# Patient Record
Sex: Male | Born: 1940 | Race: White | Hispanic: No | Smoking: Former smoker
Health system: Southern US, Community
[De-identification: ages and names within clinical notes are randomized; demographics above are authoritative.]

## PROBLEM LIST (undated history)

## (undated) DIAGNOSIS — I2510 Atherosclerotic heart disease of native coronary artery without angina pectoris: Secondary | ICD-10-CM

## (undated) DIAGNOSIS — E119 Type 2 diabetes mellitus without complications: Secondary | ICD-10-CM

## (undated) DIAGNOSIS — E785 Hyperlipidemia, unspecified: Secondary | ICD-10-CM

## (undated) DIAGNOSIS — I10 Essential (primary) hypertension: Secondary | ICD-10-CM

## (undated) DIAGNOSIS — I5030 Unspecified diastolic (congestive) heart failure: Secondary | ICD-10-CM

## (undated) HISTORY — PX: CORONARY ARTERY BYPASS GRAFT: SHX141

## (undated) HISTORY — PX: CATARACT EXTRACTION: SUR2

## (undated) HISTORY — PX: CHOLECYSTECTOMY: SHX55

## (undated) SURGERY — Surgical Case
Anesthesia: *Unknown

---

## 2006-06-08 ENCOUNTER — Emergency Department (HOSPITAL_COMMUNITY): Admission: EM | Admit: 2006-06-08 | Discharge: 2006-06-08 | Payer: Self-pay | Admitting: Emergency Medicine

## 2014-11-29 ENCOUNTER — Encounter: Admit: 2014-11-29 | Disposition: A | Discharge status: Home / Self Care | Payer: Self-pay

## 2014-12-26 ENCOUNTER — Encounter: Payer: Non-veteran care | Attending: General Practice | Admitting: *Deleted

## 2014-12-26 DIAGNOSIS — Z951 Presence of aortocoronary bypass graft: Secondary | ICD-10-CM | POA: Insufficient documentation

## 2014-12-26 NOTE — Progress Notes (Signed)
Daily Session Note  Patient Details  Name: Bradley [name] MRN: [id number] Date of Birth: 1940/12/20 [facility] Provider[facility]  Encounter Date: 12/26/2014  Check In:     Session Check In - 12/26/14 1126    Check-In   Staff Present Heath Lark RN, BSN, CCRP; Earlean Shawl BS, ACSM CEP, Exercise Physiologist; Candiss Norse MS, ACSM CEP, Exercise Physiologist   ER physicians immediately available to respond to emergencies See telemetry face sheet for immediately available ER MD   Warm-up and Cool-down Performed on first and last piece of equipment   VAD Patient? No   Pain Assessment   Currently in Pain? No/denies         Goals Met:  Independence with exercise equipment Exercise tolerated well  No cardiac symptoms reported with exercise.   Goals Unmet:  Not Applicable  Goals Comments:    Dr. Emily Filbert is Medical Director for [place] and [facility].

## 2014-12-28 ENCOUNTER — Encounter: Payer: Non-veteran care | Admitting: *Deleted

## 2014-12-28 DIAGNOSIS — Z951 Presence of aortocoronary bypass graft: Secondary | ICD-10-CM | POA: Diagnosis not present

## 2014-12-28 DIAGNOSIS — Z8249 Family history of ischemic heart disease and other diseases of the circulatory system: Secondary | ICD-10-CM

## 2014-12-28 NOTE — Progress Notes (Signed)
Daily Session Note  Patient Details  Name: Bradley [name] MRN: [id number] Date of Birth: 06-14-1941 Referring Provider:  [facility]  Encounter Date: 12/28/2014  Check In:     Session Check In - 12/28/14 1002    Check-In   Staff Present Heath Lark RN, BSN, CCRP; Remo Lipps Way BS, ACSM EP-C, Exercise Physiologist; Candiss Norse MS, ACSM CEP, Exercise Physiologist   ER physicians immediately available to respond to emergencies See telemetry face sheet for immediately available ER MD   Warm-up and Cool-down Performed on first and last piece of equipment   VAD Patient? No   Pain Assessment   Currently in Pain? No/denies   Multiple Pain Sites No         Goals Met:  Proper associated with RPD/PD & O2 Sat Independence with exercise equipment Exercise tolerated well  Goals Unmet:  Not Applicable  Goals Comments: No cardiac symptoms reported; daily exercise goals completed    Dr. Emily Filbert is Medical Director for [place] and [facility].

## 2015-01-15 ENCOUNTER — Encounter: Payer: Self-pay | Admitting: *Deleted

## 2015-01-15 NOTE — Progress Notes (Signed)
Documentation from previous EMR to update the Individualized Treatment Plan in [facility]. 

## 2015-01-17 ENCOUNTER — Encounter: Payer: Self-pay | Admitting: *Deleted

## 2015-01-17 NOTE — Progress Notes (Signed)
Cardiac Individual Treatment Plan  Patient Details  Name: Bradley [name] MRN: [id number] Date of Birth: [birth year] Referring Provider:  [facility] Initial Encounter Date:  11/29/2014 Diagnosis CABG  Patient's Home Medications on Admission: No current outpatient prescriptions on file.  Past Medical History: No past medical history on file.  Tobacco Use: History  Smoking status  . Not on file  Smokeless tobacco  . Not on file    Labs: Recent Review Flowsheet Data    There is no flowsheet data to display.       Exercise Target Goals:    Exercise Program Goal: Individual exercise prescription set with THRR, safety & activity barriers. Participant demonstrates ability to understand and report RPE using BORG scale, to self-measure pulse accurately, and to acknowledge the importance of the exercise prescription.  Exercise Prescription Goal: Starting with aerobic activity 30 plus minutes a day, 3 days per week for initial exercise prescription. Provide home exercise prescription and guidelines that participant acknowledges understanding prior to discharge.  Activity Barriers & Risk Stratification:   6 Minute Walk:   Initial Exercise Prescription:   Exercise Prescription Changes:     Exercise Prescription Changes      01/17/15 0700           Exercise Review   Progression No  Absent since last review          Discharge Exercise Prescription:   Nutrition:  Target Goals: Understanding of nutrition guidelines, daily intake of sodium 1500mg , cholesterol 200mg , calories 30% from fat and 7% or less from saturated fats, daily to have 5 or more servings of fruits and vegetables.  Biometrics:    Nutrition Therapy Plan and Nutrition Goals:   Nutrition Discharge: Rate Your Plate Scores:   Nutrition Goals Re-Evaluation:   Psychosocial: Target Goals: Acknowledge presence or absence of depression, maximize coping skills, provide positive support system. Participant  is able to verbalize types and ability to use techniques and skills needed for reducing stress and depression.  Initial Review & Psychosocial Screening:   Quality of Life Scores:   PHQ-9:     Recent Review Flowsheet Data    There is no flowsheet data to display.      Psychosocial Evaluation and Intervention:   Psychosocial Re-Evaluation:   Vocational Rehabilitation: Provide vocational rehab assistance to qualifying candidates.   Vocational Rehab Evaluation & Intervention:   Education: Education Goals: Education classes will be provided on a weekly basis, covering required topics. Participant will state understanding/return demonstration of topics presented.  Learning Barriers/Preferences:   Education Topics: General Nutrition Guidelines/Fats and Fiber: -Group instruction provided by verbal, written material, models and posters to present the general guidelines for heart healthy nutrition. Gives an explanation and review of dietary fats and fiber.   Controlling Sodium/Reading Food Labels: -Group verbal and written material supporting the discussion of sodium use in heart healthy nutrition. Review and explanation with models, verbal and written materials for utilization of the food label.   Exercise Physiology & Risk Factors: - Group verbal and written instruction with models to review the exercise physiology of the cardiovascular system and associated critical values. Details cardiovascular disease risk factors and the goals associated with each risk factor.   Aerobic Exercise & Resistance Training: - Gives group verbal and written discussion on the health impact of inactivity. On the components of aerobic and resistive training programs and the benefits of this training and how to safely progress through these programs.   Flexibility, Balance, General Exercise Guidelines: - Provides group  verbal and written instruction on the benefits of flexibility and balance  training programs. Provides general exercise guidelines with specific guidelines to those with heart or lung disease. Demonstration and skill practice provided.   Stress Management: - Provides group verbal and written instruction about the health risks of elevated stress, cause of high stress, and healthy ways to reduce stress.   Depression: - Provides group verbal and written instruction on the correlation between heart/lung disease and depressed mood, treatment options, and the stigmas associated with seeking treatment.   Anatomy & Physiology of the Heart: - Group verbal and written instruction and models provide basic cardiac anatomy and physiology, with the coronary electrical and arterial systems. Review of: AMI, Angina, Valve disease, Heart Failure, Cardiac Arrhythmia, Pacemakers, and the ICD.   Cardiac Procedures: - Group verbal and written instruction and models to describe the testing methods done to diagnose heart disease. Reviews the outcomes of the test results. Describes the treatment choices: Medical Management, Angioplasty, or Coronary Bypass Surgery.   Cardiac Medications: - Group verbal and written instruction to review commonly prescribed medications for heart disease. Reviews the medication, class of the drug, and side effects. Includes the steps to properly store meds and maintain the prescription regimen.   Go Sex-Intimacy & Heart Disease, Get SMART - Goal Setting: - Group verbal and written instruction through game format to discuss heart disease and the return to sexual intimacy. Provides group verbal and written material to discuss and apply goal setting through the application of the S.M.A.R.T. Method.   Other Matters of the Heart: - Provides group verbal, written materials and models to describe Heart Failure, Angina, Valve Disease, and Diabetes in the realm of heart disease. Includes description of the disease process and treatment options available to the  cardiac patient.   Exercise & Equipment Safety: - Individual verbal instruction and demonstration of equipment use and safety with use of the equipment.   Infection Prevention: - Provides verbal and written material to individual with discussion of infection control including proper hand washing and proper equipment cleaning during exercise session.   Falls Prevention: - Provides verbal and written material to individual with discussion of falls prevention and safety.   Diabetes: - Individual verbal and written instruction to review signs/symptoms of diabetes, desired ranges of glucose level fasting, after meals and with exercise. Advice that pre and post exercise glucose checks will be done for 3 sessions at entry of program.    Knowledge Questionnaire Score:   Personal Goals and Risk Factors at Admission:   Personal Goals and Risk Factors Review:    Personal Goals Discharge:     Comments: 30 day review  [facility]

## 2015-01-19 ENCOUNTER — Other Ambulatory Visit: Payer: Self-pay | Admitting: *Deleted

## 2015-01-19 DIAGNOSIS — Z951 Presence of aortocoronary bypass graft: Secondary | ICD-10-CM

## 2015-01-25 ENCOUNTER — Encounter: Payer: Non-veteran care | Attending: General Practice

## 2015-01-25 DIAGNOSIS — Z951 Presence of aortocoronary bypass graft: Secondary | ICD-10-CM | POA: Insufficient documentation

## 2015-02-13 ENCOUNTER — Encounter: Payer: Self-pay | Admitting: *Deleted

## 2015-02-14 ENCOUNTER — Encounter: Payer: Self-pay | Admitting: *Deleted

## 2015-02-14 DIAGNOSIS — Z951 Presence of aortocoronary bypass graft: Secondary | ICD-10-CM

## 2015-02-14 NOTE — Progress Notes (Signed)
Cardiac Individual Treatment Plan  Patient Details  Name: Bradley [name] MRN: [id number] Date of Birth: 1941/02/19 Referring Provider:  [facility] physician   Initial Encounter Date:   12/09/2014 Visit Diagnosis: S/P CABG (coronary artery bypass graft)  Patient's Home Medications on Admission: No current outpatient prescriptions on file.  Past Medical History: No past medical history on file.  Tobacco Use: History  Smoking status  . Not on file  Smokeless tobacco  . Not on file    Labs: Recent Review Flowsheet Data    There is no flowsheet data to display.       Exercise Target Goals:    Exercise Program Goal: Individual exercise prescription set with THRR, safety & activity barriers. Participant demonstrates ability to understand and report RPE using BORG scale, to self-measure pulse accurately, and to acknowledge the importance of the exercise prescription.  Exercise Prescription Goal: Starting with aerobic activity 30 plus minutes a day, 3 days per week for initial exercise prescription. Provide home exercise prescription and guidelines that participant acknowledges understanding prior to discharge.  Activity Barriers & Risk Stratification:     Activity Barriers & Risk Stratification - 01/15/15 1316    Activity Barriers & Risk Stratification   Activity Barriers Back Problems;Other (comment)   Comments Disc that cause numbness at times      6 Minute Walk:     6 Minute Walk      11/29/14 1317       6 Minute Walk   Phase Initial     Distance 1100 feet     Walk Time 5 minutes     Resting HR 59 bpm     Resting BP 140/68 mmHg     Max Ex. HR 90 bpm     Max Ex. BP 162/78 mmHg     RPE 11     Symptoms Yes (comment)     Comments No cardiac symptoms, had to stop and rest due to back pain  walk 5 min rested        Initial Exercise Prescription:   Exercise Prescription Changes:     Exercise Prescription Changes      02/13/15 1600           Exercise  Review   Progression No  Absent since last review          Discharge Exercise Prescription (Final Exercise Prescription Changes):     Exercise Prescription Changes - 02/13/15 1600    Exercise Review   Progression No  Absent since last review      Nutrition:  Target Goals: Understanding of nutrition guidelines, daily intake of sodium 1500mg , cholesterol 200mg , calories 30% from fat and 7% or less from saturated fats, daily to have 5 or more servings of fruits and vegetables.  Biometrics:     Pre Biometrics - 11/29/14 1320    Pre Biometrics   Height 5' 10.5" (1.791 m)   Weight 214 lb 12.8 oz (97.433 kg)   Waist Circumference 43 inches   Hip Circumference 40 inches   Waist to Hip Ratio 1.08 %   BMI (Calculated) 30.4       Nutrition Therapy Plan and Nutrition Goals:     Nutrition Therapy & Goals - 12/09/14 1318    Nutrition Therapy   Diet 1700 calories ADA   Drug/Food Interactions Statins/Certain Fruits   Fiber 30 grams   Whole Grain Foods 3 servings   Protein 8 ounces/day   Saturated Fats 11 max. grams   Fruits  and Vegetables 5 servings/day   Personal Nutrition Goals   Personal Goal #1 Increase intake of whole grains with oatmeal and choosing brown rice, corn, whole wheat bread when available "out"   Personal Goal #2 Read labels for saturated and trans fats      Nutrition Discharge: Rate Your Plate Scores:   Nutrition Goals Re-Evaluation:   Psychosocial: Target Goals: Acknowledge presence or absence of depression, maximize coping skills, provide positive support system. Participant is able to verbalize types and ability to use techniques and skills needed for reducing stress and depression.  Initial Review & Psychosocial Screening:   Quality of Life Scores:   PHQ-9:     Recent Review Flowsheet Data    There is no flowsheet data to display.      Psychosocial Evaluation and Intervention:   Psychosocial Re-Evaluation:   Vocational  Rehabilitation: Provide vocational rehab assistance to qualifying candidates.   Vocational Rehab Evaluation & Intervention:     Vocational Rehab - 01/15/15 1317    Initial Vocational Rehab Evaluation & Intervention   Assessment shows need for Vocational Rehabilitation No      Education: Education Goals: Education classes will be provided on a weekly basis, covering required topics. Participant will state understanding/return demonstration of topics presented.  Learning Barriers/Preferences:     Learning Barriers/Preferences - 01/15/15 1316    Learning Barriers/Preferences   Learning Barriers None   Learning Preferences None      Education Topics: General Nutrition Guidelines/Fats and Fiber: -Group instruction provided by verbal, written material, models and posters to present the general guidelines for heart healthy nutrition. Gives an explanation and review of dietary fats and fiber.   Controlling Sodium/Reading Food Labels: -Group verbal and written material supporting the discussion of sodium use in heart healthy nutrition. Review and explanation with models, verbal and written materials for utilization of the food label.   Exercise Physiology & Risk Factors: - Group verbal and written instruction with models to review the exercise physiology of the cardiovascular system and associated critical values. Details cardiovascular disease risk factors and the goals associated with each risk factor.   Aerobic Exercise & Resistance Training: - Gives group verbal and written discussion on the health impact of inactivity. On the components of aerobic and resistive training programs and the benefits of this training and how to safely progress through these programs.   Flexibility, Balance, General Exercise Guidelines: - Provides group verbal and written instruction on the benefits of flexibility and balance training programs. Provides general exercise guidelines with specific  guidelines to those with heart or lung disease. Demonstration and skill practice provided.   Stress Management: - Provides group verbal and written instruction about the health risks of elevated stress, cause of high stress, and healthy ways to reduce stress.   Depression: - Provides group verbal and written instruction on the correlation between heart/lung disease and depressed mood, treatment options, and the stigmas associated with seeking treatment.   Anatomy & Physiology of the Heart: - Group verbal and written instruction and models provide basic cardiac anatomy and physiology, with the coronary electrical and arterial systems. Review of: AMI, Angina, Valve disease, Heart Failure, Cardiac Arrhythmia, Pacemakers, and the ICD.   Cardiac Procedures: - Group verbal and written instruction and models to describe the testing methods done to diagnose heart disease. Reviews the outcomes of the test results. Describes the treatment choices: Medical Management, Angioplasty, or Coronary Bypass Surgery.   Cardiac Medications: - Group verbal and written instruction to review commonly  prescribed medications for heart disease. Reviews the medication, class of the drug, and side effects. Includes the steps to properly store meds and maintain the prescription regimen.   Go Sex-Intimacy & Heart Disease, Get SMART - Goal Setting: - Group verbal and written instruction through game format to discuss heart disease and the return to sexual intimacy. Provides group verbal and written material to discuss and apply goal setting through the application of the S.M.A.R.T. Method.   Other Matters of the Heart: - Provides group verbal, written materials and models to describe Heart Failure, Angina, Valve Disease, and Diabetes in the realm of heart disease. Includes description of the disease process and treatment options available to the cardiac patient.   Exercise & Equipment Safety: - Individual verbal  instruction and demonstration of equipment use and safety with use of the equipment.   Infection Prevention: - Provides verbal and written material to individual with discussion of infection control including proper hand washing and proper equipment cleaning during exercise session.   Falls Prevention: - Provides verbal and written material to individual with discussion of falls prevention and safety.   Diabetes: - Individual verbal and written instruction to review signs/symptoms of diabetes, desired ranges of glucose level fasting, after meals and with exercise. Advice that pre and post exercise glucose checks will be done for 3 sessions at entry of program.    Knowledge Questionnaire Score:   Personal Goals and Risk Factors at Admission:     Personal Goals and Risk Factors at Admission - 01/15/15 1321    Personal Goals and Risk Factors on Admission   Take Less Medication Yes   Intervention Learn your risk factors and begin the lifestyle modifications for risk factor control during your time in the program.   Diabetes Yes   Goal Blood glucose control identified by blood glucose values, HgbA1C. Participant verbalizes understanding of the signs/symptoms of hyper/hypo glycemia, proper foot care and importance of medication and nutrition plan for blood glucose control.   Intervention Provide nutrition & aerobic exercise along with prescribed medications to achieve blood glucose in normal ranges: Fasting 65-99 mg/dL   Hypertension Yes   Goal Participant will see blood pressure controlled within the values of 140/21mm/Hg or within value directed by their physician.   Intervention Provide nutrition & aerobic exercise along with prescribed medications to achieve BP 140/90 or less.   Lipids Yes   Goal Cholesterol controlled with medications as prescribed, with individualized exercise RX and with personalized nutrition plan. Value goals: LDL < 70mg , HDL > 40mg . Participant states  understanding of desired cholesterol values and following prescriptions.   Intervention Provide nutrition & aerobic exercise along with prescribed medications to achieve LDL 70mg , HDL >40mg .   Stress No      Personal Goals and Risk Factors Review:    Personal Goals Discharge:     Comments: 30 day review. Continue with ITP.   Bradley [name] is out waiting for authorization from [facility] for more visits.

## 2015-02-15 ENCOUNTER — Other Ambulatory Visit: Payer: Self-pay | Admitting: *Deleted

## 2015-02-15 DIAGNOSIS — Z951 Presence of aortocoronary bypass graft: Secondary | ICD-10-CM

## 2015-02-22 DIAGNOSIS — Z951 Presence of aortocoronary bypass graft: Secondary | ICD-10-CM

## 2015-02-22 NOTE — Progress Notes (Signed)
Daily Session Note  Patient Details  Name: Markail Diekman MRN: [id number] Date of Birth: Nov 10, 1940 [facility] Provider[facility]  [facility]  Encounter Date: 02/22/2015  Check In:     Session Check In - 02/22/15 0843    Check-In   Staff Present Heath Lark RN, BSN, CCRP;Izael Bessinger BS, ACSM EP-C, Exercise Physiologist;Renee Dillard Essex MS, ACSM CEP Exercise Physiologist   ER physicians immediately available to respond to emergencies See telemetry face sheet for immediately available ER MD   Medication changes reported     No   Fall or balance concerns reported    No   Warm-up and Cool-down Performed on first and last piece of equipment   VAD Patient? No   Pain Assessment   Currently in Pain? No/denies         Goals Met:  Proper associated with RPD/PD & O2 Sat Exercise tolerated well Personal goals reviewed Strength training completed today  Goals Unmet:  Not Applicable  Goals Comments:   Dr. Emily Filbert is Medical Director for [place] and [facility].

## 2015-02-24 ENCOUNTER — Encounter: Payer: Non-veteran care | Attending: General Practice | Admitting: *Deleted

## 2015-02-24 DIAGNOSIS — Z951 Presence of aortocoronary bypass graft: Secondary | ICD-10-CM

## 2015-02-24 NOTE — Progress Notes (Signed)
Daily Session Note  Patient Details  Name: Bradley [name] MRN: [id number] Date of Birth: 04/23/1941 Referring Provider:  [facility]  Encounter Date: 02/24/2015  Check In:     Session Check In - 02/24/15 0911    Check-In   Staff Present Heath Lark RN, BSN, CCRP;Renee Dillard Essex MS, ACSM CEP Exercise Physiologist;Carroll Enterkin RN, BSN   ER physicians immediately available to respond to emergencies See telemetry face sheet for immediately available ER MD   Medication changes reported     No   Fall or balance concerns reported    No   Warm-up and Cool-down Performed on first and last piece of equipment   VAD Patient? No   Pain Assessment   Currently in Pain? No/denies         Goals Met:  Independence with exercise equipment Exercise tolerated well No report of cardiac concerns or symptoms Strength training completed today  Goals Unmet:  Not Applicable  Goals Comments: Working well with exercise progression.   Dr. Emily Filbert is Medical Director for [place] and [facility].

## 2015-02-28 ENCOUNTER — Encounter: Payer: Self-pay | Admitting: *Deleted

## 2015-03-01 DIAGNOSIS — Z951 Presence of aortocoronary bypass graft: Secondary | ICD-10-CM | POA: Diagnosis not present

## 2015-03-01 NOTE — Progress Notes (Signed)
Daily Session Note  Patient Details  Name: Bradley [name] MRN: [id number] Date of Birth: 1941-01-05 Ref[facility] Provider:  [facility]  Encounter Date: 03/01/2015  Check In:     Session Check In - 03/01/15 2761    Check-In   Staff Present Heath Lark RN, BSN, CCRP;Kyndra Condron BS, ACSM EP-C, Exercise Physiologist;Renee Dillard Essex MS, ACSM CEP Exercise Physiologist   ER physicians immediately available to respond to emergencies See telemetry face sheet for immediately available ER MD   Medication changes reported     No   Fall or balance concerns reported    No   Warm-up and Cool-down Performed on first and last piece of equipment   VAD Patient? No   Pain Assessment   Currently in Pain? No/denies         Goals Met:  Proper associated with RPD/PD & O2 Sat Exercise tolerated well No report of cardiac concerns or symptoms Strength training completed today  Goals Unmet:  Not Applicable  Goals Comments:    Dr. Emily Filbert is Medical Director for [place] and [facility].

## 2015-03-03 ENCOUNTER — Encounter: Payer: Non-veteran care | Admitting: *Deleted

## 2015-03-03 DIAGNOSIS — Z951 Presence of aortocoronary bypass graft: Secondary | ICD-10-CM | POA: Diagnosis not present

## 2015-03-03 NOTE — Progress Notes (Signed)
Daily Session Note  Patient Details  Name: Bradley [name] MRN: [id number] Date of Birth: 1941-07-24 Referring Provider:  [facility]  Encounter Date: 03/03/2015  Check In:     Session Check In - 03/03/15 0900    Check-In   Staff Present Diane Joya Gaskins RN, BSN;Stacey Blanch Media RRT, RCP Respiratory Therapist;Celia Gibbons Dillard Essex MS, ACSM CEP Exercise Physiologist   ER physicians immediately available to respond to emergencies See telemetry face sheet for immediately available ER MD   Medication changes reported     No   Fall or balance concerns reported    No   Warm-up and Cool-down Performed on first and last piece of equipment   VAD Patient? No   Pain Assessment   Currently in Pain? No/denies   Multiple Pain Sites No         Goals Met:  Independence with exercise equipment Exercise tolerated well Personal goals reviewed No report of cardiac concerns or symptoms Strength training completed today  Goals Unmet:  Not Applicable  Goals Comments:   Dr. Emily Filbert is Medical Director for [place] and [facility].

## 2015-03-06 ENCOUNTER — Encounter: Payer: Non-veteran care | Admitting: *Deleted

## 2015-03-06 DIAGNOSIS — Z951 Presence of aortocoronary bypass graft: Secondary | ICD-10-CM | POA: Diagnosis not present

## 2015-03-06 NOTE — Progress Notes (Signed)
Daily Session Note  Patient Details  Name: Bradley [name] MRN: [id number] Date of Birth: [birth year] Referring Provider:  [facility] [place] [facility]  Encounter Date: 03/06/2015  Check In:     Session Check In - 03/06/15 0827    Check-In   Staff Present Candiss Norse MS, ACSM CEP Exercise Physiologist;Eliezer Khawaja Alfonso Patten, ACSM CEP Exercise Physiologist;Susanne Bice RN, BSN, [place]   ER physicians immediately available to respond to emergencies See telemetry face sheet for immediately available ER MD   Medication changes reported     No   Fall or balance concerns reported    No   Warm-up and Cool-down Performed on first and last piece of equipment   VAD Patient? No   Pain Assessment   Currently in Pain? No/denies   Multiple Pain Sites No         Goals Met:  Independence with exercise equipment Exercise tolerated well No report of cardiac concerns or symptoms  Goals Unmet:  Not Applicable  Goals Comments:    Dr. Emily Filbert is Medical Director for [place] and [facility].

## 2015-03-08 DIAGNOSIS — Z951 Presence of aortocoronary bypass graft: Secondary | ICD-10-CM

## 2015-03-08 NOTE — Progress Notes (Signed)
Daily Session Note  Patient Details  Name: Nayef College MRN: [id number] Date of Birth: 10/25/1940 Referring Provider:  [facility] [place] [facility]  Encounter Date: 03/08/2015  Check In:     Session Check In - 03/08/15 0827    Check-In   Staff Present Heath Lark RN, BSN, CCRP;Davionne Dowty BS, ACSM EP-C, Exercise Physiologist;Renee Dillard [place] MS, ACSM CEP Exercise Physiologist   ER physicians immediately available to respond to emergencies See telemetry face sheet for immediately available ER MD   Medication changes reported     No   Fall or balance concerns reported    No   Warm-up and Cool-down Performed on first and last piece of equipment   VAD Patient? No   Pain Assessment   Currently in Pain? No/denies         Goals Met:  Proper associated with RPD/PD & O2 Sat Exercise tolerated well No report of cardiac concerns or symptoms Strength training completed today  Goals Unmet:  Not Applicable  Goals Comments:    Dr. Emily Filbert is Medical Director for [place] and [facility].

## 2015-03-10 ENCOUNTER — Encounter: Payer: Non-veteran care | Admitting: *Deleted

## 2015-03-10 DIAGNOSIS — Z951 Presence of aortocoronary bypass graft: Secondary | ICD-10-CM | POA: Diagnosis not present

## 2015-03-10 NOTE — Progress Notes (Signed)
Daily Session Note  Patient Details  Name: Bradley [name] MRN: [id number] Date of Birth: 1941-04-08 Referring Provider:  [facility] [place] [facility]  Encounter Date: 03/10/2015  Check In:     Session Check In - 03/10/15 7215    Check-In   Staff Present Heath Lark RN, BSN, CCRP;Carroll Enterkin RN, Drusilla Kanner MS, ACSM CEP Exercise Physiologist   ER physicians immediately available to respond to emergencies See telemetry face sheet for immediately available ER MD   Medication changes reported     No   Fall or balance concerns reported    No   Warm-up and Cool-down Performed on first and last piece of equipment   VAD Patient? No   Pain Assessment   Currently in Pain? No/denies         Goals Met:  Independence with exercise equipment Exercise tolerated well No report of cardiac concerns or symptoms Strength training completed today  Goals Unmet:  Not Applicable  Goals Comments: JW continues to do well with the exercise. He is limited because of of a "bad back".   Dr. Emily Filbert is Medical Director for [place] and [facility].

## 2015-03-13 DIAGNOSIS — Z951 Presence of aortocoronary bypass graft: Secondary | ICD-10-CM | POA: Diagnosis not present

## 2015-03-13 NOTE — Progress Notes (Signed)
Daily Session Note  Patient Details  Name: Bradley [name] MRN: [id number] Date of Birth: [birth year] Referring Provider:  [facility] [facility]  Encounter Date: 03/13/2015  Check In:     Session Check In - 03/13/15 1153    Check-In   Staff Present Heath Lark RN, BSN, CCRP;Kelly Hayes BS, ACSM CEP Exercise Physiologist;Renee Covington MS, ACSM CEP Exercise Physiologist   ER physicians immediately available to respond to emergencies See telemetry face sheet for immediately available ER MD   Medication changes reported     No   Fall or balance concerns reported    No   Warm-up and Cool-down Performed on first and last piece of equipment   VAD Patient? No   Pain Assessment   Currently in Pain? No/denies           Exercise Prescription Changes - 03/13/15 1100    Interval Training   Interval Training Yes   Equipment REL-XR   Comments L6-7 80-110w      Goals Met:  Independence with exercise equipment Exercise tolerated well No report of cardiac concerns or symptoms Strength training completed today  Goals Unmet:  Not Applicable  Goals Comments: Progressing well with his exercise prescription.    Dr. Emily Filbert is Medical Director for [place] and [facility].

## 2015-03-14 ENCOUNTER — Encounter: Payer: Self-pay | Admitting: *Deleted

## 2015-03-14 DIAGNOSIS — Z951 Presence of aortocoronary bypass graft: Secondary | ICD-10-CM

## 2015-03-14 NOTE — Progress Notes (Signed)
Cardiac Individual Treatment Plan  Patient Details  Name: Bradley [name] MRN: [id number] Date of Birth: Jan 02, 1941 Referring Provider:  No ref. provider found  Initial Encounter Date:    Visit Diagnosis: S/P CABG x 3  Patient's Home Medications on Admission: No current outpatient prescriptions on file.  Past Medical History: No past medical history on file.  Tobacco Use: History  Smoking status  . Not on file  Smokeless tobacco  . Not on file    Labs: Recent Review Flowsheet Data    There is no flowsheet data to display.       Exercise Target Goals:    Exercise Program Goal: Individual exercise prescription set with THRR, safety & activity barriers. Participant demonstrates ability to understand and report RPE using BORG scale, to self-measure pulse accurately, and to acknowledge the importance of the exercise prescription.  Exercise Prescription Goal: Starting with aerobic activity 30 plus minutes a day, 3 days per week for initial exercise prescription. Provide home exercise prescription and guidelines that participant acknowledges understanding prior to discharge.  Activity Barriers & Risk Stratification:   6 Minute Walk:   Initial Exercise Prescription:   Exercise Prescription Changes:     Exercise Prescription Changes      01/17/15 0700 03/13/15 1100 03/14/15 1300       Exercise Review   Progression No  Absent since last review  Yes     Response to Exercise   Blood Pressure (Admit)   150/80 mmHg     Blood Pressure (Exercise)   172/80 mmHg     Blood Pressure (Exit)   140/84 mmHg     Heart Rate (Admit)   81 bpm     Heart Rate (Exercise)   117 bpm     Heart Rate (Exit)   65 bpm     Rating of Perceived Exertion (Exercise)   13     Duration   Progress to 30 minutes of continuous aerobic without signs/symptoms of physical distress     Intensity   Rest + 30     Progression   Continue progressive overload as per policy without signs/symptoms or physical  distress.     Interval Training   Interval Training  Yes Yes     Equipment  REL-XR REL-XR     Comments  L6-7 80-110w L6-7 80-110w     REL-XR   Level   7     Watts   110     Minutes   30        Discharge Exercise Prescription (Final Exercise Prescription Changes):     Exercise Prescription Changes - 03/14/15 1300    Exercise Review   Progression Yes   Response to Exercise   Blood Pressure (Admit) 150/80 mmHg   Blood Pressure (Exercise) 172/80 mmHg   Blood Pressure (Exit) 140/84 mmHg   Heart Rate (Admit) 81 bpm   Heart Rate (Exercise) 117 bpm   Heart Rate (Exit) 65 bpm   Rating of Perceived Exertion (Exercise) 13   Duration Progress to 30 minutes of continuous aerobic without signs/symptoms of physical distress   Intensity Rest + 30   Progression Continue progressive overload as per policy without signs/symptoms or physical distress.   Interval Training   Interval Training Yes   Equipment REL-XR   Comments L6-7 80-110w   REL-XR   Level 7   Watts 110   Minutes 30      Nutrition:  Target Goals: Understanding of nutrition guidelines, daily intake of sodium <  1500mg , cholesterol 200mg , calories 30% from fat and 7% or less from saturated fats, daily to have 5 or more servings of fruits and vegetables.  Biometrics:    Nutrition Therapy Plan and Nutrition Goals:     Nutrition Therapy & Goals - 03/03/15 0943    Personal Nutrition Goals   Personal Goal #1 Has started eating more salads and is more conscious of fat and sodium in the diet      Nutrition Discharge: Rate Your Plate Scores:   Nutrition Goals Re-Evaluation:   Psychosocial: Target Goals: Acknowledge presence or absence of depression, maximize coping skills, provide positive support system. Participant is able to verbalize types and ability to use techniques and skills needed for reducing stress and depression.  Initial Review & Psychosocial Screening:   Quality of Life Scores:   PHQ-9:      Recent Review Flowsheet Data    There is no flowsheet data to display.      Psychosocial Evaluation and Intervention:   Psychosocial Re-Evaluation:   Vocational Rehabilitation: Provide vocational rehab assistance to qualifying candidates.   Vocational Rehab Evaluation & Intervention:   Education: Education Goals: Education classes will be provided on a weekly basis, covering required topics. Participant will state understanding/return demonstration of topics presented.  Learning Barriers/Preferences:   Education Topics: General Nutrition Guidelines/Fats and Fiber: -Group instruction provided by verbal, written material, models and posters to present the general guidelines for heart healthy nutrition. Gives an explanation and review of dietary fats and fiber.   Controlling Sodium/Reading Food Labels: -Group verbal and written material supporting the discussion of sodium use in heart healthy nutrition. Review and explanation with models, verbal and written materials for utilization of the food label.   Exercise Physiology & Risk Factors: - Group verbal and written instruction with models to review the exercise physiology of the cardiovascular system and associated critical values. Details cardiovascular disease risk factors and the goals associated with each risk factor.      Cardiac Rehab from 03/13/2015 in [facility] Cardiac Rehab   Date  03/08/15   Educator  RM   Instruction Review Code  2- meets goals/outcomes      Aerobic Exercise & Resistance Training: - Gives group verbal and written discussion on the health impact of inactivity. On the components of aerobic and resistive training programs and the benefits of this training and how to safely progress through these programs.      Cardiac Rehab from 03/13/2015 in [facility] Cardiac Rehab   Date  03/13/15   Educator  rm   Instruction Review Code  2- meets goals/outcomes      Flexibility, Balance, General Exercise Guidelines: -  Provides group verbal and written instruction on the benefits of flexibility and balance training programs. Provides general exercise guidelines with specific guidelines to those with heart or lung disease. Demonstration and skill practice provided.   Stress Management: - Provides group verbal and written instruction about the health risks of elevated stress, cause of high stress, and healthy ways to reduce stress.   Depression: - Provides group verbal and written instruction on the correlation between heart/lung disease and depressed mood, treatment options, and the stigmas associated with seeking treatment.      Cardiac Rehab from 03/13/2015 in [facility] Cardiac Rehab   Date  02/22/15   Educator  [facility]   Instruction Review Code  2- meets goals/outcomes      Anatomy & Physiology of the Heart: - Group verbal and written instruction and models provide basic  cardiac anatomy and physiology, with the coronary electrical and arterial systems. Review of: AMI, Angina, Valve disease, Heart Failure, Cardiac Arrhythmia, Pacemakers, and the ICD.   Cardiac Procedures: - Group verbal and written instruction and models to describe the testing methods done to diagnose heart disease. Reviews the outcomes of the test results. Describes the treatment choices: Medical Management, Angioplasty, or Coronary Bypass Surgery.      Cardiac Rehab from 03/13/2015 in [facility] Cardiac Rehab   Date  03/01/15   Educator  SB   Instruction Review Code  2- meets goals/outcomes      Cardiac Medications: - Group verbal and written instruction to review commonly prescribed medications for heart disease. Reviews the medication, class of the drug, and side effects. Includes the steps to properly store meds and maintain the prescription regimen.      Cardiac Rehab from 03/13/2015 in [facility] Cardiac Rehab   Date  03/06/15   Educator  SB   Instruction Review Code  2- meets goals/outcomes      Go Sex-Intimacy & Heart Disease, Get SMART -  Goal Setting: - Group verbal and written instruction through game format to discuss heart disease and the return to sexual intimacy. Provides group verbal and written material to discuss and apply goal setting through the application of the S.M.A.R.T. Method.      Cardiac Rehab from 03/13/2015 in [facility] Cardiac Rehab   Date  03/01/15   Educator  SB   Instruction Review Code  2- meets goals/outcomes      Other Matters of the Heart: - Provides group verbal, written materials and models to describe Heart Failure, Angina, Valve Disease, and Diabetes in the realm of heart disease. Includes description of the disease process and treatment options available to the cardiac patient.   Exercise & Equipment Safety: - Individual verbal instruction and demonstration of equipment use and safety with use of the equipment.   Infection Prevention: - Provides verbal and written material to individual with discussion of infection control including proper hand washing and proper equipment cleaning during exercise session.   Falls Prevention: - Provides verbal and written material to individual with discussion of falls prevention and safety.   Diabetes: - Individual verbal and written instruction to review signs/symptoms of diabetes, desired ranges of glucose level fasting, after meals and with exercise. Advice that pre and post exercise glucose checks will be done for 3 sessions at entry of program.    Knowledge Questionnaire Score:   Personal Goals and Risk Factors at Admission:   Personal Goals and Risk Factors Review:      Goals and Risk Factor Review      03/03/15 0943           Increase Aerobic Exercise and Physical Activity   Goals Progress/Improvement seen  Yes       Comments Has increased levels on the reucmbent elliptical, cannot do certain machines because of his back. Feels stronger.        Understand more about Heart/Pulmonary Disease   Goals Progress/Improvement seen  Yes        Comments Education has been very helpful, is really enjoying the program          Personal Goals Discharge:     Comments: 30 day review. Continue with ITP.

## 2015-03-15 ENCOUNTER — Other Ambulatory Visit: Payer: Self-pay | Admitting: *Deleted

## 2015-03-15 DIAGNOSIS — Z951 Presence of aortocoronary bypass graft: Secondary | ICD-10-CM

## 2015-03-15 NOTE — Progress Notes (Signed)
Daily Session Note  Patient Details  Name: Bradley [name] MRN: [id number] Date of Birth: 1941-07-02 Referring Provider:  [facility] [place] [facility]  Encounter Date: 03/15/2015  Check In:     Session Check In - 03/15/15 0900    Check-In   Staff Present Candiss Norse MS, ACSM CEP Exercise Physiologist;Steven Way BS, ACSM EP-C, Exercise Physiologist;Carroll Enterkin RN, BSN   ER physicians immediately available to respond to emergencies See telemetry face sheet for immediately available ER MD   Medication changes reported     No   Fall or balance concerns reported    No   Warm-up and Cool-down Performed on first and last piece of equipment   VAD Patient? No   Pain Assessment   Currently in Pain? No/denies   Multiple Pain Sites No           Exercise Prescription Changes - 03/14/15 1300    Exercise Review   Progression Yes   Response to Exercise   Blood Pressure (Admit) 150/80 mmHg   Blood Pressure (Exercise) 172/80 mmHg   Blood Pressure (Exit) 140/84 mmHg   Heart Rate (Admit) 81 bpm   Heart Rate (Exercise) 117 bpm   Heart Rate (Exit) 65 bpm   Rating of Perceived Exertion (Exercise) 13   Duration Progress to 30 minutes of continuous aerobic without signs/symptoms of physical distress   Intensity Rest + 30   Progression Continue progressive overload as per policy without signs/symptoms or physical distress.   Interval Training   Interval Training Yes   Equipment REL-XR   Comments L6-7 80-110w   REL-XR   Level 7   Watts 110   Minutes 30      Goals Met:  Independence with exercise equipment Exercise tolerated well Personal goals reviewed No report of cardiac concerns or symptoms Strength training completed today  Goals Unmet:  Not Applicable  Goals Comments: [name] Was feeling more energetic today and went up on his XR intervals to Level 5-6. He said he could tell a difference in the difficulty but it was manageable and it felt good to work hard.    Dr. Emily Filbert is Medical Director for [place] and [facility].

## 2015-03-17 ENCOUNTER — Encounter: Payer: Non-veteran care | Admitting: *Deleted

## 2015-03-17 DIAGNOSIS — Z951 Presence of aortocoronary bypass graft: Secondary | ICD-10-CM

## 2015-03-17 NOTE — Progress Notes (Unsigned)
Daily Session Note  Patient Details  Name: Bradley [name] MRN: [id number] Date of Birth: [birth year] Referring Provider:  [facility] [place] [facility]  Encounter Date: 03/17/2015  Check In:     Session Check In - 03/17/15 1028    Check-In   Staff Present Heath Lark RN, BSN, CCRP;Duante Arocho RN, BSN  Hessie Knows   ER physicians immediately available to respond to emergencies See telemetry face sheet for immediately available ER MD   Medication changes reported     No   Fall or balance concerns reported    No   Warm-up and Cool-down Performed on first and last piece of equipment   VAD Patient? No   Pain Assessment   Currently in Pain? No/denies         Goals Met:  Proper associated with RPD/PD & O2 Sat Exercise tolerated well  Goals Unmet:  Not Applicable  Goals Comments: Doing well in Cardiac Rehab. Almost done with Cardiac Rehab.    Dr. Emily Filbert is Medical Director for [place] and [facility].

## 2015-03-20 ENCOUNTER — Encounter: Payer: Non-veteran care | Admitting: *Deleted

## 2015-03-20 VITALS — Ht 70.5 in | Wt 214.8 lb

## 2015-03-20 DIAGNOSIS — Z951 Presence of aortocoronary bypass graft: Secondary | ICD-10-CM | POA: Diagnosis not present

## 2015-03-20 NOTE — Progress Notes (Signed)
Daily Session Note  Patient Details  Name: Bradley [name] MRN: [id number] Date of Birth: [birth year] Referring Provider:  [facility] [place] [facility]  Encounter Date: 03/20/2015  Check In:     Session Check In - 03/20/15 0824    Check-In   Staff Present Candiss Norse MS, ACSM CEP Exercise Physiologist;Kelly Alfonso Patten, ACSM CEP Exercise Physiologist;Susanne Bice RN, BSN, [place]   ER physicians immediately available to respond to emergencies See telemetry face sheet for immediately available ER MD   Medication changes reported     No   Fall or balance concerns reported    No   Warm-up and Cool-down Performed on first and last piece of equipment   VAD Patient? No   Pain Assessment   Currently in Pain? No/denies   Multiple Pain Sites No         Goals Met:  Independence with exercise equipment Exercise tolerated well No report of cardiac concerns or symptoms Strength training completed today  Goals Unmet:  Not Applicable  Goals Comments:   Dr. Emily Filbert is Medical Director for [place] and [facility].

## 2015-03-22 ENCOUNTER — Encounter: Payer: Self-pay | Admitting: *Deleted

## 2015-03-22 DIAGNOSIS — Z951 Presence of aortocoronary bypass graft: Secondary | ICD-10-CM

## 2015-03-22 NOTE — Progress Notes (Signed)
Daily Session Note  Patient Details  Name: Bradley [name] MRN: [id number] Date of Birth: Nov 15, 1940 Referring Provider:  [facility] [place] [facility]  Encounter Date: 03/22/2015  Check In:     Session Check In - 03/22/15 0826    Check-In   Staff Present Heath Lark RN, BSN, CCRP;Betina Puckett BS, ACSM EP-C, Exercise Physiologist;Renee Dillard Essex MS, ACSM CEP Exercise Physiologist   ER physicians immediately available to respond to emergencies See telemetry face sheet for immediately available ER MD   Medication changes reported     No   Fall or balance concerns reported    No   Warm-up and Cool-down Performed on first and last piece of equipment   VAD Patient? No   Pain Assessment   Currently in Pain? No/denies         Goals Met:  Proper associated with RPD/PD & O2 Sat Exercise tolerated well No report of cardiac concerns or symptoms Strength training completed today  Goals Unmet:  Not Applicable  Goals Comments:    Dr. Emily Filbert is Medical Director for [place] and [facility].

## 2015-03-22 NOTE — Progress Notes (Signed)
Discharge Summary  Patient Details  Name: Bradley [name] MRN: [id number] Date of Birth: 1941/04/27 Referring Provider:  No ref. provider found   Number of Visits: 36   Reason for Discharge:  Patient reached a stable level of exercise. Patient independent in their exercise.  Smoking History:  History  Smoking status  . Not on file  Smokeless tobacco  . Not on file    Diagnosis:  S/P CABG (coronary artery bypass graft)  ADL UCSD:   Initial Exercise Prescription:   Discharge Exercise Prescription (Final Exercise Prescription Changes):     Exercise Prescription Changes - 03/20/15 0900    Exercise Review   Progression Yes   Response to Exercise   Blood Pressure (Admit) 150/80 mmHg   Blood Pressure (Exercise) 172/80 mmHg   Blood Pressure (Exit) 140/84 mmHg   Heart Rate (Admit) 81 bpm   Heart Rate (Exercise) 117 bpm   Heart Rate (Exit) 65 bpm   Rating of Perceived Exertion (Exercise) 13   Duration Progress to 30 minutes of continuous aerobic without signs/symptoms of physical distress   Intensity Rest + 30   Progression Continue progressive overload as per policy without signs/symptoms or physical distress.   Resistance Training   Training Prescription Yes   Weight 4   Reps 10-15   Interval Training   Interval Training Yes   Equipment REL-XR   Comments L6-7 80-110w   REL-XR   Level 7   Watts 110   Minutes 30   Home Exercise Plan   Plans to continue exercise at [facility] (comment)  [name]      Functional Capacity:     6 Minute Walk      03/20/15 [phone] 03/20/15 0934     6 Minute Walk   Phase Initial Discharge    Distance 1100 feet 820 feet    Distance % Change  -34 %    Walk Time 5 minutes 3.5 minutes    Resting HR 59 bpm 66 bpm    Resting BP 140/68 mmHg 120/64 mmHg    Max Ex. HR 90 bpm 79 bpm    Max Ex. BP 162/78 mmHg 140/58 mmHg    RPE 11 11    Symptoms Yes (comment) Yes (comment)    Comments Back pain. no cardiac  symptoms  back pain       Psychological, QOL, Others - Outcomes: PHQ 2/9: Depression screen PHQ 2/9 03/17/2015  Decreased Interest 0  Down, Depressed, Hopeless 0  PHQ - 2 Score 0  Altered sleeping 0  Tired, decreased energy 0  Change in appetite 0  Feeling bad or failure about yourself  0  Trouble concentrating 0  Moving slowly or fidgety/restless 1  Suicidal thoughts 0  PHQ-9 Score 1  Difficult doing work/chores Not difficult at all    Quality of Life:     Quality of Life - 03/17/15 1033    Quality of Life Scores   Health/Function Post 28.93 %   Socioeconomic Post 27.83 %   Psych/Spiritual Post 30 %   Family Post 28.75 %   GLOBAL Post 28.95 %      Personal Goals: Goals established at orientation with interventions provided to work toward goal.    Personal Goals Discharge:   Nutrition & Weight - Outcomes:     Pre Biometrics - 03/20/15 0835    Pre Biometrics   Height 5' 10.5" (1.791 m)   Weight 214 lb 12.8 oz (97.433 kg)   Waist Circumference  43 inches   Hip Circumference 40 inches   Waist to Hip Ratio 1.08 %   BMI (Calculated) 30.4       Nutrition:     Nutrition Therapy & Goals - 03/03/15 0943    Personal Nutrition Goals   Personal Goal #1 Has started eating more salads and is more conscious of fat and sodium in the diet      Nutrition Discharge:     Rate Your Plate - 16/10/96 1028    Rate Your Plate Scores   Post Score 75   Post Score % 83 %      Education Questionnaire Score:     Knowledge Questionnaire Score - 03/17/15 1026    Knowledge Questionnaire Score   Post Score 22      Goals reviewed with patient; copy given to patient.

## 2015-03-22 NOTE — Patient Instructions (Signed)
Discharge Instructions  Patient Details  Name: Bradley [name] MRN: [id number] Date of Birth: 1940-12-07 Referring Provider:  No ref. provider found   Number of Visits: 36  Reason for Discharge:  Patient reached a stable level of exercise. Patient independent in their exercise.  Smoking History:  History  Smoking status  . Not on file  Smokeless tobacco  . Not on file    Diagnosis:  S/P CABG (coronary artery bypass graft)  Initial Exercise Prescription:   Discharge Exercise Prescription (Final Exercise Prescription Changes):     Exercise Prescription Changes - 03/20/15 0900    Exercise Review   Progression Yes   Response to Exercise   Blood Pressure (Admit) 150/80 mmHg   Blood Pressure (Exercise) 172/80 mmHg   Blood Pressure (Exit) 140/84 mmHg   Heart Rate (Admit) 81 bpm   Heart Rate (Exercise) 117 bpm   Heart Rate (Exit) 65 bpm   Rating of Perceived Exertion (Exercise) 13   Duration Progress to 30 minutes of continuous aerobic without signs/symptoms of physical distress   Intensity Rest + 30   Progression Continue progressive overload as per policy without signs/symptoms or physical distress.   Resistance Training   Training Prescription Yes   Weight 4   Reps 10-15   Interval Training   Interval Training Yes   Equipment REL-XR   Comments L6-7 80-110w   REL-XR   Level 7   Watts 110   Minutes 30   Home Exercise Plan   Plans to continue exercise at [facility] (comment)  [name]      Functional Capacity:     6 Minute Walk      03/20/15 [phone] 03/20/15 0934     6 Minute Walk   Phase Initial Discharge    Distance 1100 feet 820 feet    Distance % Change  -34 %    Walk Time 5 minutes 3.5 minutes    Resting HR 59 bpm 66 bpm    Resting BP 140/68 mmHg 120/64 mmHg    Max Ex. HR 90 bpm 79 bpm    Max Ex. BP 162/78 mmHg 140/58 mmHg    RPE 11 11    Symptoms Yes (comment) Yes (comment)    Comments Back pain. no cardiac symptoms  back  pain       Quality of Life:     Quality of Life - 03/17/15 1033    Quality of Life Scores   Health/Function Post 28.93 %   Socioeconomic Post 27.83 %   Psych/Spiritual Post 30 %   Family Post 28.75 %   GLOBAL Post 28.95 %      Personal Goals: Goals established at orientation with interventions provided to work toward goal.    Personal Goals Discharge:   Nutrition & Weight - Outcomes:     Pre Biometrics - 03/20/15 0835    Pre Biometrics   Height 5' 10.5" (1.791 m)   Weight 214 lb 12.8 oz (97.433 kg)   Waist Circumference 43 inches   Hip Circumference 40 inches   Waist to Hip Ratio 1.08 %   BMI (Calculated) 30.4       Nutrition:     Nutrition Therapy & Goals - 03/03/15 0943    Personal Nutrition Goals   Personal Goal #1 Has started eating more salads and is more conscious of fat and sodium in the diet      Nutrition Discharge:     Rate Your Plate - 11/91/47 1028  Rate Your Plate Scores   Post Score 75   Post Score % 83 %      Education Questionnaire Score:     Knowledge Questionnaire Score - 03/17/15 1026    Knowledge Questionnaire Score   Post Score 22      JW, Thank you for taking time to attend Cardiac Rehab. Please continue to use the exercise prescription daily. You have done a great job.

## 2015-03-22 NOTE — Progress Notes (Signed)
Cardiac Individual Treatment Plan  Patient Details  Name: Bradley [name] MRN: [id number] Date of Birth: October 14, 1940 Referring Provider:  No ref. provider found  Initial Encounter Date:    Visit Diagnosis: S/P CABG (coronary artery bypass graft)  Patient's Home Medications on Admission:  Current outpatient prescriptions:  .  amLODipine (NORVASC) 10 MG tablet, Take by mouth., Disp: , Rfl:  .  aspirin 81 MG chewable tablet, Chew by mouth., Disp: , Rfl:  .  clopidogrel (PLAVIX) 75 MG tablet, Take by mouth., Disp: , Rfl:  .  furosemide (LASIX) 20 MG tablet, Take by mouth., Disp: , Rfl:  .  glipiZIDE (GLUCOTROL) 5 MG tablet, Take by mouth., Disp: , Rfl:  .  hydrochlorothiazide (HYDRODIURIL) 25 MG tablet, Take by mouth., Disp: , Rfl:  .  indomethacin (INDOCIN) 50 MG capsule, Take by mouth., Disp: , Rfl:  .  isosorbide mononitrate (IMDUR) 60 MG 24 hr tablet, Take by mouth., Disp: , Rfl:  .  lisinopril (PRINIVIL,ZESTRIL) 40 MG tablet, Take by mouth., Disp: , Rfl:  .  metFORMIN (GLUCOPHAGE) 1000 MG tablet, Take by mouth., Disp: , Rfl:  .  metoprolol tartrate (LOPRESSOR) 25 MG tablet, Take by mouth., Disp: , Rfl:  .  omeprazole (PRILOSEC) 20 MG capsule, Take by mouth., Disp: , Rfl:  .  senna-docusate (SENOKOT-S) 8.6-50 MG per tablet, Take by mouth., Disp: , Rfl:   Past Medical History: No past medical history on file.  Tobacco Use: History  Smoking status  . Not on file  Smokeless tobacco  . Not on file    Labs: Recent Review Flowsheet Data    There is no flowsheet data to display.       Exercise Target Goals:    Exercise Program Goal: Individual exercise prescription set with THRR, safety & activity barriers. Participant demonstrates ability to understand and report RPE using BORG scale, to self-measure pulse accurately, and to acknowledge the importance of the exercise prescription.  Exercise Prescription Goal: Starting with aerobic activity 30 plus minutes a day, 3 days per  week for initial exercise prescription. Provide home exercise prescription and guidelines that participant acknowledges understanding prior to discharge.  Activity Barriers & Risk Stratification:   6 Minute Walk:     6 Minute Walk      03/20/15 0833 03/20/15 0934     6 Minute Walk   Phase Initial Discharge    Distance 1100 feet 820 feet    Distance % Change  -34 %    Walk Time 5 minutes 3.5 minutes    Resting HR 59 bpm 66 bpm    Resting BP 140/68 mmHg 120/64 mmHg    Max Ex. HR 90 bpm 79 bpm    Max Ex. BP 162/78 mmHg 140/58 mmHg    RPE 11 11    Symptoms Yes (comment) Yes (comment)    Comments Back pain. no cardiac symptoms  back pain       Initial Exercise Prescription:   Exercise Prescription Changes:     Exercise Prescription Changes      01/17/15 0700 03/13/15 1100 03/14/15 1300 03/20/15 0900     Exercise Review   Progression No  Absent since last review  Yes Yes    Response to Exercise   Blood Pressure (Admit)   150/80 mmHg 150/80 mmHg    Blood Pressure (Exercise)   172/80 mmHg 172/80 mmHg    Blood Pressure (Exit)   140/84 mmHg 140/84 mmHg    Heart Rate (Admit)   81  bpm 81 bpm    Heart Rate (Exercise)   117 bpm 117 bpm    Heart Rate (Exit)   65 bpm 65 bpm    Rating of Perceived Exertion (Exercise)   13 13    Duration   Progress to 30 minutes of continuous aerobic without signs/symptoms of physical distress Progress to 30 minutes of continuous aerobic without signs/symptoms of physical distress    Intensity   Rest + 30 Rest + 30    Progression   Continue progressive overload as per policy without signs/symptoms or physical distress. Continue progressive overload as per policy without signs/symptoms or physical distress.    Resistance Training   Training Prescription    Yes    Weight    4    Reps    10-15    Interval Training   Interval Training  Yes Yes Yes    Equipment  REL-XR REL-XR REL-XR    Comments  L6-7 80-110w L6-7 80-110w L6-7 80-110w    REL-XR    Level   7 7    Watts   110 110    Minutes   30 30    Home Exercise Plan   Plans to continue exercise at    [employer] (comment)  [name]       Discharge Exercise Prescription (Final Exercise Prescription Changes):     Exercise Prescription Changes - 03/20/15 0900    Exercise Review   Progression Yes   Response to Exercise   Blood Pressure (Admit) 150/80 mmHg   Blood Pressure (Exercise) 172/80 mmHg   Blood Pressure (Exit) 140/84 mmHg   Heart Rate (Admit) 81 bpm   Heart Rate (Exercise) 117 bpm   Heart Rate (Exit) 65 bpm   Rating of Perceived Exertion (Exercise) 13   Duration Progress to 30 minutes of continuous aerobic without signs/symptoms of physical distress   Intensity Rest + 30   Progression Continue progressive overload as per policy without signs/symptoms or physical distress.   Resistance Training   Training Prescription Yes   Weight 4   Reps 10-15   Interval Training   Interval Training Yes   Equipment REL-XR   Comments L6-7 80-110w   REL-XR   Level 7   Watts 110   Minutes 30   Home Exercise Plan   Plans to continue exercise at [employer] (comment)  [employer]      Nutrition:  Target Goals: Understanding of nutrition guidelines, daily intake of sodium 1500mg , cholesterol 200mg , calories 30% from fat and 7% or less from saturated fats, daily to have 5 or more servings of fruits and vegetables.  Biometrics:     Pre Biometrics - 03/20/15 0835    Pre Biometrics   Height 5' 10.5" (1.791 m)   Weight 214 lb 12.8 oz (97.433 kg)   Waist Circumference 43 inches   Hip Circumference 40 inches   Waist to Hip Ratio 1.08 %   BMI (Calculated) 30.4       Nutrition Therapy Plan and Nutrition Goals:     Nutrition Therapy & Goals - 03/03/15 0943    Personal Nutrition Goals   Personal Goal #1 Has started eating more salads and is more conscious of fat and sodium in the diet      Nutrition Discharge: Rate Your  Plate Scores:     Rate Your Plate - 16/10/96 1028    Rate Your Plate Scores   Post Score 75   Post  Score % 83 %      Nutrition Goals Re-Evaluation:   Psychosocial: Target Goals: Acknowledge presence or absence of depression, maximize coping skills, provide positive support system. Participant is able to verbalize types and ability to use techniques and skills needed for reducing stress and depression.  Initial Review & Psychosocial Screening:   Quality of Life Scores:     Quality of Life - 03/17/15 1033    Quality of Life Scores   Health/Function Post 28.93 %   Socioeconomic Post 27.83 %   Psych/Spiritual Post 30 %   Family Post 28.75 %   GLOBAL Post 28.95 %      PHQ-9:     Recent Review Flowsheet Data    Depression screen [facility] 2/9 03/17/2015   Decreased Interest 0   Down, Depressed, Hopeless 0   PHQ - 2 Score 0   Altered sleeping 0   Tired, decreased energy 0   Change in appetite 0   Feeling bad or failure about yourself  0   Trouble concentrating 0   Moving slowly or fidgety/restless 1   Suicidal thoughts 0   PHQ-9 Score 1   Difficult doing work/chores Not difficult at all      Psychosocial Evaluation and Intervention:   Psychosocial Re-Evaluation:   Vocational Rehabilitation: Provide vocational rehab assistance to qualifying candidates.   Vocational Rehab Evaluation & Intervention:   Education: Education Goals: Education classes will be provided on a weekly basis, covering required topics. Participant will state understanding/return demonstration of topics presented.  Learning Barriers/Preferences:   Education Topics: General Nutrition Guidelines/Fats and Fiber: -Group instruction provided by verbal, written material, models and posters to present the general guidelines for heart healthy nutrition. Gives an explanation and review of dietary fats and fiber.   Controlling Sodium/Reading Food Labels: -Group verbal and written material  supporting the discussion of sodium use in heart healthy nutrition. Review and explanation with models, verbal and written materials for utilization of the food label.   Exercise Physiology & Risk Factors: - Group verbal and written instruction with models to review the exercise physiology of the cardiovascular system and associated critical values. Details cardiovascular disease risk factors and the goals associated with each risk factor.      Cardiac Rehab from 03/20/2015 in [facility] Cardiac Rehab   Date  03/08/15   Educator  RM   Instruction Review Code  2- meets goals/outcomes      Aerobic Exercise & Resistance Training: - Gives group verbal and written discussion on the health impact of inactivity. On the components of aerobic and resistive training programs and the benefits of this training and how to safely progress through these programs.      Cardiac Rehab from 03/20/2015 in [facility] Cardiac Rehab   Date  03/13/15   Educator  rm   Instruction Review Code  2- meets goals/outcomes      Flexibility, Balance, General Exercise Guidelines: - Provides group verbal and written instruction on the benefits of flexibility and balance training programs. Provides general exercise guidelines with specific guidelines to those with heart or lung disease. Demonstration and skill practice provided.      Cardiac Rehab from 03/20/2015 in [facility] Cardiac Rehab   Date  03/20/15   Educator  [facility]   Instruction Review Code  2- meets goals/outcomes      Stress Management: - Provides group verbal and written instruction about the health risks of elevated stress, cause of high stress, and healthy ways to reduce stress.  Depression: - Provides group verbal and written instruction on the correlation between heart/lung disease and depressed mood, treatment options, and the stigmas associated with seeking treatment.      Cardiac Rehab from 03/20/2015 in [facility] Cardiac Rehab   Date  02/22/15   Educator  [facility]    Instruction Review Code  2- meets goals/outcomes      Anatomy & Physiology of the Heart: - Group verbal and written instruction and models provide basic cardiac anatomy and physiology, with the coronary electrical and arterial systems. Review of: AMI, Angina, Valve disease, Heart Failure, Cardiac Arrhythmia, Pacemakers, and the ICD.   Cardiac Procedures: - Group verbal and written instruction and models to describe the testing methods done to diagnose heart disease. Reviews the outcomes of the test results. Describes the treatment choices: Medical Management, Angioplasty, or Coronary Bypass Surgery.      Cardiac Rehab from 03/20/2015 in [facility] Cardiac Rehab   Date  03/01/15   Educator  SB   Instruction Review Code  2- meets goals/outcomes      Cardiac Medications: - Group verbal and written instruction to review commonly prescribed medications for heart disease. Reviews the medication, class of the drug, and side effects. Includes the steps to properly store meds and maintain the prescription regimen.      Cardiac Rehab from 03/20/2015 in [facility] Cardiac Rehab   Date  03/06/15   Educator  SB   Instruction Review Code  2- meets goals/outcomes      Go Sex-Intimacy & Heart Disease, Get SMART - Goal Setting: - Group verbal and written instruction through game format to discuss heart disease and the return to sexual intimacy. Provides group verbal and written material to discuss and apply goal setting through the application of the S.M.A.R.T. Method.      Cardiac Rehab from 03/20/2015 in [facility] Cardiac Rehab   Date  03/01/15   Educator  SB   Instruction Review Code  2- meets goals/outcomes      Other Matters of the Heart: - Provides group verbal, written materials and models to describe Heart Failure, Angina, Valve Disease, and Diabetes in the realm of heart disease. Includes description of the disease process and treatment options available to the cardiac patient.   Exercise & Equipment  Safety: - Individual verbal instruction and demonstration of equipment use and safety with use of the equipment.   Infection Prevention: - Provides verbal and written material to individual with discussion of infection control including proper hand washing and proper equipment cleaning during exercise session.   Falls Prevention: - Provides verbal and written material to individual with discussion of falls prevention and safety.   Diabetes: - Individual verbal and written instruction to review signs/symptoms of diabetes, desired ranges of glucose level fasting, after meals and with exercise. Advice that pre and post exercise glucose checks will be done for 3 sessions at entry of program.    Knowledge Questionnaire Score:     Knowledge Questionnaire Score - 03/17/15 1026    Knowledge Questionnaire Score   Post Score 22      Personal Goals and Risk Factors at Admission:   Personal Goals and Risk Factors Review:      Goals and Risk Factor Review      03/03/15 0943           Increase Aerobic Exercise and Physical Activity   Goals Progress/Improvement seen  Yes       Comments Has increased levels on the reucmbent elliptical, cannot do  certain machines because of his back. Feels stronger.        Understand more about Heart/Pulmonary Disease   Goals Progress/Improvement seen  Yes       Comments Education has been very helpful, is really enjoying the program          Personal Goals Discharge:     Comments: ready for discharge

## 2015-03-24 DIAGNOSIS — Z951 Presence of aortocoronary bypass graft: Secondary | ICD-10-CM

## 2015-03-24 NOTE — Progress Notes (Signed)
Daily Session Note  Patient Details  Name: Bradley [name] MRN: [id number] Date of Birth: 10/10/1940 Referring Provider:  [facility]  Encounter Date: 03/24/2015  Check In:     Session Check In - 03/24/15 0854    Check-In   Staff Present Heath Lark RN, BSN, CCRP;Carroll Enterkin RN, BSN;Other   ER physicians immediately available to respond to emergencies See telemetry face sheet for immediately available ER MD   Medication changes reported     No   Fall or balance concerns reported    No   Warm-up and Cool-down Performed on first and last piece of equipment   VAD Patient? No   Pain Assessment   Currently in Pain? No/denies         Goals Met:  Independence with exercise equipment Exercise tolerated well Personal goals reviewed No report of cardiac concerns or symptoms Strength training completed today  Goals Unmet:  Not Applicable  Goals Comments: Doing well with exercise prescription progression.    Dr. Emily Filbert is Medical Director for [place] and [facility].

## 2015-03-27 ENCOUNTER — Encounter: Payer: Self-pay | Admitting: *Deleted

## 2015-03-27 ENCOUNTER — Encounter: Payer: Non-veteran care | Attending: General Practice

## 2015-03-27 VITALS — Ht 70.5 in | Wt 214.8 lb

## 2015-03-27 DIAGNOSIS — Z951 Presence of aortocoronary bypass graft: Secondary | ICD-10-CM | POA: Insufficient documentation

## 2015-03-27 NOTE — Progress Notes (Signed)
Discharge Summary  Patient Details  Name: Bradley [name] MRN: [id number] Date of Birth: April 27, 1941 Referring Provider:  [facility]   Number of Visits: 36  Reason for Discharge:  Patient reached a stable level of exercise. Patient independent in their exercise.  Smoking History:  History  Smoking status  . Not on file  Smokeless tobacco  . Not on file    Diagnosis:  S/P CABG x 3  ADL UCSD:   Initial Exercise Prescription:   Discharge Exercise Prescription (Final Exercise Prescription Changes):     Exercise Prescription Changes - 03/20/15 0900    Exercise Review   Progression Yes   Response to Exercise   Blood Pressure (Admit) 150/80 mmHg   Blood Pressure (Exercise) 172/80 mmHg   Blood Pressure (Exit) 140/84 mmHg   Heart Rate (Admit) 81 bpm   Heart Rate (Exercise) 117 bpm   Heart Rate (Exit) 65 bpm   Rating of Perceived Exertion (Exercise) 13   Duration Progress to 30 minutes of continuous aerobic without signs/symptoms of physical distress   Intensity Rest + 30   Progression Continue progressive overload as per policy without signs/symptoms or physical distress.   Resistance Training   Training Prescription Yes   Weight 4   Reps 10-15   Interval Training   Interval Training Yes   Equipment REL-XR   Comments L6-7 80-110w   REL-XR   Level 7   Watts 110   Minutes 30   Home Exercise Plan   Plans to continue exercise at [facility] (comment)  [name]      Functional Capacity:     6 Minute Walk      03/20/15 [phone] 03/20/15 0934     6 Minute Walk   Phase Initial Discharge    Distance 1100 feet 820 feet    Distance % Change  -34 %    Walk Time 5 minutes 3.5 minutes    Resting HR 59 bpm 66 bpm    Resting BP 140/68 mmHg 120/64 mmHg    Max Ex. HR 90 bpm 79 bpm    Max Ex. BP 162/78 mmHg 140/58 mmHg    RPE 11 11    Symptoms Yes (comment) Yes (comment)    Comments Back pain. no cardiac symptoms  back pain        Psychological, QOL, Others - Outcomes: PHQ 2/9: Depression screen PHQ 2/9 03/17/2015  Decreased Interest 0  Down, Depressed, Hopeless 0  PHQ - 2 Score 0  Altered sleeping 0  Tired, decreased energy 0  Change in appetite 0  Feeling bad or failure about yourself  0  Trouble concentrating 0  Moving slowly or fidgety/restless 1  Suicidal thoughts 0  PHQ-9 Score 1  Difficult doing work/chores Not difficult at all    Quality of Life:     Quality of Life - 03/17/15 1033    Quality of Life Scores   Health/Function Post 28.93 %   Socioeconomic Post 27.83 %   Psych/Spiritual Post 30 %   Family Post 28.75 %   GLOBAL Post 28.95 %      Personal Goals: Goals established at orientation with interventions provided to work toward goal.    Personal Goals Discharge:   Nutrition & Weight - Outcomes:     Pre Biometrics - 03/20/15 0835    Pre Biometrics   Height 5' 10.5" (1.791 m)   Weight 214 lb 12.8 oz (97.433 kg)   Waist Circumference 43 inches  Hip Circumference 40 inches   Waist to Hip Ratio 1.08 %   BMI (Calculated) 30.4         Post Biometrics - 03/27/15 1233     Post  Biometrics   Height 5' 10.5" (1.791 m)   Weight 214 lb 12.8 oz (97.433 kg)   Waist Circumference 43 inches   Hip Circumference 40 inches   Waist to Hip Ratio 1.08 %   BMI (Calculated) 30.4      Nutrition:     Nutrition Therapy & Goals - 03/03/15 0943    Personal Nutrition Goals   Personal Goal #1 Has started eating more salads and is more conscious of fat and sodium in the diet      Nutrition Discharge:     Rate Your Plate - 16/10/96 1028    Rate Your Plate Scores   Post Score 75   Post Score % 83 %      Education Questionnaire Score:     Knowledge Questionnaire Score - 03/17/15 1026    Knowledge Questionnaire Score   Post Score 22      Goals reviewed with patient; copy given to patient.  Discharged 03/27/2015

## 2015-03-27 NOTE — Progress Notes (Signed)
Daily Session Note  Patient Details  Name: Bradley [name] MRN: [id number] Date of Birth: 1941-01-15 Referring Provider:  [facility] [place] [facility]  Encounter Date: 03/27/2015  Check In:     Session Check In - 03/27/15 0829    Check-In   Staff Present Heath Lark RN, BSN, CCRP;Renee Dillard [place] MS, ACSM CEP Exercise Physiologist;Other   ER physicians immediately available to respond to emergencies See telemetry face sheet for immediately available ER MD   Medication changes reported     No   Fall or balance concerns reported    No   Warm-up and Cool-down Performed on first and last piece of equipment   VAD Patient? No   Pain Assessment   Currently in Pain? No/denies         Goals Met:  Independence with exercise equipment Exercise tolerated well Personal goals reviewed No report of cardiac concerns or symptoms  Goals Unmet:  Not Applicable  Goals Comments: Discharged today to home exercise,   Dr. Emily Filbert is Medical Director for [place] and [facility].

## 2015-03-27 NOTE — Progress Notes (Signed)
Daily Session Note  Patient Details  Name: Bradley [name] MRN: [id number] Date of Birth: 06-Nov-1940 Referring Provider:  [facility] [place] [facility]  Encounter Date: 03/27/2015  Check In:     Session Check In - 03/27/15 0829    Check-In   Staff Present Heath Lark RN, BSN, CCRP;Renee Dillard Essex MS, ACSM CEP Exercise Physiologist;Other   ER physicians immediately available to respond to emergencies See telemetry face sheet for immediately available ER MD   Medication changes reported     No   Fall or balance concerns reported    No   Warm-up and Cool-down Performed on first and last piece of equipment   VAD Patient? No   Pain Assessment   Currently in Pain? No/denies         Goals Met:  Independence with exercise equipment Exercise tolerated well No report of cardiac concerns or symptoms Strength training completed today  Goals Unmet:  Not Applicable  Goals Comments:    Dr. Emily Filbert is Medical Director for [place] and [facility].

## 2015-03-27 NOTE — Patient Instructions (Signed)
Discharge Instructions  Patient Details  Name: Bradley [name] MRN: [id number] Date of Birth: 04-16-1941 Referring Provider:  No ref. provider found   Number of Visits: 36  Reason for Discharge:  Patient reached a stable level of exercise. Patient independent in their exercise.  Smoking History:  History  Smoking status  . Not on file  Smokeless tobacco  . Not on file    Diagnosis:  No diagnosis found.  Initial Exercise Prescription:   Discharge Exercise Prescription (Final Exercise Prescription Changes):     Exercise Prescription Changes - 03/20/15 0900    Exercise Review   Progression Yes   Response to Exercise   Blood Pressure (Admit) 150/80 mmHg   Blood Pressure (Exercise) 172/80 mmHg   Blood Pressure (Exit) 140/84 mmHg   Heart Rate (Admit) 81 bpm   Heart Rate (Exercise) 117 bpm   Heart Rate (Exit) 65 bpm   Rating of Perceived Exertion (Exercise) 13   Duration Progress to 30 minutes of continuous aerobic without signs/symptoms of physical distress   Intensity Rest + 30   Progression Continue progressive overload as per policy without signs/symptoms or physical distress.   Resistance Training   Training Prescription Yes   Weight 4   Reps 10-15   Interval Training   Interval Training Yes   Equipment REL-XR   Comments L6-7 80-110w   REL-XR   Level 7   Watts 110   Minutes 30   Home Exercise Plan   Plans to continue exercise at [facility] (comment)  [name]      Functional Capacity:     6 Minute Walk      03/20/15 [phone] 03/20/15 0934     6 Minute Walk   Phase Initial Discharge    Distance 1100 feet 820 feet    Distance % Change  -34 %    Walk Time 5 minutes 3.5 minutes    Resting HR 59 bpm 66 bpm    Resting BP 140/68 mmHg 120/64 mmHg    Max Ex. HR 90 bpm 79 bpm    Max Ex. BP 162/78 mmHg 140/58 mmHg    RPE 11 11    Symptoms Yes (comment) Yes (comment)    Comments Back pain. no cardiac symptoms  back pain        Quality of Life:     Quality of Life - 03/17/15 1033    Quality of Life Scores   Health/Function Post 28.93 %   Socioeconomic Post 27.83 %   Psych/Spiritual Post 30 %   Family Post 28.75 %   GLOBAL Post 28.95 %      Personal Goals: Goals established at orientation with interventions provided to work toward goal.    Personal Goals Discharge:   Nutrition & Weight - Outcomes:     Pre Biometrics - 03/20/15 0835    Pre Biometrics   Height 5' 10.5" (1.791 m)   Weight 214 lb 12.8 oz (97.433 kg)   Waist Circumference 43 inches   Hip Circumference 40 inches   Waist to Hip Ratio 1.08 %   BMI (Calculated) 30.4       Nutrition:     Nutrition Therapy & Goals - 03/03/15 0943    Personal Nutrition Goals   Personal Goal #1 Has started eating more salads and is more conscious of fat and sodium in the diet      Nutrition Discharge:     Rate Your Plate - 29/56/21 1028    Rate  Your Plate Scores   Post Score 75   Post Score % 83 %      Education Questionnaire Score:     Knowledge Questionnaire Score - 03/17/15 1026    Knowledge Questionnaire Score   Post Score 22      Goals reviewed with patient; copy given to patient.  [name]  Keep up the great exercise routine.

## 2015-03-27 NOTE — Progress Notes (Signed)
Cardiac Individual Treatment Plan  Patient Details  Name: Bradley [name] MRN: [id number] Date of Birth: [birth year] Referring Provider:  [facility]  Initial Encounter Date:    Visit Diagnosis: S/P CABG x 3  Patient's Home Medications on Admission:  Current outpatient prescriptions:  .  amLODipine (NORVASC) 10 MG tablet, Take by mouth., Disp: , Rfl:  .  aspirin 81 MG chewable tablet, Chew by mouth., Disp: , Rfl:  .  clopidogrel (PLAVIX) 75 MG tablet, Take by mouth., Disp: , Rfl:  .  furosemide (LASIX) 20 MG tablet, Take by mouth., Disp: , Rfl:  .  glipiZIDE (GLUCOTROL) 5 MG tablet, Take by mouth., Disp: , Rfl:  .  hydrochlorothiazide (HYDRODIURIL) 25 MG tablet, Take by mouth., Disp: , Rfl:  .  indomethacin (INDOCIN) 50 MG capsule, Take by mouth., Disp: , Rfl:  .  isosorbide mononitrate (IMDUR) 60 MG 24 hr tablet, Take by mouth., Disp: , Rfl:  .  lisinopril (PRINIVIL,ZESTRIL) 40 MG tablet, Take by mouth., Disp: , Rfl:  .  metFORMIN (GLUCOPHAGE) 1000 MG tablet, Take by mouth., Disp: , Rfl:  .  metoprolol tartrate (LOPRESSOR) 25 MG tablet, Take by mouth., Disp: , Rfl:  .  omeprazole (PRILOSEC) 20 MG capsule, Take by mouth., Disp: , Rfl:  .  senna-docusate (SENOKOT-S) 8.6-50 MG per tablet, Take by mouth., Disp: , Rfl:   Past Medical History: No past medical history on file.  Tobacco Use: History  Smoking status  . Not on file  Smokeless tobacco  . Not on file    Labs: Recent Review Flowsheet Data    There is no flowsheet data to display.       Exercise Target Goals:    Exercise Program Goal: Individual exercise prescription set with THRR, safety & activity barriers. Participant demonstrates ability to understand and report RPE using BORG scale, to self-measure pulse accurately, and to acknowledge the importance of the exercise prescription.  Exercise Prescription Goal: Starting with aerobic activity 30 plus minutes a day, 3 days per week for initial exercise  prescription. Provide home exercise prescription and guidelines that participant acknowledges understanding prior to discharge.  Activity Barriers & Risk Stratification:   6 Minute Walk:     6 Minute Walk      03/20/15 0833 03/20/15 0934     6 Minute Walk   Phase Initial Discharge    Distance 1100 feet 820 feet    Distance % Change  -34 %    Walk Time 5 minutes 3.5 minutes    Resting HR 59 bpm 66 bpm    Resting BP 140/68 mmHg 120/64 mmHg    Max Ex. HR 90 bpm 79 bpm    Max Ex. BP 162/78 mmHg 140/58 mmHg    RPE 11 11    Symptoms Yes (comment) Yes (comment)    Comments Back pain. no cardiac symptoms  back pain       Initial Exercise Prescription:   Exercise Prescription Changes:     Exercise Prescription Changes      01/17/15 0700 03/13/15 1100 03/14/15 1300 03/20/15 0900     Exercise Review   Progression No  Absent since last review  Yes Yes    Response to Exercise   Blood Pressure (Admit)   150/80 mmHg 150/80 mmHg    Blood Pressure (Exercise)   172/80 mmHg 172/80 mmHg    Blood Pressure (Exit)   140/84 mmHg 140/84 mmHg    Heart Rate (Admit)   81 bpm 81  bpm    Heart Rate (Exercise)   117 bpm 117 bpm    Heart Rate (Exit)   65 bpm 65 bpm    Rating of Perceived Exertion (Exercise)   13 13    Duration   Progress to 30 minutes of continuous aerobic without signs/symptoms of physical distress Progress to 30 minutes of continuous aerobic without signs/symptoms of physical distress    Intensity   Rest + 30 Rest + 30    Progression   Continue progressive overload as per policy without signs/symptoms or physical distress. Continue progressive overload as per policy without signs/symptoms or physical distress.    Resistance Training   Training Prescription    Yes    Weight    4    Reps    10-15    Interval Training   Interval Training  Yes Yes Yes    Equipment  REL-XR REL-XR REL-XR    Comments  L6-7 80-110w L6-7 80-110w L6-7 80-110w    REL-XR   Level   7 7    Watts   110  110    Minutes   30 30    Home Exercise Plan   Plans to continue exercise at    [employer] (comment)  [name]       Discharge Exercise Prescription (Final Exercise Prescription Changes):     Exercise Prescription Changes - 03/20/15 0900    Exercise Review   Progression Yes   Response to Exercise   Blood Pressure (Admit) 150/80 mmHg   Blood Pressure (Exercise) 172/80 mmHg   Blood Pressure (Exit) 140/84 mmHg   Heart Rate (Admit) 81 bpm   Heart Rate (Exercise) 117 bpm   Heart Rate (Exit) 65 bpm   Rating of Perceived Exertion (Exercise) 13   Duration Progress to 30 minutes of continuous aerobic without signs/symptoms of physical distress   Intensity Rest + 30   Progression Continue progressive overload as per policy without signs/symptoms or physical distress.   Resistance Training   Training Prescription Yes   Weight 4   Reps 10-15   Interval Training   Interval Training Yes   Equipment REL-XR   Comments L6-7 80-110w   REL-XR   Level 7   Watts 110   Minutes 30   Home Exercise Plan   Plans to continue exercise at [employer] (comment)  [employer]      Nutrition:  Target Goals: Understanding of nutrition guidelines, daily intake of sodium 1500mg , cholesterol 200mg , calories 30% from fat and 7% or less from saturated fats, daily to have 5 or more servings of fruits and vegetables.  Biometrics:     Pre Biometrics - 03/20/15 0835    Pre Biometrics   Height 5' 10.5" (1.791 m)   Weight 214 lb 12.8 oz (97.433 kg)   Waist Circumference 43 inches   Hip Circumference 40 inches   Waist to Hip Ratio 1.08 %   BMI (Calculated) 30.4         Post Biometrics - 03/27/15 1233     Post  Biometrics   Height 5' 10.5" (1.791 m)   Weight 214 lb 12.8 oz (97.433 kg)   Waist Circumference 43 inches   Hip Circumference 40 inches   Waist to Hip Ratio 1.08 %   BMI (Calculated) 30.4      Nutrition Therapy Plan and Nutrition  Goals:     Nutrition Therapy & Goals - 03/03/15 0943    Personal Nutrition  Goals   Personal Goal #1 Has started eating more salads and is more conscious of fat and sodium in the diet      Nutrition Discharge: Rate Your Plate Scores:     Rate Your Plate - 96/04/54 1028    Rate Your Plate Scores   Post Score 75   Post Score % 83 %      Nutrition Goals Re-Evaluation:   Psychosocial: Target Goals: Acknowledge presence or absence of depression, maximize coping skills, provide positive support system. Participant is able to verbalize types and ability to use techniques and skills needed for reducing stress and depression.  Initial Review & Psychosocial Screening:   Quality of Life Scores:     Quality of Life - 03/17/15 1033    Quality of Life Scores   Health/Function Post 28.93 %   Socioeconomic Post 27.83 %   Psych/Spiritual Post 30 %   Family Post 28.75 %   GLOBAL Post 28.95 %      PHQ-9:     Recent Review Flowsheet Data    Depression screen [facility] 2/9 03/17/2015   Decreased Interest 0   Down, Depressed, Hopeless 0   PHQ - 2 Score 0   Altered sleeping 0   Tired, decreased energy 0   Change in appetite 0   Feeling bad or failure about yourself  0   Trouble concentrating 0   Moving slowly or fidgety/restless 1   Suicidal thoughts 0   PHQ-9 Score 1   Difficult doing work/chores Not difficult at all      Psychosocial Evaluation and Intervention:   Psychosocial Re-Evaluation:   Vocational Rehabilitation: Provide vocational rehab assistance to qualifying candidates.   Vocational Rehab Evaluation & Intervention:   Education: Education Goals: Education classes will be provided on a weekly basis, covering required topics. Participant will state understanding/return demonstration of topics presented.  Learning Barriers/Preferences:   Education Topics: General Nutrition Guidelines/Fats and Fiber: -Group instruction provided by verbal, written material,  models and posters to present the general guidelines for heart healthy nutrition. Gives an explanation and review of dietary fats and fiber.   Controlling Sodium/Reading Food Labels: -Group verbal and written material supporting the discussion of sodium use in heart healthy nutrition. Review and explanation with models, verbal and written materials for utilization of the food label.   Exercise Physiology & Risk Factors: - Group verbal and written instruction with models to review the exercise physiology of the cardiovascular system and associated critical values. Details cardiovascular disease risk factors and the goals associated with each risk factor.      Cardiac Rehab from 03/27/2015 in [facility] Cardiac Rehab   Date  03/08/15   Educator  RM   Instruction Review Code  2- meets goals/outcomes      Aerobic Exercise & Resistance Training: - Gives group verbal and written discussion on the health impact of inactivity. On the components of aerobic and resistive training programs and the benefits of this training and how to safely progress through these programs.      Cardiac Rehab from 03/27/2015 in [facility] Cardiac Rehab   Date  03/13/15   Educator  rm   Instruction Review Code  2- meets goals/outcomes      Flexibility, Balance, General Exercise Guidelines: - Provides group verbal and written instruction on the benefits of flexibility and balance training programs. Provides general exercise guidelines with specific guidelines to those with heart or lung disease. Demonstration and skill practice provided.      Cardiac  Rehab from 03/27/2015 in [facility] Cardiac Rehab   Date  03/20/15   Educator  [facility]   Instruction Review Code  2- meets goals/outcomes      Stress Management: - Provides group verbal and written instruction about the health risks of elevated stress, cause of high stress, and healthy ways to reduce stress.      Cardiac Rehab from 03/27/2015 in [facility] Cardiac Rehab   Date  03/22/15   Educator   [facility]   Instruction Review Code  2- meets goals/outcomes      Depression: - Provides group verbal and written instruction on the correlation between heart/lung disease and depressed mood, treatment options, and the stigmas associated with seeking treatment.      Cardiac Rehab from 03/27/2015 in [facility] Cardiac Rehab   Date  02/22/15   Educator  [facility]   Instruction Review Code  2- meets goals/outcomes      Anatomy & Physiology of the Heart: - Group verbal and written instruction and models provide basic cardiac anatomy and physiology, with the coronary electrical and arterial systems. Review of: AMI, Angina, Valve disease, Heart Failure, Cardiac Arrhythmia, Pacemakers, and the ICD.      Cardiac Rehab from 03/27/2015 in [facility] Cardiac Rehab   Date  03/27/15   Educator  SB   Instruction Review Code  2- meets goals/outcomes      Cardiac Procedures: - Group verbal and written instruction and models to describe the testing methods done to diagnose heart disease. Reviews the outcomes of the test results. Describes the treatment choices: Medical Management, Angioplasty, or Coronary Bypass Surgery.      Cardiac Rehab from 03/27/2015 in [facility] Cardiac Rehab   Date  03/01/15   Educator  SB   Instruction Review Code  2- meets goals/outcomes      Cardiac Medications: - Group verbal and written instruction to review commonly prescribed medications for heart disease. Reviews the medication, class of the drug, and side effects. Includes the steps to properly store meds and maintain the prescription regimen.      Cardiac Rehab from 03/27/2015 in [facility] Cardiac Rehab   Date  03/06/15   Educator  SB   Instruction Review Code  2- meets goals/outcomes      Go Sex-Intimacy & Heart Disease, Get SMART - Goal Setting: - Group verbal and written instruction through game format to discuss heart disease and the return to sexual intimacy. Provides group verbal and written material to discuss and apply goal setting through the  application of the S.M.A.R.T. Method.      Cardiac Rehab from 03/27/2015 in [facility] Cardiac Rehab   Date  03/01/15   Educator  SB   Instruction Review Code  2- meets goals/outcomes      Other Matters of the Heart: - Provides group verbal, written materials and models to describe Heart Failure, Angina, Valve Disease, and Diabetes in the realm of heart disease. Includes description of the disease process and treatment options available to the cardiac patient.   Exercise & Equipment Safety: - Individual verbal instruction and demonstration of equipment use and safety with use of the equipment.   Infection Prevention: - Provides verbal and written material to individual with discussion of infection control including proper hand washing and proper equipment cleaning during exercise session.   Falls Prevention: - Provides verbal and written material to individual with discussion of falls prevention and safety.   Diabetes: - Individual verbal and written instruction to review signs/symptoms of diabetes, desired ranges of  glucose level fasting, after meals and with exercise. Advice that pre and post exercise glucose checks will be done for 3 sessions at entry of program.    Knowledge Questionnaire Score:     Knowledge Questionnaire Score - 03/17/15 1026    Knowledge Questionnaire Score   Post Score 22      Personal Goals and Risk Factors at Admission:   Personal Goals and Risk Factors Review:      Goals and Risk Factor Review      03/03/15 0943 03/24/15 1317         Increase Aerobic Exercise and Physical Activity   Goals Progress/Improvement seen  Yes Yes      Comments Has increased levels on the reucmbent elliptical, cannot do certain machines because of his back. Feels stronger.  Has increased levels on the reucmbent elliptical, cannot do certain machines because of his back. Feels stronger.       Understand more about Heart/Pulmonary Disease   Goals Progress/Improvement  seen  Yes Yes      Comments Education has been very helpful, is really enjoying the program Education has been very helpful, is really enjoying the program      Hypertension   Progress seen toward goals  Yes      Comments  BP diong well      Abnormal Lipids   Progress seen towards goals  Unknown      Comments  No labs to review         Personal Goals Discharge:     Comments: 30 day review  Discharged today to home exercise.

## 2015-03-27 NOTE — Progress Notes (Signed)
Discharge Summary  Patient Details  Name: Bradley [name] MRN: [id number] Date of Birth: [birth year] Referring Provider:  No ref. provider found   Number of Visits: 36  Reason for Discharge:  Patient reached a stable level of exercise. Patient independent in their exercise.  Smoking History:  History  Smoking status  . Not on file  Smokeless tobacco  . Not on file    Diagnosis:  No diagnosis found.  ADL UCSD:   Initial Exercise Prescription:   Discharge Exercise Prescription (Final Exercise Prescription Changes):     Exercise Prescription Changes - 03/20/15 0900    Exercise Review   Progression Yes   Response to Exercise   Blood Pressure (Admit) 150/80 mmHg   Blood Pressure (Exercise) 172/80 mmHg   Blood Pressure (Exit) 140/84 mmHg   Heart Rate (Admit) 81 bpm   Heart Rate (Exercise) 117 bpm   Heart Rate (Exit) 65 bpm   Rating of Perceived Exertion (Exercise) 13   Duration Progress to 30 minutes of continuous aerobic without signs/symptoms of physical distress   Intensity Rest + 30   Progression Continue progressive overload as per policy without signs/symptoms or physical distress.   Resistance Training   Training Prescription Yes   Weight 4   Reps 10-15   Interval Training   Interval Training Yes   Equipment REL-XR   Comments L6-7 80-110w   REL-XR   Level 7   Watts 110   Minutes 30   Home Exercise Plan   Plans to continue exercise at [facility] (comment)  [name]      Functional Capacity:     6 Minute Walk      03/20/15 [phone] 03/20/15 0934     6 Minute Walk   Phase Initial Discharge    Distance 1100 feet 820 feet    Distance % Change  -34 %    Walk Time 5 minutes 3.5 minutes    Resting HR 59 bpm 66 bpm    Resting BP 140/68 mmHg 120/64 mmHg    Max Ex. HR 90 bpm 79 bpm    Max Ex. BP 162/78 mmHg 140/58 mmHg    RPE 11 11    Symptoms Yes (comment) Yes (comment)    Comments Back pain. no cardiac symptoms  back pain        Psychological, QOL, Others - Outcomes: PHQ 2/9: Depression screen PHQ 2/9 03/17/2015  Decreased Interest 0  Down, Depressed, Hopeless 0  PHQ - 2 Score 0  Altered sleeping 0  Tired, decreased energy 0  Change in appetite 0  Feeling bad or failure about yourself  0  Trouble concentrating 0  Moving slowly or fidgety/restless 1  Suicidal thoughts 0  PHQ-9 Score 1  Difficult doing work/chores Not difficult at all    Quality of Life:     Quality of Life - 03/17/15 1033    Quality of Life Scores   Health/Function Post 28.93 %   Socioeconomic Post 27.83 %   Psych/Spiritual Post 30 %   Family Post 28.75 %   GLOBAL Post 28.95 %      Personal Goals: Goals established at orientation with interventions provided to work toward goal.    Personal Goals Discharge:   Nutrition & Weight - Outcomes:     Pre Biometrics - 03/20/15 0835    Pre Biometrics   Height 5' 10.5" (1.791 m)   Weight 214 lb 12.8 oz (97.433 kg)   Waist Circumference 43 inches  Hip Circumference 40 inches   Waist to Hip Ratio 1.08 %   BMI (Calculated) 30.4       Nutrition:     Nutrition Therapy & Goals - 03/03/15 0943    Personal Nutrition Goals   Personal Goal #1 Has started eating more salads and is more conscious of fat and sodium in the diet      Nutrition Discharge:     Rate Your Plate - 28/41/32 1028    Rate Your Plate Scores   Post Score 75   Post Score % 83 %      Education Questionnaire Score:     Knowledge Questionnaire Score - 03/17/15 1026    Knowledge Questionnaire Score   Post Score 22      Discharged 03/27/2015

## 2015-04-12 ENCOUNTER — Other Ambulatory Visit: Payer: Self-pay | Admitting: *Deleted

## 2015-04-12 DIAGNOSIS — Z951 Presence of aortocoronary bypass graft: Secondary | ICD-10-CM

## 2015-04-12 NOTE — Progress Notes (Signed)
Cardiac Individual Treatment Plan  Patient Details  Name: Bradley [name] MRN: [id number] Date of Birth: 28-Dec-1940 Referring Provider:  No ref. provider found  Initial Encounter Date:    Visit Diagnosis: No diagnosis found.  Patient's Home Medications on Admission:  Current outpatient prescriptions:  .  amLODipine (NORVASC) 10 MG tablet, Take by mouth., Disp: , Rfl:  .  aspirin 81 MG chewable tablet, Chew by mouth., Disp: , Rfl:  .  clopidogrel (PLAVIX) 75 MG tablet, Take by mouth., Disp: , Rfl:  .  furosemide (LASIX) 20 MG tablet, Take by mouth., Disp: , Rfl:  .  glipiZIDE (GLUCOTROL) 5 MG tablet, Take by mouth., Disp: , Rfl:  .  hydrochlorothiazide (HYDRODIURIL) 25 MG tablet, Take by mouth., Disp: , Rfl:  .  indomethacin (INDOCIN) 50 MG capsule, Take by mouth., Disp: , Rfl:  .  isosorbide mononitrate (IMDUR) 60 MG 24 hr tablet, Take by mouth., Disp: , Rfl:  .  lisinopril (PRINIVIL,ZESTRIL) 40 MG tablet, Take by mouth., Disp: , Rfl:  .  metFORMIN (GLUCOPHAGE) 1000 MG tablet, Take by mouth., Disp: , Rfl:  .  metoprolol tartrate (LOPRESSOR) 25 MG tablet, Take by mouth., Disp: , Rfl:  .  omeprazole (PRILOSEC) 20 MG capsule, Take by mouth., Disp: , Rfl:  .  senna-docusate (SENOKOT-S) 8.6-50 MG per tablet, Take by mouth., Disp: , Rfl:   Past Medical History: No past medical history on file.  Tobacco Use: History  Smoking status  . Not on file  Smokeless tobacco  . Not on file    Labs: Recent Review Flowsheet Data    There is no flowsheet data to display.       Exercise Target Goals:    Exercise Program Goal: Individual exercise prescription set with THRR, safety & activity barriers. Participant demonstrates ability to understand and report RPE using BORG scale, to self-measure pulse accurately, and to acknowledge the importance of the exercise prescription.  Exercise Prescription Goal: Starting with aerobic activity 30 plus minutes a day, 3 days per week for initial  exercise prescription. Provide home exercise prescription and guidelines that participant acknowledges understanding prior to discharge.  Activity Barriers & Risk Stratification:   6 Minute Walk:     6 Minute Walk      03/20/15 0833 03/20/15 0934     6 Minute Walk   Phase Initial Discharge    Distance 1100 feet 820 feet    Distance % Change  -34 %    Walk Time 5 minutes 3.5 minutes    Resting HR 59 bpm 66 bpm    Resting BP 140/68 mmHg 120/64 mmHg    Max Ex. HR 90 bpm 79 bpm    Max Ex. BP 162/78 mmHg 140/58 mmHg    RPE 11 11    Symptoms Yes (comment) Yes (comment)    Comments Back pain. no cardiac symptoms  back pain       Initial Exercise Prescription:   Exercise Prescription Changes:     Exercise Prescription Changes      01/17/15 0700 03/13/15 1100 03/14/15 1300 03/20/15 0900     Exercise Review   Progression No  Absent since last review  Yes Yes    Response to Exercise   Blood Pressure (Admit)   150/80 mmHg 150/80 mmHg    Blood Pressure (Exercise)   172/80 mmHg 172/80 mmHg    Blood Pressure (Exit)   140/84 mmHg 140/84 mmHg    Heart Rate (Admit)   81 bpm 81 bpm  Heart Rate (Exercise)   117 bpm 117 bpm    Heart Rate (Exit)   65 bpm 65 bpm    Rating of Perceived Exertion (Exercise)   13 13    Duration   Progress to 30 minutes of continuous aerobic without signs/symptoms of physical distress Progress to 30 minutes of continuous aerobic without signs/symptoms of physical distress    Intensity   Rest + 30 Rest + 30    Progression   Continue progressive overload as per policy without signs/symptoms or physical distress. Continue progressive overload as per policy without signs/symptoms or physical distress.    Resistance Training   Training Prescription    Yes    Weight    4    Reps    10-15    Interval Training   Interval Training  Yes Yes Yes    Equipment  REL-XR REL-XR REL-XR    Comments  L6-7 80-110w L6-7 80-110w L6-7 80-110w    REL-XR   Level   7 7     Watts   110 110    Minutes   30 30    Home Exercise Plan   Plans to continue exercise at    [employer] (comment)  [name]       Discharge Exercise Prescription (Final Exercise Prescription Changes):     Exercise Prescription Changes - 03/20/15 0900    Exercise Review   Progression Yes   Response to Exercise   Blood Pressure (Admit) 150/80 mmHg   Blood Pressure (Exercise) 172/80 mmHg   Blood Pressure (Exit) 140/84 mmHg   Heart Rate (Admit) 81 bpm   Heart Rate (Exercise) 117 bpm   Heart Rate (Exit) 65 bpm   Rating of Perceived Exertion (Exercise) 13   Duration Progress to 30 minutes of continuous aerobic without signs/symptoms of physical distress   Intensity Rest + 30   Progression Continue progressive overload as per policy without signs/symptoms or physical distress.   Resistance Training   Training Prescription Yes   Weight 4   Reps 10-15   Interval Training   Interval Training Yes   Equipment REL-XR   Comments L6-7 80-110w   REL-XR   Level 7   Watts 110   Minutes 30   Home Exercise Plan   Plans to continue exercise at [employer] (comment)  [employer]      Nutrition:  Target Goals: Understanding of nutrition guidelines, daily intake of sodium 1500mg , cholesterol 200mg , calories 30% from fat and 7% or less from saturated fats, daily to have 5 or more servings of fruits and vegetables.  Biometrics:     Pre Biometrics - 03/20/15 0835    Pre Biometrics   Height 5' 10.5" (1.791 m)   Weight 214 lb 12.8 oz (97.433 kg)   Waist Circumference 43 inches   Hip Circumference 40 inches   Waist to Hip Ratio 1.08 %   BMI (Calculated) 30.4         Post Biometrics - 03/27/15 1233     Post  Biometrics   Height 5' 10.5" (1.791 m)   Weight 214 lb 12.8 oz (97.433 kg)   Waist Circumference 43 inches   Hip Circumference 40 inches   Waist to Hip Ratio 1.08 %   BMI (Calculated) 30.4      Nutrition Therapy Plan and  Nutrition Goals:     Nutrition Therapy & Goals - 03/03/15 0943    Personal Nutrition Goals   Personal  Goal #1 Has started eating more salads and is more conscious of fat and sodium in the diet      Nutrition Discharge: Rate Your Plate Scores:     Rate Your Plate - 09/81/19 1028    Rate Your Plate Scores   Post Score 75   Post Score % 83 %      Nutrition Goals Re-Evaluation:   Psychosocial: Target Goals: Acknowledge presence or absence of depression, maximize coping skills, provide positive support system. Participant is able to verbalize types and ability to use techniques and skills needed for reducing stress and depression.  Initial Review & Psychosocial Screening:   Quality of Life Scores:     Quality of Life - 03/17/15 1033    Quality of Life Scores   Health/Function Post 28.93 %   Socioeconomic Post 27.83 %   Psych/Spiritual Post 30 %   Family Post 28.75 %   GLOBAL Post 28.95 %      PHQ-9:     Recent Review Flowsheet Data    Depression screen [facility] 2/9 03/17/2015   Decreased Interest 0   Down, Depressed, Hopeless 0   PHQ - 2 Score 0   Altered sleeping 0   Tired, decreased energy 0   Change in appetite 0   Feeling bad or failure about yourself  0   Trouble concentrating 0   Moving slowly or fidgety/restless 1   Suicidal thoughts 0   PHQ-9 Score 1   Difficult doing work/chores Not difficult at all      Psychosocial Evaluation and Intervention:   Psychosocial Re-Evaluation:   Vocational Rehabilitation: Provide vocational rehab assistance to qualifying candidates.   Vocational Rehab Evaluation & Intervention:   Education: Education Goals: Education classes will be provided on a weekly basis, covering required topics. Participant will state understanding/return demonstration of topics presented.  Learning Barriers/Preferences:   Education Topics: General Nutrition Guidelines/Fats and Fiber: -Group instruction provided by verbal, written  material, models and posters to present the general guidelines for heart healthy nutrition. Gives an explanation and review of dietary fats and fiber.   Controlling Sodium/Reading Food Labels: -Group verbal and written material supporting the discussion of sodium use in heart healthy nutrition. Review and explanation with models, verbal and written materials for utilization of the food label.   Exercise Physiology & Risk Factors: - Group verbal and written instruction with models to review the exercise physiology of the cardiovascular system and associated critical values. Details cardiovascular disease risk factors and the goals associated with each risk factor.      Cardiac Rehab from 03/27/2015 in [facility] Cardiac Rehab   Date  03/08/15   Educator  RM   Instruction Review Code  2- meets goals/outcomes      Aerobic Exercise & Resistance Training: - Gives group verbal and written discussion on the health impact of inactivity. On the components of aerobic and resistive training programs and the benefits of this training and how to safely progress through these programs.      Cardiac Rehab from 03/27/2015 in [facility] Cardiac Rehab   Date  03/13/15   Educator  rm   Instruction Review Code  2- meets goals/outcomes      Flexibility, Balance, General Exercise Guidelines: - Provides group verbal and written instruction on the benefits of flexibility and balance training programs. Provides general exercise guidelines with specific guidelines to those with heart or lung disease. Demonstration and skill practice provided.      Cardiac Rehab from 03/27/2015 in  [facility] Cardiac Rehab   Date  03/20/15   Educator  [facility]   Instruction Review Code  2- meets goals/outcomes      Stress Management: - Provides group verbal and written instruction about the health risks of elevated stress, cause of high stress, and healthy ways to reduce stress.      Cardiac Rehab from 03/27/2015 in [facility] Cardiac Rehab   Date  03/22/15    Educator  [facility]   Instruction Review Code  2- meets goals/outcomes      Depression: - Provides group verbal and written instruction on the correlation between heart/lung disease and depressed mood, treatment options, and the stigmas associated with seeking treatment.      Cardiac Rehab from 03/27/2015 in [facility] Cardiac Rehab   Date  02/22/15   Educator  [facility]   Instruction Review Code  2- meets goals/outcomes      Anatomy & Physiology of the Heart: - Group verbal and written instruction and models provide basic cardiac anatomy and physiology, with the coronary electrical and arterial systems. Review of: AMI, Angina, Valve disease, Heart Failure, Cardiac Arrhythmia, Pacemakers, and the ICD.      Cardiac Rehab from 03/27/2015 in [facility] Cardiac Rehab   Date  03/27/15   Educator  SB   Instruction Review Code  2- meets goals/outcomes      Cardiac Procedures: - Group verbal and written instruction and models to describe the testing methods done to diagnose heart disease. Reviews the outcomes of the test results. Describes the treatment choices: Medical Management, Angioplasty, or Coronary Bypass Surgery.      Cardiac Rehab from 03/27/2015 in [facility] Cardiac Rehab   Date  03/01/15   Educator  SB   Instruction Review Code  2- meets goals/outcomes      Cardiac Medications: - Group verbal and written instruction to review commonly prescribed medications for heart disease. Reviews the medication, class of the drug, and side effects. Includes the steps to properly store meds and maintain the prescription regimen.      Cardiac Rehab from 03/27/2015 in [facility] Cardiac Rehab   Date  03/06/15   Educator  SB   Instruction Review Code  2- meets goals/outcomes      Go Sex-Intimacy & Heart Disease, Get SMART - Goal Setting: - Group verbal and written instruction through game format to discuss heart disease and the return to sexual intimacy. Provides group verbal and written material to discuss and apply goal setting  through the application of the S.M.A.R.T. Method.      Cardiac Rehab from 03/27/2015 in [facility] Cardiac Rehab   Date  03/01/15   Educator  SB   Instruction Review Code  2- meets goals/outcomes      Other Matters of the Heart: - Provides group verbal, written materials and models to describe Heart Failure, Angina, Valve Disease, and Diabetes in the realm of heart disease. Includes description of the disease process and treatment options available to the cardiac patient.   Exercise & Equipment Safety: - Individual verbal instruction and demonstration of equipment use and safety with use of the equipment.   Infection Prevention: - Provides verbal and written material to individual with discussion of infection control including proper hand washing and proper equipment cleaning during exercise session.   Falls Prevention: - Provides verbal and written material to individual with discussion of falls prevention and safety.   Diabetes: - Individual verbal and written instruction to review signs/symptoms of diabetes, desired ranges of glucose level fasting, after  meals and with exercise. Advice that pre and post exercise glucose checks will be done for 3 sessions at entry of program.    Knowledge Questionnaire Score:     Knowledge Questionnaire Score - 03/17/15 1026    Knowledge Questionnaire Score   Post Score 22      Personal Goals and Risk Factors at Admission:   Personal Goals and Risk Factors Review:      Goals and Risk Factor Review      03/03/15 0943 03/24/15 1317         Increase Aerobic Exercise and Physical Activity   Goals Progress/Improvement seen  Yes Yes      Comments Has increased levels on the reucmbent elliptical, cannot do certain machines because of his back. Feels stronger.  Has increased levels on the reucmbent elliptical, cannot do certain machines because of his back. Feels stronger.       Understand more about Heart/Pulmonary Disease   Goals  Progress/Improvement seen  Yes Yes      Comments Education has been very helpful, is really enjoying the program Education has been very helpful, is really enjoying the program      Hypertension   Progress seen toward goals  Yes      Comments  BP diong well      Abnormal Lipids   Progress seen towards goals  Unknown      Comments  No labs to review         Personal Goals Discharge:     Comments: Was recently discharged. See discharge summary.

## 2018-03-13 ENCOUNTER — Encounter: Payer: Self-pay | Admitting: Emergency Medicine

## 2018-03-13 ENCOUNTER — Emergency Department: Payer: Non-veteran care

## 2018-03-13 ENCOUNTER — Other Ambulatory Visit: Payer: Self-pay

## 2018-03-13 ENCOUNTER — Observation Stay
Admission: EM | Admit: 2018-03-13 | Discharge: 2018-03-14 | Disposition: A | Discharge status: Home / Self Care | Payer: Non-veteran care | Attending: Internal Medicine | Admitting: Internal Medicine

## 2018-03-13 DIAGNOSIS — I252 Old myocardial infarction: Secondary | ICD-10-CM | POA: Insufficient documentation

## 2018-03-13 DIAGNOSIS — Z888 Allergy status to other drugs, medicaments and biological substances status: Secondary | ICD-10-CM | POA: Insufficient documentation

## 2018-03-13 DIAGNOSIS — E785 Hyperlipidemia, unspecified: Secondary | ICD-10-CM | POA: Diagnosis not present

## 2018-03-13 DIAGNOSIS — N183 Chronic kidney disease, stage 3 (moderate): Secondary | ICD-10-CM | POA: Diagnosis not present

## 2018-03-13 DIAGNOSIS — E860 Dehydration: Principal | ICD-10-CM | POA: Insufficient documentation

## 2018-03-13 DIAGNOSIS — I6523 Occlusion and stenosis of bilateral carotid arteries: Secondary | ICD-10-CM | POA: Insufficient documentation

## 2018-03-13 DIAGNOSIS — Z66 Do not resuscitate: Secondary | ICD-10-CM | POA: Insufficient documentation

## 2018-03-13 DIAGNOSIS — I700 Atherosclerosis of aorta: Secondary | ICD-10-CM | POA: Diagnosis not present

## 2018-03-13 DIAGNOSIS — I080 Rheumatic disorders of both mitral and aortic valves: Secondary | ICD-10-CM | POA: Insufficient documentation

## 2018-03-13 DIAGNOSIS — N179 Acute kidney failure, unspecified: Secondary | ICD-10-CM | POA: Insufficient documentation

## 2018-03-13 DIAGNOSIS — Z951 Presence of aortocoronary bypass graft: Secondary | ICD-10-CM | POA: Insufficient documentation

## 2018-03-13 DIAGNOSIS — Z87891 Personal history of nicotine dependence: Secondary | ICD-10-CM | POA: Insufficient documentation

## 2018-03-13 DIAGNOSIS — R55 Syncope and collapse: Secondary | ICD-10-CM | POA: Diagnosis present

## 2018-03-13 DIAGNOSIS — I2510 Atherosclerotic heart disease of native coronary artery without angina pectoris: Secondary | ICD-10-CM | POA: Insufficient documentation

## 2018-03-13 DIAGNOSIS — R9431 Abnormal electrocardiogram [ECG] [EKG]: Secondary | ICD-10-CM | POA: Diagnosis present

## 2018-03-13 DIAGNOSIS — Z7982 Long term (current) use of aspirin: Secondary | ICD-10-CM | POA: Diagnosis not present

## 2018-03-13 DIAGNOSIS — Z7984 Long term (current) use of oral hypoglycemic drugs: Secondary | ICD-10-CM | POA: Insufficient documentation

## 2018-03-13 DIAGNOSIS — Z79899 Other long term (current) drug therapy: Secondary | ICD-10-CM | POA: Insufficient documentation

## 2018-03-13 DIAGNOSIS — Z9049 Acquired absence of other specified parts of digestive tract: Secondary | ICD-10-CM | POA: Diagnosis not present

## 2018-03-13 DIAGNOSIS — E1122 Type 2 diabetes mellitus with diabetic chronic kidney disease: Secondary | ICD-10-CM | POA: Diagnosis not present

## 2018-03-13 DIAGNOSIS — I959 Hypotension, unspecified: Secondary | ICD-10-CM | POA: Diagnosis not present

## 2018-03-13 DIAGNOSIS — N189 Chronic kidney disease, unspecified: Secondary | ICD-10-CM

## 2018-03-13 DIAGNOSIS — I129 Hypertensive chronic kidney disease with stage 1 through stage 4 chronic kidney disease, or unspecified chronic kidney disease: Secondary | ICD-10-CM | POA: Diagnosis not present

## 2018-03-13 HISTORY — DX: Essential (primary) hypertension: I10

## 2018-03-13 HISTORY — DX: Type 2 diabetes mellitus without complications: E11.9

## 2018-03-13 HISTORY — DX: Hyperlipidemia, unspecified: E78.5

## 2018-03-13 LAB — BASIC METABOLIC PANEL
ANION GAP: 7 (ref 5–15)
BUN: 30 mg/dL — ABNORMAL HIGH (ref 8–23)
CALCIUM: 8.5 mg/dL — AB (ref 8.9–10.3)
CO2: 25 mmol/L (ref 22–32)
CREATININE: 1.77 mg/dL — AB (ref 0.61–1.24)
Chloride: 106 mmol/L (ref 98–111)
GFR, EST AFRICAN AMERICAN: 41 mL/min — AB (ref >60)
GFR, EST NON AFRICAN AMERICAN: 36 mL/min — AB (ref >60)
Glucose, Bld: 261 mg/dL — ABNORMAL HIGH (ref 70–99)
Potassium: 4.3 mmol/L (ref 3.5–5.1)
SODIUM: 138 mmol/L (ref 135–145)

## 2018-03-13 LAB — CBC
HCT: 34.5 % — ABNORMAL LOW (ref 40.0–52.0)
Hemoglobin: 11.8 g/dL — ABNORMAL LOW (ref 13.0–18.0)
MCH: 33.1 pg (ref 26.0–34.0)
MCHC: 34.2 g/dL (ref 32.0–36.0)
MCV: 96.8 fL (ref 80.0–100.0)
PLATELETS: 197 10*3/uL (ref 150–440)
RBC: 3.57 MIL/uL — ABNORMAL LOW (ref 4.40–5.90)
RDW: 13.4 % (ref 11.5–14.5)
WBC: 9.9 10*3/uL (ref 3.8–10.6)

## 2018-03-13 LAB — MAGNESIUM: MAGNESIUM: 2 mg/dL (ref 1.7–2.4)

## 2018-03-13 LAB — TROPONIN I

## 2018-03-13 NOTE — ED Triage Notes (Signed)
[place] [facility] [place] [facility] [place] and states started feeling dizzy and weak and sat down, no LOC.  EMS states pale skin, pt presents minor pale color and clammy skin.  EMS initial BP 66/33 and HR of 40, sinus brady on their monitor.  Patient given 625 normal saline and 0.5mg  atropine en route, new vitals 132/62 and 76 HR, CBG 133.

## 2018-03-13 NOTE — ED Provider Notes (Signed)
[facility] Emergency Department Provider Note  ____________________________________________  Time seen: Approximately 9:00 PM  I have reviewed the triage vital signs and the nursing notes.   HISTORY  Chief Complaint Near Syncope   HPI Bradley [name] is a 77 y.o. male with a history of CAD status post CABG, chronic kidney disease, diabetes who presents for evaluation of near syncopal episode.  Patient reports that he spent several hours outside in very hot and humid weather heat cooking over 100 hotdogs for a church event this evening.  He grabbed a hot dog and went inside.  He sat down the couch and started feeling dizzy, he reports that he could hear people talking to him but he was unable to respond.  He never fully lost consciousness.  When EMS arrived patient was bradycardic with EKG showing sinus bradycardia, and hypotensive.  Sugar was normal.  They gave him a milligram of IV atropine and 500 cc bolus.  Patient denies chest pain, palpitations, headache, back pain, shortness of breath, abdominal pain both preceding or after this event.  He reports that he feels back to normal.  He thinks that he might be a little dehydrated.  He denies alcohol use.  He reports that he drank some ginger ale this evening.  Patient's only complaint is pain on his right wrist which she has had since earlier today, sharp, constant and worse with movement.  He denies falling or hitting his wrist.  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 Authorizing Provider  amLODipine (NORVASC) 10 MG tablet Take  by mouth.    [provider]  furosemide (LASIX) 20 MG tablet Take by mouth. 10/09/14 10/09/15  [provider]  glipiZIDE (GLUCOTROL) 5 MG tablet Take by mouth. 10/09/14 10/09/15  [provider]  hydrochlorothiazide (HYDRODIURIL) 25 MG tablet Take by mouth.    [provider]  indomethacin (INDOCIN) 50 MG capsule Take by mouth.    [provider]  isosorbide mononitrate (IMDUR) 60 MG 24 hr tablet Take by mouth.    [provider]  lisinopril (PRINIVIL,ZESTRIL) 40 MG tablet Take by mouth.    [provider]  metFORMIN (GLUCOPHAGE) 1000 MG tablet Take by mouth. 10/09/14 10/09/15  [provider]  metoprolol tartrate (LOPRESSOR) 25 MG tablet Take by mouth. 10/09/14 10/09/15  [provider]  omeprazole (PRILOSEC) 20 MG capsule Take by mouth.    [provider]    Allergies Simvastatin  History reviewed. No pertinent family history.  Social History Social History   Tobacco Use  . Smoking status: Former Games developermoker  . Smokeless tobacco: Never Used  Substance Use Topics  . Alcohol use: Never    Frequency: Never  . Drug use: Never    Review of Systems  Constitutional: Negative for fever. + near syncope Eyes: Negative for visual changes. ENT: Negative for sore throat. Neck: No neck pain  Cardiovascular: Negative for chest pain. Respiratory: Negative for shortness of breath. Gastrointestinal: Negative for abdominal pain, vomiting or diarrhea. Genitourinary: Negative for dysuria. Musculoskeletal: Negative for back pain. + R wrist pain Skin: Negative for rash. Neurological: Negative for  headaches, weakness or numbness. Psych: No SI or HI  ____________________________________________   PHYSICAL EXAM:  VITAL SIGNS: ED Triage Vitals  Enc Vitals Group     BP 03/13/18 2052 125/62     Pulse Rate 03/13/18 2052 71     Resp 03/13/18 2052 15     Temp 03/13/18 2052 97.7 F (36.5 C)     Temp Source  03/13/18 2052 Oral     SpO2 03/13/18 2052 99 %     Weight 03/13/18 2055 215 lb (97.5 kg)     Height 03/13/18 2055 6' (1.829 m)     Head Circumference --      Peak Flow --      Pain Score 03/13/18 2054 0     Pain Loc --      Pain Edu? --      Excl. in GC? --     Constitutional: Alert and oriented. Well appearing and in no apparent distress. HEENT:      Head: Normocephalic and atraumatic.         Eyes: Conjunctivae are normal. Sclera is non-icteric.       Mouth/Throat: Mucous membranes are moist.       Neck: Supple with no signs of meningismus. Cardiovascular: Regular rate and rhythm. No murmurs, gallops, or rubs. 2+ symmetrical distal pulses are present in all extremities. No JVD. Respiratory: Normal respiratory effort. Lungs are clear to auscultation bilaterally. No wheezes, crackles, or rhonchi.  Gastrointestinal: Soft, non tender, and non distended with positive bowel sounds. No rebound or guarding. Musculoskeletal: Nontender with normal range of motion in all extremities. No edema, cyanosis, or erythema of extremities.  Tender to palpation on the dorsal aspect of the right wrist with no obvious deformities, erythema, swelling Neurologic: Normal speech and language. A & O x3, PERRL, EOMI, no nystagmus, CN II-XII intact, motor testing reveals good tone and bulk throughout. There is no evidence of pronator drift or dysmetria. Muscle strength is 5/5 throughout.  Sensory examination is intact. Gait deferred. Skin: Skin is warm, dry and intact. No rash noted. Psychiatric: Mood and affect are normal. Speech and behavior are normal.  ____________________________________________   LABS (all labs ordered are listed, but only abnormal results are displayed)  Labs Reviewed  CBC - Abnormal; Notable for the following components:      Result Value   RBC 3.57 (*)    Hemoglobin 11.8 (*)    HCT 34.5 (*)    All other components within normal limits  BASIC METABOLIC PANEL - Abnormal; Notable for  the following components:   Glucose, Bld 261 (*)    BUN 30 (*)    Creatinine, Ser 1.77 (*)    Calcium 8.5 (*)    GFR calc non Af Amer 36 (*)    GFR calc Af Amer 41 (*)    All other components within normal limits  TROPONIN I  MAGNESIUM   ____________________________________________  EKG  ED ECG REPORT I, Nita Sickle, the attending physician, personally viewed and interpreted this ECG.  Normal sinus rhythm, normal intervals, normal axis, diffuse TWI in inferior and lateral leads with no STE, no evidence of HOCM, AV block, delta wave, ARVD, prolonged QTc, WPW, or Brugada.   ____________________________________________  RADIOLOGY  I have personally reviewed the images performed during this visit and I agree with the Radiologist's read.   Interpretation by Radiologist:  Dg Chest 2 View  Result Date: 03/13/2018 CLINICAL DATA:  Dizziness, hypotension with bradycardia-possible vagal reaction EXAM: CHEST -  2 VIEW COMPARISON:  06/08/2016 FINDINGS: Atherosclerotic calcification of the aortic arch. Prior CABG noted. Mild enlargement of the cardiopericardial silhouette, without edema. Thoracic spondylosis. IMPRESSION: 1. Mild stable enlargement of the cardiopericardial silhouette, without edema. 2. Aortic Atherosclerosis (ICD10-I70.0). Electronically Signed   By: Gaylyn Rong M.D.   On: 03/13/2018 21:48   Dg Wrist Complete Right  Result Date: 03/13/2018 CLINICAL DATA:  Started feeling dizzy and weak and sat down, no LOC. EMS states pale skin, pt presents minor pale color and clammy skin. Acute wrist pain. Hypotensive and bradycardic, EMS administered 0.5 mg atropine. EXAM: RIGHT WRIST - COMPLETE 3+ VIEW COMPARISON:  None. FINDINGS: No significant bony abnormality observed. Minimal spurring at the first carpometacarpal articulation. Vascular calcifications are present. IMPRESSION: 1. No acute bony findings. 2. Atherosclerosis. Electronically Signed   By: Gaylyn Rong M.D.   On:  03/13/2018 21:46     ____________________________________________   PROCEDURES  Procedure(s) performed: None Procedures Critical Care performed:  None ____________________________________________   INITIAL IMPRESSION / ASSESSMENT AND PLAN / ED COURSE   77 y.o. male with a history of CAD status post CABG, chronic kidney disease, diabetes who presents for evaluation of near syncopal episode preceded by dizziness that happened this evening after patient was several hours outside in the heat cooking more than 100 hotdogs for a church event.  Patient feels back to normal at this time.  He was bradycardic and hypotensive when EMS arrived concerning for possible vasovagal vs dehydration versus dysrhythmia.  EKG showing diffuse T wave inversions in inferior lateral leads with no prior for comparison.  Will check labs to rule out acute kidney injury, electrolyte abnormalities, cardiac ischemia.  Will monitor on telemetry for dysrhythmias.  Will give IV fluids for possible dehydration.    _________________________ 10:01 PM on 03/13/2018 -----------------------------------------  Patient meets criteria for admission for high risk syncope in the setting of PMH, labs consistent with mild dehydration with acute on chronic kidney injury, an abnormal EKG. Will discuss with the Hospitalist   As part of my medical decision making, I reviewed the following data within the electronic MEDICAL RECORD NUMBER Nursing notes reviewed and incorporated, Labs reviewed , EKG interpreted , Old chart reviewed, Radiograph reviewed , Discussed with admitting physician , Notes from prior ED visits and  Controlled Substance Database    Pertinent labs & imaging results that were available during my care of the patient were reviewed by me and considered in my medical decision making (see chart for details).    ____________________________________________   FINAL CLINICAL IMPRESSION(S) / ED DIAGNOSES  Final diagnoses:    Near syncope  Abnormal EKG  Acute kidney injury superimposed on chronic kidney disease (HCC)  Dehydration      NEW MEDICATIONS STARTED DURING THIS VISIT:  ED Discharge Orders    None       Note:  This document was prepared using Dragon voice recognition software and may include unintentional dictation errors.    Nita Sickle, MD 03/13/18 2202

## 2018-03-13 NOTE — H&P (Signed)
[facility] [place] [facility]   PATIENT NAME: Bradley [name]    MR#:  [id number]  DATE OF BIRTH:  Jun 06, 1941  DATE OF ADMISSION:  03/13/2018  PRIMARY CARE PHYSICIAN: Floyde Parkinsyer, Christopher, MD   REQUESTING/REFERRING PHYSICIAN:   CHIEF COMPLAINT:   Chief Complaint  Patient presents with  . Near Syncope    HISTORY OF PRESENT ILLNESS: Bradley [name]  is a 77 y.o. male with a known history of CAD, status post CABG in the past, diabetes type 2 and chronic kidney disease. Patient was brought to emergency room for near syncopal episode.  He reports that he spent several hours outside in very hot and humid weather cooking hotdogs for a church event this evening.  After he finished cooking his sat down on a couch and at that point he felt lightheaded.  He did not fully lost consciousness.  When paramedics arrived, patient was noted with bradycardia, and 40s and low blood pressure, 66/33.  BP improved to 132/62 and heart rate to 76 after normal saline bolus and atropine. Patient denies any chest pain, shortness of breath, palpitation, associated with this event.  No fever/chills, nausea, vomiting, diarrhea, no bleeding. Blood test done emergency room are notable for creatinine level of 1.77.  Glucose level is 261.  First troponin level is less than 0.03. EKG shows normal sinus rhythm with a heart rate of 72, normal intervals, normal axis and diffuse T wave inversion in inferior and lateral leads; no ST elevation. Patient is admitted for further evaluation and treatment.  PAST MEDICAL HISTORY:   Past Medical History:  Diagnosis Date  . Diabetes mellitus without complication (HCC)   . Hyperlipidemia   . Hypertension     PAST SURGICAL HISTORY:  Past Surgical History:  Procedure Laterality Date  . CHOLECYSTECTOMY    . CORONARY ARTERY BYPASS GRAFT      SOCIAL HISTORY:  Social History   Tobacco Use  . Smoking status: Former Games developermoker  . Smokeless tobacco: Never Used   Substance Use Topics  . Alcohol use: Never    Frequency: Never    FAMILY HISTORY: History reviewed. No pertinent family history.  DRUG ALLERGIES:  Allergies  Allergen Reactions  . Simvastatin     REVIEW OF SYSTEMS:   CONSTITUTIONAL: No fever, but patient complains of fatigue and generalized weakness.  EYES: No blurred or double vision.  EARS, NOSE, AND THROAT: No tinnitus or ear pain.  RESPIRATORY: No cough, shortness of breath, wheezing or hemoptysis.  CARDIOVASCULAR: Positive for lightheadedness.  No chest pain, orthopnea, edema.  GASTROINTESTINAL: No nausea, vomiting, diarrhea or abdominal pain.  GENITOURINARY: No dysuria, hematuria.  ENDOCRINE: No polyuria, nocturia,  HEMATOLOGY: No anemia, easy bruising or bleeding SKIN: No rash or lesion. MUSCULOSKELETAL: No joint pain.   NEUROLOGIC: No tingling, numbness, weakness.  PSYCHIATRY: No anxiety or depression.   MEDICATIONS AT HOME:  Prior to Admission medications   Medication Sig Start Date End Date Taking? Authorizing Provider  amLODipine (NORVASC) 10 MG tablet Take 10 mg by mouth daily.    Yes [provider]  aspirin EC 81 MG tablet Take 1 tablet by mouth daily.   Yes [provider]  Carboxymethylcellulose Sodium (THERATEARS) 0.25 % SOLN Apply 1 drop to eye 4 (four) times daily.   Yes [provider]  furosemide (LASIX) 20 MG tablet Take 20 mg by mouth daily.  10/09/14 03/13/18 Yes [provider]  glipiZIDE (GLUCOTROL) 5 MG tablet Take 5 mg by mouth 2 (  two) times daily before a meal.  10/09/14 03/13/18 Yes [provider]  hydrochlorothiazide (HYDRODIURIL) 25 MG tablet Take 25 mg by mouth at bedtime.    Yes [provider]  indomethacin (INDOCIN) 50 MG capsule Take 50 mg by mouth daily.    Yes [provider]  isosorbide mononitrate (IMDUR) 60 MG 24 hr tablet Take 60 mg by mouth daily.    Yes [provider]  lisinopril (PRINIVIL,ZESTRIL) 40 MG tablet  Take 40 mg by mouth daily.    Yes [provider]  omeprazole (PRILOSEC) 20 MG capsule Take 40 mg by mouth daily.    Yes [provider]  travoprost, benzalkonium, (TRAVATAN) 0.004 % ophthalmic solution Place 1 drop into both eyes nightly.   Yes [provider]  metFORMIN (GLUCOPHAGE) 1000 MG tablet Take by mouth. 10/09/14 10/09/15  [provider]  metoprolol tartrate (LOPRESSOR) 25 MG tablet Take by mouth. 10/09/14 10/09/15  [provider]      PHYSICAL EXAMINATION:   VITAL SIGNS: Blood pressure (!) 129/54, pulse 78, temperature 97.7 F (36.5 C), temperature source Oral, resp. rate 17, height 6' (1.829 m), weight 97.5 kg (215 lb), SpO2 99 %.  GENERAL:  77 y.o.-year-old patient lying in the bed with no acute distress.  EYES: Pupils equal, round, reactive to light and accommodation. No scleral icterus. Extraocular muscles intact.  HEENT: Head atraumatic, normocephalic. Oropharynx and nasopharynx clear.  NECK:  Supple, no jugular venous distention. No thyroid enlargement, no tenderness.  LUNGS: Normal breath sounds bilaterally, no wheezing, rales,rhonchi or crepitation. No use of accessory muscles of respiration.  CARDIOVASCULAR: S1, S2 normal. No S3/S4.  ABDOMEN: Soft, nontender, nondistended. Bowel sounds present. No organomegaly or mass.  EXTREMITIES: No pedal edema, cyanosis, or clubbing.  NEUROLOGIC: Cranial nerves II through XII are intact. Muscle strength 5/5 in all extremities. Sensation intact. Gait is stable.  PSYCHIATRIC: The patient is alert and oriented x 3.  SKIN: No obvious rash, lesion, or ulcer.   LABORATORY PANEL:   CBC Recent Labs  Lab 03/13/18 2053  WBC 9.9  HGB 11.8*  HCT 34.5*  PLT 197  MCV 96.8  MCH 33.1  MCHC 34.2  RDW 13.4   ------------------------------------------------------------------------------------------------------------------  Chemistries  Recent Labs  Lab 03/13/18 2053  NA 138  K 4.3  CL 106   CO2 25  GLUCOSE 261*  BUN 30*  CREATININE 1.77*  CALCIUM 8.5*  MG 2.0   ------------------------------------------------------------------------------------------------------------------ estimated creatinine clearance is 43 mL/min (A) (by C-G formula based on SCr of 1.77 mg/dL (H)). ------------------------------------------------------------------------------------------------------------------ No results for input(s): TSH, T4TOTAL, T3FREE, THYROIDAB in the last 72 hours.  Invalid input(s): FREET3   Coagulation profile No results for input(s): INR, PROTIME in the last 168 hours. ------------------------------------------------------------------------------------------------------------------- No results for input(s): DDIMER in the last 72 hours. -------------------------------------------------------------------------------------------------------------------  Cardiac Enzymes Recent Labs  Lab 03/13/18 2053  TROPONINI <0.03   ------------------------------------------------------------------------------------------------------------------ Invalid input(s): POCBNP  ---------------------------------------------------------------------------------------------------------------  Urinalysis No results found for: COLORURINE, APPEARANCEUR, LABSPEC, PHURINE, GLUCOSEU, HGBUR, BILIRUBINUR, KETONESUR, PROTEINUR, UROBILINOGEN, NITRITE, LEUKOCYTESUR   RADIOLOGY: Dg Chest 2 View  Result Date: 03/13/2018 CLINICAL DATA:  Dizziness, hypotension with bradycardia-possible vagal reaction EXAM: CHEST - 2 VIEW COMPARISON:  06/08/2016 FINDINGS: Atherosclerotic calcification of the aortic arch. Prior CABG noted. Mild enlargement of the cardiopericardial silhouette, without edema. Thoracic spondylosis. IMPRESSION: 1. Mild stable enlargement of the cardiopericardial silhouette, without edema. 2. Aortic Atherosclerosis (ICD10-I70.0). Electronically Signed   By: Gaylyn Rong M.D.   On: 03/13/2018  21:48  Dg Wrist Complete Right  Result Date: 03/13/2018 CLINICAL DATA:  Started feeling dizzy and weak and sat down, no LOC. EMS states pale skin, pt presents minor pale color and clammy skin. Acute wrist pain. Hypotensive and bradycardic, EMS administered 0.5 mg atropine. EXAM: RIGHT WRIST - COMPLETE 3+ VIEW COMPARISON:  None. FINDINGS: No significant bony abnormality observed. Minimal spurring at the first carpometacarpal articulation. Vascular calcifications are present. IMPRESSION: 1. No acute bony findings. 2. Atherosclerosis. Electronically Signed   By: Gaylyn Rong M.D.   On: 03/13/2018 21:46    EKG: Orders placed or performed during the hospital encounter of 03/13/18  . ED EKG  . ED EKG  . EKG 12-Lead  . EKG 12-Lead    IMPRESSION AND PLAN:  1.  Near syncope, likely related to hypotension and dehydration, resolved.  Will rule out ACS.  Continue to monitor patient on telemetry and follow troponin levels.  Will check 2D echo and carotid ultrasound.  Will have cardiology further evaluate and treat the patient. 2. ARF/CKD3, likely prerenal secondary to dehydration from working in the sun.  We will start gentle IV hydration and monitor kidney function closely.  Avoid nephrotoxic medications. 3.  Diabetes type 2.  Will monitor blood sugars before meals and at bedtime and start low-carb diet.  Will use insulin treatment during the hospital stay. 4.  Hypotension, likely related to dehydration, resolved status post IV fluids.  We will continue to monitor BP closely and adjust medications as needed.  All the records are reviewed and case discussed with ED provider. Management plans discussed with the patient, family and they are in agreement.  CODE STATUS: Full Advance Directive Documentation     Most Recent Value  Type of Advance Directive  Healthcare Power of Attorney  Pre-existing out of facility DNR order (yellow form or pink MOST form)  -  "MOST" Form in Place?  -        TOTAL TIME TAKING CARE OF THIS PATIENT: 45 minutes.    Cammy Copa M.D on 03/13/2018 at 11:35 PM  Between 7am to 6pm - Pager - [phone]  After 6pm go to [email or web address] - password [email or web address]  [facility]  Office  [phone]  CC: Primary care physician; Floyde Parkins, MD

## 2018-03-14 ENCOUNTER — Observation Stay: Payer: Non-veteran care

## 2018-03-14 ENCOUNTER — Observation Stay (HOSPITAL_BASED_OUTPATIENT_CLINIC_OR_DEPARTMENT_OTHER)
Admit: 2018-03-14 | Discharge: 2018-03-14 | Disposition: A | Discharge status: Home / Self Care | Payer: Non-veteran care | Attending: Internal Medicine | Admitting: Internal Medicine

## 2018-03-14 DIAGNOSIS — I340 Nonrheumatic mitral (valve) insufficiency: Secondary | ICD-10-CM | POA: Diagnosis not present

## 2018-03-14 DIAGNOSIS — I351 Nonrheumatic aortic (valve) insufficiency: Secondary | ICD-10-CM | POA: Diagnosis not present

## 2018-03-14 LAB — BASIC METABOLIC PANEL
Anion gap: 8 (ref 5–15)
BUN: 23 mg/dL (ref 8–23)
CO2: 24 mmol/L (ref 22–32)
CREATININE: 1.01 mg/dL (ref 0.61–1.24)
Calcium: 8.7 mg/dL — ABNORMAL LOW (ref 8.9–10.3)
Chloride: 109 mmol/L (ref 98–111)
GFR calc Af Amer: >60 mL/min (ref >60)
Glucose, Bld: 122 mg/dL — ABNORMAL HIGH (ref 70–99)
POTASSIUM: 3.5 mmol/L (ref 3.5–5.1)
SODIUM: 141 mmol/L (ref 135–145)

## 2018-03-14 LAB — CBC
HEMATOCRIT: 35 % — AB (ref 40.0–52.0)
Hemoglobin: 12.2 g/dL — ABNORMAL LOW (ref 13.0–18.0)
MCH: 33.5 pg (ref 26.0–34.0)
MCHC: 34.7 g/dL (ref 32.0–36.0)
MCV: 96.3 fL (ref 80.0–100.0)
Platelets: 193 10*3/uL (ref 150–440)
RBC: 3.63 MIL/uL — ABNORMAL LOW (ref 4.40–5.90)
RDW: 13.3 % (ref 11.5–14.5)
WBC: 9.2 10*3/uL (ref 3.8–10.6)

## 2018-03-14 LAB — GLUCOSE, CAPILLARY
GLUCOSE-CAPILLARY: 121 mg/dL — AB (ref 70–99)
Glucose-Capillary: 178 mg/dL — ABNORMAL HIGH (ref 70–99)

## 2018-03-14 LAB — TROPONIN I: Troponin I: <0.03 ng/mL (ref <0.03)

## 2018-03-14 MED ORDER — INSULIN ASPART 100 UNIT/ML ~~LOC~~ SOLN: 0.0000 [IU] | Freq: Three times a day (TID) | SUBCUTANEOUS | Status: DC

## 2018-03-14 MED ORDER — AMLODIPINE BESYLATE 10 MG PO TABS
10.0000 mg | ORAL_TABLET | Freq: Every day | ORAL | Status: DC
  Administered 2018-03-14: 10 mg via ORAL
  Filled 2018-03-14: qty 1

## 2018-03-14 MED ORDER — ONDANSETRON HCL 4 MG/2ML IJ SOLN: 4.0000 mg | Freq: Four times a day (QID) | INTRAMUSCULAR | Status: DC | PRN

## 2018-03-14 MED ORDER — SODIUM CHLORIDE 0.9 % IV SOLN
Freq: Once | INTRAVENOUS | Status: AC
  Administered 2018-03-14: 01:00:00 via INTRAVENOUS

## 2018-03-14 MED ORDER — ACETAMINOPHEN 650 MG RE SUPP: 650.0000 mg | Freq: Four times a day (QID) | RECTAL | Status: DC | PRN

## 2018-03-14 MED ORDER — POLYVINYL ALCOHOL 1.4 % OP SOLN
1.0000 [drp] | Freq: Four times a day (QID) | OPHTHALMIC | Status: DC
Start: 2018-03-14 — End: 2018-03-14
  Administered 2018-03-14: 1 [drp] via OPHTHALMIC
  Filled 2018-03-14 (×2): qty 15

## 2018-03-14 MED ORDER — POLYVINYL ALCOHOL 1.4 % OP SOLN
1.0000 [drp] | Freq: Four times a day (QID) | OPHTHALMIC | Status: DC | PRN
  Administered 2018-03-14: 1 [drp] via OPHTHALMIC
  Filled 2018-03-14: qty 15

## 2018-03-14 MED ORDER — HYDROCODONE-ACETAMINOPHEN 5-325 MG PO TABS
1.0000 | ORAL_TABLET | ORAL | Status: DC | PRN
Start: 2018-03-14 — End: 2018-03-14

## 2018-03-14 MED ORDER — TRAVOPROST (BAK FREE) 0.004 % OP SOLN
1.0000 [drp] | Freq: Every day | OPHTHALMIC | Status: DC
Start: 2018-03-14 — End: 2018-03-14
  Administered 2018-03-14: 1 [drp] via OPHTHALMIC
  Filled 2018-03-14: qty 2.5

## 2018-03-14 MED ORDER — DOCUSATE SODIUM 100 MG PO CAPS
100.0000 mg | ORAL_CAPSULE | Freq: Two times a day (BID) | ORAL | Status: DC
  Administered 2018-03-14: 100 mg via ORAL
  Filled 2018-03-14 (×2): qty 1

## 2018-03-14 MED ORDER — ONDANSETRON HCL 4 MG PO TABS
4.0000 mg | ORAL_TABLET | Freq: Four times a day (QID) | ORAL | Status: DC | PRN
Start: 2018-03-14 — End: 2018-03-14

## 2018-03-14 MED ORDER — ACETAMINOPHEN 325 MG PO TABS: 650.0000 mg | ORAL_TABLET | Freq: Four times a day (QID) | ORAL | Status: DC | PRN

## 2018-03-14 MED ORDER — PANTOPRAZOLE SODIUM 40 MG PO TBEC
40.0000 mg | DELAYED_RELEASE_TABLET | Freq: Every day | ORAL | Status: DC
  Administered 2018-03-14: 40 mg via ORAL
  Filled 2018-03-14: qty 1

## 2018-03-14 MED ORDER — TRAZODONE HCL 50 MG PO TABS: 25.0000 mg | ORAL_TABLET | Freq: Every evening | ORAL | Status: DC | PRN

## 2018-03-14 MED ORDER — HEPARIN SODIUM (PORCINE) 5000 UNIT/ML IJ SOLN
5000.0000 [IU] | Freq: Three times a day (TID) | INTRAMUSCULAR | Status: DC
  Administered 2018-03-14: 5000 [IU] via SUBCUTANEOUS
  Filled 2018-03-14: qty 1

## 2018-03-14 MED ORDER — FUROSEMIDE 20 MG PO TABS
20.0000 mg | ORAL_TABLET | Freq: Every day | ORAL | Status: DC
  Administered 2018-03-14: 20 mg via ORAL
  Filled 2018-03-14: qty 1

## 2018-03-14 MED ORDER — ISOSORBIDE MONONITRATE ER 60 MG PO TB24
60.0000 mg | ORAL_TABLET | Freq: Every day | ORAL | Status: DC
  Administered 2018-03-14: 60 mg via ORAL
  Filled 2018-03-14: qty 1

## 2018-03-14 MED ORDER — BISACODYL 5 MG PO TBEC: 5.0000 mg | DELAYED_RELEASE_TABLET | Freq: Every day | ORAL | Status: DC | PRN

## 2018-03-14 MED ORDER — HYPROMELLOSE (GONIOSCOPIC) 2.5 % OP SOLN
1.0000 [drp] | Freq: Four times a day (QID) | OPHTHALMIC | Status: DC | PRN
  Filled 2018-03-14 (×2): qty 15

## 2018-03-14 MED ORDER — LISINOPRIL 20 MG PO TABS
40.0000 mg | ORAL_TABLET | Freq: Every day | ORAL | Status: DC
  Administered 2018-03-14: 40 mg via ORAL
  Filled 2018-03-14: qty 2

## 2018-03-14 MED ORDER — INSULIN ASPART 100 UNIT/ML ~~LOC~~ SOLN: 0.0000 [IU] | Freq: Every day | SUBCUTANEOUS | Status: DC

## 2018-03-14 MED ORDER — ASPIRIN EC 81 MG PO TBEC
81.0000 mg | DELAYED_RELEASE_TABLET | Freq: Every day | ORAL | Status: DC
  Administered 2018-03-14: 81 mg via ORAL
  Filled 2018-03-14: qty 1

## 2018-03-14 MED ORDER — HYDROCHLOROTHIAZIDE 25 MG PO TABS
25.0000 mg | ORAL_TABLET | Freq: Every day | ORAL | Status: DC
  Administered 2018-03-14: 25 mg via ORAL
  Filled 2018-03-14: qty 1

## 2018-03-14 NOTE — Progress Notes (Signed)
Cardiology consult cancelled by Dr. Juliene PinaMody

## 2018-03-14 NOTE — Discharge Summary (Signed)
[employer] [facility] - [place] at [facility]   PATIENT NAME: Bradley [name]    MR#:  [id number]  DATE OF BIRTH:  15-Oct-1940  DATE OF ADMISSION:  03/13/2018 ADMITTING PHYSICIAN: Cammy Copa, MD  DATE OF DISCHARGE: March 14, 2018  PRIMARY CARE PHYSICIAN: Floyde Parkins, MD    ADMISSION DIAGNOSIS:  Dehydration [E86.0] Abnormal EKG [R94.31] Near syncope [R55] Acute kidney injury superimposed on chronic kidney disease (HCC) [N17.9, N18.9]  DISCHARGE DIAGNOSIS:  Active Problems:   Syncope   SECONDARY DIAGNOSIS:   Past Medical History:  Diagnosis Date  . Diabetes mellitus without complication (HCC)   . Hyperlipidemia   . Hypertension     HOSPITAL COURSE:  77 year old male with CAD and diabetes who presented to the emergency room due to syncope.  1.  Syncope: This was due to dehydration and hypotension.  This is not cardiac syncope.  Echocardiogram for valvular abnormalities.  Telemetry showed no abnormalities.  Troponins were negative. Carotid ultrasound showed moderate stenosis bilaterally.  He will need outpatient vascular surgery follow-up. 2.  Acute on chronic kidney disease stage III: This is from dehydration and has improved with IV fluids.  3.  Diabetes: Patient will resume outpatient medications with ADA diet.  4.  Hypotension: This is resolved with IV fluids.  This is due to dehydration.  DISCHARGE CONDITIONS AND DIET:   Stable for discharge on heart healthy cardiac diabetic diet  CONSULTS OBTAINED:  Treatment Team:  Antonieta Iba, MD Adrian Saran, MD  DRUG ALLERGIES:   Allergies  Allergen Reactions  . Simvastatin     DISCHARGE MEDICATIONS:   Allergies as of 03/14/2018      Reactions   Simvastatin       Medication List    TAKE these medications   amLODipine 10 MG tablet Commonly known as:  NORVASC Take 10 mg by mouth daily.   aspirin EC 81 MG tablet Take 1 tablet by mouth daily.   furosemide 20 MG tablet Commonly known as:   LASIX Take 20 mg by mouth daily.   glipiZIDE 5 MG tablet Commonly known as:  GLUCOTROL Take 5 mg by mouth 2 (two) times daily before a meal.   hydrochlorothiazide 25 MG tablet Commonly known as:  HYDRODIURIL Take 25 mg by mouth at bedtime.   indomethacin 50 MG capsule Commonly known as:  INDOCIN Take 50 mg by mouth daily.   isosorbide mononitrate 60 MG 24 hr tablet Commonly known as:  IMDUR Take 60 mg by mouth daily.   lisinopril 40 MG tablet Commonly known as:  PRINIVIL,ZESTRIL Take 40 mg by mouth daily.   metFORMIN 1000 MG tablet Commonly known as:  GLUCOPHAGE Take by mouth.   metoprolol tartrate 25 MG tablet Commonly known as:  LOPRESSOR Take by mouth.   omeprazole 20 MG capsule Commonly known as:  PRILOSEC Take 40 mg by mouth daily.   THERATEARS 0.25 % Soln Generic drug:  Carboxymethylcellulose Sodium Apply 1 drop to eye 4 (four) times daily.   travoprost (benzalkonium) 0.004 % ophthalmic solution Commonly known as:  TRAVATAN Place 1 drop into both eyes nightly.         Today   CHIEF COMPLAINT:   Patient doing well this morning.   VITAL SIGNS:  Blood pressure (!) 142/61, pulse 75, temperature 98.7 F (37.1 C), temperature source Oral, resp. rate 18, height 6' (1.829 m), weight 94.7 kg (208 lb 12.8 oz), SpO2 98 %.   REVIEW OF SYSTEMS:  Review of Systems  Constitutional: Negative.  Negative for chills, fever and malaise/fatigue.  HENT: Negative.  Negative for ear discharge, ear pain, hearing loss, nosebleeds and sore throat.   Eyes: Negative.  Negative for blurred vision and pain.  Respiratory: Negative.  Negative for cough, hemoptysis, shortness of breath and wheezing.   Cardiovascular: Negative.  Negative for chest pain, palpitations and leg swelling.  Gastrointestinal: Negative.  Negative for abdominal pain, blood in stool, diarrhea, nausea and vomiting.  Genitourinary: Negative.  Negative for dysuria.  Musculoskeletal: Negative.  Negative  for back pain.  Skin: Negative.   Neurological: Negative for dizziness, tremors, speech change, focal weakness, seizures and headaches.  Endo/Heme/Allergies: Negative.  Does not bruise/bleed easily.  Psychiatric/Behavioral: Negative.  Negative for depression, hallucinations and suicidal ideas.     PHYSICAL EXAMINATION:  GENERAL:  77 y.o.-year-old patient lying in the bed with no acute distress.  NECK:  Supple, no jugular venous distention. No thyroid enlargement, no tenderness.  LUNGS: Normal breath sounds bilaterally, no wheezing, rales,rhonchi  No use of accessory muscles of respiration.  CARDIOVASCULAR: S1, S2 normal. No murmurs, rubs, or gallops.  ABDOMEN: Soft, non-tender, non-distended. Bowel sounds present. No organomegaly or mass.  EXTREMITIES: No pedal edema, cyanosis, or clubbing.  PSYCHIATRIC: The patient is alert and oriented x 3.  SKIN: No obvious rash, lesion, or ulcer.   DATA REVIEW:   CBC Recent Labs  Lab 03/14/18 0551  WBC 9.2  HGB 12.2*  HCT 35.0*  PLT 193    Chemistries  Recent Labs  Lab 03/13/18 2053 03/14/18 0551  NA 138 141  K 4.3 3.5  CL 106 109  CO2 25 24  GLUCOSE 261* 122*  BUN 30* 23  CREATININE 1.77* 1.01  CALCIUM 8.5* 8.7*  MG 2.0  --     Cardiac Enzymes Recent Labs  Lab 03/13/18 2053 03/14/18 0551  TROPONINI <0.03 <0.03    Microbiology Results  @[email or web address] @  RADIOLOGY:  Dg Chest 2 View  Result Date: 03/13/2018 CLINICAL DATA:  Dizziness, hypotension with bradycardia-possible vagal reaction EXAM: CHEST - 2 VIEW COMPARISON:  06/08/2016 FINDINGS: Atherosclerotic calcification of the aortic arch. Prior CABG noted. Mild enlargement of the cardiopericardial silhouette, without edema. Thoracic spondylosis. IMPRESSION: 1. Mild stable enlargement of the cardiopericardial silhouette, without edema. 2. Aortic Atherosclerosis (ICD10-I70.0). Electronically Signed   By: Gaylyn Rong M.D.   On: 03/13/2018 21:48   Dg Wrist Complete  Right  Result Date: 03/13/2018 CLINICAL DATA:  Started feeling dizzy and weak and sat down, no LOC. EMS states pale skin, pt presents minor pale color and clammy skin. Acute wrist pain. Hypotensive and bradycardic, EMS administered 0.5 mg atropine. EXAM: RIGHT WRIST - COMPLETE 3+ VIEW COMPARISON:  None. FINDINGS: No significant bony abnormality observed. Minimal spurring at the first carpometacarpal articulation. Vascular calcifications are present. IMPRESSION: 1. No acute bony findings. 2. Atherosclerosis. Electronically Signed   By: Gaylyn Rong M.D.   On: 03/13/2018 21:46   US Carotid Bilateral  Result Date: 03/14/2018 CLINICAL DATA:  Syncope, diabetes EXAM: BILATERAL CAROTID DUPLEX ULTRASOUND TECHNIQUE: Wallace Cullens scale imaging, color Doppler and duplex ultrasound were performed of bilateral carotid and vertebral arteries in the neck. COMPARISON:  None. FINDINGS: Criteria: Quantification of carotid stenosis is based on velocity parameters that correlate the residual internal carotid diameter with NASCET-based stenosis levels, using the diameter of the distal internal carotid lumen as the denominator for stenosis measurement. The following velocity measurements were obtained: RIGHT ICA: 220/42 cm/sec CCA: 188/23 cm/sec SYSTOLIC ICA/CCA RATIO:  1.2 ECA:  231 cm/sec LEFT ICA:  162/41 cm/sec CCA: 177/21 cm/sec SYSTOLIC ICA/CCA RATIO:  0.9 ECA:  278 cm/sec RIGHT CAROTID ARTERY: Moderate scattered heterogeneous partially calcified carotid atherosclerosis. Distal right ICA demonstrates velocity elevation measuring 220/42 cm per second. Despite this, no significant turbulent flow. By ultrasound criteria, right ICA stenosis estimated at 50-69%. RIGHT VERTEBRAL ARTERY:  Antegrade LEFT CAROTID ARTERY: Heterogeneous partially calcified left carotid bifurcation atherosclerosis. Mild velocity elevation measuring 162/41 centimeters/second. This also meets criteria for a left ICA stenosis at 50-69% by ultrasound. LEFT  VERTEBRAL ARTERY:  Antegrade IMPRESSION: Moderate bilateral ICA stenoses estimated at 50-69% bilaterally by ultrasound criteria. Patent antegrade vertebral flow bilaterally Electronically Signed   By: Judie Petit.  Shick M.D.   On: 03/14/2018 09:16      Allergies as of 03/14/2018      Reactions   Simvastatin       Medication List    TAKE these medications   amLODipine 10 MG tablet Commonly known as:  NORVASC Take 10 mg by mouth daily.   aspirin EC 81 MG tablet Take 1 tablet by mouth daily.   furosemide 20 MG tablet Commonly known as:  LASIX Take 20 mg by mouth daily.   glipiZIDE 5 MG tablet Commonly known as:  GLUCOTROL Take 5 mg by mouth 2 (two) times daily before a meal.   hydrochlorothiazide 25 MG tablet Commonly known as:  HYDRODIURIL Take 25 mg by mouth at bedtime.   indomethacin 50 MG capsule Commonly known as:  INDOCIN Take 50 mg by mouth daily.   isosorbide mononitrate 60 MG 24 hr tablet Commonly known as:  IMDUR Take 60 mg by mouth daily.   lisinopril 40 MG tablet Commonly known as:  PRINIVIL,ZESTRIL Take 40 mg by mouth daily.   metFORMIN 1000 MG tablet Commonly known as:  GLUCOPHAGE Take by mouth.   metoprolol tartrate 25 MG tablet Commonly known as:  LOPRESSOR Take by mouth.   omeprazole 20 MG capsule Commonly known as:  PRILOSEC Take 40 mg by mouth daily.   THERATEARS 0.25 % Soln Generic drug:  Carboxymethylcellulose Sodium Apply 1 drop to eye 4 (four) times daily.   travoprost (benzalkonium) 0.004 % ophthalmic solution Commonly known as:  TRAVATAN Place 1 drop into both eyes nightly.          Management plans discussed with the patient and he is in agreement. Stable for discharge home  Patient should follow up with pcp  CODE STATUS:     Code Status Orders  (From admission, onward)        Start     Ordered   03/14/18 0038  Full code  Continuous     03/14/18 0038    Code Status History    This patient has a current code status but  no historical code status.    Advance Directive Documentation     Most Recent Value  Type of Advance Directive  Healthcare Power of Attorney  Pre-existing out of facility DNR order (yellow form or pink MOST form)  -  "MOST" Form in Place?  -      TOTAL TIME TAKING CARE OF THIS PATIENT: 37 minutes.    Note: This dictation was prepared with Dragon dictation along with smaller phrase technology. Any transcriptional errors that result from this process are unintentional.  Hanifah Royse M.D on 03/14/2018 at 10:17 AM  Between 7am to 6pm - Pager - [phone] After 6pm go to [email or web address] - Social research officer, government  [facility]  Office  [phone]  CC: Primary care  physician; Floyde Parkinsyer, Christopher, MD

## 2018-03-14 NOTE — Plan of Care (Signed)
  Problem: Clinical Measurements: Goal: Ability to maintain clinical measurements within normal limits will improve Outcome: Progressing   Problem: Safety: Goal: Ability to remain free from injury will improve Outcome: Progressing   

## 2018-03-14 NOTE — Progress Notes (Signed)
IV and tele removed from patient. Discharge instructions given to patient. Verbalized understanding. No distress at this time. Friend at bedside to transport patient home.

## 2018-03-15 LAB — ECHOCARDIOGRAM COMPLETE
HEIGHTINCHES: 72 in
Weight: 3340.8 oz

## 2020-10-04 HISTORY — PX: CORONARY ARTERY BYPASS GRAFT: SHX141

## 2021-07-09 ENCOUNTER — Inpatient Hospital Stay
Admission: EM | Admit: 2021-07-09 | Discharge: 2021-07-11 | DRG: 280 | Disposition: A | Discharge status: Home / Self Care | Payer: No Typology Code available for payment source | Attending: Internal Medicine | Admitting: Internal Medicine

## 2021-07-09 ENCOUNTER — Emergency Department: Payer: No Typology Code available for payment source

## 2021-07-09 DIAGNOSIS — I130 Hypertensive heart and chronic kidney disease with heart failure and stage 1 through stage 4 chronic kidney disease, or unspecified chronic kidney disease: Secondary | ICD-10-CM | POA: Diagnosis present

## 2021-07-09 DIAGNOSIS — Z888 Allergy status to other drugs, medicaments and biological substances status: Secondary | ICD-10-CM

## 2021-07-09 DIAGNOSIS — I5043 Acute on chronic combined systolic (congestive) and diastolic (congestive) heart failure: Secondary | ICD-10-CM | POA: Diagnosis present

## 2021-07-09 DIAGNOSIS — I2582 Chronic total occlusion of coronary artery: Secondary | ICD-10-CM | POA: Diagnosis present

## 2021-07-09 DIAGNOSIS — Z951 Presence of aortocoronary bypass graft: Secondary | ICD-10-CM

## 2021-07-09 DIAGNOSIS — I5033 Acute on chronic diastolic (congestive) heart failure: Secondary | ICD-10-CM

## 2021-07-09 DIAGNOSIS — I25810 Atherosclerosis of coronary artery bypass graft(s) without angina pectoris: Secondary | ICD-10-CM | POA: Diagnosis present

## 2021-07-09 DIAGNOSIS — Z87891 Personal history of nicotine dependence: Secondary | ICD-10-CM

## 2021-07-09 DIAGNOSIS — Z7984 Long term (current) use of oral hypoglycemic drugs: Secondary | ICD-10-CM

## 2021-07-09 DIAGNOSIS — Z7982 Long term (current) use of aspirin: Secondary | ICD-10-CM

## 2021-07-09 DIAGNOSIS — Z20822 Contact with and (suspected) exposure to covid-19: Secondary | ICD-10-CM | POA: Diagnosis present

## 2021-07-09 DIAGNOSIS — I2510 Atherosclerotic heart disease of native coronary artery without angina pectoris: Secondary | ICD-10-CM | POA: Diagnosis present

## 2021-07-09 DIAGNOSIS — J810 Acute pulmonary edema: Secondary | ICD-10-CM | POA: Diagnosis not present

## 2021-07-09 DIAGNOSIS — N179 Acute kidney failure, unspecified: Secondary | ICD-10-CM | POA: Diagnosis present

## 2021-07-09 DIAGNOSIS — N1831 Chronic kidney disease, stage 3a: Secondary | ICD-10-CM | POA: Diagnosis present

## 2021-07-09 DIAGNOSIS — I214 Non-ST elevation (NSTEMI) myocardial infarction: Principal | ICD-10-CM | POA: Diagnosis present

## 2021-07-09 DIAGNOSIS — I255 Ischemic cardiomyopathy: Secondary | ICD-10-CM | POA: Diagnosis present

## 2021-07-09 DIAGNOSIS — I77810 Thoracic aortic ectasia: Secondary | ICD-10-CM | POA: Diagnosis present

## 2021-07-09 DIAGNOSIS — I252 Old myocardial infarction: Secondary | ICD-10-CM

## 2021-07-09 DIAGNOSIS — Z79899 Other long term (current) drug therapy: Secondary | ICD-10-CM

## 2021-07-09 DIAGNOSIS — M545 Low back pain, unspecified: Secondary | ICD-10-CM | POA: Diagnosis present

## 2021-07-09 DIAGNOSIS — E785 Hyperlipidemia, unspecified: Secondary | ICD-10-CM | POA: Diagnosis present

## 2021-07-09 DIAGNOSIS — E1122 Type 2 diabetes mellitus with diabetic chronic kidney disease: Secondary | ICD-10-CM | POA: Diagnosis present

## 2021-07-09 DIAGNOSIS — I10 Essential (primary) hypertension: Secondary | ICD-10-CM

## 2021-07-09 DIAGNOSIS — E1165 Type 2 diabetes mellitus with hyperglycemia: Secondary | ICD-10-CM | POA: Diagnosis present

## 2021-07-09 DIAGNOSIS — G8929 Other chronic pain: Secondary | ICD-10-CM | POA: Insufficient documentation

## 2021-07-09 HISTORY — DX: Atherosclerotic heart disease of native coronary artery without angina pectoris: I25.10

## 2021-07-09 HISTORY — DX: Unspecified diastolic (congestive) heart failure: I50.30

## 2021-07-09 LAB — COMPREHENSIVE METABOLIC PANEL WITH GFR
ALT: 17 U/L (ref 0–44)
AST: 37 U/L (ref 15–41)
Albumin: 4.2 g/dL (ref 3.5–5.0)
Alkaline Phosphatase: 114 U/L (ref 38–126)
Anion gap: 9 (ref 5–15)
BUN: 31 mg/dL — ABNORMAL HIGH (ref 8–23)
CO2: 21 mmol/L — ABNORMAL LOW (ref 22–32)
Calcium: 9.1 mg/dL (ref 8.9–10.3)
Chloride: 103 mmol/L (ref 98–111)
Creatinine, Ser: 1.93 mg/dL — ABNORMAL HIGH (ref 0.61–1.24)
GFR, Estimated: 35 mL/min — ABNORMAL LOW
Glucose, Bld: 370 mg/dL — ABNORMAL HIGH (ref 70–99)
Potassium: 5.3 mmol/L — ABNORMAL HIGH (ref 3.5–5.1)
Sodium: 133 mmol/L — ABNORMAL LOW (ref 135–145)
Total Bilirubin: 1.2 mg/dL (ref 0.3–1.2)
Total Protein: 8 g/dL (ref 6.5–8.1)

## 2021-07-09 LAB — CBC WITH DIFFERENTIAL/PLATELET
Abs Immature Granulocytes: 0.15 K/uL — ABNORMAL HIGH (ref 0.00–0.07)
Basophils Absolute: 0.1 K/uL (ref 0.0–0.1)
Basophils Relative: 1 %
Eosinophils Absolute: 0.2 K/uL (ref 0.0–0.5)
Eosinophils Relative: 1 %
HCT: 45.8 % (ref 39.0–52.0)
Hemoglobin: 15 g/dL (ref 13.0–17.0)
Immature Granulocytes: 1 %
Lymphocytes Relative: 8 %
Lymphs Abs: 1.6 K/uL (ref 0.7–4.0)
MCH: 31.8 pg (ref 26.0–34.0)
MCHC: 32.8 g/dL (ref 30.0–36.0)
MCV: 97.2 fL (ref 80.0–100.0)
Monocytes Absolute: 1.7 K/uL — ABNORMAL HIGH (ref 0.1–1.0)
Monocytes Relative: 8 %
Neutro Abs: 16.7 K/uL — ABNORMAL HIGH (ref 1.7–7.7)
Neutrophils Relative %: 81 %
Platelets: 325 K/uL (ref 150–400)
RBC: 4.71 MIL/uL (ref 4.22–5.81)
RDW: 13.9 % (ref 11.5–15.5)
WBC: 20.5 K/uL — ABNORMAL HIGH (ref 4.0–10.5)
nRBC: 0 % (ref 0.0–0.2)

## 2021-07-09 LAB — BRAIN NATRIURETIC PEPTIDE: B Natriuretic Peptide: 231.9 pg/mL — ABNORMAL HIGH (ref 0.0–100.0)

## 2021-07-09 LAB — PROTIME-INR
INR: 1 (ref 0.8–1.2)
Prothrombin Time: 12.7 seconds (ref 11.4–15.2)

## 2021-07-09 LAB — RESP PANEL BY RT-PCR (FLU A&B, COVID) ARPGX2
Influenza A by PCR: NEGATIVE
Influenza B by PCR: NEGATIVE
SARS Coronavirus 2 by RT PCR: NEGATIVE

## 2021-07-09 LAB — MAGNESIUM: Magnesium: 2.4 mg/dL (ref 1.7–2.4)

## 2021-07-09 LAB — APTT: aPTT: 27 seconds (ref 24–36)

## 2021-07-09 LAB — TROPONIN I (HIGH SENSITIVITY): Troponin I (High Sensitivity): 1355 ng/L

## 2021-07-09 MED ORDER — NITROGLYCERIN 2 % TD OINT
0.5000 [in_us] | TOPICAL_OINTMENT | Freq: Once | TRANSDERMAL | Status: AC
  Administered 2021-07-09: 0.5 [in_us] via TOPICAL
  Filled 2021-07-09: qty 1

## 2021-07-09 MED ORDER — FUROSEMIDE 10 MG/ML IJ SOLN
40.0000 mg | Freq: Once | INTRAMUSCULAR | Status: AC
  Administered 2021-07-09: 40 mg via INTRAVENOUS
  Filled 2021-07-09: qty 4

## 2021-07-09 MED ORDER — HEPARIN BOLUS VIA INFUSION
4000.0000 [IU] | Freq: Once | INTRAVENOUS | Status: AC
  Administered 2021-07-09: 4000 [IU] via INTRAVENOUS
  Filled 2021-07-09: qty 4000

## 2021-07-09 MED ORDER — NITROGLYCERIN 2 % TD OINT: 1.0000 [in_us] | TOPICAL_OINTMENT | Freq: Once | TRANSDERMAL | Status: DC

## 2021-07-09 MED ORDER — HEPARIN (PORCINE) 25000 UT/250ML-% IV SOLN
1200.0000 [IU]/h | INTRAVENOUS | Status: DC
  Administered 2021-07-09: 1200 [IU]/h via INTRAVENOUS
  Filled 2021-07-09: qty 250

## 2021-07-09 NOTE — ED Triage Notes (Signed)
Pt prsesnts via EMS with complaints of CP & SOB following dinner at [facility]. He states he has a significant cardiac hx with stent placement. Prior to arrival the patient had received 324mg  ASA from EMS and he had taken 3 nitro tablets at home without improvement to his CP. Pt was originally 90% on RA with EMS and he was placed on 4L [place] which improved his O2 sat to 98%.

## 2021-07-09 NOTE — ED Provider Notes (Signed)
[facility] Emergency Department Provider Note  ____________________________________________   Event Date/Time   First MD Initiated Contact with Patient 07/09/21 2121     (approximate)  I have reviewed the triage vital signs and the nursing notes.   HISTORY  Chief Complaint Chest Pain and Shortness of Breath    HPI Bradley [name] is a 80 y.o. male  with h/o HTN, HLD, DM here with chest pain, SOB. Pt reports he was in his usual state of health this evening,went to eat dinner and was feeling fine. On the way home, he experienced acute onset of severe aching, pressure like chest pain along with SOB. Felt like he was winded just sitting in his car. He was able to drive home, but had to take almost an hour getting inside the house 2/2 his SOB. Pt took 3 nitro at home with minimal relief. EMS called, pt given ASA 325 as well. Pt states his pain is gradually improving, but persistent - 5/10 now. He has had similar sx with an NSTEMI in the past. Denis any pain prior to this or DOE, orthopnea over past few days. No fevers or chills.       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 Authorizing Provider  amLODipine (NORVASC) 10 MG tablet Take 10 mg by mouth daily.     [provider]  aspirin  EC 81 MG tablet Take 1 tablet by mouth daily.    [provider]  Carboxymethylcellulose Sodium (THERATEARS) 0.25 % SOLN Apply 1 drop to eye 4 (four) times daily.    [provider]  furosemide (LASIX) 20 MG tablet Take 20 mg by mouth daily.  10/09/14 03/13/18  [provider]  glipiZIDE (GLUCOTROL) 5 MG tablet Take 5 mg by mouth 2 (two) times daily before a meal.  10/09/14 03/13/18  [provider]  hydrochlorothiazide (HYDRODIURIL) 25 MG tablet Take 25 mg by mouth at bedtime.     [provider]  indomethacin (INDOCIN) 50 MG capsule Take 50 mg by mouth daily.     [provider]  isosorbide mononitrate (IMDUR) 60 MG 24 hr tablet Take 60 mg by mouth daily.     [provider]  lisinopril (PRINIVIL,ZESTRIL) 40 MG tablet Take 40 mg by mouth daily.     [provider]  metFORMIN (GLUCOPHAGE) 1000 MG tablet Take by mouth. 10/09/14 10/09/15  [provider]  metoprolol tartrate (LOPRESSOR) 25 MG tablet Take by mouth. 10/09/14 10/09/15  [provider]  omeprazole (PRILOSEC) 20 MG capsule Take 40 mg by mouth daily.     [provider]  travoprost, benzalkonium, (TRAVATAN) 0.004 % ophthalmic solution Place 1 drop into both eyes nightly.    [provider]    Allergies Simvastatin  No  family history on file.  Social History Social History   Tobacco Use   Smoking status: Former   Smokeless tobacco: Never  Substance Use Topics   Alcohol use: Never   Drug use: Never    Review of Systems  Review of Systems  Constitutional:  Positive for fatigue. Negative for chills and fever.  HENT:  Negative for sore throat.   Respiratory:  Positive for cough and shortness of breath.   Cardiovascular:  Negative for chest pain.  Gastrointestinal:  Negative for abdominal pain.  Genitourinary:  Negative for flank pain.  Musculoskeletal:  Negative for neck pain.  Skin:  Negative for rash and wound.   Allergic/Immunologic: Negative for immunocompromised state.  Neurological:  Negative for numbness.  Hematological:  Does not bruise/bleed easily.    ____________________________________________  PHYSICAL EXAM:      VITAL SIGNS: ED Triage Vitals  Enc Vitals Group     BP 07/09/21 2123 (!) 167/94     Pulse Rate 07/09/21 2123 (!) 110     Resp 07/09/21 2123 20     Temp 07/09/21 2123 97.9 F (36.6 C)     Temp Source 07/09/21 2123 Oral     SpO2 07/09/21 2118 95 %     Weight 07/09/21 2124 201 lb 15.1 oz (91.6 kg)     Height 07/09/21 2124 6' (1.829 m)     Head Circumference --      Peak Flow --      Pain Score 07/09/21 2124 7     Pain Loc --      Pain Edu? --      Excl. in GC? --      Physical Exam Vitals and nursing note reviewed.  Constitutional:      General: He is not in acute distress.    Appearance: He is well-developed.  HENT:     Head: Normocephalic and atraumatic.  Eyes:     Conjunctiva/sclera: Conjunctivae normal.  Cardiovascular:     Rate and Rhythm: Regular rhythm. Tachycardia present.     Heart sounds: Normal heart sounds. No murmur heard.   No friction rub.  Pulmonary:     Effort: Pulmonary effort is normal. Tachypnea present. No respiratory distress.     Breath sounds: Examination of the right-middle field reveals rales. Examination of the left-middle field reveals rales. Examination of the right-lower field reveals rales. Examination of the left-lower field reveals rales. Rales present. No wheezing.  Abdominal:     General: There is no distension.     Palpations: Abdomen is soft.     Tenderness: There is no abdominal tenderness.  Musculoskeletal:     Cervical back: Neck supple.     Right lower leg: No edema.     Left lower leg: No edema.  Skin:    General: Skin is warm.     Capillary Refill: Capillary refill takes less than 2 seconds.  Neurological:     Mental Status: He is alert and oriented to person, place, and time.     Motor: No abnormal muscle  tone.      ____________________________________________   LABS (all labs ordered are listed, but only abnormal results are displayed)  Labs Reviewed  CBC WITH DIFFERENTIAL/PLATELET - Abnormal; Notable for the following components:      Result Value   WBC 20.5 (*)    Neutro Abs 16.7 (*)    Monocytes Absolute 1.7 (*)    Abs Immature Granulocytes 0.15 (*)    All other components within  normal limits  COMPREHENSIVE METABOLIC PANEL - Abnormal; Notable for the following components:   Sodium 133 (*)    Potassium 5.3 (*)    CO2 21 (*)    Glucose, Bld 370 (*)    BUN 31 (*)    Creatinine, Ser 1.93 (*)    GFR, Estimated 35 (*)    All other components within normal limits  BRAIN NATRIURETIC PEPTIDE - Abnormal; Notable for the following components:   B Natriuretic Peptide 231.9 (*)    All other components within normal limits  TROPONIN I (HIGH SENSITIVITY) - Abnormal; Notable for the following components:   Troponin I (High Sensitivity) 1,355 (*)    All other components within normal limits  RESP PANEL BY RT-PCR (FLU A&B, COVID) ARPGX2  MAGNESIUM  APTT  PROTIME-INR  TROPONIN I (HIGH SENSITIVITY)    ____________________________________________  EKG: Sinus tachycardia, VR 109. PR 178, QRS 106, QTc 456. ST elevation aVR, diffuse ST depressions, concerning for ischemia/global ischemia. No concordant ST elevations. ________________________________________  RADIOLOGY All imaging, including plain films, CT scans, and ultrasounds, independently reviewed by me, and interpretations confirmed via formal radiology reads.  ED MD interpretation:   CXR: Bilateral pulmonary edema, CHF  Official radiology report(s): DG Chest Portable 1 View  Result Date: 07/09/2021 CLINICAL DATA:  Shortness of breath and chest pain. EXAM: PORTABLE CHEST 1 VIEW COMPARISON:  03/13/2018 FINDINGS: Post median sternotomy. Mild cardiomegaly. Mild interstitial and septal thickening typical of pulmonary edema. There  may be trace pleural effusions. No pneumothorax. Patchy bibasilar opacities favor atelectasis. IMPRESSION: Cardiomegaly with mild pulmonary edema. Possible trace pleural effusions. Findings most consistent with CHF. Electronically Signed   By: Narda Rutherford M.D.   On: 07/09/2021 21:50    ____________________________________________  PROCEDURES   Procedure(s) performed (including Critical Care):  Procedures  ____________________________________________  INITIAL IMPRESSION / MDM / ASSESSMENT AND PLAN / ED COURSE  As part of my medical decision making, I reviewed the following data within the electronic MEDICAL RECORD NUMBER Nursing notes reviewed and incorporated, Old chart reviewed, Notes from prior ED visits, and [place] Controlled Substance Database       *Reyhan Moronta was evaluated in Emergency Department on 07/10/2021 for the symptoms described in the history of present illness. He was evaluated in the context of the global COVID-19 pandemic, which necessitated consideration that the patient might be at risk for infection with the SARS-CoV-2 virus that causes COVID-19. Institutional protocols and algorithms that pertain to the evaluation of patients at risk for COVID-19 are in a state of rapid change based on information released by regulatory bodies including the CDC and federal and state organizations. These policies and algorithms were followed during the patient's care in the ED.  Some ED evaluations and interventions may be delayed as a result of limited staffing during the pandemic.*     Medical Decision Making:  80 yo M here with acute onset CP, SOB. Pt in moderate resp distress on arrival, hypoxic, with ongoing chest pain. S/p ASA 325, nitro x 3. Suspect acute flash edema, possible NSTEMI. EKG shows aVR elevation, diffuse ST depressions c/f global ischemia. Does not meet STEMI criteria. Stat CXR shows pulmonary edema. Labs obtained, reviewed as above. Leukocytosis of 20k likely reactive -  no signs of infection, was feeling well prior to the CP today. No sick contacts. Trop 1355 and BNP 232.   CP improving with nitro, heparin, aspirin. Will admit to medicine.   10:20 PM: Dr. Lalla Brothers with Cardiology paged, unable to reach.  10:45 PM: Dr. Lalla Brothers page x 2, secure message sent 11:00 PM: Pain improving, hospitalist paged 11:30 PM: Dr. Lalla Brothers page x 3, secure message, Dr. Kirke Corin paged. 11:50 PM: Dr. Kirke Corin paged x 2, secure message sent.  11:59 PM: Dr. Para March called back, will admit.   ____________________________________________  FINAL CLINICAL IMPRESSION(S) / ED DIAGNOSES  Final diagnoses:  NSTEMI (non-ST elevated myocardial infarction) (HCC)  Flash pulmonary edema (HCC)     MEDICATIONS GIVEN DURING THIS VISIT:  Medications  heparin ADULT infusion 100 units/mL (25000 units/219mL) (1,200 Units/hr Intravenous New Bag/Given 07/09/21 2332)  nitroGLYCERIN (NITROGLYN) 2 % ointment 0.5 inch (0.5 inches Topical Given 07/09/21 2145)  furosemide (LASIX) injection 40 mg (40 mg Intravenous Given 07/09/21 2221)  heparin bolus via infusion 4,000 Units (4,000 Units Intravenous Bolus from Bag 07/09/21 2333)     ED Discharge Orders     None        Note:  This document was prepared using Dragon voice recognition software and may include unintentional dictation errors.   Shaune Pollack, MD 07/10/21 0000

## 2021-07-09 NOTE — Progress Notes (Signed)
ANTICOAGULATION CONSULT NOTE  Pharmacy Consult for heparin infusion Indication: ACS/STEMI  Allergies  Allergen Reactions   Simvastatin     Patient Measurements: Height: 6' (182.9 cm) Weight: 91.6 kg (201 lb 15.1 oz) IBW/kg (Calculated) : 77.6 Heparin Dosing Weight: 91.6 kg  Vital Signs: Temp: 97.9 F (36.6 C) (11/14 2123) Temp Source: Oral (11/14 2123) BP: 151/71 (11/14 2230) Pulse Rate: 99 (11/14 2230)  Labs: Recent Labs    07/09/21 2130  HGB 15.0  HCT 45.8  PLT 325  CREATININE 1.93*  TROPONINIHS 1,355*    Estimated Creatinine Clearance: 33.5 mL/min (A) (by C-G formula based on SCr of 1.93 mg/dL (H)).   Medical History: Past Medical History:  Diagnosis Date   Diabetes mellitus without complication (HCC)    Hyperlipidemia    Hypertension     Assessment: Pt is 80 yo male presenting to ED found with elevated Troponin I level.  Goal of Therapy:  Heparin level 0.3-0.7 units/ml Monitor platelets by anticoagulation protocol: Yes   Plan:  Bolus 4000 units x 1 Start heparin infusion at 1200 unit/hr Check HL in 8 hr after start of infusion CBC daily while on heparin  Otelia Sergeant, PharmD, [facility] 07/09/2021 10:56 PM

## 2021-07-10 ENCOUNTER — Other Ambulatory Visit: Payer: Self-pay

## 2021-07-10 ENCOUNTER — Encounter
Admission: EM | Disposition: A | Discharge status: Home / Self Care | Payer: Self-pay | Source: Home / Self Care | Attending: Hospitalist

## 2021-07-10 ENCOUNTER — Encounter: Payer: Self-pay | Admitting: Internal Medicine

## 2021-07-10 ENCOUNTER — Inpatient Hospital Stay (HOSPITAL_COMMUNITY)
Admit: 2021-07-10 | Discharge: 2021-07-10 | Disposition: A | Discharge status: Home / Self Care | Payer: No Typology Code available for payment source | Attending: Internal Medicine | Admitting: Internal Medicine

## 2021-07-10 DIAGNOSIS — I5031 Acute diastolic (congestive) heart failure: Secondary | ICD-10-CM | POA: Diagnosis not present

## 2021-07-10 DIAGNOSIS — I5033 Acute on chronic diastolic (congestive) heart failure: Secondary | ICD-10-CM

## 2021-07-10 DIAGNOSIS — Z7984 Long term (current) use of oral hypoglycemic drugs: Secondary | ICD-10-CM | POA: Diagnosis not present

## 2021-07-10 DIAGNOSIS — I255 Ischemic cardiomyopathy: Secondary | ICD-10-CM | POA: Diagnosis present

## 2021-07-10 DIAGNOSIS — I77810 Thoracic aortic ectasia: Secondary | ICD-10-CM | POA: Diagnosis present

## 2021-07-10 DIAGNOSIS — I2510 Atherosclerotic heart disease of native coronary artery without angina pectoris: Secondary | ICD-10-CM | POA: Diagnosis present

## 2021-07-10 DIAGNOSIS — N179 Acute kidney failure, unspecified: Secondary | ICD-10-CM | POA: Diagnosis present

## 2021-07-10 DIAGNOSIS — Z951 Presence of aortocoronary bypass graft: Secondary | ICD-10-CM | POA: Diagnosis not present

## 2021-07-10 DIAGNOSIS — Z7982 Long term (current) use of aspirin: Secondary | ICD-10-CM | POA: Diagnosis not present

## 2021-07-10 DIAGNOSIS — J810 Acute pulmonary edema: Secondary | ICD-10-CM | POA: Diagnosis present

## 2021-07-10 DIAGNOSIS — N1831 Chronic kidney disease, stage 3a: Secondary | ICD-10-CM | POA: Diagnosis present

## 2021-07-10 DIAGNOSIS — E1122 Type 2 diabetes mellitus with diabetic chronic kidney disease: Secondary | ICD-10-CM | POA: Diagnosis present

## 2021-07-10 DIAGNOSIS — Z87891 Personal history of nicotine dependence: Secondary | ICD-10-CM | POA: Diagnosis not present

## 2021-07-10 DIAGNOSIS — I130 Hypertensive heart and chronic kidney disease with heart failure and stage 1 through stage 4 chronic kidney disease, or unspecified chronic kidney disease: Secondary | ICD-10-CM | POA: Diagnosis present

## 2021-07-10 DIAGNOSIS — Z20822 Contact with and (suspected) exposure to covid-19: Secondary | ICD-10-CM | POA: Diagnosis present

## 2021-07-10 DIAGNOSIS — I10 Essential (primary) hypertension: Secondary | ICD-10-CM | POA: Diagnosis not present

## 2021-07-10 DIAGNOSIS — I252 Old myocardial infarction: Secondary | ICD-10-CM | POA: Diagnosis not present

## 2021-07-10 DIAGNOSIS — I25810 Atherosclerosis of coronary artery bypass graft(s) without angina pectoris: Secondary | ICD-10-CM | POA: Diagnosis present

## 2021-07-10 DIAGNOSIS — M545 Low back pain, unspecified: Secondary | ICD-10-CM | POA: Diagnosis present

## 2021-07-10 DIAGNOSIS — I5043 Acute on chronic combined systolic (congestive) and diastolic (congestive) heart failure: Secondary | ICD-10-CM | POA: Diagnosis present

## 2021-07-10 DIAGNOSIS — G8929 Other chronic pain: Secondary | ICD-10-CM | POA: Diagnosis present

## 2021-07-10 DIAGNOSIS — I214 Non-ST elevation (NSTEMI) myocardial infarction: Secondary | ICD-10-CM | POA: Diagnosis present

## 2021-07-10 DIAGNOSIS — E785 Hyperlipidemia, unspecified: Secondary | ICD-10-CM | POA: Diagnosis present

## 2021-07-10 DIAGNOSIS — N189 Chronic kidney disease, unspecified: Secondary | ICD-10-CM

## 2021-07-10 DIAGNOSIS — I2582 Chronic total occlusion of coronary artery: Secondary | ICD-10-CM | POA: Diagnosis present

## 2021-07-10 DIAGNOSIS — Z79899 Other long term (current) drug therapy: Secondary | ICD-10-CM | POA: Diagnosis not present

## 2021-07-10 DIAGNOSIS — E1165 Type 2 diabetes mellitus with hyperglycemia: Secondary | ICD-10-CM | POA: Diagnosis present

## 2021-07-10 DIAGNOSIS — Z888 Allergy status to other drugs, medicaments and biological substances status: Secondary | ICD-10-CM | POA: Diagnosis not present

## 2021-07-10 HISTORY — PX: LEFT HEART CATH AND CORS/GRAFTS ANGIOGRAPHY: CATH118250

## 2021-07-10 LAB — ECHOCARDIOGRAM COMPLETE
AR max vel: 1.68 cm2
AV Area VTI: 1.85 cm2
AV Area mean vel: 1.77 cm2
AV Mean grad: 8 mmHg
AV Peak grad: 15.1 mmHg
Ao pk vel: 1.94 m/s
Area-P 1/2: 4.96 cm2
Height: 72 in
MV VTI: 2.63 cm2
P 1/2 time: 498 msec
S' Lateral: 3.4 cm
Weight: 3231.06 oz

## 2021-07-10 LAB — LIPID PANEL
Cholesterol: 129 mg/dL (ref 0–200)
HDL: 51 mg/dL (ref >40)
LDL Cholesterol: 52 mg/dL (ref 0–99)
Total CHOL/HDL Ratio: 2.5 RATIO
Triglycerides: 128 mg/dL (ref <150)
VLDL: 26 mg/dL (ref 0–40)

## 2021-07-10 LAB — HEMOGLOBIN A1C
Hgb A1c MFr Bld: 8.6 % — ABNORMAL HIGH (ref 4.8–5.6)
Mean Plasma Glucose: 200.12 mg/dL

## 2021-07-10 LAB — CBG MONITORING, ED
Glucose-Capillary: 252 mg/dL — ABNORMAL HIGH (ref 70–99)
Glucose-Capillary: 129 mg/dL — ABNORMAL HIGH (ref 70–99)
Glucose-Capillary: 131 mg/dL — ABNORMAL HIGH (ref 70–99)

## 2021-07-10 LAB — GLUCOSE, CAPILLARY
Glucose-Capillary: 125 mg/dL — ABNORMAL HIGH (ref 70–99)
Glucose-Capillary: 281 mg/dL — ABNORMAL HIGH (ref 70–99)
Glucose-Capillary: 236 mg/dL — ABNORMAL HIGH (ref 70–99)
Glucose-Capillary: 176 mg/dL — ABNORMAL HIGH (ref 70–99)

## 2021-07-10 LAB — CBC
HCT: 47.8 % (ref 39.0–52.0)
Hemoglobin: 15.9 g/dL (ref 13.0–17.0)
MCH: 30.9 pg (ref 26.0–34.0)
MCHC: 33.3 g/dL (ref 30.0–36.0)
MCV: 92.8 fL (ref 80.0–100.0)
Platelets: 314 10*3/uL (ref 150–400)
RBC: 5.15 MIL/uL (ref 4.22–5.81)
RDW: 14 % (ref 11.5–15.5)
WBC: 14.3 10*3/uL — ABNORMAL HIGH (ref 4.0–10.5)
nRBC: 0 % (ref 0.0–0.2)

## 2021-07-10 LAB — BASIC METABOLIC PANEL
Anion gap: 11 (ref 5–15)
BUN: 25 mg/dL — ABNORMAL HIGH (ref 8–23)
CO2: 24 mmol/L (ref 22–32)
Calcium: 9.4 mg/dL (ref 8.9–10.3)
Chloride: 103 mmol/L (ref 98–111)
Creatinine, Ser: 1.43 mg/dL — ABNORMAL HIGH (ref 0.61–1.24)
GFR, Estimated: 50 mL/min — ABNORMAL LOW (ref >60)
Glucose, Bld: 118 mg/dL — ABNORMAL HIGH (ref 70–99)
Potassium: 3.9 mmol/L (ref 3.5–5.1)
Sodium: 138 mmol/L (ref 135–145)

## 2021-07-10 LAB — TROPONIN I (HIGH SENSITIVITY)
Troponin I (High Sensitivity): 14103 ng/L (ref <18)
Troponin I (High Sensitivity): >24000 ng/L (ref <18)

## 2021-07-10 LAB — HEPARIN LEVEL (UNFRACTIONATED): Heparin Unfractionated: 0.61 IU/mL (ref 0.30–0.70)

## 2021-07-10 SURGERY — LEFT HEART CATH AND CORS/GRAFTS ANGIOGRAPHY
Anesthesia: Moderate Sedation

## 2021-07-10 MED ORDER — ATORVASTATIN CALCIUM 20 MG PO TABS
40.0000 mg | ORAL_TABLET | Freq: Every day | ORAL | Status: DC
  Administered 2021-07-11: 40 mg via ORAL
  Filled 2021-07-10: qty 2

## 2021-07-10 MED ORDER — TIMOLOL MALEATE 0.5 % OP SOLG
1.0000 [drp] | Freq: Every day | OPHTHALMIC | Status: DC
Start: 2021-07-11 — End: 2021-07-11
  Filled 2021-07-10: qty 5

## 2021-07-10 MED ORDER — HEPARIN SODIUM (PORCINE) 1000 UNIT/ML IJ SOLN
INTRAMUSCULAR | Status: DC | PRN
  Administered 2021-07-10: 4500 [IU] via INTRAVENOUS

## 2021-07-10 MED ORDER — CLOPIDOGREL BISULFATE 75 MG PO TABS
ORAL_TABLET | ORAL | Status: AC
  Administered 2021-07-10: 300 mg via ORAL
  Filled 2021-07-10: qty 4

## 2021-07-10 MED ORDER — SODIUM CHLORIDE 0.9% FLUSH: 3.0000 mL | Freq: Two times a day (BID) | INTRAVENOUS | Status: DC

## 2021-07-10 MED ORDER — CARVEDILOL 3.125 MG PO TABS
3.1250 mg | ORAL_TABLET | Freq: Two times a day (BID) | ORAL | Status: DC
  Administered 2021-07-10 – 2021-07-11 (×3): 3.125 mg via ORAL
  Filled 2021-07-10 (×3): qty 1

## 2021-07-10 MED ORDER — SODIUM CHLORIDE 0.9 % IV SOLN: 250.0000 mL | INTRAVENOUS | Status: DC | PRN

## 2021-07-10 MED ORDER — SODIUM CHLORIDE 0.9 % IV SOLN: INTRAVENOUS | Status: DC

## 2021-07-10 MED ORDER — CLOPIDOGREL BISULFATE 75 MG PO TABS
75.0000 mg | ORAL_TABLET | Freq: Every day | ORAL | Status: DC
  Administered 2021-07-11: 75 mg via ORAL
  Filled 2021-07-10: qty 1

## 2021-07-10 MED ORDER — ASPIRIN 81 MG PO CHEW
CHEWABLE_TABLET | ORAL | Status: AC
  Filled 2021-07-10: qty 1

## 2021-07-10 MED ORDER — INSULIN ASPART 100 UNIT/ML IJ SOLN
0.0000 [IU] | Freq: Three times a day (TID) | INTRAMUSCULAR | Status: DC
  Administered 2021-07-11 (×2): 3 [IU] via SUBCUTANEOUS
  Filled 2021-07-10 (×2): qty 1

## 2021-07-10 MED ORDER — LABETALOL HCL 5 MG/ML IV SOLN: 10.0000 mg | INTRAVENOUS | Status: AC | PRN

## 2021-07-10 MED ORDER — BRIMONIDINE TARTRATE 0.2 % OP SOLN
1.0000 [drp] | Freq: Three times a day (TID) | OPHTHALMIC | Status: DC
  Administered 2021-07-10 – 2021-07-11 (×2): 1 [drp] via OPHTHALMIC
  Filled 2021-07-10: qty 5

## 2021-07-10 MED ORDER — ISOSORBIDE MONONITRATE ER 30 MG PO TB24
15.0000 mg | ORAL_TABLET | Freq: Every day | ORAL | Status: DC
  Administered 2021-07-10 – 2021-07-11 (×2): 15 mg via ORAL
  Filled 2021-07-10 (×3): qty 1

## 2021-07-10 MED ORDER — ACETAMINOPHEN 325 MG PO TABS: 650.0000 mg | ORAL_TABLET | ORAL | Status: DC | PRN

## 2021-07-10 MED ORDER — TRAVOPROST (BAK FREE) 0.004 % OP SOLN
1.0000 [drp] | Freq: Every day | OPHTHALMIC | Status: DC
  Administered 2021-07-10: 1 [drp] via OPHTHALMIC
  Filled 2021-07-10: qty 2.5

## 2021-07-10 MED ORDER — FENTANYL CITRATE (PF) 100 MCG/2ML IJ SOLN
INTRAMUSCULAR | Status: DC | PRN
  Administered 2021-07-10: 25 ug via INTRAVENOUS

## 2021-07-10 MED ORDER — FENTANYL CITRATE (PF) 100 MCG/2ML IJ SOLN
INTRAMUSCULAR | Status: AC
  Filled 2021-07-10: qty 2

## 2021-07-10 MED ORDER — ENOXAPARIN SODIUM 40 MG/0.4ML IJ SOSY
40.0000 mg | PREFILLED_SYRINGE | INTRAMUSCULAR | Status: DC
  Administered 2021-07-11: 40 mg via SUBCUTANEOUS
  Filled 2021-07-10: qty 0.4

## 2021-07-10 MED ORDER — LIDOCAINE HCL 1 % IJ SOLN
INTRAMUSCULAR | Status: AC
  Filled 2021-07-10: qty 20

## 2021-07-10 MED ORDER — NITROGLYCERIN 0.4 MG SL SUBL: 0.4000 mg | SUBLINGUAL_TABLET | SUBLINGUAL | Status: DC | PRN

## 2021-07-10 MED ORDER — ASPIRIN EC 81 MG PO TBEC
81.0000 mg | DELAYED_RELEASE_TABLET | Freq: Every day | ORAL | Status: DC
  Administered 2021-07-11: 81 mg via ORAL
  Filled 2021-07-10: qty 1

## 2021-07-10 MED ORDER — ONDANSETRON HCL 4 MG/2ML IJ SOLN: 4.0000 mg | Freq: Four times a day (QID) | INTRAMUSCULAR | Status: DC | PRN

## 2021-07-10 MED ORDER — CLOPIDOGREL BISULFATE 75 MG PO TABS
300.0000 mg | ORAL_TABLET | Freq: Once | ORAL | Status: AC
Start: 2021-07-10 — End: 2021-07-10

## 2021-07-10 MED ORDER — INSULIN ASPART 100 UNIT/ML IJ SOLN
0.0000 [IU] | INTRAMUSCULAR | Status: DC
Start: 2021-07-10 — End: 2021-07-10
  Administered 2021-07-10 (×2): 2 [IU] via SUBCUTANEOUS
  Administered 2021-07-10: 8 [IU] via SUBCUTANEOUS
  Administered 2021-07-10: 5 [IU] via SUBCUTANEOUS
  Administered 2021-07-10: 8 [IU] via SUBCUTANEOUS
  Filled 2021-07-10 (×4): qty 1
  Filled 2021-07-10 (×2): qty 0.08

## 2021-07-10 MED ORDER — FUROSEMIDE 40 MG PO TABS
40.0000 mg | ORAL_TABLET | Freq: Every day | ORAL | Status: DC
  Administered 2021-07-11: 40 mg via ORAL
  Filled 2021-07-10: qty 1

## 2021-07-10 MED ORDER — SODIUM CHLORIDE 0.9% FLUSH: 3.0000 mL | INTRAVENOUS | Status: DC | PRN

## 2021-07-10 MED ORDER — VERAPAMIL HCL 2.5 MG/ML IV SOLN
INTRAVENOUS | Status: DC | PRN
  Administered 2021-07-10: 2.5 mg via INTRA_ARTERIAL

## 2021-07-10 MED ORDER — HEPARIN SODIUM (PORCINE) 1000 UNIT/ML IJ SOLN
INTRAMUSCULAR | Status: AC
  Filled 2021-07-10: qty 1

## 2021-07-10 MED ORDER — MIDAZOLAM HCL 2 MG/2ML IJ SOLN
INTRAMUSCULAR | Status: DC | PRN
  Administered 2021-07-10: 1 mg via INTRAVENOUS

## 2021-07-10 MED ORDER — HEPARIN (PORCINE) IN NACL 1000-0.9 UT/500ML-% IV SOLN
INTRAVENOUS | Status: AC
  Filled 2021-07-10: qty 1000

## 2021-07-10 MED ORDER — BRINZOLAMIDE 1 % OP SUSP
1.0000 [drp] | Freq: Three times a day (TID) | OPHTHALMIC | Status: DC
  Administered 2021-07-10 – 2021-07-11 (×2): 1 [drp] via OPHTHALMIC
  Filled 2021-07-10: qty 10

## 2021-07-10 MED ORDER — HEPARIN (PORCINE) IN NACL 2000-0.9 UNIT/L-% IV SOLN
INTRAVENOUS | Status: DC | PRN
  Administered 2021-07-10: 1000 mL

## 2021-07-10 MED ORDER — FUROSEMIDE 10 MG/ML IJ SOLN
40.0000 mg | Freq: Two times a day (BID) | INTRAMUSCULAR | Status: DC
  Administered 2021-07-10: 40 mg via INTRAVENOUS
  Filled 2021-07-10: qty 4

## 2021-07-10 MED ORDER — IOHEXOL 350 MG/ML SOLN
INTRAVENOUS | Status: DC | PRN
  Administered 2021-07-10: 54 mL

## 2021-07-10 MED ORDER — ASPIRIN 81 MG PO CHEW
81.0000 mg | CHEWABLE_TABLET | ORAL | Status: AC
  Administered 2021-07-10: 81 mg via ORAL

## 2021-07-10 MED ORDER — PANTOPRAZOLE SODIUM 40 MG PO TBEC
40.0000 mg | DELAYED_RELEASE_TABLET | Freq: Every day | ORAL | Status: DC
  Administered 2021-07-11: 40 mg via ORAL
  Filled 2021-07-10: qty 1

## 2021-07-10 MED ORDER — LIDOCAINE HCL (PF) 1 % IJ SOLN
INTRAMUSCULAR | Status: DC | PRN
  Administered 2021-07-10: 2 mL

## 2021-07-10 MED ORDER — ISOSORBIDE MONONITRATE 15 MG HALF TABLET: 15.0000 mg | ORAL_TABLET | Freq: Every day | ORAL | Status: DC

## 2021-07-10 MED ORDER — HYDRALAZINE HCL 20 MG/ML IJ SOLN: 10.0000 mg | INTRAMUSCULAR | Status: AC | PRN

## 2021-07-10 MED ORDER — SODIUM CHLORIDE 0.9% FLUSH
3.0000 mL | Freq: Two times a day (BID) | INTRAVENOUS | Status: DC
  Administered 2021-07-10: 3 mL via INTRAVENOUS

## 2021-07-10 MED ORDER — INSULIN ASPART 100 UNIT/ML IJ SOLN: 0.0000 [IU] | Freq: Every day | INTRAMUSCULAR | Status: DC

## 2021-07-10 MED ORDER — VERAPAMIL HCL 2.5 MG/ML IV SOLN
INTRAVENOUS | Status: AC
  Filled 2021-07-10: qty 2

## 2021-07-10 MED ORDER — PERFLUTREN LIPID MICROSPHERE
1.0000 mL | INTRAVENOUS | Status: AC | PRN
Start: 2021-07-10 — End: 2021-07-10
  Administered 2021-07-10: 2 mL via INTRAVENOUS
  Filled 2021-07-10: qty 10

## 2021-07-10 MED ORDER — MIDAZOLAM HCL 2 MG/2ML IJ SOLN
INTRAMUSCULAR | Status: AC
  Filled 2021-07-10: qty 2

## 2021-07-10 SURGICAL SUPPLY — 12 items
CATH INFINITI 5 FR IM (CATHETERS) ×2 IMPLANT
CATH INFINITI 5 FR MPA2 (CATHETERS) ×2 IMPLANT
CATH INFINITI 5FR JL4 (CATHETERS) ×2 IMPLANT
DEVICE RAD TR BAND REGULAR (VASCULAR PRODUCTS) ×2 IMPLANT
DRAPE BRACHIAL (DRAPES) ×2 IMPLANT
GLIDESHEATH SLEND SS 6F .021 (SHEATH) ×2 IMPLANT
GUIDEWIRE INQWIRE 1.5J.035X260 (WIRE) IMPLANT
INQWIRE 1.5J .035X260CM (WIRE) ×3
PACK CARDIAC CATH (CUSTOM PROCEDURE TRAY) ×3 IMPLANT
PROTECTION STATION PRESSURIZED (MISCELLANEOUS) ×3
SET ATX SIMPLICITY (MISCELLANEOUS) ×2 IMPLANT
STATION PROTECTION PRESSURIZED (MISCELLANEOUS) IMPLANT

## 2021-07-10 NOTE — Brief Op Note (Addendum)
BRIEF CARDIAC CATHETERIZATION NOTE  DATE: 07/10/2021  TIME: 11:27 AM  PATIENT:  Severiano Gilbert  79 y.o. male  PRE-OPERATIVE DIAGNOSIS:  NSTEMI  POST-OPERATIVE DIAGNOSIS:  NSTEMI  PROCEDURE:  Procedure(s): LEFT HEART CATH AND CORS/GRAFTS ANGIOGRAPHY (N/A)  SURGEON:  Surgeon(s) and Role:    * Rana Hochstein, MD - Primary  FINDINGS: Severe native coronary artery disease with 90% distal LMCA stenosis, diffuse proximal LAD disease with functional mid vessel occlusion with competitive flow from LIMA-LAD, 99% proximal OM1 stenosis, and CTO of proximal RCA. Widely patent LIMA-LAD and SVG-OM3. Patent SVG-RPDA with 70% stenosis near distal anastomosis.  RPDA distal to SVG anastomosis is occluded (? culprit lesion for NSTEMI). Mildly elevated left ventricular filling pressure. Mild-moderate aortic valve stenosis (peak-to-peak gradient ~20 mmHg).   Recommendations: Medical therapy.  Would only consider PCI to distal anastomosis of SVG-RPDA for refractory symptoms. Load with clopidogrel 300 mg daily followed by 75 mg daily thereafter for 12 months. Aggressive secondary prevention and initiation of goal-directed medical therapy for acute systolic heart failure.  Yvonne Kendall, MD [facility]

## 2021-07-10 NOTE — Interval H&P Note (Signed)
History and Physical Interval Note:  07/10/2021 10:15 AM  Bradley [name]  has presented today for surgery, with the diagnosis of NSTEMI.  The various methods of treatment have been discussed with the patient and family. After consideration of risks, benefits and other options for treatment, the patient has consented to  Procedure(s): LEFT HEART CATH AND CORS/GRAFTS ANGIOGRAPHY (N/A) as a surgical intervention.  The patient's history has been reviewed, patient examined, no change in status, stable for surgery.  I have reviewed the patient's chart and labs.  Questions were answered to the patient's satisfaction.    Cath Lab Visit (complete for each Cath Lab visit)  Clinical Evaluation Leading to the Procedure:   ACS: Yes.    Non-ACS:  N/A  Loxley Schmale

## 2021-07-10 NOTE — H&P (Signed)
History and Physical    Bradley [name] [id number] DOB: 05-Oct-1940 DOA: 07/09/2021  PCP: Apolonio Schneiders, MD   Patient coming from: home  I have personally briefly reviewed patient's relevant medical records in [facility]  Chief Complaint: Chest pain.  HPI: Bradley [name] is a 80 y.o. male with medical history significant for CAD s/p CABG 2016, HTN, DM, CKD 3 and diastolic CHF, followed at the [place] who presents to the ED with sudden onset chest pain and shortness of breath following a meal.  The pain was of severe intensity, exertional, retrosternal, nonradiating associated with nausea.  He took 3 nitros at home without improvement.  On arrival of EMS they administered chewable aspirin and placed him on O2 at 4 L for an O2 sat of 90%.  His pain was down to a 5 out of 10 by arrival.  He denies cough, fever or chills, abdominal pain or change in bowel habits or feeling unwell prior to the onset of symptoms.  He did endorse some orthopnea in the few days prior.  ED course: On arrival BP 167/94 with pulse of 110 and O2 sat 95% on 4 L Blood work: Troponin 1355 and BNP 231 Creatinine 1.93, baseline unknown WBC 20,000  EKG, personally viewed and interpreted: Sinus tachycardia at 109 with diffuse ST depressions  Chest x-ray with findings consistent with CHF  Patient started on a heparin infusion and given a dose of Lasix.  Pain had improved to 1 out of 10 and patient was symptomatically improved by admission.  Hospitalist consulted.      Review of Systems: As per HPI otherwise all other systems on review of systems negative.    Past Medical History:  Diagnosis Date   Diabetes mellitus without complication ([place])    Hyperlipidemia    Hypertension     Past Surgical History:  Procedure Laterality Date   CHOLECYSTECTOMY     CORONARY ARTERY BYPASS GRAFT       reports that he has quit smoking. He has never used smokeless tobacco. He reports that he does not drink alcohol and does not  use drugs.  Allergies  Allergen Reactions   Simvastatin     No family history on file.  Family history reviewed and not pertinent   Prior to Admission medications   Medication Sig Start Date End Date Taking? Authorizing Provider  amLODipine (NORVASC) 10 MG tablet Take 10 mg by mouth daily.     [provider]  aspirin EC 81 MG tablet Take 1 tablet by mouth daily.    [provider]  Carboxymethylcellulose Sodium (THERATEARS) 0.25 % SOLN Apply 1 drop to eye 4 (four) times daily.    [provider]  furosemide (LASIX) 20 MG tablet Take 20 mg by mouth daily.  10/09/14 03/13/18  [provider]  glipiZIDE (GLUCOTROL) 5 MG tablet Take 5 mg by mouth 2 (two) times daily before a meal.  10/09/14 03/13/18  [provider]  hydrochlorothiazide (HYDRODIURIL) 25 MG tablet Take 25 mg by mouth at bedtime.     [provider]  indomethacin (INDOCIN) 50 MG capsule Take 50 mg by mouth daily.     [provider]  isosorbide mononitrate (IMDUR) 60 MG 24 hr tablet Take 60 mg by mouth daily.     [provider]  lisinopril (PRINIVIL,ZESTRIL) 40 MG tablet Take 40 mg by mouth daily.     [provider]  metFORMIN (GLUCOPHAGE) 1000 MG tablet Take by mouth. 10/09/14 10/09/15  [provider]  metoprolol tartrate (LOPRESSOR) 25 MG tablet Take by mouth. 10/09/14 10/09/15  [provider]  omeprazole (PRILOSEC) 20 MG capsule Take 40 mg by mouth daily.     [provider]  travoprost, benzalkonium, (TRAVATAN) 0.004 % ophthalmic solution Place 1 drop into both eyes nightly.    [provider]    Physical Exam: Vitals:   07/09/21 2124 07/09/21 2200 07/09/21 2215 07/09/21 2230  BP:  (!) 147/85 (!) 145/77 (!) 151/71  Pulse:  100  99  Resp:  (!) 22 15 15   Temp:      TempSrc:      SpO2:  98%  98%  Weight: 91.6 kg     Height: 6' (1.829 m)      Constitutional: Alert and oriented x 3 . Not in any apparent  distress HEENT:      Head: Normocephalic and atraumatic.         Eyes: PERLA, EOMI, Conjunctivae are normal. Sclera is non-icteric.       Mouth/Throat: Mucous membranes are moist.       Neck: Supple with no signs of meningismus. Cardiovascular: Regular rate and rhythm. No murmurs, gallops, or rubs. 2+ symmetrical distal pulses are present . No JVD. No  LE edema Respiratory: Respiratory effort normal .Lungs sounds clear bilaterally. No wheezes, crackles, or rhonchi.  Gastrointestinal: Soft, non tender, non distended. Positive bowel sounds.  Genitourinary: No CVA tenderness. Musculoskeletal: Nontender with normal range of motion in all extremities. No cyanosis, or erythema of extremities. Neurologic:  Face is symmetric. Moving all extremities. No gross focal neurologic deficits . Skin: Skin is warm, dry.  No rash or ulcers Psychiatric: Mood and affect are appropriate    Labs on Admission: I have personally reviewed following labs and imaging studies  CBC: Recent Labs  Lab 07/09/21 2130  WBC 20.5*  NEUTROABS 16.7*  HGB 15.0  HCT 45.8  MCV 97.2  PLT 325   Basic Metabolic Panel: Recent Labs  Lab 07/09/21 2130  NA 133*  K 5.3*  CL 103  CO2 21*  GLUCOSE 370*  BUN 31*  CREATININE 1.93*  CALCIUM 9.1  MG 2.4   GFR: Estimated Creatinine Clearance: 33.5 mL/min (A) (by C-G formula based on SCr of 1.93 mg/dL (H)). Liver Function Tests: Recent Labs  Lab 07/09/21 2130  AST 37  ALT 17  ALKPHOS 114  BILITOT 1.2  PROT 8.0  ALBUMIN 4.2   No results for input(s): LIPASE, AMYLASE in the last 168 hours. No results for input(s): AMMONIA in the last 168 hours. Coagulation Profile: Recent Labs  Lab 07/09/21 2130  INR 1.0   Cardiac Enzymes: No results for input(s): CKTOTAL, CKMB, CKMBINDEX, TROPONINI in the last 168 hours. BNP (last 3 results) No results for input(s): PROBNP in the last 8760 hours. HbA1C: No results for input(s): HGBA1C in the last 72 hours. CBG: No  results for input(s): GLUCAP in the last 168 hours. Lipid Profile: No results for input(s): CHOL, HDL, LDLCALC, TRIG, CHOLHDL, LDLDIRECT in the last 72 hours. Thyroid Function Tests: No results for input(s): TSH, T4TOTAL, FREET4, T3FREE, THYROIDAB in the last 72 hours. Anemia Panel: No results for input(s): VITAMINB12, FOLATE, FERRITIN, TIBC, IRON, RETICCTPCT in the last 72 hours. Urine analysis: No results found for: COLORURINE, APPEARANCEUR, LABSPEC, PHURINE, GLUCOSEU, HGBUR, BILIRUBINUR, KETONESUR, PROTEINUR, UROBILINOGEN, NITRITE, LEUKOCYTESUR  Radiological Exams on Admission: DG Chest Portable 1 View  Result Date: 07/09/2021 CLINICAL DATA:  Shortness of breath and chest pain. EXAM: PORTABLE  CHEST 1 VIEW COMPARISON:  03/13/2018 FINDINGS: Post median sternotomy. Mild cardiomegaly. Mild interstitial and septal thickening typical of pulmonary edema. There may be trace pleural effusions. No pneumothorax. Patchy bibasilar opacities favor atelectasis. IMPRESSION: Cardiomegaly with mild pulmonary edema. Possible trace pleural effusions. Findings most consistent with CHF. Electronically Signed   By: Keith Rake M.D.   On: 07/09/2021 21:50    Assessment/Plan    NSTEMI    History of CABG: - Continue heparin infusion - Continue aspirin, metoprolol, atorvastatin - Nitroglycerin sublingual as needed chest pain with morphine for breakthrough - Dr. Fletcher Anon was contacted from the ED and will cath patient in the a.m., sooner if pain recurs    Acute on chronic CHF (congestive heart failure) (HCC) - Continue IV Lasix - Continue metoprolol.  Will hold lisinopril due to kidney function - Daily weights with intake and output monitoring - Echocardiogram in the a.m.    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 This is judged to be reasonable and necessary in order to provide the required intensity of service to ensure the patient's safety given the presenting symptoms, physical exam findings, and initial radiographic and laboratory data in the context of their  Comorbid conditions.   Patient requires inpatient status due to high intensity of service, high risk for further deterioration and high frequency of surveillance required.   I certify that at the point of admission it is my clinical judgment that the patient will require inpatient hospital care spanning beyond [place] MD [facility]   07/10/2021, 12:08 AM

## 2021-07-10 NOTE — Progress Notes (Addendum)
PROGRESS NOTE    Bradley [name]  BZJ:[id number] DOB: 1940-11-26 DOA: 07/09/2021 PCP: Aldona Bar, MD  [id number]   Assessment & Plan:   Principal Problem:   NSTEMI (non-ST elevated myocardial infarction) [facility]) Active Problems:   Acute on chronic diastolic CHF (congestive heart failure) (HCC)   Hyperglycemia due to type 2 diabetes mellitus (HCC)   S/P CABG (coronary artery bypass graft)   HTN (hypertension)   Bradley [name] is a 80 y.o. male with medical history significant for CAD s/p CABG 2016, HTN, DM, CKD 3 and diastolic CHF, followed at the [place] who presents to the ED with sudden onset chest pain and shortness of breath following a meal.     NSTEMI    History of CABG: - trop >24,000.  Started on heparin gtt.  Received left heart cath today Plan: --Management per cardiology  Medical therapy.  Would only consider PCI to distal anastomosis of SVG-RPDA for refractory symptoms. Load with clopidogrel 300 mg daily followed by 75 mg daily thereafter for 12 months. Aggressive secondary prevention and initiation of goal-directed medical therapy for acute systolic heart failure. --cont ASA and statin    Acute on chronic combined CHF exacerbation  - Echo found LVEF 30-35%, with grade II diastolic dysfunction.  LV showed regional wall motion abnormalities, likely due to NSTEMI. --CXR showed Cardiomegaly with mild pulmonary edema.  Started on IV lasix on presentation.  Today, pt had no respiratory symptom and sating well on room air.   Plan: --transition to oral lasix 40 mg daily starting tomorrow, per cardio --cont coreg     Hyperglycemia due to type 2 diabetes mellitus (HCC) -- A1c 8.6.  does not appear to take insulin at home. 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   DVT prophylaxis:  Lovenox SQ Code Status: Full code  Family Communication: niece updated at bedside today  Level of care: Telemetry Cardiac Dispo:   The patient is from: home Anticipated d/c is to: home Anticipated d/c date is: 1-2 days Patient currently is not medically ready to d/c due to: just post-heart cath for NSTEMI, cardiology needs to clear for discharge.   Subjective and Interval History:  Pt was seen post-cath.  Reported chest pain resolved.  No dyspnea.   Objective: Vitals:   07/10/21 1230 07/10/21 1300 07/10/21 1330 07/10/21 1522  BP: (!) 149/74 127/62 120/70 118/73  Pulse: 85 80 89 92  Resp: 16 12 15 18   Temp:    98.4 F (36.9 C)  TempSrc:    Oral  SpO2: 96% 96% 96% 95%  Weight:      Height:        Intake/Output Summary (Last 24 hours) at 07/10/2021 1647 Last data filed at 07/10/2021 1522 Gross per 24 hour  Intake 740 ml  Output 2050 ml  Net -1310 ml   Filed Weights   07/09/21 2124  Weight: 91.6 kg    Examination:   Constitutional: NAD, AAOx3 HEENT: conjunctivae and lids normal, EOMI CV: No cyanosis.   RESP: normal respiratory effort, on RA Extremities: No effusions, edema in BLE SKIN: warm, dry Neuro: II - XII grossly intact.   Psych: Normal mood and affect.  Appropriate judgement and reason   Data Reviewed: I have personally reviewed following labs and imaging studies  CBC: Recent Labs  Lab 07/09/21 2130 07/10/21 0720  WBC 20.5* 14.3*  NEUTROABS 16.7*  --   HGB 15.0 15.9  HCT 45.8 47.8  MCV 97.2 92.8  PLT 325 [id number]   Basic Metabolic Panel: Recent Labs  Lab 07/09/21 2130 07/10/21 0720  NA 133* 138  K 5.3* 3.9  CL 103 103  CO2 21* 24  GLUCOSE 370* 118*  BUN 31* 25*  CREATININE 1.93* 1.43*  CALCIUM 9.1 9.4  MG 2.4  --    GFR: Estimated Creatinine Clearance: 45.2 mL/min (A) (by C-G formula based on SCr of 1.43 mg/dL (H)). Liver Function Tests: Recent Labs  Lab 07/09/21 2130  AST 37  ALT 17  ALKPHOS 114  BILITOT 1.2  PROT 8.0   ALBUMIN 4.2   No results for input(s): LIPASE, AMYLASE in the last 168 hours. No results for input(s): AMMONIA in the last 168 hours. Coagulation Profile: Recent Labs  Lab 07/09/21 2130  INR 1.0   Cardiac Enzymes: No results for input(s): CKTOTAL, CKMB, CKMBINDEX, TROPONINI in the last 168 hours. BNP (last 3 results) No results for input(s): PROBNP in the last 8760 hours. HbA1C: Recent Labs    07/10/21 0720  HGBA1C 8.6*   CBG: Recent Labs  Lab 07/10/21 0447 07/10/21 0814 07/10/21 0956 07/10/21 1346 07/10/21 1559  GLUCAP 129* 131* 125* 281* 236*   Lipid Profile: Recent Labs    07/10/21 0720  CHOL 129  HDL 51  LDLCALC 52  TRIG 128  CHOLHDL 2.5   Thyroid Function Tests: No results for input(s): TSH, T4TOTAL, FREET4, T3FREE, THYROIDAB in the last 72 hours. Anemia Panel: No results for input(s): VITAMINB12, FOLATE, FERRITIN, TIBC, IRON, RETICCTPCT in the last 72 hours. Sepsis Labs: No results for input(s): PROCALCITON, LATICACIDVEN in the last 168 hours.  Recent Results (from the past 240 hour(s))  Resp Panel by RT-PCR (Flu A&B, Covid) Nasopharyngeal Swab     Status: None   Collection Time: 07/09/21  9:45 PM   Specimen: Nasopharyngeal Swab; Nasopharyngeal(NP) swabs in vial transport medium  Result Value Ref Range Status   SARS Coronavirus 2 by RT PCR NEGATIVE NEGATIVE Final    Comment: (NOTE) SARS-CoV-2 target nucleic acids are NOT DETECTED.  The SARS-CoV-2 RNA is generally detectable in upper respiratory specimens during the acute phase of infection. The lowest concentration of SARS-CoV-2 viral copies this assay can detect is 138 copies/mL. A negative result does not preclude SARS-Cov-2 infection and should not be used as the sole basis for treatment or other patient management decisions. A negative result may occur with  improper specimen collection/handling, submission of specimen other than nasopharyngeal swab, presence of viral mutation(s) within  the areas targeted by this assay, and inadequate number of viral copies(<138 copies/mL). A negative result must be combined with clinical observations, patient history, and epidemiological information. The expected result is Negative.  Fact Sheet for Patients:  EntrepreneurPulse.com.au  Fact Sheet for Healthcare Providers:  IncredibleEmployment.be  This test is no t yet approved or cleared by the [place] FDA and  has been authorized for detection and/or diagnosis of SARS-CoV-2 by FDA under an Emergency Use Authorization (EUA). This EUA will remain  in effect (meaning this test can be used) for the duration of the COVID-19 declaration under Section 564(b)(1) of the Act, 21 U.S.C.section 360bbb-3(b)(1), unless the authorization is terminated  or revoked sooner.       Influenza A by PCR NEGATIVE NEGATIVE Final   Influenza B by PCR NEGATIVE NEGATIVE Final    Comment: (NOTE)  The Xpert Xpress SARS-CoV-2/FLU/RSV plus assay is intended as an aid in the diagnosis of influenza from Nasopharyngeal swab specimens and should not be used as a sole basis for treatment. Nasal washings and aspirates are unacceptable for Xpert Xpress SARS-CoV-2/FLU/RSV testing.  Fact Sheet for Patients: EntrepreneurPulse.com.au  Fact Sheet for Healthcare Providers: IncredibleEmployment.be  This test is not yet approved or cleared by the [place] FDA and has been authorized for detection and/or diagnosis of SARS-CoV-2 by FDA under an Emergency Use Authorization (EUA). This EUA will remain in effect (meaning this test can be used) for the duration of the COVID-19 declaration under Section 564(b)(1) of the Act, 21 U.S.C. section 360bbb-3(b)(1), unless the authorization is terminated or revoked.  Performed at [facility], [address] [place], [place] [address]       Radiology Studies: DG Chest Portable 1  View  Result Date: 07/09/2021 CLINICAL DATA:  Shortness of breath and chest pain. EXAM: PORTABLE CHEST 1 VIEW COMPARISON:  03/13/2018 FINDINGS: Post median sternotomy. Mild cardiomegaly. Mild interstitial and septal thickening typical of pulmonary edema. There may be trace pleural effusions. No pneumothorax. Patchy bibasilar opacities favor atelectasis. IMPRESSION: Cardiomegaly with mild pulmonary edema. Possible trace pleural effusions. Findings most consistent with CHF. Electronically Signed   By: Keith Rake M.D.   On: 07/09/2021 21:50   ECHOCARDIOGRAM COMPLETE  Result Date: 07/10/2021    ECHOCARDIOGRAM REPORT   Patient Name:   LAITHEN ADIE Date of Exam: 07/10/2021 Medical Rec #:  [id number]    Height:       72.0 in Accession #:    [id number]   Weight:       201.9 lb Date of Birth:  07-Nov-1940    BSA:          2.139 m Patient Age:    9 years     BP:           147/70 mmHg Patient Gender: M            HR:           88 bpm. Exam Location:  [facility] Procedure: 2D Echo, Color Doppler, Cardiac Doppler and Intracardiac            Opacification Agent Indications:     I50.31 congestive heart failure-Acute Diastolic  History:         Patient has prior history of Echocardiogram examinations. Prior                  CABG; Risk Factors:Hypertension, Diabetes and Dyslipidemia.  Sonographer:     Charmayne Sheer Referring Phys:  [id number] Athena Masse Diagnosing Phys: Nelva Bush MD  Sonographer Comments: Suboptimal apical window and suboptimal subcostal window. IMPRESSIONS  1. Left ventricular ejection fraction, by estimation, is 30 to 35%. The left ventricle has moderately decreased function. The left ventricle demonstrates regional wall motion abnormalities (see scoring diagram/findings for description). There is mild left ventricular hypertrophy. Left ventricular diastolic parameters are consistent with Grade II diastolic dysfunction (pseudonormalization). Elevated left atrial pressure. There is severe hypokinesis of  the left ventricular, entire anteroseptal wall, anterior wall and anterolateral wall.  2. Right ventricular systolic function is normal. The right ventricular size is normal.  3. The mitral valve is degenerative. Moderate mitral valve regurgitation. No evidence of mitral stenosis.  4. The aortic valve has an indeterminant number of cusps. There is moderate calcification of the aortic valve. There is severe thickening of the aortic valve. Aortic valve regurgitation is mild to  moderate. Mild to moderate aortic valve stenosis.  5. There is mild dilatation of the ascending aorta, measuring 38 mm.  6. The inferior vena cava is normal in size with <50% respiratory variability, suggesting right atrial pressure of 8 mmHg. FINDINGS  Left Ventricle: Left ventricular ejection fraction, by estimation, is 30 to 35%. The left ventricle has moderately decreased function. The left ventricle demonstrates regional wall motion abnormalities. Severe hypokinesis of the left ventricular, entire  anteroseptal wall, anterior wall and anterolateral wall. Definity contrast agent was given IV to delineate the left ventricular endocardial borders. The left ventricular internal cavity size was normal in size. There is mild left ventricular hypertrophy. Left ventricular diastolic parameters are consistent with Grade II diastolic dysfunction (pseudonormalization). Elevated left atrial pressure. Right Ventricle: The right ventricular size is normal. No increase in right ventricular wall thickness. Right ventricular systolic function is normal. Left Atrium: Left atrial size was normal in size. Right Atrium: Right atrial size was normal in size. Pericardium: There is no evidence of pericardial effusion. Mitral Valve: The mitral valve is degenerative in appearance. There is mild thickening of the mitral valve leaflet(s). Moderate mitral valve regurgitation. No evidence of mitral valve stenosis. MV peak gradient, 6.0 mmHg. The mean mitral valve  gradient is 3.0 mmHg. Tricuspid Valve: The tricuspid valve is grossly normal. Tricuspid valve regurgitation is trivial. Aortic Valve: The aortic valve has an indeterminant number of cusps. There is moderate calcification of the aortic valve. There is severe thickening of the aortic valve. Aortic valve regurgitation is mild to moderate. Aortic regurgitation PHT measures 498 msec. Mild to moderate aortic stenosis is present. Aortic valve mean gradient measures 8.0 mmHg. Aortic valve peak gradient measures 15.1 mmHg. Aortic valve area, by VTI measures 1.85 cm. Pulmonic Valve: The pulmonic valve was not well visualized. Pulmonic valve regurgitation is not visualized. No evidence of pulmonic stenosis. Aorta: The aortic root is normal in size and structure. There is mild dilatation of the ascending aorta, measuring 38 mm. Pulmonary Artery: The pulmonary artery is not well seen. Venous: The inferior vena cava is normal in size with less than 50% respiratory variability, suggesting right atrial pressure of 8 mmHg. IAS/Shunts: No atrial level shunt detected by color flow Doppler.  LEFT VENTRICLE PLAX 2D LVIDd:         4.00 cm   Diastology LVIDs:         3.40 cm   LV e' medial:    5.00 cm/s LV PW:         1.31 cm   LV E/e' medial:  23.6 LV IVS:        1.09 cm   LV e' lateral:   6.64 cm/s LVOT diam:     2.20 cm   LV E/e' lateral: 17.8 LV SV:         68 LV SV Index:   32 LVOT Area:     3.80 cm  RIGHT VENTRICLE RV Basal diam:  3.10 cm LEFT ATRIUM             Index        RIGHT ATRIUM           Index LA diam:        4.20 cm 1.96 cm/m   RA Area:     12.30 cm LA Vol (A2C):   57.3 ml 26.78 ml/m  RA Volume:   27.10 ml  12.67 ml/m LA Vol (A4C):   48.3 ml 22.58 ml/m LA Biplane Vol: 52.8  ml 24.68 ml/m  AORTIC VALVE AV Area (Vmax):    1.68 cm AV Area (Vmean):   1.77 cm AV Area (VTI):     1.85 cm AV Vmax:           194.00 cm/s AV Vmean:          131.000 cm/s AV VTI:            0.367 m AV Peak Grad:      15.1 mmHg AV Mean Grad:       8.0 mmHg LVOT Vmax:         85.50 cm/s LVOT Vmean:        61.000 cm/s LVOT VTI:          0.179 m LVOT/AV VTI ratio: 0.49 AI PHT:            498 msec  AORTA Ao Root diam: 3.30 cm MITRAL VALVE MV Area (PHT): 4.96 cm     SHUNTS MV Area VTI:   2.63 cm     Systemic VTI:  0.18 m MV Peak grad:  6.0 mmHg     Systemic Diam: 2.20 cm MV Mean grad:  3.0 mmHg MV Vmax:       1.22 m/s MV Vmean:      72.9 cm/s MV Decel Time: 153 msec MV E velocity: 118.00 cm/s MV A velocity: 67.30 cm/s MV E/A ratio:  1.75 Harrell Gave End MD Electronically signed by Nelva Bush MD Signature Date/Time: 07/10/2021/9:19:21 AM    Final      Scheduled Meds:  aspirin       [START ON 07/11/2021] aspirin EC  81 mg Oral Daily   atorvastatin  40 mg Oral Daily   carvedilol  3.125 mg Oral BID WC   [START ON 07/11/2021] clopidogrel  75 mg Oral Q breakfast   [START ON 07/11/2021] enoxaparin (LOVENOX) injection  40 mg Subcutaneous Q24H   [START ON 07/11/2021] furosemide  40 mg Oral Daily   insulin aspart  0-15 Units Subcutaneous Q4H   isosorbide mononitrate  15 mg Oral Daily   sodium chloride flush  3 mL Intravenous Q12H   Continuous Infusions:  sodium chloride       LOS: 0 days   No charge note.  Enzo Bi, MD [facility] If 7PM-7AM, please contact night-coverage 07/10/2021, 4:47 PM

## 2021-07-10 NOTE — Progress Notes (Signed)
ANTICOAGULATION CONSULT NOTE  Pharmacy Consult for heparin infusion Indication: ACS/STEMI  Allergies  Allergen Reactions   Simvastatin     Patient Measurements: Height: 6' (182.9 cm) Weight: 91.6 kg (201 lb 15.1 oz) IBW/kg (Calculated) : 77.6 Heparin Dosing Weight: 91.6 kg  Vital Signs: Temp: 97.9 F (36.6 C) (11/14 2123) Temp Source: Oral (11/14 2123) BP: 147/70 (11/15 0800) Pulse Rate: 80 (11/15 0600)  Labs: Recent Labs    07/09/21 2130 07/09/21 2331 07/10/21 0720  HGB 15.0  --  15.9  HCT 45.8  --  47.8  PLT 325  --  314  APTT 27  --   --   LABPROT 12.7  --   --   INR 1.0  --   --   HEPARINUNFRC  --   --  0.61  CREATININE 1.93*  --  1.43*  TROPONINIHS 1,355* 14,103* >24,000*     Estimated Creatinine Clearance: 45.2 mL/min (A) (by C-G formula based on SCr of 1.43 mg/dL (H)).   Medical History: Past Medical History:  Diagnosis Date   (HFpEF) heart failure with preserved ejection fraction (HCC)    Coronary artery disease    Diabetes mellitus without complication (HCC)    Hyperlipidemia    Hypertension     Assessment: Pt is 80 yo male presenting to ED found with elevated Troponin I level.  11/15 0720 HL 0.61   Goal of Therapy:  Heparin level 0.3-0.7 units/ml Monitor platelets by anticoagulation protocol: Yes   Plan:  Heparin level is therapeutic. Will continue heparin infusion at 1200 units/hr. Will recheck heparin level in 8 hours. CBC daily while on heparin.    Paschal Dopp, PharmD 07/10/2021 9:00 AM

## 2021-07-10 NOTE — Consult Note (Signed)
Cardiology Consultation:   Patient ID: Bradley [name] MRN: [id number]; DOB: 05-15-1941  Admit date: 07/09/2021 Date of Consult: 07/10/2021  PCP:  Bradley Schneiders, MD   [place] Prov[facility] Cardiologist:  [facility]   Patient Profile:   Bradley [name] is a 80 y.o. male with a hx of coronary artery disease status post CABG in the setting of NSTEMI in 2016 at [facility] (LIMA-LAD, SVG-OM1, and SVG-RPDA), valvular heart disease (details uncertain), chronic diastolic heart failure, hypertension, hyperlipidemia, type 2 diabetes mellitus, and chronic kidney disease stage III who is being seen 07/10/2021 for the evaluation of NSTEMI at the request of Dr. Damita Dunnings.  History of Present Illness:   Bradley [name] was in his usual state of health until around 5 PM yesterday.  He had just eaten at [employer] and had sudden onset of substernal chest pressure and shortness of breath while driving home.  He took sublingual nitroglycerin x3 without any improvement and summoned EMS.  He noted him to be hypoxic, prompting him to be placed on supplemental oxygen.  He continued to have 5/10 substernal chest pain as well as some nausea and shortness of breath when he arrived at the ED, though this subsided over the next few hours with nitroglycerin paste.  He currently is chest pain-free and feels like his breathing is back to baseline.  He notes over the last few months, that he has had episodic chest tightness that was very brief and not related to any specific activities.  He also reports having undergone an echocardiogram through the [place] in [place] few months ago and was told that one of his heart valves was calcified and did not close completely.  He was advised that he would need a repeat echocardiogram in January.   Past Medical History:  Diagnosis Date   (HFpEF) heart failure with preserved ejection fraction ([place])    Coronary artery disease    Diabetes mellitus without complication ([place])     Hyperlipidemia    Hypertension     Past Surgical History:  Procedure Laterality Date   CHOLECYSTECTOMY     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   [facility]: LIMA-LAD, SVG-OM1, and SVG-rPDA     Home Medications:  Prior to Admission medications   Medication Sig Start Date Evonne Rinks Date Taking? Authorizing Provider  acetaminophen (TYLENOL) 325 MG tablet Take 650 mg by mouth 2 (two) times daily as needed.   Yes [provider]  aspirin EC 81 MG tablet Take 1 tablet by mouth daily.   Yes [provider]  atorvastatin (LIPITOR) 80 MG tablet Take 40 mg by mouth daily.   Yes [provider]  Brinzolamide-Brimonidine 1-0.2 % SUSP Place 1 drop into the left eye 3 (three) times daily.   Yes [provider]  clotrimazole (LOTRIMIN) 1 % cream Apply 1 application topically 2 (two) times daily.   Yes [provider]  empagliflozin (JARDIANCE) 25 MG TABS tablet Take 12.5 mg by mouth every morning.   Yes [provider]  hydrochlorothiazide (HYDRODIURIL) 25 MG tablet Take 25 mg by mouth at bedtime.    Yes [provider]  hydrocortisone cream 1 % Apply 1 application topically 2 (two) times daily.   Yes [provider]  isosorbide mononitrate (IMDUR) 120 MG 24 hr tablet Take 120 mg by mouth daily.   Yes [provider]  pantoprazole (PROTONIX) 40 MG tablet Take 40 mg by mouth daily.   Yes [provider]  Propylene Glycol 0.6 % SOLN  Place 1 drop into both eyes 4 (four) times daily.   Yes [provider]  timolol (TIMOPTIC-XR) 0.5 % ophthalmic gel-forming Place 1 drop into both eyes daily. 10/29/20  Yes [provider]  travoprost, benzalkonium, (TRAVATAN) 0.004 % ophthalmic solution Place 1 drop into both eyes nightly.   Yes [provider]  vitamin B-12 (CYANOCOBALAMIN) 500 MCG tablet Take 1,000 mcg by mouth daily. Before breakfast   Yes [provider]  furosemide (LASIX) 20 MG tablet  Take 20 mg by mouth daily.  10/09/14 03/13/18  [provider]  metoprolol tartrate (LOPRESSOR) 25 MG tablet Take by mouth. 10/09/14 10/09/15  [provider]    Inpatient Medications: Scheduled Meds:  [START ON 07/11/2021] aspirin  81 mg Oral Pre-Cath   [START ON 07/11/2021] aspirin EC  81 mg Oral Daily   atorvastatin  40 mg Oral Daily   carvedilol  3.125 mg Oral BID WC   furosemide  40 mg Intravenous BID   insulin aspart  0-15 Units Subcutaneous Q4H   sodium chloride flush  3 mL Intravenous Q12H   Continuous Infusions:  sodium chloride     [START ON 07/11/2021] sodium chloride     heparin 1,200 Units/hr (07/09/21 2332)   PRN Meds: sodium chloride, acetaminophen, nitroGLYCERIN, ondansetron (ZOFRAN) IV, sodium chloride flush  Allergies:    Allergies  Allergen Reactions   Simvastatin     Social History:   Social History   Tobacco Use   Smoking status: Former   Smokeless tobacco: Never  Substance Use Topics   Alcohol use: Never   Drug use: Never     Family History:   Family History  Problem Relation Age of Onset   Cancer Sister    Cancer Brother      ROS:  Please see the history of present illness. All other ROS reviewed and negative.     Physical Exam/Data:   Vitals:   07/10/21 0500 07/10/21 0600 07/10/21 0700 07/10/21 0800  BP: 139/71 (!) 151/71 (!) 148/89 (!) 147/70  Pulse: 80 80    Resp: 13 12 14 12   Temp:      TempSrc:      SpO2: 98% 98%    Weight:      Height:        Intake/Output Summary (Last 24 hours) at 07/10/2021 0908 Last data filed at 07/10/2021 0829 Gross per 24 hour  Intake --  Output 1300 ml  Net -1300 ml   Last 3 Weights 07/09/2021 03/14/2018 03/13/2018  Weight (lbs) 201 lb 15.1 oz 208 lb 12.8 oz 215 lb  Weight (kg) 91.6 kg 94.711 kg 97.523 kg     Body mass index is 27.39 kg/m.  General:  Well nourished, well developed, in no acute distress HEENT: normal Neck: no JVD Vascular: No carotid bruits; 2+ radial pulses  bilaterally. Cardiac:  normal S1, S2; RRR; 1/6 systolic murmur Lungs: Mildly diminished breath sounds at the lung bases without wheezes or crackles. Abd: soft, nontender, no hepatomegaly  Ext: no edema Musculoskeletal:  No deformities, BUE and BLE strength normal and equal Skin: warm and dry  Neuro:  CNs 2-12 intact, no focal abnormalities noted Psych:  Normal affect   EKG:  The EKG was personally reviewed and demonstrates: Normal sinus rhythm with diffuse ST depressions as well as ST elevation in V1 and aVR.  Right axis deviation also present. Telemetry:  Telemetry was personally reviewed and demonstrates: Sinus rhythm with PVCs.  Relevant CV Studies: TTE (03/14/2018): Normal  LV size with mild LVH.  LVEF 60-65% with normal wall motion and grade 1 diastolic dysfunction.  Normal RV size and function.  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  Mild-moderate mitral regurgitation.  Mild left atrial enlargement.  Laboratory Data:  High Sensitivity Troponin:   Recent Labs  Lab 07/09/21 2130 07/09/21 2331 07/10/21 0720  TROPONINIHS 1,355* 14,103* >24,000*     Chemistry Recent Labs  Lab 07/09/21 2130 07/10/21 0720  NA 133* 138  K 5.3* 3.9  CL 103 103  CO2 21* 24  GLUCOSE 370* 118*  BUN 31* 25*  CREATININE 1.93* 1.43*  CALCIUM 9.1 9.4  MG 2.4  --   GFRNONAA 35* 50*  ANIONGAP 9 11    Recent Labs  Lab 07/09/21 2130  PROT 8.0  ALBUMIN 4.2  AST 37  ALT 17  ALKPHOS 114  BILITOT 1.2   Lipids  Recent Labs  Lab 07/10/21 0720  CHOL 129  TRIG 128  HDL 51  LDLCALC 52  CHOLHDL 2.5    Hematology Recent Labs  Lab 07/09/21 2130 07/10/21 0720  WBC 20.5* 14.3*  RBC 4.71 5.15  HGB 15.0 15.9  HCT 45.8 47.8  MCV 97.2 92.8  MCH 31.8 30.9  MCHC 32.8 33.3  RDW 13.9 14.0  PLT 325 314   Thyroid No results for input(s): TSH, FREET4 in the last 168 hours.  BNP Recent Labs  Lab 07/09/21 2130  BNP 231.9*    DDimer No results for input(s): DDIMER in  the last 168 hours.   Radiology/Studies:  DG Chest Portable 1 View  Result Date: 07/09/2021 CLINICAL DATA:  Shortness of breath and chest pain. EXAM: PORTABLE CHEST 1 VIEW COMPARISON:  03/13/2018 FINDINGS: Post median sternotomy. Mild cardiomegaly. Mild interstitial and septal thickening typical of pulmonary edema. There may be trace pleural effusions. No pneumothorax. Patchy bibasilar opacities favor atelectasis. IMPRESSION: Cardiomegaly with mild pulmonary edema. Possible trace pleural effusions. Findings most consistent with CHF. Electronically Signed   By: Narda Rutherford M.D.   On: 07/09/2021 21:50     Assessment and Plan:   NSTEMI: Smell and presents with cute onset of chest pain and shortness of breath concerning for angina.  EKG shows findings consistent with global ischemia with markedly elevated troponin (most recently greater than 24,000).  He is currently chest pain-free with nitroglycerin paste in place and on IV heparin.  Initial creatinine was moderately elevated on arrival, though this has improved on recheck this morning. -Plan for cardiac catheterization with possible PCI this morning. -Continue IV heparin and aspirin pending catheterization.  Acute on chronic HFpEF and valvular heart disease: Eveland appears euvolemic but had cute onset of shortness of breath associated with this chest pain.  Question if this could be flash pulmonary edema from worsening diastolic dysfunction in the setting of NSTEMI.  Repeat echo is pending this morning. -Maintain net even fluid balance in the setting of acute kidney injury superimposed on chronic kidney disease. -Follow-up echocardiogram. -Further recommendations regarding volume management to be made after catheterization; will evaluate LVEDP at the time of catheterization.  Hypertension: Blood pressure labile, currently mildly elevated. -Continue carvedilol.  Hyperlipidemia: -Continue atorvastatin 40 mg daily.  Type 2 diabetes  mellitus: -Per internal medicine.  Acute kidney injury superimposed on chronic kidney disease: -Maintain net even fluid balance. -Avoid nephrotoxic agents.   Risk Assessment/Risk Scores:     TIMI Risk Score for Unstable Angina or Non-ST Elevation MI:   The patient's TIMI risk score is 6,  which indicates a 41% risk of all cause mortality, new or recurrent myocardial infarction or need for urgent revascularization in the next 14 days.  New York Heart Association (NYHA) Functional Class NYHA Class IV   For questions or updates, please contact [facility] Please consult [email or web address] for contact info under [facility].  Signed, Nelva Bush, MD  07/10/2021 9:08 AM

## 2021-07-10 NOTE — H&P (View-Only) (Signed)
Cardiology Consultation:   Patient ID: Bradley [name] MRN: [id number]; DOB: 10-04-1940  Admit date: 07/09/2021 Date of Consult: 07/10/2021  PCP:  Bradley Schneiders, MD   [place] Providers Cardiologist:  Bradley [name]   Patient Profile:   Bradley [name] is a 80 y.o. male with a hx of coronary artery disease status post CABG in the setting of NSTEMI in 2016 at [name] (LIMA-LAD, SVG-OM1, and SVG-RPDA), valvular heart disease (details uncertain), chronic diastolic heart failure, hypertension, hyperlipidemia, type 2 diabetes mellitus, and chronic kidney disease stage III who is being seen 07/10/2021 for the evaluation of NSTEMI at the request of Dr. Damita Dunnings.  History of Present Illness:   Mr. Breer was in his usual state of health until around 5 PM yesterday.  He had just eaten at [employer] and had sudden onset of substernal chest pressure and shortness of breath while driving home.  He took sublingual nitroglycerin x3 without any improvement and summoned EMS.  He noted him to be hypoxic, prompting him to be placed on supplemental oxygen.  He continued to have 5/10 substernal chest pain as well as some nausea and shortness of breath when he arrived at the ED, though this subsided over the next few hours with nitroglycerin paste.  He currently is chest pain-free and feels like his breathing is back to baseline.  He notes over the last few months, that he has had episodic chest tightness that was very brief and not related to any specific activities.  He also reports having undergone an echocardiogram through the [place] in [place] few months ago and was told that one of his heart valves was calcified and did not close completely.  He was advised that he would need a repeat echocardiogram in January.   Past Medical History:  Diagnosis Date   (HFpEF) heart failure with preserved ejection fraction ([place])    Coronary artery disease    Diabetes mellitus without complication ([place])     Hyperlipidemia    Hypertension     Past Surgical History:  Procedure Laterality Date   CHOLECYSTECTOMY     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   [facility]: LIMA-LAD, SVG-OM1, and SVG-rPDA     Home Medications:  Prior to Admission medications   Medication Sig Start Date Alexey Rhoads Date Taking? Authorizing Provider  acetaminophen (TYLENOL) 325 MG tablet Take 650 mg by mouth 2 (two) times daily as needed.   Yes [provider]  aspirin EC 81 MG tablet Take 1 tablet by mouth daily.   Yes [provider]  atorvastatin (LIPITOR) 80 MG tablet Take 40 mg by mouth daily.   Yes [provider]  Brinzolamide-Brimonidine 1-0.2 % SUSP Place 1 drop into the left eye 3 (three) times daily.   Yes [provider]  clotrimazole (LOTRIMIN) 1 % cream Apply 1 application topically 2 (two) times daily.   Yes [provider]  empagliflozin (JARDIANCE) 25 MG TABS tablet Take 12.5 mg by mouth every morning.   Yes [provider]  hydrochlorothiazide (HYDRODIURIL) 25 MG tablet Take 25 mg by mouth at bedtime.    Yes [provider]  hydrocortisone cream 1 % Apply 1 application topically 2 (two) times daily.   Yes [provider]  isosorbide mononitrate (IMDUR) 120 MG 24 hr tablet Take 120 mg by mouth daily.   Yes [provider]  pantoprazole (PROTONIX) 40 MG tablet Take 40 mg by mouth daily.   Yes [provider]  Propylene Glycol 0.6 % SOLN  Place 1 drop into both eyes 4 (four) times daily.   Yes [provider]  timolol (TIMOPTIC-XR) 0.5 % ophthalmic gel-forming Place 1 drop into both eyes daily. 10/29/20  Yes [provider]  travoprost, benzalkonium, (TRAVATAN) 0.004 % ophthalmic solution Place 1 drop into both eyes nightly.   Yes [provider]  vitamin B-12 (CYANOCOBALAMIN) 500 MCG tablet Take 1,000 mcg by mouth daily. Before breakfast   Yes [provider]  furosemide (LASIX) 20 MG tablet  Take 20 mg by mouth daily.  10/09/14 03/13/18  [provider]  metoprolol tartrate (LOPRESSOR) 25 MG tablet Take by mouth. 10/09/14 10/09/15  [provider]    Inpatient Medications: Scheduled Meds:  [START ON 07/11/2021] aspirin  81 mg Oral Pre-Cath   [START ON 07/11/2021] aspirin EC  81 mg Oral Daily   atorvastatin  40 mg Oral Daily   carvedilol  3.125 mg Oral BID WC   furosemide  40 mg Intravenous BID   insulin aspart  0-15 Units Subcutaneous Q4H   sodium chloride flush  3 mL Intravenous Q12H   Continuous Infusions:  sodium chloride     [START ON 07/11/2021] sodium chloride     heparin 1,200 Units/hr (07/09/21 2332)   PRN Meds: sodium chloride, acetaminophen, nitroGLYCERIN, ondansetron (ZOFRAN) IV, sodium chloride flush  Allergies:    Allergies  Allergen Reactions   Simvastatin     Social History:   Social History   Tobacco Use   Smoking status: Former   Smokeless tobacco: Never  Substance Use Topics   Alcohol use: Never   Drug use: Never     Family History:   Family History  Problem Relation Age of Onset   Cancer Sister    Cancer Brother      ROS:  Please see the history of present illness. All other ROS reviewed and negative.     Physical Exam/Data:   Vitals:   07/10/21 0500 07/10/21 0600 07/10/21 0700 07/10/21 0800  BP: 139/71 (!) 151/71 (!) 148/89 (!) 147/70  Pulse: 80 80    Resp: 13 12 14 12   Temp:      TempSrc:      SpO2: 98% 98%    Weight:      Height:        Intake/Output Summary (Last 24 hours) at 07/10/2021 0908 Last data filed at 07/10/2021 0829 Gross per 24 hour  Intake --  Output 1300 ml  Net -1300 ml   Last 3 Weights 07/09/2021 03/14/2018 03/13/2018  Weight (lbs) 201 lb 15.1 oz 208 lb 12.8 oz 215 lb  Weight (kg) 91.6 kg 94.711 kg 97.523 kg     Body mass index is 27.39 kg/m.  General:  Well nourished, well developed, in no acute distress HEENT: normal Neck: no JVD Vascular: No carotid bruits; 2+ radial pulses  bilaterally. Cardiac:  normal S1, S2; RRR; 1/6 systolic murmur Lungs: Mildly diminished breath sounds at the lung bases without wheezes or crackles. Abd: soft, nontender, no hepatomegaly  Ext: no edema Musculoskeletal:  No deformities, BUE and BLE strength normal and equal Skin: warm and dry  Neuro:  CNs 2-12 intact, no focal abnormalities noted Psych:  Normal affect   EKG:  The EKG was personally reviewed and demonstrates: Normal sinus rhythm with diffuse ST depressions as well as ST elevation in V1 and aVR.  Right axis deviation also present. Telemetry:  Telemetry was personally reviewed and demonstrates: Sinus rhythm with PVCs.  Relevant CV Studies: TTE (03/14/2018): Normal  LV size with mild LVH.  LVEF 60-65% with normal wall motion and grade 1 diastolic dysfunction.  Normal RV size and function.  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  Mild-moderate mitral regurgitation.  Mild left atrial enlargement.  Laboratory Data:  High Sensitivity Troponin:   Recent Labs  Lab 07/09/21 2130 07/09/21 2331 07/10/21 0720  TROPONINIHS 1,355* 14,103* >24,000*     Chemistry Recent Labs  Lab 07/09/21 2130 07/10/21 0720  NA 133* 138  K 5.3* 3.9  CL 103 103  CO2 21* 24  GLUCOSE 370* 118*  BUN 31* 25*  CREATININE 1.93* 1.43*  CALCIUM 9.1 9.4  MG 2.4  --   GFRNONAA 35* 50*  ANIONGAP 9 11    Recent Labs  Lab 07/09/21 2130  PROT 8.0  ALBUMIN 4.2  AST 37  ALT 17  ALKPHOS 114  BILITOT 1.2   Lipids  Recent Labs  Lab 07/10/21 0720  CHOL 129  TRIG 128  HDL 51  LDLCALC 52  CHOLHDL 2.5    Hematology Recent Labs  Lab 07/09/21 2130 07/10/21 0720  WBC 20.5* 14.3*  RBC 4.71 5.15  HGB 15.0 15.9  HCT 45.8 47.8  MCV 97.2 92.8  MCH 31.8 30.9  MCHC 32.8 33.3  RDW 13.9 14.0  PLT 325 314   Thyroid No results for input(s): TSH, FREET4 in the last 168 hours.  BNP Recent Labs  Lab 07/09/21 2130  BNP 231.9*    DDimer No results for input(s): DDIMER in  the last 168 hours.   Radiology/Studies:  DG Chest Portable 1 View  Result Date: 07/09/2021 CLINICAL DATA:  Shortness of breath and chest pain. EXAM: PORTABLE CHEST 1 VIEW COMPARISON:  03/13/2018 FINDINGS: Post median sternotomy. Mild cardiomegaly. Mild interstitial and septal thickening typical of pulmonary edema. There may be trace pleural effusions. No pneumothorax. Patchy bibasilar opacities favor atelectasis. IMPRESSION: Cardiomegaly with mild pulmonary edema. Possible trace pleural effusions. Findings most consistent with CHF. Electronically Signed   By: Narda Rutherford M.D.   On: 07/09/2021 21:50     Assessment and Plan:   NSTEMI: Smell and presents with cute onset of chest pain and shortness of breath concerning for angina.  EKG shows findings consistent with global ischemia with markedly elevated troponin (most recently greater than 24,000).  He is currently chest pain-free with nitroglycerin paste in place and on IV heparin.  Initial creatinine was moderately elevated on arrival, though this has improved on recheck this morning. -Plan for cardiac catheterization with possible PCI this morning. -Continue IV heparin and aspirin pending catheterization.  Acute on chronic HFpEF and valvular heart disease: Eveland appears euvolemic but had cute onset of shortness of breath associated with this chest pain.  Question if this could be flash pulmonary edema from worsening diastolic dysfunction in the setting of NSTEMI.  Repeat echo is pending this morning. -Maintain net even fluid balance in the setting of acute kidney injury superimposed on chronic kidney disease. -Follow-up echocardiogram. -Further recommendations regarding volume management to be made after catheterization; will evaluate LVEDP at the time of catheterization.  Hypertension: Blood pressure labile, currently mildly elevated. -Continue carvedilol.  Hyperlipidemia: -Continue atorvastatin 40 mg daily.  Type 2 diabetes  mellitus: -Per internal medicine.  Acute kidney injury superimposed on chronic kidney disease: -Maintain net even fluid balance. -Avoid nephrotoxic agents.   Risk Assessment/Risk Scores:     TIMI Risk Score for Unstable Angina or Non-ST Elevation MI:   The patient's TIMI risk score is 6,  which indicates a 41% risk of all cause mortality, new or recurrent myocardial infarction or need for urgent revascularization in the next 14 days.  New York Heart Association (NYHA) Functional Class NYHA Class IV   For questions or updates, please contact [facility] Please consult [email or web address] for contact info under [name].  Signed, Nelva Bush, MD  07/10/2021 9:08 AM

## 2021-07-10 NOTE — Progress Notes (Signed)
*  PRELIMINARY RESULTS* Echocardiogram 2D Echocardiogram has been performed.  Bradley [name] [name] 07/10/2021, 8:59 AM

## 2021-07-11 ENCOUNTER — Encounter: Payer: Self-pay | Admitting: Internal Medicine

## 2021-07-11 DIAGNOSIS — I214 Non-ST elevation (NSTEMI) myocardial infarction: Secondary | ICD-10-CM | POA: Diagnosis not present

## 2021-07-11 DIAGNOSIS — I255 Ischemic cardiomyopathy: Secondary | ICD-10-CM | POA: Diagnosis not present

## 2021-07-11 LAB — BASIC METABOLIC PANEL
Anion gap: 12 (ref 5–15)
BUN: 26 mg/dL — ABNORMAL HIGH (ref 8–23)
CO2: 23 mmol/L (ref 22–32)
Calcium: 9.2 mg/dL (ref 8.9–10.3)
Chloride: 102 mmol/L (ref 98–111)
Creatinine, Ser: 1.43 mg/dL — ABNORMAL HIGH (ref 0.61–1.24)
GFR, Estimated: 50 mL/min — ABNORMAL LOW (ref >60)
Glucose, Bld: 176 mg/dL — ABNORMAL HIGH (ref 70–99)
Potassium: 3.8 mmol/L (ref 3.5–5.1)
Sodium: 137 mmol/L (ref 135–145)

## 2021-07-11 LAB — CBC
HCT: 45.2 % (ref 39.0–52.0)
Hemoglobin: 15.1 g/dL (ref 13.0–17.0)
MCH: 31.5 pg (ref 26.0–34.0)
MCHC: 33.4 g/dL (ref 30.0–36.0)
MCV: 94.2 fL (ref 80.0–100.0)
Platelets: 243 10*3/uL (ref 150–400)
RBC: 4.8 MIL/uL (ref 4.22–5.81)
RDW: 14.1 % (ref 11.5–15.5)
WBC: 11.7 10*3/uL — ABNORMAL HIGH (ref 4.0–10.5)
nRBC: 0 % (ref 0.0–0.2)

## 2021-07-11 LAB — GLUCOSE, CAPILLARY
Glucose-Capillary: 194 mg/dL — ABNORMAL HIGH (ref 70–99)
Glucose-Capillary: 199 mg/dL — ABNORMAL HIGH (ref 70–99)

## 2021-07-11 MED ORDER — CLOPIDOGREL BISULFATE 75 MG PO TABS
75.0000 mg | ORAL_TABLET | Freq: Every day | ORAL | 2 refills | Status: DC
Start: 2021-07-12 — End: 2022-09-16

## 2021-07-11 MED ORDER — NITROGLYCERIN 0.4 MG SL SUBL: 0.4000 mg | SUBLINGUAL_TABLET | SUBLINGUAL | 2 refills | Status: DC | PRN

## 2021-07-11 MED ORDER — FUROSEMIDE 40 MG PO TABS: 40.0000 mg | ORAL_TABLET | Freq: Every day | ORAL | 2 refills | Status: DC

## 2021-07-11 MED ORDER — ISOSORBIDE MONONITRATE ER 30 MG PO TB24
15.0000 mg | ORAL_TABLET | Freq: Every day | ORAL | 2 refills | Status: DC
Start: 2021-07-12 — End: 2021-08-06

## 2021-07-11 MED ORDER — CARVEDILOL 3.125 MG PO TABS: 3.1250 mg | ORAL_TABLET | Freq: Two times a day (BID) | ORAL | 2 refills | Status: DC

## 2021-07-11 NOTE — Plan of Care (Signed)

## 2021-07-11 NOTE — Progress Notes (Signed)
Progress Note  Patient Name: Bradley [name] Date of Encounter: 07/11/2021  [facility]ologist: [facility]  Subjective   Patient denies chest pain. Reports he is feeling well today. No SOB. Cath site is  stable. Says he has been up and about the room and feels good. [facility] will [facility].   Inpatient Medications    Scheduled Meds:  aspirin EC  81 mg Oral Daily   atorvastatin  40 mg Oral Daily   brinzolamide  1 drop Left Eye TID   And   brimonidine  1 drop Left Eye TID   carvedilol  3.125 mg Oral BID WC   clopidogrel  75 mg Oral Q breakfast   enoxaparin (LOVENOX) injection  40 mg Subcutaneous Q24H   furosemide  40 mg Oral Daily   insulin aspart  0-15 Units Subcutaneous TID WC   insulin aspart  0-5 Units Subcutaneous QHS   isosorbide mononitrate  15 mg Oral Daily   pantoprazole  40 mg Oral Daily   sodium chloride flush  3 mL Intravenous Q12H   timolol  1 drop Both Eyes Daily   Travoprost (BAK Free)  1 drop Both Eyes QHS   Continuous Infusions:  sodium chloride     PRN Meds: sodium chloride, acetaminophen, nitroGLYCERIN, ondansetron (ZOFRAN) IV, sodium chloride flush   Vital Signs    Vitals:   07/10/21 2351 07/11/21 0426 07/11/21 0621 07/11/21 0823  BP: (!) 100/58 114/67  (!) 154/95  Pulse: 97 93  (!) 105  Resp: 16 16  16   Temp: 98.6 F (37 C) 98.4 F (36.9 C)  97.9 F (36.6 C)  TempSrc:      SpO2: 98% 95%  95%  Weight:   81.2 kg   Height:        Intake/Output Summary (Last 24 hours) at 07/11/2021 0944 Last data filed at 07/10/2021 2351 Gross per 24 hour  Intake 980 ml  Output 1200 ml  Net -220 ml   Last 3 Weights 07/11/2021 07/10/2021 07/09/2021  Weight (lbs) 179 lb 0.2 oz 181 lb 9.6 oz 201 lb 15.1 oz  Weight (kg) 81.2 kg 82.373 kg 91.6 kg      Telemetry     NSR, HR 80-90s, PVCs- Personally Reviewed  ECG    NO new - Personally Reviewed  Physical Exam   GEN: No acute distress.   Neck: No JVD Cardiac: RRR, no murmurs, rubs, or  gallops.  Respiratory: Clear to auscultation bilaterally. GI: Soft, nontender, non-distended  MS: No edema; No deformity. Neuro:  Nonfocal  Psych: Normal affect   Labs    High Sensitivity Troponin:   Recent Labs  Lab 07/09/21 2130 07/09/21 2331 07/10/21 0720  TROPONINIHS 1,355* 14,103* >24,000*     Chemistry Recent Labs  Lab 07/09/21 2130 07/10/21 0720 07/11/21 0420  NA 133* 138 137  K 5.3* 3.9 3.8  CL 103 103 102  CO2 21* 24 23  GLUCOSE 370* 118* 176*  BUN 31* 25* 26*  CREATININE 1.93* 1.43* 1.43*  CALCIUM 9.1 9.4 9.2  MG 2.4  --   --   PROT 8.0  --   --   ALBUMIN 4.2  --   --   AST 37  --   --   ALT 17  --   --   ALKPHOS 114  --   --   BILITOT 1.2  --   --   GFRNONAA 35* 50* 50*  ANIONGAP 9 11 12     Lipids  Recent Labs  Lab 07/10/21 0720  CHOL 129  TRIG 128  HDL 51  LDLCALC 52  CHOLHDL 2.5    Hematology Recent Labs  Lab 07/09/21 2130 07/10/21 0720 07/11/21 0420  WBC 20.5* 14.3* 11.7*  RBC 4.71 5.15 4.80  HGB 15.0 15.9 15.1  HCT 45.8 47.8 45.2  MCV 97.2 92.8 94.2  MCH 31.8 30.9 31.5  MCHC 32.8 33.3 33.4  RDW 13.9 14.0 14.1  PLT 325 314 243   Thyroid No results for input(s): TSH, FREET4 in the last 168 hours.  BNP Recent Labs  Lab 07/09/21 2130  BNP 231.9*    DDimer No results for input(s): DDIMER in the last 168 hours.   Radiology    CARDIAC CATHETERIZATION  Result Date: 07/10/2021 Conclusions: Severe native coronary artery disease with 90% distal LMCA stenosis, diffuse proximal LAD disease with functional mid vessel occlusion with competitive flow from LIMA-LAD, 99% proximal OM1 stenosis, and CTO of proximal RCA. Widely patent LIMA-LAD and SVG-OM3. Patent SVG-RPDA with 70% stenosis near distal anastomosis.  RPDA distal to SVG anastomosis is occluded (? culprit lesion for NSTEMI). Mildly elevated left ventricular filling pressure (LVEDP 15-20 mmHg). Mild-moderate aortic valve stenosis (peak-to-peak gradient ~20 mmHg).  Recommendations:  Medical therapy.  Would only consider PCI to distal anastomosis of SVG-RPDA for refractory symptoms. Load with clopidogrel 300 mg daily followed by 75 mg daily thereafter for 12 months. Aggressive secondary prevention and initiation of goal-directed medical therapy for acute systolic heart failure. Nelva Bush, MD [facility]  DG Chest Portable 1 View  Result Date: 07/09/2021 CLINICAL DATA:  Shortness of breath and chest pain. EXAM: PORTABLE CHEST 1 VIEW COMPARISON:  03/13/2018 FINDINGS: Post median sternotomy. Mild cardiomegaly. Mild interstitial and septal thickening typical of pulmonary edema. There may be trace pleural effusions. No pneumothorax. Patchy bibasilar opacities favor atelectasis. IMPRESSION: Cardiomegaly with mild pulmonary edema. Possible trace pleural effusions. Findings most consistent with CHF. Electronically Signed   By: Keith Rake M.D.   On: 07/09/2021 21:50   ECHOCARDIOGRAM COMPLETE  Result Date: 07/10/2021    ECHOCARDIOGRAM REPORT   Patient Name:   Bradley [name] Date of Exam: 07/10/2021 Medical Rec #:  [id number]    Height:       72.0 in Accession #:    [id number]   Weight:       201.9 lb Date of Birth:  06/21/1941    BSA:          2.139 m Patient Age:    80 years     BP:           147/70 mmHg Patient Gender: M            HR:           88 bpm. Exam Location:  [facility] Procedure: 2D Echo, Color Doppler, Cardiac Doppler and Intracardiac            Opacification Agent Indications:     I50.31 congestive heart failure-Acute Diastolic  History:         Patient has prior history of Echocardiogram examinations. Prior                  CABG; Risk Factors:Hypertension, Diabetes and Dyslipidemia.  Sonographer:     Charmayne Sheer Referring Phys:  [id number] Athena Masse Diagnosing Phys: Nelva Bush MD  Sonographer Comments: Suboptimal apical window and suboptimal subcostal window. IMPRESSIONS  1. Left ventricular ejection fraction, by estimation, is 30 to 35%. The left ventricle has  moderately  decreased function. The left ventricle demonstrates regional wall motion abnormalities (see scoring diagram/findings for description). There is mild left ventricular hypertrophy. Left ventricular diastolic parameters are consistent with Grade II diastolic dysfunction (pseudonormalization). Elevated left atrial pressure. There is severe hypokinesis of the left ventricular, entire anteroseptal wall, anterior wall and anterolateral wall.  2. Right ventricular systolic function is normal. The right ventricular size is normal.  3. The mitral valve is degenerative. Moderate mitral valve regurgitation. No evidence of mitral stenosis.  4. The aortic valve has an indeterminant number of cusps. There is moderate calcification of the aortic valve. There is severe thickening of the aortic valve. Aortic valve regurgitation is mild to moderate. Mild to moderate aortic valve stenosis.  5. There is mild dilatation of the ascending aorta, measuring 38 mm.  6. The inferior vena cava is normal in size with <50% respiratory variability, suggesting right atrial pressure of 8 mmHg. FINDINGS  Left Ventricle: Left ventricular ejection fraction, by estimation, is 30 to 35%. The left ventricle has moderately decreased function. The left ventricle demonstrates regional wall motion abnormalities. Severe hypokinesis of the left ventricular, entire  anteroseptal wall, anterior wall and anterolateral wall. Definity contrast agent was given IV to delineate the left ventricular endocardial borders. The left ventricular internal cavity size was normal in size. There is mild left ventricular hypertrophy. Left ventricular diastolic parameters are consistent with Grade II diastolic dysfunction (pseudonormalization). Elevated left atrial pressure. Right Ventricle: The right ventricular size is normal. No increase in right ventricular wall thickness. Right ventricular systolic function is normal. Left Atrium: Left atrial size was normal in  size. Right Atrium: Right atrial size was normal in size. Pericardium: There is no evidence of pericardial effusion. Mitral Valve: The mitral valve is degenerative in appearance. There is mild thickening of the mitral valve leaflet(s). Moderate mitral valve regurgitation. No evidence of mitral valve stenosis. MV peak gradient, 6.0 mmHg. The mean mitral valve gradient is 3.0 mmHg. Tricuspid Valve: The tricuspid valve is grossly normal. Tricuspid valve regurgitation is trivial. Aortic Valve: The aortic valve has an indeterminant number of cusps. There is moderate calcification of the aortic valve. There is severe thickening of the aortic valve. Aortic valve regurgitation is mild to moderate. Aortic regurgitation PHT measures 498 msec. Mild to moderate aortic stenosis is present. Aortic valve mean gradient measures 8.0 mmHg. Aortic valve peak gradient measures 15.1 mmHg. Aortic valve area, by VTI measures 1.85 cm. Pulmonic Valve: The pulmonic valve was not well visualized. Pulmonic valve regurgitation is not visualized. No evidence of pulmonic stenosis. Aorta: The aortic root is normal in size and structure. There is mild dilatation of the ascending aorta, measuring 38 mm. Pulmonary Artery: The pulmonary artery is not well seen. Venous: The inferior vena cava is normal in size with less than 50% respiratory variability, suggesting right atrial pressure of 8 mmHg. IAS/Shunts: No atrial level shunt detected by color flow Doppler.  LEFT VENTRICLE PLAX 2D LVIDd:         4.00 cm   Diastology LVIDs:         3.40 cm   LV e' medial:    5.00 cm/s LV PW:         1.31 cm   LV E/e' medial:  23.6 LV IVS:        1.09 cm   LV e' lateral:   6.64 cm/s LVOT diam:     2.20 cm   LV E/e' lateral: 17.8 LV SV:  68 LV SV Index:   32 LVOT Area:     3.80 cm  RIGHT VENTRICLE RV Basal diam:  3.10 cm LEFT ATRIUM             Index        RIGHT ATRIUM           Index LA diam:        4.20 cm 1.96 cm/m   RA Area:     12.30 cm LA Vol (A2C):    57.3 ml 26.78 ml/m  RA Volume:   27.10 ml  12.67 ml/m LA Vol (A4C):   48.3 ml 22.58 ml/m LA Biplane Vol: 52.8 ml 24.68 ml/m  AORTIC VALVE AV Area (Vmax):    1.68 cm AV Area (Vmean):   1.77 cm AV Area (VTI):     1.85 cm AV Vmax:           194.00 cm/s AV Vmean:          131.000 cm/s AV VTI:            0.367 m AV Peak Grad:      15.1 mmHg AV Mean Grad:      8.0 mmHg LVOT Vmax:         85.50 cm/s LVOT Vmean:        61.000 cm/s LVOT VTI:          0.179 m LVOT/AV VTI ratio: 0.49 AI PHT:            498 msec  AORTA Ao Root diam: 3.30 cm MITRAL VALVE MV Area (PHT): 4.96 cm     SHUNTS MV Area VTI:   2.63 cm     Systemic VTI:  0.18 m MV Peak grad:  6.0 mmHg     Systemic Diam: 2.20 cm MV Mean grad:  3.0 mmHg MV Vmax:       1.22 m/s MV Vmean:      72.9 cm/s MV Decel Time: 153 msec MV E velocity: 118.00 cm/s MV A velocity: 67.30 cm/s MV E/A ratio:  1.75 Cristal Deer End MD Electronically signed by Yvonne Kendall MD Signature Date/Time: 07/10/2021/9:19:21 AM    Final     Cardiac Studies   LHC 07/10/21 Conclusions: Severe native coronary artery disease with 90% distal LMCA stenosis, diffuse proximal LAD disease with functional mid vessel occlusion with competitive flow from LIMA-LAD, 99% proximal OM1 stenosis, and CTO of proximal RCA. Widely patent LIMA-LAD and SVG-OM3. Patent SVG-RPDA with 70% stenosis near distal anastomosis.  RPDA distal to SVG anastomosis is occluded (? culprit lesion for NSTEMI). Mildly elevated left ventricular filling pressure (LVEDP 15-20 mmHg). Mild-moderate aortic valve stenosis (peak-to-peak gradient ~20 mmHg).   Recommendations: Medical therapy.  Would only consider PCI to distal anastomosis of SVG-RPDA for refractory symptoms. Load with clopidogrel 300 mg daily followed by 75 mg daily thereafter for 12 months. Aggressive secondary prevention and initiation of goal-directed medical therapy for acute systolic heart failure.   Yvonne Kendall, MD [facility]  Coronary  Diagrams  Diagnostic Dominance: Right     Echo 07/10/21 1. Left ventricular ejection fraction, by estimation, is 30 to 35%. The  left ventricle has moderately decreased function. The left ventricle  demonstrates regional wall motion abnormalities (see scoring  diagram/findings for description). There is mild  left ventricular hypertrophy. Left ventricular diastolic parameters are  consistent with Grade II diastolic dysfunction (pseudonormalization).  Elevated left atrial pressure. There is severe hypokinesis of the left  ventricular, entire anteroseptal wall,  anterior wall and anterolateral wall.   2. Right ventricular systolic function is normal. The right ventricular  size is normal.   3. The mitral valve is degenerative. Moderate mitral valve regurgitation.  No evidence of mitral stenosis.   4. The aortic valve has an indeterminant number of cusps. There is  moderate calcification of the aortic valve. There is severe thickening of  the aortic valve. Aortic valve regurgitation is mild to moderate. Mild to  moderate aortic valve stenosis.   5. There is mild dilatation of the ascending aorta, measuring 38 mm.   6. The inferior vena cava is normal in size with <50% respiratory  variability, suggesting right atrial pressure of 8 mmHg.    Patient Profile     80 y.o. male medical history significant for coronary artery disease status post CABG in the setting of NSTEMI in 2016 at [facility] (LIMA-LAD, SVG-OM1, and SVG-RPDA), valvular heart disease (details uncertain), chronic diastolic heart failure, hypertension, hyperlipidemia, type 2 diabetes mellitus, and chronic kidney disease stage III who is being seen 07/10/2021 for the evaluation of NSTEMI.  Assessment & Plan    NSTEMI H/o CABG - presented with chest pain an SOB. HS trop>24,000 - cath showed 90% dLMCA stenosis, diffuse LAD disease with functional mid vessel occlusion with competitive flow from LIMA-LAD, 99% pOM1 stenosis, CTO of  pRCA, widely patent LIMA-LAD and SVG-OM3, patent SVG-RPDA with 70% stenosis near distal anastomosis, mildly elevated LV filling pressure, mild to mod aortic valve stenosis with pear to peak gradient 60mmHg - Recommended medical therapy, unless refractory symptoms would consider PCI to distal anastomosis of SVG to RPDA.  - He was loaded with plavix and started on 75mg  daily - continue Aspirin, Coreg, statin, Imdur 15mg  daily - Cath site, left radial, stable - Patient is up and about the room with no anginal symptoms - [facility]  Acute on chronic combined CHF exacerbation - BNP 231 - lasix 40mg  daily - continue Coreg - Echo showed LVEF 30-35%, WMA, G2DD, normal RV function, mod MR, mild to mod AS, mild Ascending aortic dilation - Appears euvolemic on exam - Continue GDMT as able  DM2 - per IM  HTN - BP labile - continue Coreg - started on Imdur 15mg  daily  AKI on CKD stage 3 - Scr 1.43, BUN 26 - kidney function stable  For questions or updates, please contact [facility] Please consult [email or web address] for contact info under        Signed, Bellah Alia Ninfa Meeker, PA-C  07/11/2021, 9:44 AM

## 2021-07-11 NOTE — Discharge Summary (Signed)
Physician Discharge Summary  Wilho Sharpley QMG:[id number] DOB: December 06, 1940 DOA: 07/09/2021  PCP: Aldona Bar, MD  Admit date: 07/09/2021 Discharge date: 07/11/2021  Admitted From: home Disposition:  home  Recommendations for Outpatient Follow-up:  Follow up with PCP in 1-2 weeks Please obtain BMP/CBC in one week Please follow up with Cardiology at the [facility]   Home Health: home  Equipment/Devices: home   Discharge Condition: Stable  CODE STATUS: Full  Diet recommendation: Heart Healthy / Carb Modified     Discharge Diagnoses: Principal Problem:   NSTEMI (non-ST elevated myocardial infarction) (HCC) Active Problems:   Acute on chronic diastolic CHF (congestive heart failure) (HCC)   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 Fran Lowes: "Khan Chura is a 80 y.o. male with medical history significant for CAD s/p CABG 2016, HTN, DM, CKD 3 and diastolic CHF, followed at the [place] who presents to the ED with sudden onset chest pain and shortness of breath following a meal."   NSTEMI / Hx of CAD status CABG - Troponin peaked >24,000.   Started on heparin drip on admission.   Cardiology took patient for left heart cath showing PDA occlusion distal to the vein graft anastomosis site. Medical management was recommended --Management per cardiology  Per cardiology, would only consider PCI to distal anastomosis of SVG-RPDA for refractory symptoms. --Patient was loaded on Plavix 300 mg, continued on 75 mg daily. Plan for 12 months DAPT with ASA/Plavix. --Aggressive secondary prevention and initiation of goal-directed medical therapy for acute systolic heart failure. --continue ASA, Plavix and statin  At time of discharge, patient is free of chest pain both at rest and with ambulation/activity.  Patient is to follow up with Cardiology at the [facility].     Acute on chronic  combined systolic/diastolic CHF  Echo showed LVEF 30-35%, with grade II diastolic dysfunction.  LV showed regional WMA's, likely due to NSTEMI. Chest xray showed cardiomegaly with mild pulmonary edema.   Started on IV Lasix on admission.   Respiratory symptoms improved with diuresis --discharged on PO Lasix, dosing per Cardiology --Continue Coreg --Daily weights at home --Cardiology follow up   Hyperglycemia due to type 2 diabetes mellitus -  Last Hbg A1c 8.6%, uncontrolled.   Does not appear to take insulin at home. Home Jardiance held during admission, resumed at d/c. Covered with insulin during admission.    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   Complete by: As directed    Call MD for:  extreme fatigue   Complete by: As directed    Call MD for:  persistant dizziness or light-headedness   Complete by: As directed    Call MD for:  persistant nausea and vomiting   Complete by: As directed  Call MD for:  severe uncontrolled pain   Complete by: As directed    Call MD for:  temperature >100.4   Complete by: As directed    Diet - low sodium heart healthy   Complete by: As directed    Discharge instructions   Complete by: As directed    Please take all medications as prescribed.  Be sure to schedule follow up with cardiology at the [facility] within 2-4 weeks.  You can use Nitroglycerin if having chest pain.  If the pain  does not improve after 3rd dose of Nitro, call EMS.  Your Lasix (furosemide) was increased to 40 mg.   --Nicole Kindred, DO   [facility]   Increase activity slowly   Complete by: As directed       Allergies as of 07/11/2021       Reactions   Simvastatin         Medication List     STOP taking these medications    hydrochlorothiazide 25 MG tablet Commonly known as: HYDRODIURIL   metoprolol tartrate 25 MG tablet Commonly known as: LOPRESSOR       TAKE these medications    acetaminophen 325 MG tablet Commonly known as: TYLENOL Take 650 mg by mouth 2 (two) times daily as needed.   aspirin EC 81 MG tablet Take 1 tablet by mouth daily.   atorvastatin 80 MG tablet Commonly known as: LIPITOR Take 40 mg by mouth daily.   Brinzolamide-Brimonidine 1-0.2 % Susp Place 1 drop into the left eye 3 (three) times daily.   carvedilol 3.125 MG tablet Commonly known as: COREG Take 1 tablet (3.125 mg total) by mouth 2 (two) times daily with a meal.   clopidogrel 75 MG tablet Commonly known as: PLAVIX Take 1 tablet (75 mg total) by mouth daily with breakfast. Start taking on: July 12, 2021   clotrimazole 1 % cream Commonly known as: LOTRIMIN Apply 1 application topically 2 (two) times daily.   empagliflozin 25 MG Tabs tablet Commonly known as: JARDIANCE Take 12.5 mg by mouth every morning.   furosemide 40 MG tablet Commonly known as: LASIX Take 1 tablet (40 mg total) by mouth daily. Start taking on: July 12, 2021 What changed:  medication strength how much to take   hydrocortisone cream 1 % Apply 1 application topically 2 (two) times daily.   isosorbide mononitrate 30 MG 24 hr tablet Commonly known as: IMDUR Take 0.5 tablets (15 mg total) by mouth daily. Start taking on: July 12, 2021 What changed:  medication strength how much to take   nitroGLYCERIN 0.4 MG SL tablet Commonly known as: NITROSTAT Place 1 tablet (0.4 mg total) under  the tongue every 5 (five) minutes x 3 doses as needed for chest pain.   pantoprazole 40 MG tablet Commonly known as: PROTONIX Take 40 mg by mouth daily.   Propylene Glycol 0.6 % Soln Place 1 drop into both eyes 4 (four) times daily.   timolol 0.5 % ophthalmic gel-forming Commonly known as: TIMOPTIC-XR Place 1 drop into both eyes daily.   travoprost (benzalkonium) 0.004 % ophthalmic solution Commonly known as: TRAVATAN Place 1 drop into both eyes nightly.   vitamin B-12 500 MCG tablet Commonly known as: CYANOCOBALAMIN Take 1,000 mcg by mouth daily. Before breakfast        Allergies  Allergen Reactions   Simvastatin      If you experience worsening of your admission symptoms, develop shortness of breath, life threatening emergency, suicidal or homicidal  thoughts you must seek medical attention immediately by calling 911 or calling your MD immediately  if symptoms less severe.    Please note   You were cared for by a hospitalist during your hospital stay. If you have any questions about your discharge medications or the care you received while you were in the hospital after you are discharged, you can call the unit and asked to speak with the hospitalist on call if the hospitalist that took care of you is not available. Once you are discharged, your primary care physician will handle any further medical issues. Please note that NO REFILLS for any discharge medications will be authorized once you are discharged, as it is imperative that you return to your primary care physician (or establish a relationship with a primary care physician if you do not have one) for your aftercare needs so that they can reassess your need for medications and monitor your lab values.   Consultations: Cardiology    Procedures/Studies: CARDIAC CATHETERIZATION  Result Date: 07/10/2021 Conclusions: Severe native coronary artery disease with 90% distal LMCA stenosis, diffuse proximal LAD disease  with functional mid vessel occlusion with competitive flow from LIMA-LAD, 99% proximal OM1 stenosis, and CTO of proximal RCA. Widely patent LIMA-LAD and SVG-OM3. Patent SVG-RPDA with 70% stenosis near distal anastomosis.  RPDA distal to SVG anastomosis is occluded (? culprit lesion for NSTEMI). Mildly elevated left ventricular filling pressure (LVEDP 15-20 mmHg). Mild-moderate aortic valve stenosis (peak-to-peak gradient ~20 mmHg).  Recommendations: Medical therapy.  Would only consider PCI to distal anastomosis of SVG-RPDA for refractory symptoms. Load with clopidogrel 300 mg daily followed by 75 mg daily thereafter for 12 months. Aggressive secondary prevention and initiation of goal-directed medical therapy for acute systolic heart failure. Nelva Bush, MD [facility]  DG Chest Portable 1 View  Result Date: 07/09/2021 CLINICAL DATA:  Shortness of breath and chest pain. EXAM: PORTABLE CHEST 1 VIEW COMPARISON:  03/13/2018 FINDINGS: Post median sternotomy. Mild cardiomegaly. Mild interstitial and septal thickening typical of pulmonary edema. There may be trace pleural effusions. No pneumothorax. Patchy bibasilar opacities favor atelectasis. IMPRESSION: Cardiomegaly with mild pulmonary edema. Possible trace pleural effusions. Findings most consistent with CHF. Electronically Signed   By: Keith Rake M.D.   On: 07/09/2021 21:50   ECHOCARDIOGRAM COMPLETE  Result Date: 07/10/2021    ECHOCARDIOGRAM REPORT   Patient Name:   JAMARIEN MUDDIMAN Date of Exam: 07/10/2021 Medical Rec #:  [id number]    Height:       72.0 in Accession #:    [id number]   Weight:       201.9 lb Date of Birth:  24-Dec-1940    BSA:          2.139 m Patient Age:    51 years     BP:           147/70 mmHg Patient Gender: M            HR:           88 bpm. Exam Location:  [facility] Procedure: 2D Echo, Color Doppler, Cardiac Doppler and Intracardiac            Opacification Agent Indications:     I50.31 congestive heart failure-Acute Diastolic   History:         Patient has prior history of Echocardiogram examinations. Prior                  CABG; Risk Factors:Hypertension, Diabetes and Dyslipidemia.  Sonographer:  Charmayne Sheer Referring Phys:  [id number] Athena Masse Diagnosing Phys: Nelva Bush MD  Sonographer Comments: Suboptimal apical window and suboptimal subcostal window. IMPRESSIONS  1. Left ventricular ejection fraction, by estimation, is 30 to 35%. The left ventricle has moderately decreased function. The left ventricle demonstrates regional wall motion abnormalities (see scoring diagram/findings for description). There is mild left ventricular hypertrophy. Left ventricular diastolic parameters are consistent with Grade II diastolic dysfunction (pseudonormalization). Elevated left atrial pressure. There is severe hypokinesis of the left ventricular, entire anteroseptal wall, anterior wall and anterolateral wall.  2. Right ventricular systolic function is normal. The right ventricular size is normal.  3. The mitral valve is degenerative. Moderate mitral valve regurgitation. No evidence of mitral stenosis.  4. The aortic valve has an indeterminant number of cusps. There is moderate calcification of the aortic valve. There is severe thickening of the aortic valve. Aortic valve regurgitation is mild to moderate. Mild to moderate aortic valve stenosis.  5. There is mild dilatation of the ascending aorta, measuring 38 mm.  6. The inferior vena cava is normal in size with <50% respiratory variability, suggesting right atrial pressure of 8 mmHg. FINDINGS  Left Ventricle: Left ventricular ejection fraction, by estimation, is 30 to 35%. The left ventricle has moderately decreased function. The left ventricle demonstrates regional wall motion abnormalities. Severe hypokinesis of the left ventricular, entire  anteroseptal wall, anterior wall and anterolateral wall. Definity contrast agent was given IV to delineate the left ventricular endocardial borders.  The left ventricular internal cavity size was normal in size. There is mild left ventricular hypertrophy. Left ventricular diastolic parameters are consistent with Grade II diastolic dysfunction (pseudonormalization). Elevated left atrial pressure. Right Ventricle: The right ventricular size is normal. No increase in right ventricular wall thickness. Right ventricular systolic function is normal. Left Atrium: Left atrial size was normal in size. Right Atrium: Right atrial size was normal in size. Pericardium: There is no evidence of pericardial effusion. Mitral Valve: The mitral valve is degenerative in appearance. There is mild thickening of the mitral valve leaflet(s). Moderate mitral valve regurgitation. No evidence of mitral valve stenosis. MV peak gradient, 6.0 mmHg. The mean mitral valve gradient is 3.0 mmHg. Tricuspid Valve: The tricuspid valve is grossly normal. Tricuspid valve regurgitation is trivial. Aortic Valve: The aortic valve has an indeterminant number of cusps. There is moderate calcification of the aortic valve. There is severe thickening of the aortic valve. Aortic valve regurgitation is mild to moderate. Aortic regurgitation PHT measures 498 msec. Mild to moderate aortic stenosis is present. Aortic valve mean gradient measures 8.0 mmHg. Aortic valve peak gradient measures 15.1 mmHg. Aortic valve area, by VTI measures 1.85 cm. Pulmonic Valve: The pulmonic valve was not well visualized. Pulmonic valve regurgitation is not visualized. No evidence of pulmonic stenosis. Aorta: The aortic root is normal in size and structure. There is mild dilatation of the ascending aorta, measuring 38 mm. Pulmonary Artery: The pulmonary artery is not well seen. Venous: The inferior vena cava is normal in size with less than 50% respiratory variability, suggesting right atrial pressure of 8 mmHg. IAS/Shunts: No atrial level shunt detected by color flow Doppler.  LEFT VENTRICLE PLAX 2D LVIDd:         4.00 cm    Diastology LVIDs:         3.40 cm   LV e' medial:    5.00 cm/s LV PW:         1.31 cm   LV E/e' medial:  23.6 LV IVS:        1.09 cm   LV e' lateral:   6.64 cm/s LVOT diam:     2.20 cm   LV E/e' lateral: 17.8 LV SV:         68 LV SV Index:   32 LVOT Area:     3.80 cm  RIGHT VENTRICLE RV Basal diam:  3.10 cm LEFT ATRIUM             Index        RIGHT ATRIUM           Index LA diam:        4.20 cm 1.96 cm/m   RA Area:     12.30 cm LA Vol (A2C):   57.3 ml 26.78 ml/m  RA Volume:   27.10 ml  12.67 ml/m LA Vol (A4C):   48.3 ml 22.58 ml/m LA Biplane Vol: 52.8 ml 24.68 ml/m  AORTIC VALVE AV Area (Vmax):    1.68 cm AV Area (Vmean):   1.77 cm AV Area (VTI):     1.85 cm AV Vmax:           194.00 cm/s AV Vmean:          131.000 cm/s AV VTI:            0.367 m AV Peak Grad:      15.1 mmHg AV Mean Grad:      8.0 mmHg LVOT Vmax:         85.50 cm/s LVOT Vmean:        61.000 cm/s LVOT VTI:          0.179 m LVOT/AV VTI ratio: 0.49 AI PHT:            498 msec  AORTA Ao Root diam: 3.30 cm MITRAL VALVE MV Area (PHT): 4.96 cm     SHUNTS MV Area VTI:   2.63 cm     Systemic VTI:  0.18 m MV Peak grad:  6.0 mmHg     Systemic Diam: 2.20 cm MV Mean grad:  3.0 mmHg MV Vmax:       1.22 m/s MV Vmean:      72.9 cm/s MV Decel Time: 153 msec MV E velocity: 118.00 cm/s MV A velocity: 67.30 cm/s MV E/A ratio:  1.75 Harrell Gave End MD Electronically signed by Nelva Bush MD Signature Date/Time: 07/10/2021/9:19:21 AM    Final        Subjective: Pt reports feeling well.  No chest pain, SOB, palpitation or other complaints.  No chest pain with ambulating.   Discharge Exam: Vitals:   07/11/21 0823 07/11/21 1117  BP: (!) 154/95 126/73  Pulse: (!) 105 84  Resp: 16 18  Temp: 97.9 F (36.6 C) 98.1 F (36.7 C)  SpO2: 95% 97%   Vitals:   07/11/21 0426 07/11/21 0621 07/11/21 0823 07/11/21 1117  BP: 114/67  (!) 154/95 126/73  Pulse: 93  (!) 105 84  Resp: 16  16 18   Temp: 98.4 F (36.9 C)  97.9 F (36.6 C) 98.1 F (36.7 C)   TempSrc:    Oral  SpO2: 95%  95% 97%  Weight:  81.2 kg    Height:        General: Pt is alert, awake, not in acute distress Cardiovascular: RRR, S1/S2 +, no rubs, no gallops Respiratory: CTA bilaterally, no wheezing, no rhonchi Abdominal: Soft, NT, ND, bowel sounds + Extremities: no edema, no cyanosis    The  results of significant diagnostics from this hospitalization (including imaging, microbiology, ancillary and laboratory) are listed below for reference.     Microbiology: Recent Results (from the past 240 hour(s))  Resp Panel by RT-PCR (Flu A&B, Covid) Nasopharyngeal Swab     Status: None   Collection Time: 07/09/21  9:45 PM   Specimen: Nasopharyngeal Swab; Nasopharyngeal(NP) swabs in vial transport medium  Result Value Ref Range Status   SARS Coronavirus 2 by RT PCR NEGATIVE NEGATIVE Final    Comment: (NOTE) SARS-CoV-2 target nucleic acids are NOT DETECTED.  The SARS-CoV-2 RNA is generally detectable in upper respiratory specimens during the acute phase of infection. The lowest concentration of SARS-CoV-2 viral copies this assay can detect is 138 copies/mL. A negative result does not preclude SARS-Cov-2 infection and should not be used as the sole basis for treatment or other patient management decisions. A negative result may occur with  improper specimen collection/handling, submission of specimen other than nasopharyngeal swab, presence of viral mutation(s) within the areas targeted by this assay, and inadequate number of viral copies(<138 copies/mL). A negative result must be combined with clinical observations, patient history, and epidemiological information. The expected result is Negative.  Fact Sheet for Patients:  EntrepreneurPulse.com.au  Fact Sheet for Healthcare Providers:  IncredibleEmployment.be  This test is no t yet approved or cleared by the [place] FDA and  has been authorized for detection and/or  diagnosis of SARS-CoV-2 by FDA under an Emergency Use Authorization (EUA). This EUA will remain  in effect (meaning this test can be used) for the duration of the COVID-19 declaration under Section 564(b)(1) of the Act, 21 U.S.C.section 360bbb-3(b)(1), unless the authorization is terminated  or revoked sooner.       Influenza A by PCR NEGATIVE NEGATIVE Final   Influenza B by PCR NEGATIVE NEGATIVE Final    Comment: (NOTE) The Xpert Xpress SARS-CoV-2/FLU/RSV plus assay is intended as an aid in the diagnosis of influenza from Nasopharyngeal swab specimens and should not be used as a sole basis for treatment. Nasal washings and aspirates are unacceptable for Xpert Xpress SARS-CoV-2/FLU/RSV testing.  Fact Sheet for Patients: EntrepreneurPulse.com.au  Fact Sheet for Healthcare Providers: IncredibleEmployment.be  This test is not yet approved or cleared by the [place] FDA and has been authorized for detection and/or diagnosis of SARS-CoV-2 by FDA under an Emergency Use Authorization (EUA). This EUA will remain in effect (meaning this test can be used) for the duration of the COVID-19 declaration under Section 564(b)(1) of the Act, 21 U.S.C. section 360bbb-3(b)(1), unless the authorization is terminated or revoked.  Performed at [facility], [address] [place], [place] [address]      Labs: BNP (last 3 results) Recent Labs    07/09/21 2130  BNP 123XX123*   Basic Metabolic Panel: Recent Labs  Lab 07/09/21 2130 07/10/21 0720 07/11/21 0420  NA 133* 138 137  K 5.3* 3.9 3.8  CL 103 103 102  CO2 21* 24 23  GLUCOSE 370* 118* 176*  BUN 31* 25* 26*  CREATININE 1.93* 1.43* 1.43*  CALCIUM 9.1 9.4 9.2  MG 2.4  --   --    Liver Function Tests: Recent Labs  Lab 07/09/21 2130  AST 37  ALT 17  ALKPHOS 114  BILITOT 1.2  PROT 8.0  ALBUMIN 4.2   No results for input(s): LIPASE, AMYLASE in the last 168 hours. No results  for input(s): AMMONIA in the last 168 hours. CBC: Recent Labs  Lab 07/09/21 2130 07/10/21 0720  07/11/21 0420  WBC 20.5* 14.3* 11.7*  NEUTROABS 16.7*  --   --   HGB 15.0 15.9 15.1  HCT 45.8 47.8 45.2  MCV 97.2 92.8 94.2  PLT 325 314 243   Cardiac Enzymes: No results for input(s): CKTOTAL, CKMB, CKMBINDEX, TROPONINI in the last 168 hours. BNP: Invalid input(s): POCBNP CBG: Recent Labs  Lab 07/10/21 1346 07/10/21 1559 07/10/21 2017 07/11/21 0824 07/11/21 1117  GLUCAP 281* 236* 176* 194* 199*   D-Dimer No results for input(s): DDIMER in the last 72 hours. Hgb A1c Recent Labs    07/10/21 0720  HGBA1C 8.6*   Lipid Profile Recent Labs    07/10/21 0720  CHOL 129  HDL 51  LDLCALC 52  TRIG 128  CHOLHDL 2.5   Thyroid function studies No results for input(s): TSH, T4TOTAL, T3FREE, THYROIDAB in the last 72 hours.  Invalid input(s): FREET3 Anemia work up No results for input(s): VITAMINB12, FOLATE, FERRITIN, TIBC, IRON, RETICCTPCT in the last 72 hours. Urinalysis No results found for: COLORURINE, APPEARANCEUR, [place], [place], GLUCOSEU, [place], [place], [place], PROTEINUR, UROBILINOGEN, NITRITE, LEUKOCYTESUR Sepsis Labs Invalid input(s): PROCALCITONIN,  WBC,  LACTICIDVEN Microbiology Recent Results (from the past 240 hour(s))  Resp Panel by RT-PCR (Flu A&B, Covid) Nasopharyngeal Swab     Status: None   Collection Time: 07/09/21  9:45 PM   Specimen: Nasopharyngeal Swab; Nasopharyngeal(NP) swabs in vial transport medium  Result Value Ref Range Status   SARS Coronavirus 2 by RT PCR NEGATIVE NEGATIVE Final    Comment: (NOTE) SARS-CoV-2 target nucleic acids are NOT DETECTED.  The SARS-CoV-2 RNA is generally detectable in upper respiratory specimens during the acute phase of infection. The lowest concentration of SARS-CoV-2 viral copies this assay can detect is 138 copies/mL. A negative result does not preclude SARS-Cov-2 infection and should not be used as the  sole basis for treatment or other patient management decisions. A negative result may occur with  improper specimen collection/handling, submission of specimen other than nasopharyngeal swab, presence of viral mutation(s) within the areas targeted by this assay, and inadequate number of viral copies(<138 copies/mL). A negative result must be combined with clinical observations, patient history, and epidemiological information. The expected result is Negative.  Fact Sheet for Patients:  EntrepreneurPulse.com.au  Fact Sheet for Healthcare Providers:  IncredibleEmployment.be  This test is no t yet approved or cleared by the [place] FDA and  has been authorized for detection and/or diagnosis of SARS-CoV-2 by FDA under an Emergency Use Authorization (EUA). This EUA will remain  in effect (meaning this test can be used) for the duration of the COVID-19 declaration under Section 564(b)(1) of the Act, 21 U.S.C.section 360bbb-3(b)(1), unless the authorization is terminated  or revoked sooner.       Influenza A by PCR NEGATIVE NEGATIVE Final   Influenza B by PCR NEGATIVE NEGATIVE Final    Comment: (NOTE) The Xpert Xpress SARS-CoV-2/FLU/RSV plus assay is intended as an aid in the diagnosis of influenza from Nasopharyngeal swab specimens and should not be used as a sole basis for treatment. Nasal washings and aspirates are unacceptable for Xpert Xpress SARS-CoV-2/FLU/RSV testing.  Fact Sheet for Patients: EntrepreneurPulse.com.au  Fact Sheet for Healthcare Providers: IncredibleEmployment.be  This test is not yet approved or cleared by the [place] FDA and has been authorized for detection and/or diagnosis of SARS-CoV-2 by FDA under an Emergency Use Authorization (EUA). This EUA will remain in effect (meaning this test can be used) for the duration of the COVID-19 declaration under Section  564(b)(1) of the  Act, 21 U.S.C. section 360bbb-3(b)(1), unless the authorization is terminated or revoked.  Performed at [facility], [address] [place], [place] [address]      Time coordinating discharge: Over 30 minutes  SIGNED:   Ezekiel Slocumb, DO [facility] 07/11/2021, 12:54 PM   If 7PM-7AM, please contact night-coverage [email or web address]

## 2021-07-11 NOTE — Consult Note (Signed)
   Heart Failure Nurse Navigator Note  HFrEF 30-35%.  Mild LVH.  Grade 2 diastolic dysfunction.  Severe hypokinesis of the left ventricular entire anterior septal wall, anterior wall and anterior lateral wall.  Normal right ventricular systolic function moderate mitral regurgitation.  Aortic regurgitation is mild to moderate with mild to moderate aortic valve stenosis  He presented to the emergency room with complaints of chest pain.  Did note some orthopnea few days prior to admission.  Chest x-ray was consistent with congestive heart failure.  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  Initial meeting with patient today, he is lying in bed in no acute distress.  States that he is being discharged to home where he lives by himself.  Discussed his meals, he states that he prepares his own meals, discussed the types of food to avoid.  Does admit to eating bacon, sausage and using canned soups.  Explained limiting to 2000 mg daily allotment of sodium, there were meals where he wanted something special that was higher in sodium to try to adjust with the other 2 meals of the day.  Also discussed fluid restriction and the importance of.  He states that he has a scale at home but is currently needing a battery.  Splane the reasoning behind daily weights and what to report.  Also talked with him about the outpatient heart failure clinic.  He states that he currently has a cardiologist and his doctor at the [place] in [place].  Asked that he consider following with the  outpatient heart failure clinic of which she has an appointment on November 29 at 330.  He was given information about the outpatient heart failure clinic.  He was also given the living with heart failure teaching booklet along with information on low-sodium and the zone magnet.   Tresa Endo RN CHFN

## 2021-07-11 NOTE — Progress Notes (Signed)
Inpatient Diabetes Program Recommendations  AACE/ADA: New Consensus Statement on Inpatient Glycemic Control (2015)  Target Ranges:  Prepandial:   less than 140 mg/dL      Peak postprandial:   less than 180 mg/dL (1-2 hours)      Critically ill patients:  140 - 180 mg/dL   Lab Results  Component Value Date   GLUCAP 199 (H) 07/11/2021   HGBA1C 8.6 (H) 07/10/2021    Review of Glycemic Control Results for LANDIS, DOWDY (MRN [id number]) as of 07/11/2021 12:04  Ref. Range 07/10/2021 15:59 07/10/2021 20:17 07/11/2021 08:24 07/11/2021 11:17  Glucose-Capillary Latest Ref Range: 70 - 99 mg/dL 947 (H) 654 (H) 650 (H) 199 (H)   Diabetes history: DM2 Outpatient Diabetes medications:  Jardiance 25 mg QD Current orders for Inpatient glycemic control:  Novolog 0-15 units TID and 0-5 units QHS   Inpatient Diabetes Program Recommendations:    If CBG's remain elevated might consider, Semglee 8 units QD (0.1 unit/kg).  Will continue to follow while inpatient.  Thank you, Dulce Sellar, RN, BSN Diabetes Coordinator Inpatient Diabetes Program [phone] (team pager from 8a-5p)

## 2021-07-22 NOTE — Progress Notes (Deleted)
   Patient ID: Bradley [name], male    DOB: Jan 22, 1941, 80 y.o.   MRN: [id number]  HPI  Bradley [name] is a 80 y/o male with a history of  Echo report from 07/10/21 reviewed and showed an EF of 30-35% along with mild LVH, moderate Bradley and mild/moderate AS.   LHC done 07/10/21 and showed: Severe native coronary artery disease with 90% distal LMCA stenosis, diffuse proximal LAD disease with functional mid vessel occlusion with competitive flow from LIMA-LAD, 99% proximal OM1 stenosis, and CTO of proximal RCA. Widely patent LIMA-LAD and SVG-OM3. Patent SVG-RPDA with 70% stenosis near distal anastomosis.  RPDA distal to SVG anastomosis is occluded (? culprit lesion for NSTEMI). Mildly elevated left ventricular filling pressure (LVEDP 15-20 mmHg). Mild-moderate aortic valve stenosis (peak-to-peak gradient ~20 mmHg).  Admitted 07/09/21 due to chest pain and shortness of breath. Placed on heparin drip along with IV lasix. Cardiology consult obtained. Cath completed.         Discharged after 2 days.   He presents today for his initial visit with a chief complaint  Review of Systems    Physical Exam  Assessment & Plan:  1: Chronic heart failure with reduced ejection fraction- - NYHA class  - [facility] - BNP 07/09/21 was 231.9  2: HTN- - BP - saw PCP at [facility] on 05/16/21 - BMP 07/11/21 reviewed and showed sodium 137, potassium 3.8, creatinine 1.43 and GFR 50  3: DM- - A1c 07/10/21 was 8.6%

## 2021-07-24 ENCOUNTER — Ambulatory Visit: Payer: Non-veteran care | Admitting: Family

## 2021-08-05 ENCOUNTER — Observation Stay
Admission: EM | Admit: 2021-08-05 | Discharge: 2021-08-06 | Disposition: A | Discharge status: Home / Self Care | Payer: No Typology Code available for payment source | Attending: Internal Medicine | Admitting: Internal Medicine

## 2021-08-05 ENCOUNTER — Emergency Department: Payer: No Typology Code available for payment source

## 2021-08-05 ENCOUNTER — Encounter: Payer: Self-pay | Admitting: Emergency Medicine

## 2021-08-05 ENCOUNTER — Other Ambulatory Visit: Payer: Self-pay

## 2021-08-05 DIAGNOSIS — H409 Unspecified glaucoma: Secondary | ICD-10-CM

## 2021-08-05 DIAGNOSIS — Z7982 Long term (current) use of aspirin: Secondary | ICD-10-CM | POA: Insufficient documentation

## 2021-08-05 DIAGNOSIS — I5022 Chronic systolic (congestive) heart failure: Secondary | ICD-10-CM | POA: Diagnosis not present

## 2021-08-05 DIAGNOSIS — R0789 Other chest pain: Secondary | ICD-10-CM | POA: Diagnosis present

## 2021-08-05 DIAGNOSIS — Z951 Presence of aortocoronary bypass graft: Secondary | ICD-10-CM

## 2021-08-05 DIAGNOSIS — I209 Angina pectoris, unspecified: Secondary | ICD-10-CM

## 2021-08-05 DIAGNOSIS — I2510 Atherosclerotic heart disease of native coronary artery without angina pectoris: Secondary | ICD-10-CM | POA: Diagnosis not present

## 2021-08-05 DIAGNOSIS — Z79899 Other long term (current) drug therapy: Secondary | ICD-10-CM | POA: Insufficient documentation

## 2021-08-05 DIAGNOSIS — Z87891 Personal history of nicotine dependence: Secondary | ICD-10-CM | POA: Insufficient documentation

## 2021-08-05 DIAGNOSIS — J811 Chronic pulmonary edema: Secondary | ICD-10-CM

## 2021-08-05 DIAGNOSIS — N183 Chronic kidney disease, stage 3 unspecified: Secondary | ICD-10-CM

## 2021-08-05 DIAGNOSIS — I25118 Atherosclerotic heart disease of native coronary artery with other forms of angina pectoris: Secondary | ICD-10-CM

## 2021-08-05 DIAGNOSIS — I5043 Acute on chronic combined systolic (congestive) and diastolic (congestive) heart failure: Secondary | ICD-10-CM | POA: Insufficient documentation

## 2021-08-05 DIAGNOSIS — E1122 Type 2 diabetes mellitus with diabetic chronic kidney disease: Secondary | ICD-10-CM | POA: Diagnosis not present

## 2021-08-05 DIAGNOSIS — E119 Type 2 diabetes mellitus without complications: Secondary | ICD-10-CM | POA: Insufficient documentation

## 2021-08-05 DIAGNOSIS — R079 Chest pain, unspecified: Secondary | ICD-10-CM

## 2021-08-05 DIAGNOSIS — I110 Hypertensive heart disease with heart failure: Secondary | ICD-10-CM | POA: Diagnosis not present

## 2021-08-05 DIAGNOSIS — I200 Unstable angina: Secondary | ICD-10-CM

## 2021-08-05 DIAGNOSIS — I5021 Acute systolic (congestive) heart failure: Secondary | ICD-10-CM | POA: Diagnosis not present

## 2021-08-05 DIAGNOSIS — E785 Hyperlipidemia, unspecified: Secondary | ICD-10-CM | POA: Insufficient documentation

## 2021-08-05 DIAGNOSIS — I480 Paroxysmal atrial fibrillation: Secondary | ICD-10-CM | POA: Diagnosis not present

## 2021-08-05 DIAGNOSIS — Z20822 Contact with and (suspected) exposure to covid-19: Secondary | ICD-10-CM | POA: Insufficient documentation

## 2021-08-05 DIAGNOSIS — I25110 Atherosclerotic heart disease of native coronary artery with unstable angina pectoris: Secondary | ICD-10-CM | POA: Diagnosis not present

## 2021-08-05 DIAGNOSIS — I10 Essential (primary) hypertension: Secondary | ICD-10-CM

## 2021-08-05 DIAGNOSIS — Z7902 Long term (current) use of antithrombotics/antiplatelets: Secondary | ICD-10-CM | POA: Insufficient documentation

## 2021-08-05 DIAGNOSIS — I5023 Acute on chronic systolic (congestive) heart failure: Secondary | ICD-10-CM | POA: Insufficient documentation

## 2021-08-05 LAB — TSH: TSH: 1.042 u[IU]/mL (ref 0.350–4.500)

## 2021-08-05 LAB — BASIC METABOLIC PANEL WITH GFR
Anion gap: 10 (ref 5–15)
BUN: 28 mg/dL — ABNORMAL HIGH (ref 8–23)
CO2: 22 mmol/L (ref 22–32)
Calcium: 9.1 mg/dL (ref 8.9–10.3)
Chloride: 103 mmol/L (ref 98–111)
Creatinine, Ser: 1.27 mg/dL — ABNORMAL HIGH (ref 0.61–1.24)
GFR, Estimated: 57 mL/min — ABNORMAL LOW
Glucose, Bld: 182 mg/dL — ABNORMAL HIGH (ref 70–99)
Potassium: 4.2 mmol/L (ref 3.5–5.1)
Sodium: 135 mmol/L (ref 135–145)

## 2021-08-05 LAB — BRAIN NATRIURETIC PEPTIDE: B Natriuretic Peptide: 514 pg/mL — ABNORMAL HIGH (ref 0.0–100.0)

## 2021-08-05 LAB — APTT: aPTT: 31 seconds (ref 24–36)

## 2021-08-05 LAB — CBC
HCT: 38.7 % — ABNORMAL LOW (ref 39.0–52.0)
Hemoglobin: 12 g/dL — ABNORMAL LOW (ref 13.0–17.0)
MCH: 30.8 pg (ref 26.0–34.0)
MCHC: 31 g/dL (ref 30.0–36.0)
MCV: 99.5 fL (ref 80.0–100.0)
Platelets: 257 K/uL (ref 150–400)
RBC: 3.89 MIL/uL — ABNORMAL LOW (ref 4.22–5.81)
RDW: 14.1 % (ref 11.5–15.5)
WBC: 11.4 K/uL — ABNORMAL HIGH (ref 4.0–10.5)
nRBC: 0 % (ref 0.0–0.2)

## 2021-08-05 LAB — CBG MONITORING, ED
Glucose-Capillary: 167 mg/dL — ABNORMAL HIGH (ref 70–99)
Glucose-Capillary: 136 mg/dL — ABNORMAL HIGH (ref 70–99)
Glucose-Capillary: 141 mg/dL — ABNORMAL HIGH (ref 70–99)

## 2021-08-05 LAB — RESP PANEL BY RT-PCR (FLU A&B, COVID) ARPGX2
Influenza A by PCR: NEGATIVE
Influenza B by PCR: NEGATIVE
SARS Coronavirus 2 by RT PCR: NEGATIVE

## 2021-08-05 LAB — PROTIME-INR
INR: 1 (ref 0.8–1.2)
Prothrombin Time: 12.8 seconds (ref 11.4–15.2)

## 2021-08-05 LAB — TROPONIN I (HIGH SENSITIVITY)
Troponin I (High Sensitivity): 22 ng/L — ABNORMAL HIGH
Troponin I (High Sensitivity): 56 ng/L — ABNORMAL HIGH (ref <18)

## 2021-08-05 LAB — HEPARIN LEVEL (UNFRACTIONATED): Heparin Unfractionated: 0.11 IU/mL — ABNORMAL LOW (ref 0.30–0.70)

## 2021-08-05 MED ORDER — NITROGLYCERIN 0.4 MG SL SUBL: 0.4000 mg | SUBLINGUAL_TABLET | SUBLINGUAL | Status: DC | PRN

## 2021-08-05 MED ORDER — INSULIN ASPART 100 UNIT/ML IJ SOLN
0.0000 [IU] | Freq: Three times a day (TID) | INTRAMUSCULAR | Status: DC
  Administered 2021-08-05: 2 [IU] via SUBCUTANEOUS
  Administered 2021-08-05: 1 [IU] via SUBCUTANEOUS
  Administered 2021-08-06: 3 [IU] via SUBCUTANEOUS
  Filled 2021-08-05 (×3): qty 1

## 2021-08-05 MED ORDER — HEPARIN BOLUS VIA INFUSION
4000.0000 [IU] | Freq: Once | INTRAVENOUS | Status: AC
  Administered 2021-08-05: 4000 [IU] via INTRAVENOUS
  Filled 2021-08-05: qty 4000

## 2021-08-05 MED ORDER — CLOPIDOGREL BISULFATE 75 MG PO TABS
75.0000 mg | ORAL_TABLET | Freq: Every day | ORAL | Status: DC
  Administered 2021-08-06: 75 mg via ORAL
  Filled 2021-08-05: qty 1

## 2021-08-05 MED ORDER — ONDANSETRON HCL 4 MG PO TABS: 4.0000 mg | ORAL_TABLET | Freq: Four times a day (QID) | ORAL | Status: DC | PRN

## 2021-08-05 MED ORDER — LATANOPROST 0.005 % OP SOLN
1.0000 [drp] | Freq: Every day | OPHTHALMIC | Status: DC
  Administered 2021-08-05: 1 [drp] via OPHTHALMIC
  Filled 2021-08-05: qty 2.5

## 2021-08-05 MED ORDER — ONDANSETRON HCL 4 MG/2ML IJ SOLN: 4.0000 mg | Freq: Four times a day (QID) | INTRAMUSCULAR | Status: DC | PRN

## 2021-08-05 MED ORDER — POLYVINYL ALCOHOL 1.4 % OP SOLN
1.0000 [drp] | Freq: Four times a day (QID) | OPHTHALMIC | Status: DC
  Administered 2021-08-05 – 2021-08-06 (×4): 1 [drp] via OPHTHALMIC
  Filled 2021-08-05: qty 15

## 2021-08-05 MED ORDER — HEPARIN (PORCINE) 25000 UT/250ML-% IV SOLN
2500.0000 [IU]/h | INTRAVENOUS | Status: DC
Start: 2021-08-05 — End: 2021-08-05

## 2021-08-05 MED ORDER — ATORVASTATIN CALCIUM 20 MG PO TABS
40.0000 mg | ORAL_TABLET | Freq: Every day | ORAL | Status: DC
  Administered 2021-08-05: 40 mg via ORAL
  Filled 2021-08-05: qty 2

## 2021-08-05 MED ORDER — BRIMONIDINE TARTRATE 0.2 % OP SOLN
1.0000 [drp] | Freq: Three times a day (TID) | OPHTHALMIC | Status: DC
  Administered 2021-08-05 – 2021-08-06 (×3): 1 [drp] via OPHTHALMIC
  Filled 2021-08-05: qty 5

## 2021-08-05 MED ORDER — VITAMIN B-12 1000 MCG PO TABS
1000.0000 ug | ORAL_TABLET | Freq: Every day | ORAL | Status: DC
Start: 2021-08-05 — End: 2021-08-06
  Administered 2021-08-05 – 2021-08-06 (×2): 1000 ug via ORAL
  Filled 2021-08-05 (×2): qty 1

## 2021-08-05 MED ORDER — BRINZOLAMIDE 1 % OP SUSP
1.0000 [drp] | Freq: Three times a day (TID) | OPHTHALMIC | Status: DC
  Administered 2021-08-05 – 2021-08-06 (×3): 1 [drp] via OPHTHALMIC
  Filled 2021-08-05: qty 10

## 2021-08-05 MED ORDER — MORPHINE SULFATE (PF) 2 MG/ML IV SOLN: 2.0000 mg | INTRAVENOUS | Status: DC | PRN

## 2021-08-05 MED ORDER — ASPIRIN EC 81 MG PO TBEC: 81.0000 mg | DELAYED_RELEASE_TABLET | Freq: Every day | ORAL | Status: DC

## 2021-08-05 MED ORDER — INSULIN ASPART 100 UNIT/ML IJ SOLN: 0.0000 [IU] | Freq: Every day | INTRAMUSCULAR | Status: DC

## 2021-08-05 MED ORDER — ACETAMINOPHEN 325 MG PO TABS: 650.0000 mg | ORAL_TABLET | Freq: Two times a day (BID) | ORAL | Status: DC | PRN

## 2021-08-05 MED ORDER — ISOSORBIDE MONONITRATE ER 60 MG PO TB24: 60.0000 mg | ORAL_TABLET | Freq: Every day | ORAL | Status: DC

## 2021-08-05 MED ORDER — ISOSORBIDE MONONITRATE ER 60 MG PO TB24
60.0000 mg | ORAL_TABLET | Freq: Every day | ORAL | Status: DC
  Administered 2021-08-05 – 2021-08-06 (×2): 60 mg via ORAL
  Filled 2021-08-05 (×2): qty 1

## 2021-08-05 MED ORDER — ATORVASTATIN CALCIUM 20 MG PO TABS: 40.0000 mg | ORAL_TABLET | Freq: Every day | ORAL | Status: DC

## 2021-08-05 MED ORDER — CARVEDILOL 6.25 MG PO TABS
6.2500 mg | ORAL_TABLET | Freq: Two times a day (BID) | ORAL | Status: DC
  Administered 2021-08-06: 6.25 mg via ORAL
  Filled 2021-08-05: qty 1

## 2021-08-05 MED ORDER — PANTOPRAZOLE SODIUM 40 MG PO TBEC
40.0000 mg | DELAYED_RELEASE_TABLET | Freq: Every day | ORAL | Status: DC
  Administered 2021-08-05 – 2021-08-06 (×2): 40 mg via ORAL
  Filled 2021-08-05 (×2): qty 1

## 2021-08-05 MED ORDER — TIMOLOL MALEATE 0.5 % OP SOLN
1.0000 [drp] | Freq: Every day | OPHTHALMIC | Status: DC
  Administered 2021-08-05 – 2021-08-06 (×2): 1 [drp] via OPHTHALMIC
  Filled 2021-08-05: qty 5

## 2021-08-05 MED ORDER — HEPARIN (PORCINE) 25000 UT/250ML-% IV SOLN
1300.0000 [IU]/h | INTRAVENOUS | Status: DC
Start: 2021-08-05 — End: 2021-08-06
  Administered 2021-08-05: 850 [IU]/h via INTRAVENOUS
  Administered 2021-08-06: 1150 [IU]/h via INTRAVENOUS
  Filled 2021-08-05 (×2): qty 250

## 2021-08-05 MED ORDER — FUROSEMIDE 40 MG PO TABS
40.0000 mg | ORAL_TABLET | Freq: Every day | ORAL | Status: DC
  Administered 2021-08-05 – 2021-08-06 (×2): 40 mg via ORAL
  Filled 2021-08-05 (×2): qty 1

## 2021-08-05 MED ORDER — CARVEDILOL 6.25 MG PO TABS
3.1250 mg | ORAL_TABLET | Freq: Two times a day (BID) | ORAL | Status: DC
  Administered 2021-08-05: 3.125 mg via ORAL
  Filled 2021-08-05: qty 1

## 2021-08-05 MED ORDER — EMPAGLIFLOZIN 25 MG PO TABS
25.0000 mg | ORAL_TABLET | Freq: Every morning | ORAL | Status: DC
  Administered 2021-08-05 – 2021-08-06 (×2): 25 mg via ORAL
  Filled 2021-08-05 (×2): qty 1

## 2021-08-05 MED ORDER — HEPARIN BOLUS VIA INFUSION
2500.0000 [IU] | Freq: Once | INTRAVENOUS | Status: AC
  Administered 2021-08-05: 2500 [IU] via INTRAVENOUS
  Filled 2021-08-05: qty 2500

## 2021-08-05 MED ORDER — NITROGLYCERIN 2 % TD OINT
0.5000 [in_us] | TOPICAL_OINTMENT | Freq: Once | TRANSDERMAL | Status: AC
  Administered 2021-08-05: 0.5 [in_us] via TOPICAL
  Filled 2021-08-05: qty 1

## 2021-08-05 NOTE — Consult Note (Signed)
ANTICOAGULATION CONSULT NOTE  Pharmacy Consult for IV Heparin Indication: chest pain/ACS and atrial fibrillation  Patient Measurements: Height: 6' (182.9 cm) Weight: 84.8 kg (187 lb) IBW/kg (Calculated) : 77.6 Heparin Dosing Weight: 84.8 kg  Labs: Recent Labs    08/05/21 0733 08/05/21 0905  HGB 12.0*  --   HCT 38.7*  --   PLT 257  --   APTT 31  --   LABPROT 12.8  --   INR 1.0  --   CREATININE 1.27*  --   TROPONINIHS 22* 56*    Estimated Creatinine Clearance: 50.9 mL/min (A) (by C-G formula based on SCr of 1.27 mg/dL (H)).   Medical History: Past Medical History:  Diagnosis Date   (HFpEF) heart failure with preserved ejection fraction (HCC)    Coronary artery disease    Diabetes mellitus without complication (HCC)    Hyperlipidemia    Hypertension     Medications:  No anticoagulation prior to admission per my chart review  Assessment: Patient is an 80 y/o M with medical history as above and including CAD who presented to the ED 12/11 with chest pain. Patient is now admitted with new-onset atrial fibrillation and unstable angina. Pharmacy consulted to assist with heparin monitoring for Afib / ACS.  Baseline aPTT and PT-INR are within normal limits. Baseline CBC acceptable.  Goal of Therapy:  Heparin level 0.3-0.7 units/ml Monitor platelets by anticoagulation protocol: Yes   Plan:  --IV heparin 4000 unit IV bolus followed by continuous infusion at 850 units/hr (initial dosing per EDP) --Check heparin level 8 hours after initiation of infusion --Daily CBC per protocol while on IV heparin  Tressie Ellis 08/05/2021,11:42 AM

## 2021-08-05 NOTE — ED Triage Notes (Signed)
Pt reports woke up with pain to his chest that was pressure and heavy like in nature. Pt took his nitro and got some relief but the pain came back. Pt reports no pain at this time due to being medicated by EMS. Pt reports had some OSB with the pain but no SOB at this time.

## 2021-08-05 NOTE — ED Provider Notes (Signed)
[facility] Emergency Department Provider Note   ____________________________________________   Event Date/Time   First MD Initiated Contact with Patient 08/05/21 [phone]     (approximate)  I have reviewed the triage vital signs and the nursing notes.   HISTORY  Chief Complaint Chest Pain    HPI Bradley [name] is a 80 y.o. male who had a heart attack in November of this year.  Reports he woke up at about 530 this morning with some chest tightness that felt just like when he had his heart attack.  He took a nitro and it went away.  He reports he got up and started shaving pain came back he took another nitro and it went away finish shaving went to put on his pants he took pain came back again he called 911 they gave him some more nitro into the tongue pain is now gone again patient is resting quietly in bed.  He reports he went to get his chest x-ray got short winded doing that.  He did not have any chest pain however.  The pain again he reports was heavy and tight in his anterior chest felt just like when he had his heart attack.  He did not report any radiation or nausea or other symptoms.         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 Authorizing Provider  acetaminophen (TYLENOL) 325 MG tablet Take 650 mg by mouth 2 (two) times daily as needed.    [provider]  aspirin EC 81 MG tablet Take 1 tablet by mouth daily.    [provider]  atorvastatin (LIPITOR) 80 MG tablet Take 40 mg by mouth daily.    [provider]  Brinzolamide-Brimonidine 1-0.2 % SUSP Place 1 drop into the left eye 3 (three) times daily.    [provider]  carvedilol (COREG) 3.125 MG tablet Take 1 tablet (3.125 mg total) by mouth 2 (two) times daily with a meal. 07/11/21   Esaw Grandchild A, DO  clopidogrel (PLAVIX) 75 MG tablet Take 1 tablet (75 mg total) by mouth daily with breakfast. 07/12/21   Pennie Banter, DO  clotrimazole (LOTRIMIN) 1 % cream Apply 1 application topically 2 (two) times daily.    [provider]  empagliflozin (JARDIANCE) 25 MG TABS tablet Take 12.5 mg by mouth every morning.    [provider]  furosemide (LASIX) 40 MG tablet Take 1 tablet (40 mg total) by mouth daily. 07/12/21   Esaw Grandchild A, DO  hydrocortisone cream 1 % Apply 1 application topically 2 (two) times daily.    [provider]  isosorbide mononitrate (IMDUR) 30 MG 24 hr tablet Take 0.5 tablets (15 mg total) by mouth daily. 07/12/21   Pennie Banter, DO  nitroGLYCERIN (NITROSTAT) 0.4 MG SL tablet Place 1 tablet (0.4 mg total) under the tongue every 5 (five) minutes x 3 doses as needed for chest pain. 07/11/21   Esaw Grandchild A, DO  pantoprazole (PROTONIX) 40 MG tablet Take 40 mg by mouth daily.    [provider]  Propylene Glycol 0.6 % SOLN  Place 1 drop into both eyes 4 (four) times daily.    [provider]  timolol (TIMOPTIC-XR) 0.5 % ophthalmic gel-forming Place 1 drop into both eyes daily. 10/29/20   [provider]  travoprost, benzalkonium, (TRAVATAN) 0.004 % ophthalmic solution Place 1 drop into both eyes nightly.    [provider]  vitamin B-12 (CYANOCOBALAMIN) 500 MCG tablet Take 1,000 mcg by mouth daily. Before breakfast    [provider]    Allergies Simvastatin  Family History  Problem Relation Age of Onset   Cancer Sister    Cancer Brother     Social History Social History   Tobacco Use   Smoking status: Former   Smokeless tobacco: Never  Substance Use Topics   Alcohol use: Never   Drug use: Never    Review of Systems  Constitutional: No fever/chills Eyes: No visual changes. ENT: No sore throat. Cardiovascular: Denies current chest pain. Respiratory: Denies current shortness of breath. Gastrointestinal: No abdominal pain.  No nausea, no vomiting.  No diarrhea.  No constipation. Genitourinary: Negative for dysuria. Musculoskeletal: Negative for back pain. Skin: Negative for rash. Neurological: Negative for headaches, focal weakness   ____________________________________________   PHYSICAL EXAM:  VITAL SIGNS: ED Triage Vitals  Enc Vitals Group     BP 08/05/21 0726 140/64     Pulse Rate 08/05/21 0726 (!) 50     Resp 08/05/21 0726 16     Temp 08/05/21 0726 97.8 F (36.6 C)     Temp Source 08/05/21 0726 Oral     SpO2 08/05/21 0726 91 %     Weight 08/05/21 0720 187 lb (84.8 kg)     Height 08/05/21 0720 6' (1.829 m)     Head Circumference --      Peak Flow --      Pain Score 08/05/21 0720 0     Pain Loc --      Pain Edu? --      Excl. in GC? --     Constitutional: Alert and oriented. Well appearing and in no acute distress. Eyes: Conjunctivae are normal. PER Head: Atraumatic. Nose: No congestion/rhinnorhea. Mouth/Throat: Mucous membranes are  moist.  Oropharynx non-erythematous. Neck: No stridor.  Cardiovascular: Normal rate, regular rhythm. Grossly normal heart sounds.  Good peripheral circulation. Respiratory: Normal respiratory effort.  No retractions. Lungs occasional scattered crackles Gastrointestinal: Soft and nontender. No distention. No abdominal bruits.  Musculoskeletal: No lower extremity tenderness nor edema.   Neurologic:  Normal speech and language. No gross focal neurologic deficits are appreciated.  Skin:  Skin is warm, dry and intact. No rash noted.   ____________________________________________   LABS (all labs ordered are listed, but only abnormal results are displayed)  Labs Reviewed  BASIC METABOLIC PANEL - Abnormal; Notable  for the following components:      Result Value   Glucose, Bld 182 (*)    BUN 28 (*)    Creatinine, Ser 1.27 (*)    GFR, Estimated 57 (*)    All other components within normal limits  CBC - Abnormal; Notable for the following components:   WBC 11.4 (*)    RBC 3.89 (*)    Hemoglobin 12.0 (*)    HCT 38.7 (*)    All other components within normal limits  TROPONIN I (HIGH SENSITIVITY) - Abnormal; Notable for the following components:   Troponin I (High Sensitivity) 22 (*)    All other components within normal limits  RESP PANEL BY RT-PCR (FLU A&B, COVID) ARPGX2  BRAIN NATRIURETIC PEPTIDE  PROTIME-INR  APTT   ____________________________________________  EKG  EKG read interpreted by me shows A. fib at a rate of 81 rightward axis there are inverted T waves in leads I, II, III and F as well as V5 and V6.  There is not really ST segment depression except for possibly in lead II and V4.  These changes appear to be a worsening of the ST segment depression seen on EKG in November which apparently was when patient was having his heart attack. ____________________________________________  RADIOLOGY Jill Poling, personally viewed and evaluated these images (plain  radiographs) as part of my medical decision making, as well as reviewing the written report by the radiologist.  ED MD interpretation: Chest x-ray reviewed by me shows possible posterior fusion and increased vascular markings could be CHF.  We will get a BNP to confirm.  Official radiology report(s): DG Chest 2 View  Result Date: 08/05/2021 CLINICAL DATA:  Chest pain, shortness of breath. EXAM: CHEST - 2 VIEW COMPARISON:  July 09, 2021. FINDINGS: Stable cardiomediastinal silhouette. Status post coronary bypass graft. Mild central pulmonary vascular congestion is noted. Mildly improved bilateral pulmonary edema is noted. Small bilateral pleural effusions are noted. Bony thorax is unremarkable. IMPRESSION: Mild central pulmonary vascular congestion is noted with probable improved bilateral pulmonary edema. Electronically Signed   By: Lupita Raider M.D.   On: 08/05/2021 07:58    ____________________________________________   PROCEDURES  Procedure(s) performed (including Critical Care):  Procedures   ____________________________________________   INITIAL IMPRESSION / ASSESSMENT AND PLAN / ED COURSE  [facility] but currently appears to be having return of his angina with multiple episodes today.  He does not appear to be stable for transport to the [place] at this time.  We will begin heparin.  He is already had Plavix and aspirin this morning.  He reports he takes Plavix daily and aspirin from EMS 4 baby aspirin's.  I will additionally give him some Nitropaste.  His chest x-ray appears to be showing increased vascular markings consistent with developing CHF.  Troponin and BNP are pending.    ----------------------------------------- 8:25 AM on 08/05/2021 ----------------------------------------- Initial troponin is 22 this is not markedly elevated.  Still this could be crescendo angina patient has not had this many episodes of chest pain in a row since his heart attack last  month.  We will plan on getting him in the hospital and stabilized.  We will consult hospitalist.         ____________________________________________   FINAL CLINICAL IMPRESSION(S) / ED DIAGNOSES  Final diagnoses:  Crescendo angina [facility])     ED Discharge Orders     None        Note:  This document was prepared using Dragon voice recognition software and may include unintentional dictation errors.    Arnaldo Natal, MD 08/05/21 [phone]

## 2021-08-05 NOTE — ED Notes (Signed)
Family called this RN to room, per family pt began having some SOB, no changes in CP. Pt O2 level 91% on RA, pt placed on 2L [place] and O2 increased to 96%. Pt was also repositioned in the bed. Pt states after supplemental O2 he feels better. Dr. Hilton Sinclair MD messaged, awaiting reply at this time.

## 2021-08-05 NOTE — ED Triage Notes (Addendum)
Pt in via EMS from home with c/o cp that woke him up around 0300 and was tight in nature. Pt took his own nitro which relieved it temporary but then returned so he called EMS. Pt has taken 324mg  of asa and 2 sprays of nitro and is currently pain free. Pt with hx of MI last month. #18 g to left AC, CBG 187

## 2021-08-05 NOTE — Consult Note (Signed)
ANTICOAGULATION CONSULT NOTE  Pharmacy Consult for IV Heparin Indication: chest pain/ACS and atrial fibrillation  Patient Measurements: Height: 6' (182.9 cm) Weight: 84.8 kg (187 lb) IBW/kg (Calculated) : 77.6 Heparin Dosing Weight: 84.8 kg  Labs: Recent Labs    08/05/21 0733 08/05/21 0905 08/05/21 1658  HGB 12.0*  --   --   HCT 38.7*  --   --   PLT 257  --   --   APTT 31  --   --   LABPROT 12.8  --   --   INR 1.0  --   --   HEPARINUNFRC  --   --  0.11*  CREATININE 1.27*  --   --   TROPONINIHS 22* 56*  --      Estimated Creatinine Clearance: 50.9 mL/min (A) (by C-G formula based on SCr of 1.27 mg/dL (H)).   Medical History: Past Medical History:  Diagnosis Date   (HFpEF) heart failure with preserved ejection fraction (HCC)    Coronary artery disease    Diabetes mellitus without complication (HCC)    Hyperlipidemia    Hypertension     Medications:  No anticoagulation prior to admission per my chart review  Assessment: Patient is an 80 y/o M with medical history as above and including CAD who presented to the ED 12/11 with chest pain. Patient is now admitted with new-onset atrial fibrillation and unstable angina. Pharmacy consulted to assist with heparin monitoring for Afib / ACS.  Baseline aPTT and PT-INR are within normal limits. Baseline CBC acceptable.  Date Time HL Rate/comment 12/11 1658 0.11 850 un/hr  Goal of Therapy:  Heparin level 0.3-0.7 units/ml Monitor platelets by anticoagulation protocol: Yes   Plan:  Heparin level 0.11, subtherapeutic  Ordered heparin 2500 unit IV bolus x 1 and increase infusion to 1150 units/hr  Check heparin level 8 hours after rate change Daily CBC per protocol while on IV heparin  Derrek Gu, PharmD 08/05/2021,5:22 PM

## 2021-08-05 NOTE — ED Notes (Signed)
Pt titrated to 3 L [place] for comfort

## 2021-08-05 NOTE — Consult Note (Signed)
Cardiology Consultation:   Patient ID: Bradley [name] MRN: [id number]; DOB: 09-02-1940  Admit date: 08/05/2021 Date of Consult: 08/05/2021  PCP:  Apolonio Schneiders, MD   [place] Providers Cardiologist:  Dr. Saunders Revel   Patient Profile:   Bradley [name] is a 79 y.o. male with a hx of coronary artery disease status post CABG in the setting of NSTEMI in 2016 at [facility] (LIMA-LAD, SVG-OM1, and SVG-RPDA), valvular heart disease (details uncertain), chronic diastolic heart failure, hypertension, hyperlipidemia, type 2 diabetes mellitus, and chronic kidney disease stage III  who is being seen 08/05/2021 for the evaluation of chest pain at the request of Dr. Leslye Peer.  History of Present Illness:   Bradley [name] is followed by Dr. Saunders Revel. He was admitted for NSTEMI 07/10/21 -07/11/21. He came in with chest pain improved with NTG. He was taken for heart cath which showed severe CAD with 90% distal LMCA stenosis, diffuse pLAD disease with functional mid vessel occlusion with competitive flow from LIMA-LAD, 99% proximal OM1 stenosis, and CTA of proximal RCA, widely patent LIMA-LAD and SVG-OM3, patent SVG-RPDA with 70% stenosis. Consider PCI to distal anastomosis SVG-RPDA for refractory symptoms. He was loaded with Plavix. Echo showed LVEF 30-35%, WMA, mild LVH, G2DD, severe HK of the LV wall, degenerative MV with mod MR, mild to mod AS, mild dilation of the ascending aorta, measuring 9mm. [facility]  The patient presented to the ER for chest pain. Chest pain woke him up lat night, had sharp pains in his chest. He took a NTG and a few minutes later the pain went away. He got up and went to the bathroom to shave and the chest pain came back. He tool another NTG (total of 4). He had associated SOB. No nasuea, vomiting. Pain similar to prior cardiac. He called EMS who brought him to th ER. HE took 4 baby ASA . Reports he had been having UA every time he exerted himself. He was taking lasix daily.    In the ED  BP 140/64, pulse 50, RR 16, afebrile, 91% O2. HS trop 22.>56. CXR with possible CHF.  BNP 514. EKG showed Afib HR 80s, RAD. Labs showed sodium 135, potassium 4.2, Scr 1.27, BUN 28, WBC 11.4, Hgb 12. Respiratory panel negative. Given nitropaste and started on IV heparin. Patient was admitted for further work-up.   Has dull chest pain, maybe a 2 or 3/10.    Past Medical History:  Diagnosis Date   (HFpEF) heart failure with preserved ejection fraction ([place])    Coronary artery disease    Diabetes mellitus without complication ([place])    Hyperlipidemia    Hypertension     Past Surgical History:  Procedure Laterality Date   CHOLECYSTECTOMY     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   [facility]: LIMA-LAD, SVG-OM1, and SVG-rPDA   LEFT HEART CATH AND CORS/GRAFTS ANGIOGRAPHY N/A 07/10/2021   Procedure: LEFT HEART CATH AND CORS/GRAFTS ANGIOGRAPHY;  Surgeon: Nelva Bush, MD;  Location: [place] [facility];  Service: Cardiovascular;  Laterality: N/A;     Home Medications:  Prior to Admission medications   Medication Sig Start Date End Date Taking? Authorizing Provider  acetaminophen (TYLENOL) 325 MG tablet Take 650 mg by mouth 2 (two) times daily as needed.    [provider]  aspirin EC 81 MG tablet Take 1 tablet by mouth daily.    [provider]  atorvastatin (LIPITOR) 80 MG tablet Take 40 mg by mouth daily.    [provider]  Brinzolamide-Brimonidine 1-0.2 % SUSP Place 1 drop into the left eye 3 (three) times daily.    [provider]  carvedilol (COREG) 3.125 MG tablet Take 1 tablet (3.125 mg total) by mouth 2 (two) times daily with a meal. 07/11/21   Nicole Kindred A, DO  clopidogrel (PLAVIX) 75 MG tablet Take 1 tablet (75 mg total) by mouth daily with breakfast. 07/12/21   Ezekiel Slocumb, DO  clotrimazole (LOTRIMIN) 1 % cream Apply 1 application topically 2 (two) times daily.    [provider]  empagliflozin (JARDIANCE) 25 MG TABS tablet  Take 12.5 mg by mouth every morning.    [provider]  furosemide (LASIX) 40 MG tablet Take 1 tablet (40 mg total) by mouth daily. 07/12/21   Nicole Kindred A, DO  hydrocortisone cream 1 % Apply 1 application topically 2 (two) times daily.    [provider]  isosorbide mononitrate (IMDUR) 30 MG 24 hr tablet Take 0.5 tablets (15 mg total) by mouth daily. 07/12/21   Ezekiel Slocumb, DO  nitroGLYCERIN (NITROSTAT) 0.4 MG SL tablet Place 1 tablet (0.4 mg total) under the tongue every 5 (five) minutes x 3 doses as needed for chest pain. 07/11/21   Nicole Kindred A, DO  pantoprazole (PROTONIX) 40 MG tablet Take 40 mg by mouth daily.    [provider]  Propylene Glycol 0.6 % SOLN Place 1 drop into both eyes 4 (four) times daily.    [provider]  timolol (TIMOPTIC-XR) 0.5 % ophthalmic gel-forming Place 1 drop into both eyes daily. 10/29/20   [provider]  travoprost, benzalkonium, (TRAVATAN) 0.004 % ophthalmic solution Place 1 drop into both eyes nightly.    [provider]  vitamin B-12 (CYANOCOBALAMIN) 500 MCG tablet Take 1,000 mcg by mouth daily. Before breakfast    [provider]    Inpatient Medications: Scheduled Meds:  heparin  4,000 Units Intravenous Once   Continuous Infusions:  heparin     PRN Meds:   Allergies:    Allergies  Allergen Reactions   Simvastatin     Social History:   Social History   Socioeconomic History   Marital status: Widowed    Spouse name: Not on file   Number of children: Not on file   Years of education: Not on file   Highest education level: Not on file  Occupational History   Not on file  Tobacco Use   Smoking status: Former   Smokeless tobacco: Never  Substance and Sexual Activity   Alcohol use: Never   Drug use: Never   Sexual activity: Not on file  Other Topics Concern   Not on file  Social History Narrative   Not on file   Social Determinants of Health    Financial Resource Strain: Not on file  Food Insecurity: Not on file  Transportation Needs: Not on file  Physical Activity: Not on file  Stress: Not on file  Social Connections: Not on file  Intimate Partner Violence: Not on file    Family History:    Family History  Problem Relation Age of Onset   Cancer Sister    Cancer Brother      ROS:  Please see the history of present illness.   All other ROS reviewed and negative.     Physical Exam/Data:   Vitals:   08/05/21 0720 08/05/21 0726 08/05/21 0745  BP:  140/64 125/73  Pulse:  (!) 50 62  Resp:  16  14  Temp:  97.8 F (36.6 C)   TempSrc:  Oral   SpO2:  91% 95%  Weight: 84.8 kg    Height: 6' (1.829 m)     No intake or output data in the 24 hours ending 08/05/21 0900 Last 3 Weights 08/05/2021 07/11/2021 07/10/2021  Weight (lbs) 187 lb 179 lb 0.2 oz 181 lb 9.6 oz  Weight (kg) 84.823 kg 81.2 kg 82.373 kg     Body mass index is 25.36 kg/m.  General:  Well nourished, well developed, in no acute distress HEENT: normal Neck: no JVD Vascular: No carotid bruits; Distal pulses 2+ bilaterally Cardiac:  normal S1, S2; Irreg Irreg; no murmur  Lungs:  clear to auscultation bilaterally, no wheezing, rhonchi or rales  Abd: soft, nontender, no hepatomegaly  Ext: no edema Musculoskeletal:  No deformities, BUE and BLE strength normal and equal Skin: warm and dry  Neuro:  CNs 2-12 intact, no focal abnormalities noted Psych:  Normal affect   EKG:  The EKG was personally reviewed and demonstrates:  Afibm 81bpm, RAD Telemetry:  Telemetry was personally reviewed and demonstrates:  Afib, HR 70-80s, PVCs  Relevant CV Studies:  Echo 07/10/21  1. Left ventricular ejection fraction, by estimation, is 30 to 35%. The  left ventricle has moderately decreased function. The left ventricle  demonstrates regional wall motion abnormalities (see scoring  diagram/findings for description). There is mild  left ventricular hypertrophy. Left  ventricular diastolic parameters are  consistent with Grade II diastolic dysfunction (pseudonormalization).  Elevated left atrial pressure. There is severe hypokinesis of the left  ventricular, entire anteroseptal wall,  anterior wall and anterolateral wall.   2. Right ventricular systolic function is normal. The right ventricular  size is normal.   3. The mitral valve is degenerative. Moderate mitral valve regurgitation.  No evidence of mitral stenosis.   4. The aortic valve has an indeterminant number of cusps. There is  moderate calcification of the aortic valve. There is severe thickening of  the aortic valve. Aortic valve regurgitation is mild to moderate. Mild to  moderate aortic valve stenosis.   5. There is mild dilatation of the ascending aorta, measuring 38 mm.   6. The inferior vena cava is normal in size with <50% respiratory  variability, suggesting right atrial pressure of 8 mmHg.   Cardiac cath 07/10/21 Conclusions: Severe native coronary artery disease with 90% distal LMCA stenosis, diffuse proximal LAD disease with functional mid vessel occlusion with competitive flow from LIMA-LAD, 99% proximal OM1 stenosis, and CTO of proximal RCA. Widely patent LIMA-LAD and SVG-OM3. Patent SVG-RPDA with 70% stenosis near distal anastomosis.  RPDA distal to SVG anastomosis is occluded (? culprit lesion for NSTEMI). Mildly elevated left ventricular filling pressure (LVEDP 15-20 mmHg). Mild-moderate aortic valve stenosis (peak-to-peak gradient ~20 mmHg).   Recommendations: Medical therapy.  Would only consider PCI to distal anastomosis of SVG-RPDA for refractory symptoms. Load with clopidogrel 300 mg daily followed by 75 mg daily thereafter for 12 months. Aggressive secondary prevention and initiation of goal-directed medical therapy for acute systolic heart failure.   Yvonne Kendall, MD [facility]  Antiplatelet/Anticoag Recommend uninterrupted dual antiplatelet therapy with  Aspirin 81mg  daily and Clopidogrel 75mg  daily for a minimum of 12 months (ACS-Class I recommendation).  Discharge Date In the absence of any other complications or medical issues, we expect the patient to be ready for discharge from a cath perspective on 07/11/2021.    Coronary Diagrams  Diagnostic Dominance: Right     Laboratory  Data:  High Sensitivity Troponin:   Recent Labs  Lab 07/09/21 2130 07/09/21 2331 07/10/21 0720 08/05/21 0733  TROPONINIHS 1,355* 14,103* >24,000* 22*     Chemistry Recent Labs  Lab 08/05/21 0733  NA 135  K 4.2  CL 103  CO2 22  GLUCOSE 182*  BUN 28*  CREATININE 1.27*  CALCIUM 9.1  GFRNONAA 57*  ANIONGAP 10    No results for input(s): PROT, ALBUMIN, AST, ALT, ALKPHOS, BILITOT in the last 168 hours. Lipids No results for input(s): CHOL, TRIG, HDL, LABVLDL, LDLCALC, CHOLHDL in the last 168 hours.  Hematology Recent Labs  Lab 08/05/21 0733  WBC 11.4*  RBC 3.89*  HGB 12.0*  HCT 38.7*  MCV 99.5  MCH 30.8  MCHC 31.0  RDW 14.1  PLT 257   Thyroid No results for input(s): TSH, FREET4 in the last 168 hours.  BNP Recent Labs  Lab 08/05/21 0733  BNP 514.0*    DDimer No results for input(s): DDIMER in the last 168 hours.   Radiology/Studies:  DG Chest 2 View  Result Date: 08/05/2021 CLINICAL DATA:  Chest pain, shortness of breath. EXAM: CHEST - 2 VIEW COMPARISON:  July 09, 2021. FINDINGS: Stable cardiomediastinal silhouette. Status post coronary bypass graft. Mild central pulmonary vascular congestion is noted. Mildly improved bilateral pulmonary edema is noted. Small bilateral pleural effusions are noted. Bony thorax is unremarkable. IMPRESSION: Mild central pulmonary vascular congestion is noted with probable improved bilateral pulmonary edema. Electronically Signed   By: Marijo Conception M.D.   On: 08/05/2021 07:58     Assessment and Plan:   Chest pain New Onset Aib - presents with sharp chest pain that woke patient up at  night resolved with NTG. Given 4baby ASA by EMS. EKG shows afib. HS trop mildly elevated.  - Continues to have dull chest pain - Rates controlled in afib. - IV heparin - CHADSVASC at least 5 (HTN, CAD, CHF, agex2). Would require long-term a/c with Eliquis - Has known low EF by echo in 06/2021 - Continue Coreg for rate control, titrate as needed - If he doesn't self-convert may need TEE/Cardioversion  3V CAD - reports h/o chest pain over the last month - HS trop 22>57 - IV heparin - continue ASA, plavix, statin, BB, Imdur - Recent LHC showed 3V  CAD with possible PCI target distal anastomosis of SVG-RPDA for refractory symptoms - Will discuss repeat ischemic eval with MD.  - Consider titration of Imdur for antianginal benefits.   Acute on chronic HFrEF - BNP elevated to 500 - CXR with mild central pulmonary vascular congestion with improved b/l pulmonary edema - Lasix 40mg  PTA - may need some IV diuresis - continue BB, Jardiance  - caution with GDMT with CKD - monitor kidney function, strict I/Os, and daily weights  CKD stage 3 - Scr 1.27, BUN 28  For questions or updates, please contact [facility] Please consult [email or web address] for contact info under    Signed, Hassan Blackshire Ninfa Meeker, PA-C  08/05/2021 9:00 AM

## 2021-08-05 NOTE — H&P (Signed)
[place] [facility] [place] NAME: Bradley [name]    MR#:  [id number]  DATE OF BIRTH:  06/13/1941  DATE OF ADMISSION:  08/05/2021  PRIMARY CARE PHYSICIAN: Apolonio Schneiders, MD   REQUESTING/REFERRING PHYSICIAN: Dr. Conni Slipper  CHIEF COMPLAINT:   Chief Complaint  Patient presents with  . Chest Pain    HISTORY OF PRESENT ILLNESS:  Bradley [name]  is a 80 y.o. male with a known history of CAD, essential hypertension, type 2 diabetes mellitus, CHF and hyperlipidemia.  With recent heart attack presents with recurrent chest pain.  The first episode awoke him from sleep at 3 AM lasted 5 minutes he took a nitroglycerin.  Described as sharp 9 out of 10 in intensity in the chest.  No sweating or nausea or vomiting.  When he walked to go to the bathroom and shave he developed further chest pain and took another nitro.  He was short of breath while walking.  Overall he took 4 nitroglycerin at home.  EMS gave him 4 aspirin to chew and 2 squirts of nitroglycerin and he is feeling better now without chest pain.  First troponin 22.  Last month the patient did have a heart attack with troponins going up above 24,000.  Cardiac cath was done and medical management was recommended at that time.  Hospitalist services were contacted for further evaluation.  PAST MEDICAL HISTORY:   Past Medical History:  Diagnosis Date  . (HFpEF) heart failure with preserved ejection fraction ([place])   . Coronary artery disease   . Diabetes mellitus without complication ([place])   . Hyperlipidemia   . Hypertension     PAST SURGICAL HISTORY:   Past Surgical History:  Procedure Laterality Date  . CATARACT EXTRACTION    . CHOLECYSTECTOMY    . 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   [facility]: LIMA-LAD, SVG-OM1, and SVG-rPDA  . LEFT HEART CATH AND CORS/GRAFTS ANGIOGRAPHY N/A 07/10/2021   Procedure: LEFT HEART CATH AND CORS/GRAFTS ANGIOGRAPHY;  Surgeon: Nelva Bush, MD;  Location: [place] [facility];  Service: Cardiovascular;  Laterality: N/A;    SOCIAL HISTORY:   Social History   Tobacco Use  . Smoking status: Former  . Smokeless tobacco: Never  Substance Use Topics  . Alcohol use: Never    FAMILY HISTORY:   Family History  Problem Relation Age of Onset  . CAD Mother   . Urinary tract infection Father   . Hip fracture Father   . Cancer Sister   . Cancer Brother     DRUG ALLERGIES:   Allergies  Allergen Reactions  . Simvastatin     REVIEW OF SYSTEMS:  CONSTITUTIONAL: No fever, no sweating.  Positive for fatigue.  EYES: No blurred or double vision.  EARS, NOSE, AND THROAT: No tinnitus or ear pain. No sore throat RESPIRATORY: No cough.positive for shortness of breath.no wheezing or hemoptysis.  CARDIOVASCULAR: Positive for chest pain, no orthopnea, edema.  GASTROINTESTINAL: No nausea, vomiting, diarrhea or abdominal pain. No blood in bowel movements GENITOURINARY: No dysuria, hematuria.  ENDOCRINE: No polyuria, nocturia,  HEMATOLOGY: No anemia, easy bruising or bleeding SKIN: No rash or lesion. MUSCULOSKELETAL: Positive for joint pain and arthritis.   NEUROLOGIC: No tingling, numbness, weakness.  PSYCHIATRY: No anxiety or depression.   MEDICATIONS AT HOME:   Prior to Admission medications   Medication Sig Start Date End Date Taking? Authorizing Provider  acetaminophen (TYLENOL) 325 MG tablet Take 650 mg by mouth 2 (two) times daily as  needed.    [provider]  aspirin EC 81 MG tablet Take 1 tablet by mouth daily.    [provider]  atorvastatin (LIPITOR) 80 MG tablet Take 40 mg by mouth daily.    [provider]  Brinzolamide-Brimonidine 1-0.2 % SUSP Place 1 drop into the left eye 3 (three) times daily.    [provider]  carvedilol (COREG) 3.125 MG tablet Take 1 tablet (3.125 mg total) by mouth 2 (two) times daily with a meal. 07/11/21   Esaw Grandchild A, DO  clopidogrel (PLAVIX) 75 MG tablet Take 1  tablet (75 mg total) by mouth daily with breakfast. 07/12/21   Pennie Banter, DO  clotrimazole (LOTRIMIN) 1 % cream Apply 1 application topically 2 (two) times daily.    [provider]  empagliflozin (JARDIANCE) 25 MG TABS tablet Take 12.5 mg by mouth every morning.    [provider]  furosemide (LASIX) 40 MG tablet Take 1 tablet (40 mg total) by mouth daily. 07/12/21   Esaw Grandchild A, DO  hydrocortisone cream 1 % Apply 1 application topically 2 (two) times daily.    [provider]  isosorbide mononitrate (IMDUR) 30 MG 24 hr tablet Take 0.5 tablets (15 mg total) by mouth daily. 07/12/21   Pennie Banter, DO  nitroGLYCERIN (NITROSTAT) 0.4 MG SL tablet Place 1 tablet (0.4 mg total) under the tongue every 5 (five) minutes x 3 doses as needed for chest pain. 07/11/21   Esaw Grandchild A, DO  pantoprazole (PROTONIX) 40 MG tablet Take 40 mg by mouth daily.    [provider]  Propylene Glycol 0.6 % SOLN Place 1 drop into both eyes 4 (four) times daily.    [provider]  timolol (TIMOPTIC-XR) 0.5 % ophthalmic gel-forming Place 1 drop into both eyes daily. 10/29/20   [provider]  travoprost, benzalkonium, (TRAVATAN) 0.004 % ophthalmic solution Place 1 drop into both eyes nightly.    [provider]  vitamin B-12 (CYANOCOBALAMIN) 500 MCG tablet Take 1,000 mcg by mouth daily. Before breakfast    [provider]      VITAL SIGNS:  Blood pressure 125/73, pulse 62, temperature 97.8 F (36.6 C), temperature source Oral, resp. rate 14, height 6' (1.829 m), weight 84.8 kg, SpO2 95 %.  PHYSICAL EXAMINATION:  GENERAL:  80 y.o.-year-old patient lying in the bed with no acute distress.  EYES: Pupils equal, round, reactive to light and accommodation. No scleral icterus.  HEENT: Head atraumatic, normocephalic. Oropharynx and nasopharynx clear.  NECK:  Supple, no jugular venous distention. No thyroid enlargement, no tenderness.   LUNGS: Normal breath sounds bilaterally, no wheezing, rales,rhonchi or crepitation. No use of accessory muscles of respiration.  CARDIOVASCULAR: S1, S2 normal. No murmurs, rubs, or gallops.  ABDOMEN: Soft, nontender, nondistended.  EXTREMITIES: No pedal edema, cyanosis, or clubbing.  NEUROLOGIC: Cranial nerves II through XII are intact. Muscle strength 5/5 in all extremities. Sensation intact. Gait not checked.  PSYCHIATRIC: The patient is alert and oriented x 3.  SKIN: No rash, lesion, or ulcer.   LABORATORY PANEL:   CBC Recent Labs  Lab 08/05/21 0733  WBC 11.4*  HGB 12.0*  HCT 38.7*  PLT 257   ------------------------------------------------------------------------------------------------------------------  Chemistries  Recent Labs  Lab 08/05/21 0733  NA 135  K 4.2  CL 103  CO2 22  GLUCOSE 182*  BUN 28*  CREATININE 1.27*  CALCIUM 9.1   ------------------------------------------------------------------------------------------------------------------  Cardiac Enzymes First troponin 22  RADIOLOGY:  DG  Chest 2 View  Result Date: 08/05/2021 CLINICAL DATA:  Chest pain, shortness of breath. EXAM: CHEST - 2 VIEW COMPARISON:  July 09, 2021. FINDINGS: Stable cardiomediastinal silhouette. Status post coronary bypass graft. Mild central pulmonary vascular congestion is noted. Mildly improved bilateral pulmonary edema is noted. Small bilateral pleural effusions are noted. Bony thorax is unremarkable. IMPRESSION: Mild central pulmonary vascular congestion is noted with probable improved bilateral pulmonary edema. Electronically Signed   By: Marijo Conception M.D.   On: 08/05/2021 07:58    EKG:   EKG looks like atrial fibrillation 81 bpm.  Flipped T waves laterally On the monitor did look like sinus rhythm.  IMPRESSION AND PLAN:   1.  Unstable angina with history of CAD with recent myocardial infarction last month.  Received aspirin by EMS.  Continue Plavix.  Heparin drip  ordered.  Cardiology consultation.  Awaiting second troponin.  Continue Coreg and Lipitor.  Check a lipid profile tomorrow morning. 2.  Type 2 diabetes mellitus with CKD stage IIIa.  On Jardiance.  Continue to monitor. 3.  Brief atrial fibrillation seen on EKG.  Look like sinus rhythm on monitor.  On heparin drip for now.  On Coreg. 4.  Chronic systolic congestive heart failure.  Last echo showed an EF of 30 to 35% but likely decreased with recent MI..  No signs of heart failure currently.  Hold Lasix just in case cardiac cath planned. 5.  Hyperlipidemia unspecified continue Lipitor.  Check a lipid profile tomorrow morning 6.  Essential hypertension on Coreg 7.  Glaucoma continue numerous eyedrops   All the records are reviewed and case discussed with ED provider. Management plans discussed with the patient, family and they are in agreement.  Since I was called before the second troponin was done I will start off as observation status.  CODE STATUS: DNR  TOTAL TIME TAKING CARE OF THIS PATIENT: 50 minutes.    Loletha Grayer M.D on 08/05/2021 at 9:14 AM   [facility]  CC: Primary care physician; Apolonio Schneiders, MD

## 2021-08-06 DIAGNOSIS — I209 Angina pectoris, unspecified: Secondary | ICD-10-CM

## 2021-08-06 DIAGNOSIS — R079 Chest pain, unspecified: Secondary | ICD-10-CM | POA: Diagnosis not present

## 2021-08-06 DIAGNOSIS — E1122 Type 2 diabetes mellitus with diabetic chronic kidney disease: Secondary | ICD-10-CM | POA: Diagnosis not present

## 2021-08-06 DIAGNOSIS — I5022 Chronic systolic (congestive) heart failure: Secondary | ICD-10-CM | POA: Diagnosis not present

## 2021-08-06 DIAGNOSIS — I480 Paroxysmal atrial fibrillation: Secondary | ICD-10-CM | POA: Diagnosis not present

## 2021-08-06 LAB — LIPID PANEL
Cholesterol: 81 mg/dL (ref 0–200)
HDL: 33 mg/dL — ABNORMAL LOW (ref >40)
LDL Cholesterol: 31 mg/dL (ref 0–99)
Total CHOL/HDL Ratio: 2.5 RATIO
Triglycerides: 87 mg/dL (ref <150)
VLDL: 17 mg/dL (ref 0–40)

## 2021-08-06 LAB — CBC
HCT: 33.8 % — ABNORMAL LOW (ref 39.0–52.0)
Hemoglobin: 11 g/dL — ABNORMAL LOW (ref 13.0–17.0)
MCH: 31.3 pg (ref 26.0–34.0)
MCHC: 32.5 g/dL (ref 30.0–36.0)
MCV: 96.3 fL (ref 80.0–100.0)
Platelets: 212 10*3/uL (ref 150–400)
RBC: 3.51 MIL/uL — ABNORMAL LOW (ref 4.22–5.81)
RDW: 14.4 % (ref 11.5–15.5)
WBC: 8.7 10*3/uL (ref 4.0–10.5)
nRBC: 0 % (ref 0.0–0.2)

## 2021-08-06 LAB — BASIC METABOLIC PANEL
Anion gap: 9 (ref 5–15)
BUN: 30 mg/dL — ABNORMAL HIGH (ref 8–23)
CO2: 22 mmol/L (ref 22–32)
Calcium: 8.8 mg/dL — ABNORMAL LOW (ref 8.9–10.3)
Chloride: 105 mmol/L (ref 98–111)
Creatinine, Ser: 1.44 mg/dL — ABNORMAL HIGH (ref 0.61–1.24)
GFR, Estimated: 49 mL/min — ABNORMAL LOW (ref >60)
Glucose, Bld: 108 mg/dL — ABNORMAL HIGH (ref 70–99)
Potassium: 3.8 mmol/L (ref 3.5–5.1)
Sodium: 136 mmol/L (ref 135–145)

## 2021-08-06 LAB — HEPARIN LEVEL (UNFRACTIONATED): Heparin Unfractionated: 0.21 IU/mL — ABNORMAL LOW (ref 0.30–0.70)

## 2021-08-06 LAB — CBG MONITORING, ED
Glucose-Capillary: 97 mg/dL (ref 70–99)
Glucose-Capillary: 212 mg/dL — ABNORMAL HIGH (ref 70–99)

## 2021-08-06 MED ORDER — HEPARIN BOLUS VIA INFUSION
1250.0000 [IU] | Freq: Once | INTRAVENOUS | Status: AC
  Administered 2021-08-06: 1250 [IU] via INTRAVENOUS
  Filled 2021-08-06: qty 1250

## 2021-08-06 MED ORDER — CARVEDILOL 6.25 MG PO TABS: 6.2500 mg | ORAL_TABLET | Freq: Two times a day (BID) | ORAL | 0 refills | Status: DC

## 2021-08-06 MED ORDER — ISOSORBIDE MONONITRATE ER 60 MG PO TB24: 60.0000 mg | ORAL_TABLET | Freq: Every day | ORAL | 0 refills | Status: DC

## 2021-08-06 MED ORDER — APIXABAN 5 MG PO TABS: 5.0000 mg | ORAL_TABLET | Freq: Two times a day (BID) | ORAL | 0 refills | Status: DC

## 2021-08-06 MED ORDER — APIXABAN 5 MG PO TABS
5.0000 mg | ORAL_TABLET | Freq: Two times a day (BID) | ORAL | Status: DC
  Administered 2021-08-06: 5 mg via ORAL
  Filled 2021-08-06: qty 1

## 2021-08-06 NOTE — Consult Note (Signed)
ANTICOAGULATION CONSULT NOTE  Pharmacy Consult for IV Heparin Indication: chest pain/ACS and atrial fibrillation  Patient Measurements: Height: 6' (182.9 cm) Weight: 84.8 kg (187 lb) IBW/kg (Calculated) : 77.6 Heparin Dosing Weight: 84.8 kg  Labs: Recent Labs    08/05/21 0733 08/05/21 0905 08/05/21 1658 08/06/21 0226  HGB 12.0*  --   --   --   HCT 38.7*  --   --   --   PLT 257  --   --   --   APTT 31  --   --   --   LABPROT 12.8  --   --   --   INR 1.0  --   --   --   HEPARINUNFRC  --   --  0.11* 0.21*  CREATININE 1.27*  --   --   --   TROPONINIHS 22* 56*  --   --      Estimated Creatinine Clearance: 50.9 mL/min (A) (by C-G formula based on SCr of 1.27 mg/dL (H)).   Medical History: Past Medical History:  Diagnosis Date   (HFpEF) heart failure with preserved ejection fraction ([facility])    Coronary artery disease    Diabetes mellitus without complication ([facility])    Hyperlipidemia    Hypertension     Medications:  No anticoagulation prior to admission per my chart review  Assessment: Patient is an 80 y/o M with medical history as above and including CAD who presented to the ED 12/11 with chest pain. Patient is now admitted with new-onset atrial fibrillation and unstable angina. Pharmacy consulted to assist with heparin monitoring for Afib / ACS.  Baseline aPTT and PT-INR are within normal limits. Baseline CBC acceptable.  Date Time HL Rate/comment 12/11 1658 0.11 850 un/hr 12/12   0226    0.21    1150 units/hr  Goal of Therapy:  Heparin level 0.3-0.7 units/ml Monitor platelets by anticoagulation protocol: Yes   Plan:  12/12:  HL @ 0226 = 0.21, SUBtherapeutic Will order heparin 1250 units IV X 1 bolus and increase drip rate to 1300 units/hr.  Will recheck HL 8 hrs after rate change.   Chanta Bauers D, PharmD 08/06/2021,3:43 AM

## 2021-08-06 NOTE — ED Notes (Signed)
Patient being moved to cpod

## 2021-08-06 NOTE — Progress Notes (Signed)
Progress Note  Patient Name: Bradley [name] Date of Encounter: 08/06/2021  Primary Cardiologist: [facility]  Subjective   Feels well this morning.  No recurrence of chest pain or dyspnea.  Remains in atrial fibrillation but asymptomatic.  Inpatient Medications    Scheduled Meds:  aspirin EC  81 mg Oral Daily   atorvastatin  40 mg Oral QHS   brinzolamide  1 drop Both Eyes TID   And   brimonidine  1 drop Both Eyes TID   carvedilol  6.25 mg Oral BID WC   clopidogrel  75 mg Oral Q breakfast   empagliflozin  25 mg Oral q morning   furosemide  40 mg Oral Daily   insulin aspart  0-5 Units Subcutaneous QHS   insulin aspart  0-9 Units Subcutaneous TID WC   isosorbide mononitrate  60 mg Oral Daily   latanoprost  1 drop Both Eyes QHS   pantoprazole  40 mg Oral Daily   polyvinyl alcohol  1 drop Both Eyes QID   timolol  1 drop Both Eyes Daily   vitamin B-12  1,000 mcg Oral Daily   Continuous Infusions:  heparin 1,300 Units/hr (08/06/21 0345)   PRN Meds: acetaminophen, morphine injection, nitroGLYCERIN, ondansetron **OR** ondansetron (ZOFRAN) IV   Vital Signs    Vitals:   08/06/21 0200 08/06/21 0400 08/06/21 0430 08/06/21 0500  BP: (!) 106/49 (!) 105/49 115/62 (!) 106/91  Pulse: 69 71 66 72  Resp: 11 10 10 19   Temp:      TempSrc:      SpO2: 100% 97% 97% 98%  Weight:      Height:       No intake or output data in the 24 hours ending 08/06/21 0834 Filed Weights   08/05/21 0720  Weight: 84.8 kg    Physical Exam   GEN: Well nourished, well developed, in no acute distress.  HEENT: Grossly normal.  Neck: Supple, no JVD, carotid bruits, or masses. Cardiac: Irregularly irregular, 2/6 systolic murmur heard throughout, no rubs or gallops. No clubbing, cyanosis, edema.  Radials 2+, DP/PT 1+ and equal bilaterally.  Respiratory:  Respirations regular and unlabored, initially had bibasilar crackles that cleared with deep breathing. GI: Soft, nontender, nondistended, BS +  x 4. MS: no deformity or atrophy. Skin: warm and dry, no rash. Neuro:  Strength and sensation are intact. Psych: AAOx3.  Normal affect.  Labs    Chemistry Recent Labs  Lab 08/05/21 0733 08/06/21 0611  NA 135 136  K 4.2 3.8  CL 103 105  CO2 22 22  GLUCOSE 182* 108*  BUN 28* 30*  CREATININE 1.27* 1.44*  CALCIUM 9.1 8.8*  GFRNONAA 57* 49*  ANIONGAP 10 9     Hematology Recent Labs  Lab 08/05/21 0733 08/06/21 0611  WBC 11.4* 8.7  RBC 3.89* 3.51*  HGB 12.0* 11.0*  HCT 38.7* 33.8*  MCV 99.5 96.3  MCH 30.8 31.3  MCHC 31.0 32.5  RDW 14.1 14.4  PLT 257 212    Cardiac Enzymes  Recent Labs  Lab 07/09/21 2130 07/09/21 2331 07/10/21 0720 08/05/21 0733 08/05/21 0905  TROPONINIHS 1,355* 14,103* >24,000* 22* 56*      BNP Recent Labs  Lab 08/05/21 0733  BNP 514.0*    Lipids  Lab Results  Component Value Date   CHOL 81 08/06/2021   HDL 33 (L) 08/06/2021   LDLCALC 31 08/06/2021   TRIG 87 08/06/2021   CHOLHDL 2.5 08/06/2021    HbA1c  Lab Results  Component Value Date   HGBA1C 8.6 (H) 07/10/2021    Radiology    DG Chest 2 View  Result Date: 08/05/2021 CLINICAL DATA:  Chest pain, shortness of breath. EXAM: CHEST - 2 VIEW COMPARISON:  July 09, 2021. FINDINGS: Stable cardiomediastinal silhouette. Status post coronary bypass graft. Mild central pulmonary vascular congestion is noted. Mildly improved bilateral pulmonary edema is noted. Small bilateral pleural effusions are noted. Bony thorax is unremarkable. IMPRESSION: Mild central pulmonary vascular congestion is noted with probable improved bilateral pulmonary edema. Electronically Signed   By: Marijo Conception M.D.   On: 08/05/2021 07:58    Telemetry    Atrial fibrillation 70s to 80s- Personally Reviewed  Cardiac Studies   Cardiac Catheterization  11.15.2022  Conclusions: Severe native coronary artery disease with 90% distal LMCA stenosis, diffuse proximal LAD disease with functional mid vessel  occlusion with competitive flow from LIMA-LAD, 99% proximal OM1 stenosis, and CTO of proximal RCA. Widely patent LIMA-LAD and SVG-OM3. Patent SVG-RPDA with 70% stenosis near distal anastomosis.  RPDA distal to SVG anastomosis is occluded (? culprit lesion for NSTEMI). Mildly elevated left ventricular filling pressure (LVEDP 15-20 mmHg). Mild-moderate aortic valve stenosis (peak-to-peak gradient ~20 mmHg).   Recommendations: Medical therapy.  Would only consider PCI to distal anastomosis of SVG-RPDA for refractory symptoms. Load with clopidogrel 300 mg daily followed by 75 mg daily thereafter for 12 months. Aggressive secondary prevention and initiation of goal-directed medical therapy for acute systolic heart failure. _____________  2D Echocardiogram 11.15.2022   1. Left ventricular ejection fraction, by estimation, is 30 to 35%. The  left ventricle has moderately decreased function. The left ventricle  demonstrates regional wall motion abnormalities (see scoring  diagram/findings for description). There is mild  left ventricular hypertrophy. Left ventricular diastolic parameters are  consistent with Grade II diastolic dysfunction (pseudonormalization).  Elevated left atrial pressure. There is severe hypokinesis of the left  ventricular, entire anteroseptal wall,  anterior wall and anterolateral wall.   2. Right ventricular systolic function is normal. The right ventricular  size is normal.   3. The mitral valve is degenerative. Moderate mitral valve regurgitation.  No evidence of mitral stenosis.   4. The aortic valve has an indeterminant number of cusps. There is  moderate calcification of the aortic valve. There is severe thickening of  the aortic valve. Aortic valve regurgitation is mild to moderate. Mild to  moderate aortic valve stenosis.   5. There is mild dilatation of the ascending aorta, measuring 38 mm.   6. The inferior vena cava is normal in size with <50% respiratory   variability, suggesting right atrial pressure of 8 mmHg.   Patient Profile      80 y.o. male with a hx of coronary artery disease status post CABG in the setting of NSTEMI in 2016 at [facility] (LIMA-LAD, SVG-OM1, and SVG-RPDA) - s/p NSTEMI 06/2021 (med rx), valvular heart disease (details uncertain), chronic diastolic heart failure, hypertension, hyperlipidemia, type 2 diabetes mellitus, and chronic kidney disease stage III, who was admitted 08/05/2021 w/ stuttering c/p and mild HsTrop elevation (22  56).  Assessment & Plan    1.  Unstable Angina/CAD: Status post prior CABG x3 at [facility] in 2016 with recent admission for non-STEMI, at which time repeat troponin greater than 24,000.  Catheterization on that admission showed 3 of 3 patent grafts that was noted that the vein graft to the PDA had a 70% stenosis at the anastomosis with occlusive disease in the PDA.  Aggressive medical therapy  was recommended.  He represented December 11 with stuttering chest pain requiring several nitroglycerin.  In the ER, he was found to be in A. fib with rates in the 80s and troponin rose from 22-50.  He has had no recurrence of chest discomfort since admission.  Isosorbide dose was titrated to 60 mg daily yesterday.  Cath films reviewed by Dr. Fletcher Anon  no good targets for intervention with recommendation for ongoing medical therapy.  Continue beta-blocker, Plavix, nitrate, and statin.  Consider further titration of nitrate versus Ranexa in the future for recurrent angina.  We will discontinue aspirin given A. fib and requirement for Eliquis.  Ambulate.  2.  New onset atrial fibrillation: Patient currently asymptomatic and well rate controlled.  Duration unclear.  We will transition from heparin to Eliquis.  Discussed the diagnosis of A. fib and long-term management (anticoagulation, rhythm versus rate control).  Continue beta-blocker.  His outpatient cardiologist can consider cardioversion after 4 weeks of oral anticoagulation if  appropriate.  We will have to follow creatinine closely as if kidney disease progresses, his Eliquis dose will likely need to be reduced.  3.  Ischemic cardiomyopathy/chronic HFrEF: EF 30 to 35% by echo in November with grade 2 diastolic dysfunction.  Chest x-ray with vascular congestion.  Euvolemic on exam this morning and feels well.  He is on beta-blocker, nitrate, and empagliflozin therapy.  Previous notes indicate that acei/arb/arni/mra avoided secondary to renal dysfunction.  Creatinine up slightly this morning at 1.44.  Pressure too soft for addition of hydralazine.  4.  Essential hypertension: Stable on beta-blocker, nitrate, and diuretic therapy.  5.  Hyperlipidemia: LDL of 31.  Continue statin therapy.  6.  Moderate mitral regurgitation: Noted on echo in November.  Follow in outpatient setting.  7.  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  8.  Stage III chronic kidney disease: Creatinine up slightly this morning from 1.27-1.44 though, this is generally within his range.  Continue current dose of diuretic.  Follow-up as outpatient.  Signed, Murray Hodgkins, NP  08/06/2021, 8:34 AM    For questions or updates, please contact   Please consult [email or web address] for contact info under Cardiology/STEMI.

## 2021-08-06 NOTE — TOC Initial Note (Signed)
Transition of Care [facility]) - Initial/Assessment Note    Patient Details  Name: Bradley [name] MRN: [id number] Date of Birth: 07-01-1941  Transition of Care [facility] CM/SW Contact:    Shelbie Hutching, RN Phone Number: 08/06/2021, 11:41 AM  Clinical Narrative:                 Patient is under observation for chest pain.  Patient gets all his care at the [facility] in [place].  Patient does have Medicare but just Part A.  VA covers all of his medical expenses.  Reviewed MOON with patient but VA is his primary insurance.   RNCM met with patient and his niece at the bedside.  Patient is from home where he lives alone, he is independent and drives.   He will be discharged home on Eliquis, RNCM provided patient with 30 day free Eliquis Coupon.  He gets his prescriptions from the [facility] [place] and delivered in the mail, he will get his initial prescription from [facility].    Expected Discharge Plan: Home/Self Care Barriers to Discharge: Continued Medical Work up   Patient Goals and CMS Choice Patient states their goals for this hospitalization and ongoing recovery are:: Patient hopes to go home today      Expected Discharge Plan and Services Expected Discharge Plan: Home/Self Care   Discharge Planning Services: CM Consult   Living arrangements for the past 2 months: Single Family Home Expected Discharge Date: 08/06/21               DME Arranged: N/A DME Agency: NA       HH Arranged: NA          Prior Living Arrangements/Services Living arrangements for the past 2 months: Single Family Home Lives with:: Self Patient language and need for interpreter reviewed:: Yes Do you feel safe going back to the place where you live?: Yes      Need for Family Participation in Patient Care: Yes (Comment) Care giver support system in place?: Yes (comment) (niece and sister)   Criminal Activity/Legal Involvement Pertinent to Current Situation/Hospitalization: No - Comment as needed  Activities of Daily Living       Permission Sought/Granted Permission sought to share information with : Family Supports, Case Optician, dispensing granted to share information with : Yes, Verbal Permission Granted  Share Information with NAME: Glenard Haring  Permission granted to share info w AGENCY: [place]  Permission granted to share info w Relationship: niece- present at the bedside  Permission granted to share info w Contact Information: [phone]  Emotional Assessment Appearance:: Appears stated age Attitude/Demeanor/Rapport: Engaged Affect (typically observed): Accepting Orientation: : Oriented to Self, Oriented to Place, Oriented to  Time, Oriented to Situation Alcohol / Substance Use: Not Applicable Psych Involvement: No (comment)  Admission diagnosis:  Unstable angina (HCC) [I20.0] Patient Active Problem List   Diagnosis Date Noted   Unstable angina ([place]) 08/05/2021   Paroxysmal atrial fibrillation ([place]) 08/05/2021   CAD (coronary artery disease) 08/05/2021   Pulmonary edema 67/61/9509   Acute systolic CHF (congestive heart failure) ([place]) 08/05/2021   Chest pain with moderate risk for cardiac etiology 08/05/2021   CKD stage 3 due to type 2 diabetes mellitus (HCC)    AF (paroxysmal atrial fibrillation) (HCC)    Chronic systolic CHF (congestive heart failure) ([place])    Hyperlipidemia    Glaucoma of both eyes    Ischemic cardiomyopathy    Chronic low back pain 07/09/2021   NSTEMI (non-ST elevated myocardial infarction) ([place])  07/09/2021   Acute on chronic diastolic CHF (congestive heart failure) ([place]) 07/09/2021   Hyperglycemia due to type 2 diabetes mellitus ([place]) 07/09/2021   S/P CABG (coronary artery bypass graft) 07/09/2021   Essential hypertension 07/09/2021   Syncope 03/13/2018   PCP:  Apolonio Schneiders, MD Pharmacy:   [id number] [place], [place] [id number]: [phone]: [address]Phone: [phone]: [phone]    Social Determinants of Health (SDOH) Interventions    Readmission Risk Interventions No flowsheet data found.

## 2021-08-06 NOTE — Progress Notes (Signed)
Provided presence, support and prayer for patient with his niece bedside. We share a lengthy yet mutually enjoyable conversation together about our shared faith.

## 2021-08-06 NOTE — Discharge Summary (Signed)
[facility] [place] [facility]   PATIENT NAME: Bradley [name]    MR#:  [id number]  DATE OF BIRTH:  [birth year]  DATE OF ADMISSION:  08/05/2021 ADMITTING PHYSICIAN: Alford Highland, MD  DATE OF DISCHARGE: 08/06/2021  1:39 PM  PRIMARY CARE PHYSICIAN: Aldona Bar, MD    ADMISSION DIAGNOSIS:  Unstable angina (HCC) [I20.0]  DISCHARGE DIAGNOSIS:  Principal Problem:   Paroxysmal atrial fibrillation (HCC) Active Problems:   S/P CABG (coronary artery bypass graft)   Essential hypertension   CAD (coronary artery disease)   Pulmonary edema   Acute systolic CHF (congestive heart failure) (HCC)   Chest pain with moderate risk for cardiac etiology   SECONDARY DIAGNOSIS:   Past Medical History:  Diagnosis Date  . (HFpEF) heart failure with preserved ejection fraction (HCC)   . Coronary artery disease   . Diabetes mellitus without complication (HCC)   . Hyperlipidemia   . Hypertension     HOSPITAL COURSE:   New atrial fibrillation.  Initially the patient was started on heparin drip and switched over to Eliquis.  Benefits and risks of blood thinner explained to patient and niece.  On increased dose of Coreg. Angina with CAD.  Recent NSTEMI last month.  Cardiology reviewed the cardiac cath and does not think we can do anything intervention wise and recommended medical management.  Imdur titrated up to 60 mg.  Patient on Plavix, Coreg, Lipitor.  If further chest pain as outpatient can consider Ranexa.  Aspirin discontinued because Eliquis started. 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  Continue Jardiance Chronic systolic congestive heart failure.  Last echo showed an EF of 30 to 35% likely decreased EF with recent MI.  No signs of heart failure currently.  Continue Lasix 40 mg daily Hyperlipidemia unspecified on Lipitor.  LDL 31 Essential hypertension on Coreg Glaucoma continue numerous eyedrops  DISCHARGE CONDITIONS:    Satisfactory  CONSULTS OBTAINED:  Treatment Team:  Antonieta Iba, MD  DRUG ALLERGIES:   Allergies  Allergen Reactions  . Simvastatin     DISCHARGE MEDICATIONS:   Allergies as of 08/06/2021       Reactions   Simvastatin         Medication List     STOP taking these medications    aspirin EC 81 MG tablet   clotrimazole 1 % cream Commonly known as: LOTRIMIN   hydrocortisone cream 1 %       TAKE these medications    acetaminophen 325 MG tablet Commonly known as: TYLENOL Take 650-1,300 mg by mouth 2 (two) times daily as needed.   apixaban 5 MG Tabs tablet Commonly known as: ELIQUIS Take 1 tablet (5 mg total) by mouth 2 (two) times daily.   atorvastatin 80 MG tablet Commonly known as: LIPITOR Take 40 mg by mouth daily.   Brinzolamide-Brimonidine 1-0.2 % Susp Place 1 drop into the left eye 3 (three) times daily.   carvedilol 6.25 MG tablet Commonly known as: COREG Take 1 tablet (6.25 mg total) by mouth 2 (two) times daily with a meal. What changed:  medication strength how much to take   clopidogrel 75 MG tablet Commonly known as: PLAVIX Take 1 tablet (75 mg total) by mouth daily with breakfast.   empagliflozin 25 MG Tabs tablet Commonly known as: JARDIANCE Take 12.5 mg by mouth every morning.   furosemide 40 MG tablet Commonly known as: LASIX Take 1 tablet (40 mg total) by mouth daily.   isosorbide mononitrate  60 MG 24 hr tablet Commonly known as: IMDUR Take 1 tablet (60 mg total) by mouth daily. Start taking on: August 07, 2021 What changed:  medication strength how much to take   nitroGLYCERIN 0.4 MG SL tablet Commonly known as: NITROSTAT Place 1 tablet (0.4 mg total) under the tongue every 5 (five) minutes x 3 doses as needed for chest pain.   pantoprazole 40 MG tablet Commonly known as: PROTONIX Take 40 mg by mouth daily.   Propylene Glycol 0.6 % Soln Place 1 drop into both eyes 4 (four) times daily.   timolol 0.5 %  ophthalmic gel-forming Commonly known as: TIMOPTIC-XR Place 1 drop into both eyes daily.   travoprost (benzalkonium) 0.004 % ophthalmic solution Commonly known as: TRAVATAN Place 1 drop into both eyes nightly.   vitamin B-12 500 MCG tablet Commonly known as: CYANOCOBALAMIN Take 1,000 mcg by mouth daily. Before breakfast         DISCHARGE INSTRUCTIONS:   Follow-up PCP 5 days Has follow-up with his cardiologist  If you experience worsening of your admission symptoms, develop shortness of breath, life threatening emergency, suicidal or homicidal thoughts you must seek medical attention immediately by calling 911 or calling your MD immediately  if symptoms less severe.  You Must read complete instructions/literature along with all the possible adverse reactions/side effects for all the Medicines you take and that have been prescribed to you. Take any new Medicines after you have completely understood and accept all the possible adverse reactions/side effects.   Please note  You were cared for by a hospitalist during your hospital stay. If you have any questions about your discharge medications or the care you received while you were in the hospital after you are discharged, you can call the unit and asked to speak with the hospitalist on call if the hospitalist that took care of you is not available. Once you are discharged, your primary care physician will handle any further medical issues. Please note that NO REFILLS for any discharge medications will be authorized once you are discharged, as it is imperative that you return to your primary care physician (or establish a relationship with a primary care physician if you do not have one) for your aftercare needs so that they can reassess your need for medications and monitor your lab values.    Today   CHIEF COMPLAINT:   Chief Complaint  Patient presents with  . Chest Pain    HISTORY OF PRESENT ILLNESS:  Bradley [name]  is a 80  y.o. male came in with chest pain   VITAL SIGNS:  Blood pressure 131/71, pulse 79, temperature 97.8 F (36.6 C), temperature source Oral, resp. rate 18, height 6' (1.829 m), weight 84.8 kg, SpO2 94 %.  I/O:  No intake or output data in the 24 hours ending 08/06/21 1654  PHYSICAL EXAMINATION:  GENERAL:  80 y.o.-year-old patient lying in the bed with no acute distress.  EYES: Pupils equal, round, reactive to light and accommodation. No scleral icterus.  HEENT: Head atraumatic, normocephalic. Oropharynx and nasopharynx clear.   LUNGS: Normal breath sounds bilaterally, no wheezing, rales,rhonchi or crepitation. No use of accessory muscles of respiration.  CARDIOVASCULAR: S1, S2 irregularly irregular. No murmurs, rubs, or gallops.  ABDOMEN: Soft, non-tender, non-distended. Bowel sounds present  EXTREMITIES: No pedal edema, cyanosis, or clubbing.  NEUROLOGIC: Cranial nerves II through XII are intact. Muscle strength 5/5 in all extremities. Sensation intact. Gait not checked.  PSYCHIATRIC: The patient is alert and oriented  x 3.  SKIN: No obvious rash, lesion, or ulcer.   DATA REVIEW:   CBC Recent Labs  Lab 08/06/21 0611  WBC 8.7  HGB 11.0*  HCT 33.8*  PLT 212    Chemistries  Recent Labs  Lab 08/06/21 0611  NA 136  K 3.8  CL 105  CO2 22  GLUCOSE 108*  BUN 30*  CREATININE 1.44*  CALCIUM 8.8*     Microbiology Results  Results for orders placed or performed during the hospital encounter of 08/05/21  Resp Panel by RT-PCR (Flu A&B, Covid) Nasopharyngeal Swab     Status: None   Collection Time: 08/05/21  9:05 AM   Specimen: Nasopharyngeal Swab; Nasopharyngeal(NP) swabs in vial transport medium  Result Value Ref Range Status   SARS Coronavirus 2 by RT PCR NEGATIVE NEGATIVE Final    Comment: (NOTE) SARS-CoV-2 target nucleic acids are NOT DETECTED.  The SARS-CoV-2 RNA is generally detectable in upper respiratory specimens during the acute phase of infection. The  lowest concentration of SARS-CoV-2 viral copies this assay can detect is 138 copies/mL. A negative result does not preclude SARS-Cov-2 infection and should not be used as the sole basis for treatment or other patient management decisions. A negative result may occur with  improper specimen collection/handling, submission of specimen other than nasopharyngeal swab, presence of viral mutation(s) within the areas targeted by this assay, and inadequate number of viral copies(<138 copies/mL). A negative result must be combined with clinical observations, patient history, and epidemiological information. The expected result is Negative.  Fact Sheet for Patients:  EntrepreneurPulse.com.au  Fact Sheet for Healthcare Providers:  IncredibleEmployment.be  This test is no t yet approved or cleared by the [place] FDA and  has been authorized for detection and/or diagnosis of SARS-CoV-2 by FDA under an Emergency Use Authorization (EUA). This EUA will remain  in effect (meaning this test can be used) for the duration of the COVID-19 declaration under Section 564(b)(1) of the Act, 21 U.S.C.section 360bbb-3(b)(1), unless the authorization is terminated  or revoked sooner.       Influenza A by PCR NEGATIVE NEGATIVE Final   Influenza B by PCR NEGATIVE NEGATIVE Final    Comment: (NOTE) The Xpert Xpress SARS-CoV-2/FLU/RSV plus assay is intended as an aid in the diagnosis of influenza from Nasopharyngeal swab specimens and should not be used as a sole basis for treatment. Nasal washings and aspirates are unacceptable for Xpert Xpress SARS-CoV-2/FLU/RSV testing.  Fact Sheet for Patients: EntrepreneurPulse.com.au  Fact Sheet for Healthcare Providers: IncredibleEmployment.be  This test is not yet approved or cleared by the [place] FDA and has been authorized for detection and/or diagnosis of SARS-CoV-2 by FDA under  an Emergency Use Authorization (EUA). This EUA will remain in effect (meaning this test can be used) for the duration of the COVID-19 declaration under Section 564(b)(1) of the Act, 21 U.S.C. section 360bbb-3(b)(1), unless the authorization is terminated or revoked.  Performed at [facility], [address] [place], [place] [address]     RADIOLOGY:  DG Chest 2 View  Result Date: 08/05/2021 CLINICAL DATA:  Chest pain, shortness of breath. EXAM: CHEST - 2 VIEW COMPARISON:  July 09, 2021. FINDINGS: Stable cardiomediastinal silhouette. Status post coronary bypass graft. Mild central pulmonary vascular congestion is noted. Mildly improved bilateral pulmonary edema is noted. Small bilateral pleural effusions are noted. Bony thorax is unremarkable. IMPRESSION: Mild central pulmonary vascular congestion is noted with probable improved bilateral pulmonary edema. Electronically Signed   By: Jeneen Rinks  Murlean Caller M.D.   On: 08/05/2021 07:58      Management plans discussed with the patient, family and they are in agreement.  CODE STATUS:     Code Status Orders  (From admission, onward)           Start     Ordered   08/05/21 0907  Do not attempt resuscitation (DNR)  Continuous       Question Answer Comment  In the event of cardiac or respiratory ARREST Do not call a "code blue"   In the event of cardiac or respiratory ARREST Do not perform Intubation, CPR, defibrillation or ACLS   In the event of cardiac or respiratory ARREST Use medication by any route, position, wound care, and other measures to relive pain and suffering. May use oxygen, suction and manual treatment of airway obstruction as needed for comfort.   Comments nurse may pronounce      08/05/21 0907           Code Status History     Date Active Date Inactive Code Status Order ID Comments User Context   08/05/2021 0905 08/05/2021 0907 DNR [id number]  Loletha Grayer, MD ED   07/10/2021 0024 07/11/2021 1921  Full Code [id number]  Athena Masse, MD ED   03/14/2018 0038 03/14/2018 1454 Full Code [id number]  Amelia Jo, MD Inpatient       TOTAL TIME TAKING CARE OF THIS PATIENT: 35 minutes.    Loletha Grayer M.D on 08/06/2021 at 4:54 PM   [facility]  CC: Primary care physician; Apolonio Schneiders, MD

## 2021-08-06 NOTE — Care Management Obs Status (Deleted)
MEDICARE OBSERVATION STATUS NOTIFICATION   Patient Details  Name: Bradley [name] MRN: [id number] Date of Birth: 01/23/1941   Medicare Observation Status Notification Given:  Yes    Allayne Butcher, RN 08/06/2021, 11:29 AM

## 2021-10-30 ENCOUNTER — Ambulatory Visit: Payer: No Typology Code available for payment source | Admitting: Cardiovascular Disease

## 2021-10-30 DIAGNOSIS — I4819 Other persistent atrial fibrillation: Secondary | ICD-10-CM | POA: Insufficient documentation

## 2021-10-30 NOTE — Progress Notes (Deleted)
NO SHOW

## 2021-10-31 ENCOUNTER — Encounter: Payer: Self-pay | Admitting: Cardiovascular Disease

## 2021-12-08 NOTE — Progress Notes (Signed)
Cardiology Office Note ? ?Date:  12/10/2021  ? ?ID:  Bradley [name], DOB 1941-04-27, MRN [id number] ? ?PCP:  Apolonio Schneiders, MD  ? ?Chief Complaint  ?Patient presents with  ? New Patient (Initial Visit)  ?  [facility] [place] [facility] s/p CABG x 3 @ [facility] 2016. Medications reviewed by the patient verbally.   ? ? ?HPI:  ?[name] is a 81 yo M with PMHx of  ?CAD s/p CABG X 3V 2016, HFrEF (30-35%), moderate AS, Afib (on AC), CKD stage 3 (baseline Cr ~1.4), DM2 with diabetic retinopathy, HTN, HLD, and gout who presented to [facility] January 2023 with worsening chest pain.  ?Who presents for new patient evaluation in the [place] office for [place], Afib ?  ?Patient has a significant history of heart disease. He was recently hospitalized at [facility] 08/05/21 for CP and DOE and was found to be in afib and started on apixaban. He had a LHC on 06/2021 at [facility] that showed RPDA distal to SVG anastomosis occlusion and was medically managed. ? ? 08/2021 chest pain and SOB prompted him to present to the ED at [facility] ?Unsuccessful PCI attempt of distal SVG to PL lesion.  ? increased swelling in both of his legs,  ? ECG showed afib, rate controlled. No evidence of ischemia or infarct.   ?CXR showed with pulmonary edema and small bilateral pleural effusion. Patient was admitted to cardiology.  ? ?Cardioversion for atrial fibrillation at the [place] 3 weeks ago ? 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 ?EKG personally reviewed by myself on todays visit ?NSR rate 53 nonspecific ST ABN ? ? ?PMH:   has a past medical history of (HFpEF) heart failure with preserved ejection fraction ([place]), Coronary artery disease, Diabetes mellitus without complication ([place]), Hyperlipidemia, and Hypertension. ? ?PSH:    ?Past Surgical History:  ?Procedure Laterality Date  ? CATARACT EXTRACTION    ?  CHOLECYSTECTOMY    ? 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  ? [facility]: LIMA-LAD, SVG-OM1, and SVG-rPDA  ? LEFT HEART CATH AND CORS/GRAFTS ANGIOGRAPHY N/A 07/10/2021  ? Procedure: LEFT HEART CATH AND CORS/GRAFTS ANGIOGRAPHY;  Surgeon: Nelva Bush, MD;  Location: [place] [facility];  Service: Cardiovascular;  Laterality: N/A;  ? ? ?Current Outpatient Medications  ?Medication Sig Dispense Refill  ? apixaban (ELIQUIS) 5 MG TABS tablet Take 1 tablet (5 mg total) by mouth 2 (two) times daily. 60 tablet 0  ? atorvastatin (LIPITOR) 80 MG tablet Take 40 mg by mouth daily.    ? Brinzolamide-Brimonidine 1-0.2 % SUSP Place 1 drop into the left eye 3 (three) times daily.    ? clopidogrel (PLAVIX) 75 MG tablet Take 1 tablet (75 mg total) by mouth daily with breakfast. 30 tablet 2  ? diclofenac (VOLTAREN) 75 MG EC tablet Take by mouth.    ? empagliflozin (JARDIANCE) 25 MG TABS tablet Take 12.5 mg by mouth every morning.    ? furosemide (LASIX) 40 MG tablet Take 1 tablet (40 mg total) by mouth daily. 30 tablet 2  ? glipiZIDE (GLUCOTROL) 5 MG tablet Take by mouth.    ? indomethacin (INDOCIN) 50 MG capsule Take by mouth.    ? isosorbide mononitrate (IMDUR) 60 MG 24 hr tablet Take 1 tablet (60 mg total) by mouth daily. 30 tablet 0  ? meloxicam (MOBIC) 7.5  MG tablet Mobic 7.5 mg tablet ? Take 1 tablet twice a day by oral route.    ? metoprolol succinate (TOPROL-XL) 50 MG 24 hr tablet Take by mouth.    ? nitroGLYCERIN (NITROSTAT) 0.4 MG SL tablet Place 1 tablet (0.4 mg total) under the tongue every 5 (five) minutes x 3 doses as needed for chest pain. 30 tablet 2  ? pantoprazole (PROTONIX) 40 MG tablet Take 40 mg by mouth daily.    ? Propylene Glycol 0.6 % SOLN Place 1 drop into both eyes 4 (four) times daily.    ? sacubitril-valsartan (ENTRESTO) 24-26 MG Take 1 tablet by mouth every 12 (twelve) hours.    ? spironolactone (ALDACTONE) 25 MG tablet Take 1 tablet by mouth daily.    ? timolol (TIMOPTIC-XR) 0.5 % ophthalmic gel-forming  Place 1 drop into both eyes daily.    ? traMADol (ULTRAM) 50 MG tablet tramadol 50 mg tablet ? Take 1 tablet every 6 hours by oral route.    ? travoprost, benzalkonium, (TRAVATAN) 0.004 % ophthalmic solution Place 1 drop into both eyes nightly.    ? valsartan (DIOVAN) 40 MG tablet Take by mouth.    ? vitamin B-12 (CYANOCOBALAMIN) 500 MCG tablet Take 1,000 mcg by mouth daily. Before breakfast    ? acetaminophen (TYLENOL) 325 MG tablet Take 650-1,300 mg by mouth 2 (two) times daily as needed. (Patient not taking: Reported on 12/10/2021)    ? ?No current facility-administered medications for this visit.  ? ? ? ?Allergies:   Simvastatin  ? ?Social History:  The patient  reports that he has quit smoking. He has never used smokeless tobacco. He reports that he does not drink alcohol and does not use drugs.  ? ?Family History:   family history includes CAD in his mother; Cancer in his brother and sister; Hip fracture in his father; Urinary tract infection in his father.  ? ? ?Review of Systems: ?Review of Systems  ?Constitutional: Negative.   ?HENT: Negative.    ?Respiratory: Negative.    ?Cardiovascular: Negative.   ?Gastrointestinal: Negative.   ?Musculoskeletal: Negative.   ?Neurological: Negative.   ?Psychiatric/Behavioral: Negative.    ?All other systems reviewed and are negative. ? ? ?PHYSICAL EXAM: ?VS:  BP (!) 140/58 (BP Location: Left Arm, Patient Position: Sitting, Cuff Size: Normal)   Pulse (!) 53   Ht 6' (1.829 m)   Wt 83.6 kg   SpO2 98%   BMI 25.01 kg/m?  , BMI Body mass index is 25.01 kg/m?. ?GEN: Well nourished, well developed, in no acute distress ?HEENT: normal ?Neck: no JVD, carotid bruits, or masses ?Cardiac: RRR; no murmurs, rubs, or gallops,no edema  ?Respiratory:  clear to auscultation bilaterally, normal work of breathing ?GI: soft, nontender, nondistended, + BS ?MS: no deformity or atrophy ?Skin: warm and dry, no rash ?Neuro:  Strength and sensation are intact ?Psych: euthymic mood, full  affect ? ?Recent Labs: ?07/09/2021: ALT 17; Magnesium 2.4 ?08/05/2021: B Natriuretic Peptide 514.0; TSH 1.042 ?08/06/2021: BUN 30; Creatinine, Ser 1.44; Hemoglobin 11.0; Platelets 212; Potassium 3.8; Sodium 136  ? ? ?Lipid Panel ?Lab Results  ?Component Value Date  ? CHOL 81 08/06/2021  ? HDL 33 (L) 08/06/2021  ? [place] 31 08/06/2021  ? TRIG 87 08/06/2021  ? ?  ? ?Wt Readings from Last 3 Encounters:  ?12/10/21 83.6 kg  ?08/05/21 84.8 kg  ?07/11/21 81.2 kg  ?  ? ?ASSESSMENT AND PLAN: ? ?Problem List Items Addressed This Visit   ? ?  S/P CABG (coronary artery bypass graft)  ? Essential hypertension  ? Relevant Medications  ? metoprolol succinate (TOPROL-XL) 50 MG 24 hr tablet  ? sacubitril-valsartan (ENTRESTO) 24-26 MG  ? spironolactone (ALDACTONE) 25 MG tablet  ? valsartan (DIOVAN) 40 MG tablet  ? CKD stage 3 due to type 2 diabetes mellitus ([place])  ? Relevant Medications  ? glipiZIDE (GLUCOTROL) 5 MG tablet  ? valsartan (DIOVAN) 40 MG tablet  ? AF (paroxysmal atrial fibrillation) ([place])  ? Relevant Medications  ? metoprolol succinate (TOPROL-XL) 50 MG 24 hr tablet  ? sacubitril-valsartan (ENTRESTO) 24-26 MG  ? spironolactone (ALDACTONE) 25 MG tablet  ? valsartan (DIOVAN) 40 MG tablet  ? CAD (coronary artery disease) - Primary  ? Relevant Medications  ? metoprolol succinate (TOPROL-XL) 50 MG 24 hr tablet  ? sacubitril-valsartan (ENTRESTO) 24-26 MG  ? spironolactone (ALDACTONE) 25 MG tablet  ? valsartan (DIOVAN) 40 MG tablet  ? Ischemic cardiomyopathy  ? Relevant Medications  ? metoprolol succinate (TOPROL-XL) 50 MG 24 hr tablet  ? sacubitril-valsartan (ENTRESTO) 24-26 MG  ? spironolactone (ALDACTONE) 25 MG tablet  ? valsartan (DIOVAN) 40 MG tablet  ? Acute on chronic diastolic CHF (congestive heart failure) ([place])  ? Relevant Medications  ? metoprolol succinate (TOPROL-XL) 50 MG 24 hr tablet  ? sacubitril-valsartan (ENTRESTO) 24-26 MG  ? spironolactone (ALDACTONE) 25 MG tablet  ? valsartan (DIOVAN) 40 MG tablet  ? ?#  Acute on chronic systolic and diastolic heart failure ?Maintaining normal sinus rhythm, on Lasix 40 daily ?Ejection fraction 30 to 35% on echo November 2022 ?We will go through the [place] records to see if updated echoc

## 2021-12-10 ENCOUNTER — Encounter: Payer: Self-pay | Admitting: Cardiovascular Disease

## 2021-12-10 ENCOUNTER — Ambulatory Visit (INDEPENDENT_AMBULATORY_CARE_PROVIDER_SITE_OTHER): Payer: No Typology Code available for payment source | Admitting: Cardiovascular Disease

## 2021-12-10 VITALS — BP 140/58 | HR 53 | Ht 72.0 in | Wt 184.4 lb

## 2021-12-10 DIAGNOSIS — I255 Ischemic cardiomyopathy: Secondary | ICD-10-CM | POA: Diagnosis not present

## 2021-12-10 DIAGNOSIS — I5033 Acute on chronic diastolic (congestive) heart failure: Secondary | ICD-10-CM

## 2021-12-10 DIAGNOSIS — I25118 Atherosclerotic heart disease of native coronary artery with other forms of angina pectoris: Secondary | ICD-10-CM | POA: Diagnosis not present

## 2021-12-10 DIAGNOSIS — I480 Paroxysmal atrial fibrillation: Secondary | ICD-10-CM

## 2021-12-10 DIAGNOSIS — N183 Chronic kidney disease, stage 3 unspecified: Secondary | ICD-10-CM

## 2021-12-10 DIAGNOSIS — I10 Essential (primary) hypertension: Secondary | ICD-10-CM

## 2021-12-10 DIAGNOSIS — Z951 Presence of aortocoronary bypass graft: Secondary | ICD-10-CM

## 2021-12-10 DIAGNOSIS — E1122 Type 2 diabetes mellitus with diabetic chronic kidney disease: Secondary | ICD-10-CM

## 2021-12-10 NOTE — Patient Instructions (Addendum)
Medication Instructions:  ?-  Your physician has recommended you make the following change in your medication:  ? ?1) STOP the Hydrochlorothiazide (HCTZ) ? ?2) STOP the Coreg (Carvedilol)  ? ?Please review the medication list give to you today and compare with what you are taking at home. If there are any discrepancies, then please call us back at [phone] and let us know what these are ? ?If you need a refill on your cardiac medications before your next appointment, please call your pharmacy.  ? ? ?Lab work: ?No new labs needed ? ? ?Testing/Procedures: ?No new testing needed ? ? ?Follow-Up: ?At [facility], you and your health needs are our priority.  As part of our continuing mission to provide you with exceptional heart care, we have created designated Provider Care Teams.  These Care Teams include your primary Cardiologist (physician) and Advanced Practice Providers (APPs -  Physician Assistants and Nurse Practitioners) who all work together to provide you with the care you need, when you need it. ? ?You will need a follow up appointment in 6 months ? ?Providers on your designated Care Team:   ?Murray Hodgkins, NP ?Christell Faith, PA-C ?Cadence Kathlen Mody, PA-C ? ?COVID-19 Vaccine Information can be found at: ShippingScam.co.uk For questions related to vaccine distribution or appointments, please email [email or web address] or call [phone].  ? ?

## 2021-12-17 NOTE — Addendum Note (Signed)
Addended by: Anselm Pancoast on: 12/17/2021 08:13 AM ? ? Modules accepted: Orders ? ?

## 2022-03-11 ENCOUNTER — Emergency Department: Payer: No Typology Code available for payment source

## 2022-03-11 ENCOUNTER — Other Ambulatory Visit: Payer: Self-pay

## 2022-03-11 ENCOUNTER — Inpatient Hospital Stay
Admission: EM | Admit: 2022-03-11 | Discharge: 2022-03-14 | DRG: 480 | Disposition: A | Discharge status: Home / Self Care | Payer: No Typology Code available for payment source | Attending: Obstetrics and Gynecology | Admitting: Obstetrics and Gynecology

## 2022-03-11 DIAGNOSIS — E1165 Type 2 diabetes mellitus with hyperglycemia: Secondary | ICD-10-CM | POA: Diagnosis not present

## 2022-03-11 DIAGNOSIS — N1831 Chronic kidney disease, stage 3a: Secondary | ICD-10-CM | POA: Diagnosis present

## 2022-03-11 DIAGNOSIS — Z8249 Family history of ischemic heart disease and other diseases of the circulatory system: Secondary | ICD-10-CM

## 2022-03-11 DIAGNOSIS — I252 Old myocardial infarction: Secondary | ICD-10-CM

## 2022-03-11 DIAGNOSIS — S72141A Displaced intertrochanteric fracture of right femur, initial encounter for closed fracture: Secondary | ICD-10-CM | POA: Diagnosis not present

## 2022-03-11 DIAGNOSIS — N183 Chronic kidney disease, stage 3 unspecified: Secondary | ICD-10-CM | POA: Diagnosis present

## 2022-03-11 DIAGNOSIS — Z791 Long term (current) use of non-steroidal anti-inflammatories (NSAID): Secondary | ICD-10-CM

## 2022-03-11 DIAGNOSIS — Z87891 Personal history of nicotine dependence: Secondary | ICD-10-CM

## 2022-03-11 DIAGNOSIS — R001 Bradycardia, unspecified: Secondary | ICD-10-CM | POA: Diagnosis present

## 2022-03-11 DIAGNOSIS — Z888 Allergy status to other drugs, medicaments and biological substances status: Secondary | ICD-10-CM

## 2022-03-11 DIAGNOSIS — Z951 Presence of aortocoronary bypass graft: Secondary | ICD-10-CM

## 2022-03-11 DIAGNOSIS — I130 Hypertensive heart and chronic kidney disease with heart failure and stage 1 through stage 4 chronic kidney disease, or unspecified chronic kidney disease: Secondary | ICD-10-CM | POA: Diagnosis present

## 2022-03-11 DIAGNOSIS — Z7902 Long term (current) use of antithrombotics/antiplatelets: Secondary | ICD-10-CM

## 2022-03-11 DIAGNOSIS — E1122 Type 2 diabetes mellitus with diabetic chronic kidney disease: Secondary | ICD-10-CM | POA: Diagnosis present

## 2022-03-11 DIAGNOSIS — I10 Essential (primary) hypertension: Secondary | ICD-10-CM | POA: Diagnosis present

## 2022-03-11 DIAGNOSIS — I4891 Unspecified atrial fibrillation: Secondary | ICD-10-CM

## 2022-03-11 DIAGNOSIS — I5023 Acute on chronic systolic (congestive) heart failure: Secondary | ICD-10-CM

## 2022-03-11 DIAGNOSIS — W010XXA Fall on same level from slipping, tripping and stumbling without subsequent striking against object, initial encounter: Secondary | ICD-10-CM | POA: Diagnosis present

## 2022-03-11 DIAGNOSIS — Z79899 Other long term (current) drug therapy: Secondary | ICD-10-CM

## 2022-03-11 DIAGNOSIS — Z7901 Long term (current) use of anticoagulants: Secondary | ICD-10-CM

## 2022-03-11 DIAGNOSIS — I5022 Chronic systolic (congestive) heart failure: Secondary | ICD-10-CM

## 2022-03-11 DIAGNOSIS — N179 Acute kidney failure, unspecified: Secondary | ICD-10-CM | POA: Diagnosis present

## 2022-03-11 DIAGNOSIS — I2510 Atherosclerotic heart disease of native coronary artery without angina pectoris: Secondary | ICD-10-CM | POA: Diagnosis present

## 2022-03-11 DIAGNOSIS — I5043 Acute on chronic combined systolic (congestive) and diastolic (congestive) heart failure: Secondary | ICD-10-CM | POA: Diagnosis present

## 2022-03-11 DIAGNOSIS — Y92015 Private garage of single-family (private) house as the place of occurrence of the external cause: Secondary | ICD-10-CM

## 2022-03-11 DIAGNOSIS — I4819 Other persistent atrial fibrillation: Secondary | ICD-10-CM | POA: Diagnosis present

## 2022-03-11 DIAGNOSIS — S72009A Fracture of unspecified part of neck of unspecified femur, initial encounter for closed fracture: Secondary | ICD-10-CM | POA: Diagnosis present

## 2022-03-11 DIAGNOSIS — I70201 Unspecified atherosclerosis of native arteries of extremities, right leg: Secondary | ICD-10-CM | POA: Diagnosis present

## 2022-03-11 DIAGNOSIS — H1132 Conjunctival hemorrhage, left eye: Secondary | ICD-10-CM | POA: Diagnosis present

## 2022-03-11 DIAGNOSIS — Z9849 Cataract extraction status, unspecified eye: Secondary | ICD-10-CM

## 2022-03-11 DIAGNOSIS — E785 Hyperlipidemia, unspecified: Secondary | ICD-10-CM | POA: Diagnosis present

## 2022-03-11 LAB — CBC WITH DIFFERENTIAL/PLATELET
Abs Immature Granulocytes: 0.07 10*3/uL (ref 0.00–0.07)
Basophils Absolute: 0.1 10*3/uL (ref 0.0–0.1)
Basophils Relative: 1 %
Eosinophils Absolute: 0.3 10*3/uL (ref 0.0–0.5)
Eosinophils Relative: 2 %
HCT: 41.2 % (ref 39.0–52.0)
Hemoglobin: 12.7 g/dL — ABNORMAL LOW (ref 13.0–17.0)
Immature Granulocytes: 1 %
Lymphocytes Relative: 12 %
Lymphs Abs: 1.5 10*3/uL (ref 0.7–4.0)
MCH: 31.9 pg (ref 26.0–34.0)
MCHC: 30.8 g/dL (ref 30.0–36.0)
MCV: 103.5 fL — ABNORMAL HIGH (ref 80.0–100.0)
Monocytes Absolute: 1 10*3/uL (ref 0.1–1.0)
Monocytes Relative: 8 %
Neutro Abs: 9.5 10*3/uL — ABNORMAL HIGH (ref 1.7–7.7)
Neutrophils Relative %: 76 %
Platelets: 189 10*3/uL (ref 150–400)
RBC: 3.98 MIL/uL — ABNORMAL LOW (ref 4.22–5.81)
RDW: 14.5 % (ref 11.5–15.5)
WBC: 12.3 10*3/uL — ABNORMAL HIGH (ref 4.0–10.5)
nRBC: 0 % (ref 0.0–0.2)

## 2022-03-11 MED ORDER — ONDANSETRON HCL 4 MG/2ML IJ SOLN
4.0000 mg | Freq: Once | INTRAMUSCULAR | Status: AC
  Administered 2022-03-11: 4 mg via INTRAVENOUS
  Filled 2022-03-11: qty 2

## 2022-03-11 MED ORDER — SODIUM CHLORIDE 0.9 % IV SOLN: INTRAVENOUS | Status: DC

## 2022-03-11 MED ORDER — FENTANYL CITRATE PF 50 MCG/ML IJ SOSY
50.0000 ug | PREFILLED_SYRINGE | Freq: Once | INTRAMUSCULAR | Status: AC
  Administered 2022-03-11: 50 ug via INTRAVENOUS
  Filled 2022-03-11: qty 1

## 2022-03-11 NOTE — ED Triage Notes (Signed)
Pt was walking in his garage and there was uneven ground. Pt tripped and fell onto right hip. Pt denies hitting head or LOC. Pt states he is unable to walk on right hip.

## 2022-03-11 NOTE — ED Provider Notes (Signed)
[facility] Provider Note    Event Date/Time   First MD Initiated Contact with Patient 03/11/22 2312     (approximate)   History   Hip Pain (Right /)   HPI  Bradley [name] is a 81 y.o. male with history of coronary artery disease status post CABG on Plavix, CHF, hypertension, diabetes, hyperlipidemia, atrial fibrillation on Eliquis who presents to the emergency department after he had a fall tonight at home.  States he tripped and fell onto his right side.  States he is sure he did not hit his head.  No headache, neck or back pain.  States he cannot walk due to the pain in the right hip.  Patient states he last took his Plavix and Eliquis this morning.  Did not take a dose tonight prior to coming to the ED.  He also has a subconjunctival hemorrhage of the left eye that he states he woke up with this morning.   History provided by patient and family.    Past Medical History:  Diagnosis Date   (HFpEF) heart failure with preserved ejection fraction (HCC)    Coronary artery disease    Diabetes mellitus without complication (HCC)    Hyperlipidemia    Hypertension     Past Surgical History:  Procedure Laterality Date   CATARACT EXTRACTION     CHOLECYSTECTOMY     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   [facility]: LIMA-LAD, SVG-OM1, and SVG-rPDA   LEFT HEART CATH AND CORS/GRAFTS ANGIOGRAPHY N/A 07/10/2021   Procedure: LEFT HEART CATH AND CORS/GRAFTS ANGIOGRAPHY;  Surgeon: Yvonne Kendall, MD;  Location: [facility] INVASIVE CV LAB;  Service: Cardiovascular;  Laterality: N/A;    MEDICATIONS:  Prior to Admission medications   Medication Sig Start Date End Date Taking? Authorizing Provider  acetaminophen (TYLENOL) 325 MG tablet Take 650-1,300 mg by mouth 2 (two) times daily as needed. Patient not taking: Reported on 12/10/2021    [provider]  apixaban (ELIQUIS) 5 MG TABS tablet Take 1 tablet (5 mg total) by mouth 2 (two) times daily. 08/06/21    Alford Highland, MD  atorvastatin (LIPITOR) 80 MG tablet Take 40 mg by mouth daily.    [provider]  Brinzolamide-Brimonidine 1-0.2 % SUSP Place 1 drop into the left eye 3 (three) times daily.    [provider]  clopidogrel (PLAVIX) 75 MG tablet Take 1 tablet (75 mg total) by mouth daily with breakfast. 07/12/21   Esaw Grandchild A, DO  diclofenac (VOLTAREN) 75 MG EC tablet Take by mouth.    [provider]  empagliflozin (JARDIANCE) 25 MG TABS tablet Take 12.5 mg by mouth every morning.    [provider]  furosemide (LASIX) 40 MG tablet Take 1 tablet (40 mg total) by mouth daily. 07/12/21   Esaw Grandchild A, DO  glipiZIDE (GLUCOTROL) 5 MG tablet Take by mouth.    [provider]  indomethacin (INDOCIN) 50 MG capsule Take by mouth.    [provider]  isosorbide mononitrate (IMDUR) 60 MG 24 hr tablet Take 1 tablet (60 mg total) by mouth daily. 08/07/21   Alford Highland, MD  meloxicam (MOBIC) 7.5 MG tablet Mobic 7.5 mg tablet  Take 1 tablet twice a day by oral route.    [provider]  metoprolol succinate (TOPROL-XL) 50 MG 24 hr tablet Take by mouth. 09/19/21 09/19/22  [provider]  nitroGLYCERIN (NITROSTAT) 0.4 MG SL tablet Place 1 tablet (0.4 mg total) under the tongue every  5 (five) minutes x 3 doses as needed for chest pain. 07/11/21   Esaw Grandchild A, DO  pantoprazole (PROTONIX) 40 MG tablet Take 40 mg by mouth daily.    [provider]  Propylene Glycol 0.6 % SOLN Place 1 drop into both eyes 4 (four) times daily.    [provider]  sacubitril-valsartan (ENTRESTO) 24-26 MG Take 1 tablet by mouth every 12 (twelve) hours. 09/18/21   [provider]  spironolactone (ALDACTONE) 25 MG tablet Take 1 tablet by mouth daily. 10/18/21   [provider]  timolol (TIMOPTIC-XR) 0.5 % ophthalmic gel-forming Place 1 drop into both eyes daily. 10/29/20   [provider]  traMADol  (ULTRAM) 50 MG tablet tramadol 50 mg tablet  Take 1 tablet every 6 hours by oral route.    [provider]  travoprost, benzalkonium, (TRAVATAN) 0.004 % ophthalmic solution Place 1 drop into both eyes nightly.    [provider]  valsartan (DIOVAN) 40 MG tablet Take by mouth. 09/19/21 09/19/22  [provider]  vitamin B-12 (CYANOCOBALAMIN) 500 MCG tablet Take 1,000 mcg by mouth daily. Before breakfast    [provider]    Physical Exam   Triage Vital Signs: ED Triage Vitals  Enc Vitals Group     BP 03/11/22 2103 (!) 163/48     Pulse Rate 03/11/22 2103 (!) 48     Resp --      Temp 03/11/22 2103 99.2 F (37.3 C)     Temp Source 03/11/22 2103 Oral     SpO2 03/11/22 2100 97 %     Weight 03/11/22 2102 180 lb (81.6 kg)     Height 03/11/22 2102 6' (1.829 m)     Head Circumference --      Peak Flow --      Pain Score 03/11/22 2129 10     Pain Loc --      Pain Edu? --      Excl. in GC? --     Most recent vital signs: Vitals:   03/11/22 2103 03/11/22 2345  BP: (!) 163/48 (!) 169/55  Pulse: (!) 48   Resp:  12  Temp: 99.2 F (37.3 C)   SpO2: 98% 98%     CONSTITUTIONAL: Alert and oriented and responds appropriately to questions. Well-appearing; well-nourished; GCS 15 HEAD: Normocephalic; atraumatic EYES: Conjunctivae clear, PERRL, EOMI ENT: normal nose; no rhinorrhea; moist mucous membranes; pharynx without lesions noted; no dental injury; no septal hematoma, no epistaxis; no facial deformity or bony tenderness NECK: Supple, no midline spinal tenderness, step-off or deformity; trachea midline CARD: RRR; S1 and S2 appreciated; no murmurs, no clicks, no rubs, no gallops RESP: Normal chest excursion without splinting or tachypnea; breath sounds clear and equal bilaterally; no wheezes, no rhonchi, no rales; no hypoxia or respiratory distress CHEST:  chest wall stable, no crepitus or ecchymosis or deformity, nontender to palpation; no flail  chest ABD/GI: Normal bowel sounds; non-distended; soft, non-tender, no rebound, no guarding; no ecchymosis or other lesions noted PELVIS:  stable, nontender to palpation BACK:  The back appears normal; no midline spinal tenderness, step-off or deformity EXT: No leg length discrepancy but patient does have tenderness over the right hip.  2+ DP pulses bilaterally.  Superficial abrasions and skin tears noted to the right elbow without bony tenderness, deformity or joint effusion.  No edema; normal capillary refill; no cyanosis, compartments are soft, extremities are warm and well-perfused SKIN: Normal color for age and race; warm NEURO:  No facial asymmetry, normal speech, moving all extremities equally  ED Results / Procedures / Treatments   LABS: (all labs ordered are listed, but only abnormal results are displayed) Labs Reviewed  CBC WITH DIFFERENTIAL/PLATELET - Abnormal; Notable for the following components:      Result Value   WBC 12.3 (*)    RBC 3.98 (*)    Hemoglobin 12.7 (*)    MCV 103.5 (*)    Neutro Abs 9.5 (*)    All other components within normal limits  BASIC METABOLIC PANEL  PROTIME-INR  TYPE AND SCREEN     EKG:    Date: 03/11/2022 23:27  Rate: 48  Rhythm: Sinus bradycardia  QRS Axis: normal  Intervals: normal  ST/T Wave abnormalities: Slight ST depression in lateral leads  Conduction Disutrbances: none  Narrative Interpretation: Sinus bradycardia, ST changes in lateral leads similar to previous EKGs     RADIOLOGY: My personal review and interpretation of imaging: Patient has a right intertrochanteric hip fracture.  I have personally reviewed all radiology reports. DG Hip Unilat With Pelvis 2-3 Views Right  Result Date: 03/11/2022 CLINICAL DATA:  Larey Seat, right hip injury EXAM: DG HIP (WITH OR WITHOUT PELVIS) 2-3V RIGHT COMPARISON:  None Available. FINDINGS: Frontal view of the pelvis as well as frontal and frogleg lateral views of the right hip are obtained.  There is a minimally displaced intertrochanteric right hip fracture with anatomic alignment. Symmetrical bilateral hip osteoarthritis. No subluxation or dislocation. Diffuse atherosclerosis. IMPRESSION: 1. Minimally displaced intertrochanteric right hip fracture, with anatomic alignment. Electronically Signed   By: Sharlet Salina M.D.   On: 03/11/2022 22:20     PROCEDURES:  Critical Care performed: No    .1-3 Lead EKG Interpretation  Performed by: Brick Ketcher, Layla Maw, DO Authorized by: Jakalyn Kratky, Layla Maw, DO     Interpretation: abnormal     ECG rate:  48   ECG rate assessment: bradycardic     Rhythm: sinus bradycardia     Ectopy: none     Conduction: normal       IMPRESSION / MDM / ASSESSMENT AND PLAN / ED COURSE  I reviewed the triage vital signs and the nursing notes.  Patient here with mechanical fall with right hip pain.  The patient is on the cardiac monitor to evaluate for evidence of arrhythmia and/or significant heart rate changes.   DIFFERENTIAL DIAGNOSIS (includes but not limited to):   Hip fracture, contusion, dislocation  Patient's presentation is most consistent with acute presentation with potential threat to life or bodily function.  PLAN: We will obtain CBC, BMP, x-ray of the right hip, EKG.  We will keep n.p.o. after midnight and give IV fluids, pain and nausea medicine.   MEDICATIONS GIVEN IN ED: Medications  0.9 %  sodium chloride infusion ( Intravenous New Bag/Given 03/11/22 2339)  fentaNYL (SUBLIMAZE) injection 50 mcg (50 mcg Intravenous Given 03/11/22 2344)  ondansetron (ZOFRAN) injection 4 mg (4 mg Intravenous Given 03/11/22 2344)     ED COURSE: X-ray reviewed/interpreted by myself and radiologist and shows a minimally displaced right intertrochanteric hip fracture.  Will discuss with orthopedics.   CONSULTS:  11:36 PM  Spoke with Dr. Joice Lofts who agrees with keeping patient n.p.o. after midnight and medicine admission.  Requests cardiology consult as well  for cardiac clearance in the morning.   OUTSIDE RECORDS REVIEWED: Reviewed patient's last admission for ischemic cardiomyopathy at [facility] on 09/11/2021.       FINAL CLINICAL IMPRESSION(S) / ED DIAGNOSES   Final  diagnoses:  Closed intertrochanteric fracture of hip, right, initial encounter [facility])     Rx / DC Orders   ED Discharge Orders     None        Note:  This document was prepared using Dragon voice recognition software and may include unintentional dictation errors.

## 2022-03-11 NOTE — ED Provider Triage Note (Signed)
  Emergency Medicine Provider Triage Evaluation Note  Fin Hupp , a 80 y.o.male,  was evaluated in triage.  Pt complains of right hip pain following mechanical fall earlier today.  Denies any preceding symptoms.  Denies any loss consciousness or injury to the head.  He states that he is unable to walk on his right lower extremity since the fall.  Brought in by EMS.   Review of Systems  Positive: Hip pain Negative: Denies fever, chest pain, vomiting  Physical Exam   Vitals:   03/11/22 2100 03/11/22 2103  BP:  (!) 163/48  Pulse:  (!) 48  Temp:  99.2 F (37.3 C)  SpO2: 97% 98%   Gen:   Awake, no distress   Resp:  Normal effort  MSK:   Moves extremities without difficulty  Other:  Tenderness along the right hip.  No tenderness along the thigh, knee, lower leg, or foot.  Medical Decision Making  Given the patient's initial medical screening exam, the following diagnostic evaluation has been ordered. The patient will be placed in the appropriate treatment space, once one is available, to complete the evaluation and treatment. I have discussed the plan of care with the patient and I have advised the patient that an ED physician or mid-level practitioner will reevaluate their condition after the test results have been received, as the results may give them additional insight into the type of treatment they may need.    Diagnostics: Right hip x-ray  Treatments: none immediately   Varney Daily, [place] 03/11/22 2132

## 2022-03-11 NOTE — ED Triage Notes (Signed)
EMS brings pt in from home; st tripped on concrete and fell; c/o rt hip pain with ambulation

## 2022-03-12 ENCOUNTER — Other Ambulatory Visit: Payer: Self-pay

## 2022-03-12 ENCOUNTER — Inpatient Hospital Stay: Payer: No Typology Code available for payment source

## 2022-03-12 ENCOUNTER — Inpatient Hospital Stay: Payer: No Typology Code available for payment source | Admitting: Anesthesiology

## 2022-03-12 ENCOUNTER — Emergency Department: Payer: No Typology Code available for payment source

## 2022-03-12 ENCOUNTER — Inpatient Hospital Stay (HOSPITAL_COMMUNITY)
Admit: 2022-03-12 | Discharge: 2022-03-12 | Disposition: A | Discharge status: Home / Self Care | Payer: No Typology Code available for payment source | Attending: Internal Medicine | Admitting: Internal Medicine

## 2022-03-12 ENCOUNTER — Encounter
Admission: EM | Disposition: A | Discharge status: Home / Self Care | Payer: Self-pay | Source: Home / Self Care | Attending: Obstetrics and Gynecology

## 2022-03-12 DIAGNOSIS — S72001D Fracture of unspecified part of neck of right femur, subsequent encounter for closed fracture with routine healing: Secondary | ICD-10-CM | POA: Diagnosis not present

## 2022-03-12 DIAGNOSIS — I4891 Unspecified atrial fibrillation: Secondary | ICD-10-CM | POA: Diagnosis not present

## 2022-03-12 DIAGNOSIS — W010XXA Fall on same level from slipping, tripping and stumbling without subsequent striking against object, initial encounter: Secondary | ICD-10-CM | POA: Diagnosis present

## 2022-03-12 DIAGNOSIS — I130 Hypertensive heart and chronic kidney disease with heart failure and stage 1 through stage 4 chronic kidney disease, or unspecified chronic kidney disease: Secondary | ICD-10-CM | POA: Diagnosis present

## 2022-03-12 DIAGNOSIS — I4819 Other persistent atrial fibrillation: Secondary | ICD-10-CM

## 2022-03-12 DIAGNOSIS — Y92015 Private garage of single-family (private) house as the place of occurrence of the external cause: Secondary | ICD-10-CM | POA: Diagnosis not present

## 2022-03-12 DIAGNOSIS — S72001A Fracture of unspecified part of neck of right femur, initial encounter for closed fracture: Secondary | ICD-10-CM | POA: Diagnosis not present

## 2022-03-12 DIAGNOSIS — I5043 Acute on chronic combined systolic (congestive) and diastolic (congestive) heart failure: Secondary | ICD-10-CM | POA: Diagnosis present

## 2022-03-12 DIAGNOSIS — N1831 Chronic kidney disease, stage 3a: Secondary | ICD-10-CM | POA: Diagnosis present

## 2022-03-12 DIAGNOSIS — I252 Old myocardial infarction: Secondary | ICD-10-CM | POA: Diagnosis not present

## 2022-03-12 DIAGNOSIS — S72009A Fracture of unspecified part of neck of unspecified femur, initial encounter for closed fracture: Secondary | ICD-10-CM | POA: Diagnosis present

## 2022-03-12 DIAGNOSIS — Z888 Allergy status to other drugs, medicaments and biological substances status: Secondary | ICD-10-CM | POA: Diagnosis not present

## 2022-03-12 DIAGNOSIS — Z9849 Cataract extraction status, unspecified eye: Secondary | ICD-10-CM | POA: Diagnosis not present

## 2022-03-12 DIAGNOSIS — I2510 Atherosclerotic heart disease of native coronary artery without angina pectoris: Secondary | ICD-10-CM | POA: Diagnosis present

## 2022-03-12 DIAGNOSIS — Z951 Presence of aortocoronary bypass graft: Secondary | ICD-10-CM | POA: Diagnosis not present

## 2022-03-12 DIAGNOSIS — N179 Acute kidney failure, unspecified: Secondary | ICD-10-CM | POA: Diagnosis present

## 2022-03-12 DIAGNOSIS — E1122 Type 2 diabetes mellitus with diabetic chronic kidney disease: Secondary | ICD-10-CM | POA: Diagnosis present

## 2022-03-12 DIAGNOSIS — Z79899 Other long term (current) drug therapy: Secondary | ICD-10-CM | POA: Diagnosis not present

## 2022-03-12 DIAGNOSIS — R001 Bradycardia, unspecified: Secondary | ICD-10-CM | POA: Diagnosis present

## 2022-03-12 DIAGNOSIS — S72141A Displaced intertrochanteric fracture of right femur, initial encounter for closed fracture: Secondary | ICD-10-CM | POA: Diagnosis present

## 2022-03-12 DIAGNOSIS — E785 Hyperlipidemia, unspecified: Secondary | ICD-10-CM | POA: Diagnosis present

## 2022-03-12 DIAGNOSIS — I350 Nonrheumatic aortic (valve) stenosis: Secondary | ICD-10-CM | POA: Diagnosis not present

## 2022-03-12 DIAGNOSIS — H1132 Conjunctival hemorrhage, left eye: Secondary | ICD-10-CM | POA: Diagnosis present

## 2022-03-12 DIAGNOSIS — Z791 Long term (current) use of non-steroidal anti-inflammatories (NSAID): Secondary | ICD-10-CM | POA: Diagnosis not present

## 2022-03-12 DIAGNOSIS — Z7902 Long term (current) use of antithrombotics/antiplatelets: Secondary | ICD-10-CM | POA: Diagnosis not present

## 2022-03-12 DIAGNOSIS — I429 Cardiomyopathy, unspecified: Secondary | ICD-10-CM | POA: Diagnosis not present

## 2022-03-12 DIAGNOSIS — I5022 Chronic systolic (congestive) heart failure: Secondary | ICD-10-CM

## 2022-03-12 DIAGNOSIS — Z87891 Personal history of nicotine dependence: Secondary | ICD-10-CM | POA: Diagnosis not present

## 2022-03-12 DIAGNOSIS — I70201 Unspecified atherosclerosis of native arteries of extremities, right leg: Secondary | ICD-10-CM | POA: Diagnosis present

## 2022-03-12 DIAGNOSIS — Z0189 Encounter for other specified special examinations: Secondary | ICD-10-CM

## 2022-03-12 DIAGNOSIS — E1165 Type 2 diabetes mellitus with hyperglycemia: Secondary | ICD-10-CM | POA: Diagnosis not present

## 2022-03-12 DIAGNOSIS — Z7901 Long term (current) use of anticoagulants: Secondary | ICD-10-CM | POA: Diagnosis not present

## 2022-03-12 DIAGNOSIS — Z8249 Family history of ischemic heart disease and other diseases of the circulatory system: Secondary | ICD-10-CM | POA: Diagnosis not present

## 2022-03-12 HISTORY — PX: INTRAMEDULLARY (IM) NAIL INTERTROCHANTERIC: SHX5875

## 2022-03-12 LAB — BASIC METABOLIC PANEL
Anion gap: 7 (ref 5–15)
Anion gap: 5 (ref 5–15)
BUN: 40 mg/dL — ABNORMAL HIGH (ref 8–23)
BUN: 34 mg/dL — ABNORMAL HIGH (ref 8–23)
CO2: 25 mmol/L (ref 22–32)
CO2: 23 mmol/L (ref 22–32)
Calcium: 8.8 mg/dL — ABNORMAL LOW (ref 8.9–10.3)
Calcium: 8.3 mg/dL — ABNORMAL LOW (ref 8.9–10.3)
Chloride: 108 mmol/L (ref 98–111)
Chloride: 111 mmol/L (ref 98–111)
Creatinine, Ser: 1.79 mg/dL — ABNORMAL HIGH (ref 0.61–1.24)
Creatinine, Ser: 1.46 mg/dL — ABNORMAL HIGH (ref 0.61–1.24)
GFR, Estimated: 38 mL/min — ABNORMAL LOW (ref >60)
GFR, Estimated: 48 mL/min — ABNORMAL LOW (ref >60)
Glucose, Bld: 201 mg/dL — ABNORMAL HIGH (ref 70–99)
Glucose, Bld: 143 mg/dL — ABNORMAL HIGH (ref 70–99)
Potassium: 5.1 mmol/L (ref 3.5–5.1)
Potassium: 4.6 mmol/L (ref 3.5–5.1)
Sodium: 140 mmol/L (ref 135–145)
Sodium: 139 mmol/L (ref 135–145)

## 2022-03-12 LAB — CBC
HCT: 40 % (ref 39.0–52.0)
Hemoglobin: 12.4 g/dL — ABNORMAL LOW (ref 13.0–17.0)
MCH: 32 pg (ref 26.0–34.0)
MCHC: 31 g/dL (ref 30.0–36.0)
MCV: 103.1 fL — ABNORMAL HIGH (ref 80.0–100.0)
Platelets: 173 10*3/uL (ref 150–400)
RBC: 3.88 MIL/uL — ABNORMAL LOW (ref 4.22–5.81)
RDW: 14.6 % (ref 11.5–15.5)
WBC: 11.2 10*3/uL — ABNORMAL HIGH (ref 4.0–10.5)
nRBC: 0 % (ref 0.0–0.2)

## 2022-03-12 LAB — ECHOCARDIOGRAM COMPLETE
AR max vel: 0.59 cm2
AV Area VTI: 0.59 cm2
AV Area mean vel: 0.6 cm2
AV Mean grad: 14 mmHg
AV Peak grad: 24.2 mmHg
Ao pk vel: 2.46 m/s
Area-P 1/2: 3.7 cm2
Height: 72 in
P 1/2 time: 913 msec
S' Lateral: 3.49 cm
Weight: 2880 oz

## 2022-03-12 LAB — PROTIME-INR
INR: 1.2 (ref 0.8–1.2)
Prothrombin Time: 15.3 seconds — ABNORMAL HIGH (ref 11.4–15.2)

## 2022-03-12 LAB — CBG MONITORING, ED
Glucose-Capillary: 142 mg/dL — ABNORMAL HIGH (ref 70–99)
Glucose-Capillary: 138 mg/dL — ABNORMAL HIGH (ref 70–99)
Glucose-Capillary: 130 mg/dL — ABNORMAL HIGH (ref 70–99)
Glucose-Capillary: 131 mg/dL — ABNORMAL HIGH (ref 70–99)

## 2022-03-12 LAB — TROPONIN I (HIGH SENSITIVITY)
Troponin I (High Sensitivity): 10 ng/L (ref <18)
Troponin I (High Sensitivity): 10 ng/L (ref <18)

## 2022-03-12 LAB — HEMOGLOBIN A1C
Hgb A1c MFr Bld: 7.8 % — ABNORMAL HIGH (ref 4.8–5.6)
Mean Plasma Glucose: 177.16 mg/dL

## 2022-03-12 LAB — TYPE AND SCREEN
ABO/RH(D): B POS
Antibody Screen: NEGATIVE

## 2022-03-12 LAB — BRAIN NATRIURETIC PEPTIDE: B Natriuretic Peptide: 739.8 pg/mL — ABNORMAL HIGH (ref 0.0–100.0)

## 2022-03-12 LAB — GLUCOSE, CAPILLARY: Glucose-Capillary: 133 mg/dL — ABNORMAL HIGH (ref 70–99)

## 2022-03-12 LAB — VITAMIN D 25 HYDROXY (VIT D DEFICIENCY, FRACTURES): Vit D, 25-Hydroxy: 20.72 ng/mL — ABNORMAL LOW (ref 30–100)

## 2022-03-12 SURGERY — FIXATION, FRACTURE, INTERTROCHANTERIC, WITH INTRAMEDULLARY ROD
Anesthesia: General | Laterality: Right

## 2022-03-12 MED ORDER — FENTANYL CITRATE (PF) 100 MCG/2ML IJ SOLN
INTRAMUSCULAR | Status: AC
  Filled 2022-03-12: qty 2

## 2022-03-12 MED ORDER — GLYCOPYRROLATE 0.2 MG/ML IJ SOLN
INTRAMUSCULAR | Status: DC | PRN
  Administered 2022-03-12: .2 mg via INTRAVENOUS

## 2022-03-12 MED ORDER — OXYCODONE HCL 5 MG/5ML PO SOLN: 5.0000 mg | Freq: Once | ORAL | Status: DC | PRN

## 2022-03-12 MED ORDER — HYDROCODONE-ACETAMINOPHEN 5-325 MG PO TABS
1.0000 | ORAL_TABLET | Freq: Four times a day (QID) | ORAL | Status: DC | PRN
  Administered 2022-03-13 – 2022-03-14 (×2): 2 via ORAL
  Filled 2022-03-12 (×2): qty 2

## 2022-03-12 MED ORDER — ADULT MULTIVITAMIN W/MINERALS CH
1.0000 | ORAL_TABLET | Freq: Every day | ORAL | Status: DC
  Administered 2022-03-13 – 2022-03-14 (×2): 1 via ORAL
  Filled 2022-03-12 (×3): qty 1

## 2022-03-12 MED ORDER — DOCUSATE SODIUM 100 MG PO CAPS
100.0000 mg | ORAL_CAPSULE | Freq: Two times a day (BID) | ORAL | Status: DC
Start: 2022-03-12 — End: 2022-03-12
  Administered 2022-03-12: 100 mg via ORAL
  Filled 2022-03-12: qty 1

## 2022-03-12 MED ORDER — DIPHENHYDRAMINE HCL 12.5 MG/5ML PO ELIX: 12.5000 mg | ORAL_SOLUTION | ORAL | Status: DC | PRN

## 2022-03-12 MED ORDER — FENTANYL CITRATE (PF) 100 MCG/2ML IJ SOLN: 25.0000 ug | INTRAMUSCULAR | Status: DC | PRN

## 2022-03-12 MED ORDER — APIXABAN 5 MG PO TABS
5.0000 mg | ORAL_TABLET | Freq: Two times a day (BID) | ORAL | Status: DC
  Administered 2022-03-13 – 2022-03-14 (×3): 5 mg via ORAL
  Filled 2022-03-12 (×3): qty 1

## 2022-03-12 MED ORDER — PROPOFOL 10 MG/ML IV BOLUS
INTRAVENOUS | Status: AC
  Filled 2022-03-12: qty 20

## 2022-03-12 MED ORDER — ACETAMINOPHEN 325 MG PO TABS: 325.0000 mg | ORAL_TABLET | Freq: Four times a day (QID) | ORAL | Status: DC | PRN

## 2022-03-12 MED ORDER — PHENYLEPHRINE HCL-NACL 20-0.9 MG/250ML-% IV SOLN
INTRAVENOUS | Status: DC | PRN
  Administered 2022-03-12: 160 ug via INTRAVENOUS
  Administered 2022-03-12: 30 ug/min via INTRAVENOUS
  Administered 2022-03-12: 80 ug/min via INTRAVENOUS

## 2022-03-12 MED ORDER — ACETAMINOPHEN 500 MG PO TABS
500.0000 mg | ORAL_TABLET | Freq: Four times a day (QID) | ORAL | Status: AC
  Administered 2022-03-12 – 2022-03-13 (×3): 500 mg via ORAL
  Filled 2022-03-12 (×3): qty 1

## 2022-03-12 MED ORDER — ONDANSETRON HCL 4 MG/2ML IJ SOLN: 4.0000 mg | Freq: Four times a day (QID) | INTRAMUSCULAR | Status: DC | PRN

## 2022-03-12 MED ORDER — TIMOLOL MALEATE 0.5 % OP SOLN
1.0000 [drp] | Freq: Every day | OPHTHALMIC | Status: DC
  Administered 2022-03-12 – 2022-03-14 (×3): 1 [drp] via OPHTHALMIC
  Filled 2022-03-12: qty 5

## 2022-03-12 MED ORDER — APIXABAN 5 MG PO TABS
5.0000 mg | ORAL_TABLET | Freq: Two times a day (BID) | ORAL | Status: DC
Start: 2022-03-12 — End: 2022-03-12

## 2022-03-12 MED ORDER — ROCURONIUM BROMIDE 100 MG/10ML IV SOLN
INTRAVENOUS | Status: DC | PRN
  Administered 2022-03-12: 40 mg via INTRAVENOUS

## 2022-03-12 MED ORDER — METOCLOPRAMIDE HCL 5 MG PO TABS: 5.0000 mg | ORAL_TABLET | Freq: Three times a day (TID) | ORAL | Status: DC | PRN

## 2022-03-12 MED ORDER — SODIUM CHLORIDE 0.9 % IV SOLN
INTRAVENOUS | Status: DC
Start: 2022-03-12 — End: 2022-03-14

## 2022-03-12 MED ORDER — AMIODARONE HCL 200 MG PO TABS
200.0000 mg | ORAL_TABLET | Freq: Every day | ORAL | Status: DC
  Administered 2022-03-12 – 2022-03-14 (×3): 200 mg via ORAL
  Filled 2022-03-12 (×3): qty 1

## 2022-03-12 MED ORDER — 0.9 % SODIUM CHLORIDE (POUR BTL) OPTIME
TOPICAL | Status: DC | PRN
  Administered 2022-03-12: 1000 mL

## 2022-03-12 MED ORDER — SUGAMMADEX SODIUM 200 MG/2ML IV SOLN
INTRAVENOUS | Status: DC | PRN
  Administered 2022-03-12: 200 mg via INTRAVENOUS

## 2022-03-12 MED ORDER — ENSURE ENLIVE PO LIQD
237.0000 mL | Freq: Two times a day (BID) | ORAL | Status: DC
  Administered 2022-03-12 – 2022-03-13 (×2): 237 mL via ORAL
  Filled 2022-03-12 (×5): qty 237

## 2022-03-12 MED ORDER — INSULIN ASPART 100 UNIT/ML IJ SOLN
0.0000 [IU] | Freq: Three times a day (TID) | INTRAMUSCULAR | Status: DC
  Administered 2022-03-13: 1 [IU] via SUBCUTANEOUS
  Administered 2022-03-13 (×2): 2 [IU] via SUBCUTANEOUS
  Administered 2022-03-14: 1 [IU] via SUBCUTANEOUS
  Filled 2022-03-12 (×4): qty 1

## 2022-03-12 MED ORDER — SODIUM CHLORIDE 0.9 % IV SOLN: INTRAVENOUS | Status: DC | PRN

## 2022-03-12 MED ORDER — FLEET ENEMA 7-19 GM/118ML RE ENEM: 1.0000 | ENEMA | Freq: Once | RECTAL | Status: DC | PRN

## 2022-03-12 MED ORDER — CEFAZOLIN SODIUM-DEXTROSE 2-3 GM-%(50ML) IV SOLR
INTRAVENOUS | Status: DC | PRN
  Administered 2022-03-12: 2 g via INTRAVENOUS

## 2022-03-12 MED ORDER — CEFAZOLIN SODIUM-DEXTROSE 2-4 GM/100ML-% IV SOLN
2.0000 g | Freq: Four times a day (QID) | INTRAVENOUS | Status: AC
  Administered 2022-03-12 – 2022-03-13 (×3): 2 g via INTRAVENOUS
  Filled 2022-03-12 (×3): qty 100

## 2022-03-12 MED ORDER — BISACODYL 10 MG RE SUPP: 10.0000 mg | Freq: Every day | RECTAL | Status: DC | PRN

## 2022-03-12 MED ORDER — FUROSEMIDE 10 MG/ML IJ SOLN
40.0000 mg | Freq: Every morning | INTRAMUSCULAR | Status: DC
  Administered 2022-03-12 – 2022-03-13 (×2): 40 mg via INTRAVENOUS
  Filled 2022-03-12 (×2): qty 4

## 2022-03-12 MED ORDER — BUPIVACAINE-EPINEPHRINE (PF) 0.5% -1:200000 IJ SOLN
INTRAMUSCULAR | Status: DC | PRN
  Administered 2022-03-12: 30 mL

## 2022-03-12 MED ORDER — OXYCODONE HCL 5 MG PO TABS: 5.0000 mg | ORAL_TABLET | Freq: Once | ORAL | Status: DC | PRN

## 2022-03-12 MED ORDER — EMPAGLIFLOZIN 25 MG PO TABS
25.0000 mg | ORAL_TABLET | Freq: Every morning | ORAL | Status: DC
  Administered 2022-03-13 – 2022-03-14 (×2): 25 mg via ORAL
  Filled 2022-03-12 (×3): qty 1

## 2022-03-12 MED ORDER — HYDROMORPHONE HCL 1 MG/ML IJ SOLN: 0.5000 mg | INTRAMUSCULAR | Status: DC | PRN

## 2022-03-12 MED ORDER — FENTANYL CITRATE (PF) 100 MCG/2ML IJ SOLN
INTRAMUSCULAR | Status: DC | PRN
  Administered 2022-03-12: 25 ug via INTRAVENOUS
  Administered 2022-03-12: 50 ug via INTRAVENOUS
  Administered 2022-03-12: 25 ug via INTRAVENOUS

## 2022-03-12 MED ORDER — MAGNESIUM HYDROXIDE 400 MG/5ML PO SUSP: 30.0000 mL | Freq: Every day | ORAL | Status: DC | PRN

## 2022-03-12 MED ORDER — SODIUM CHLORIDE 0.9 % IV SOLN: INTRAVENOUS | Status: DC

## 2022-03-12 MED ORDER — ONDANSETRON HCL 4 MG/2ML IJ SOLN: 4.0000 mg | Freq: Once | INTRAMUSCULAR | Status: DC | PRN

## 2022-03-12 MED ORDER — BUPIVACAINE-EPINEPHRINE (PF) 0.5% -1:200000 IJ SOLN
INTRAMUSCULAR | Status: AC
  Filled 2022-03-12: qty 30

## 2022-03-12 MED ORDER — NITROGLYCERIN 0.4 MG SL SUBL: 0.4000 mg | SUBLINGUAL_TABLET | SUBLINGUAL | Status: DC | PRN

## 2022-03-12 MED ORDER — ACETAMINOPHEN 10 MG/ML IV SOLN
INTRAVENOUS | Status: DC | PRN
  Administered 2022-03-12: 1000 mg via INTRAVENOUS

## 2022-03-12 MED ORDER — SACUBITRIL-VALSARTAN 24-26 MG PO TABS
1.0000 | ORAL_TABLET | Freq: Two times a day (BID) | ORAL | Status: DC
  Administered 2022-03-13 – 2022-03-14 (×3): 1 via ORAL
  Filled 2022-03-12 (×3): qty 1

## 2022-03-12 MED ORDER — FUROSEMIDE 10 MG/ML IJ SOLN: 40.0000 mg | Freq: Every day | INTRAMUSCULAR | Status: DC

## 2022-03-12 MED ORDER — ONDANSETRON HCL 4 MG PO TABS: 4.0000 mg | ORAL_TABLET | Freq: Four times a day (QID) | ORAL | Status: DC | PRN

## 2022-03-12 MED ORDER — PANTOPRAZOLE SODIUM 40 MG PO TBEC
40.0000 mg | DELAYED_RELEASE_TABLET | Freq: Every day | ORAL | Status: DC
  Administered 2022-03-12 – 2022-03-14 (×3): 40 mg via ORAL
  Filled 2022-03-12 (×3): qty 1

## 2022-03-12 MED ORDER — METOCLOPRAMIDE HCL 5 MG/ML IJ SOLN: 5.0000 mg | Freq: Three times a day (TID) | INTRAMUSCULAR | Status: DC | PRN

## 2022-03-12 MED ORDER — ETOMIDATE 2 MG/ML IV SOLN
INTRAVENOUS | Status: DC | PRN
  Administered 2022-03-12: 14 mg via INTRAVENOUS

## 2022-03-12 MED ORDER — LIDOCAINE HCL (CARDIAC) PF 100 MG/5ML IV SOSY
PREFILLED_SYRINGE | INTRAVENOUS | Status: DC | PRN
  Administered 2022-03-12: 60 mg via INTRAVENOUS

## 2022-03-12 MED ORDER — SENNOSIDES-DOCUSATE SODIUM 8.6-50 MG PO TABS: 1.0000 | ORAL_TABLET | Freq: Every evening | ORAL | Status: DC | PRN

## 2022-03-12 MED ORDER — DOCUSATE SODIUM 100 MG PO CAPS
100.0000 mg | ORAL_CAPSULE | Freq: Two times a day (BID) | ORAL | Status: DC
  Administered 2022-03-12 – 2022-03-14 (×4): 100 mg via ORAL
  Filled 2022-03-12 (×4): qty 1

## 2022-03-12 MED ORDER — ACETAMINOPHEN 10 MG/ML IV SOLN
1000.0000 mg | Freq: Once | INTRAVENOUS | Status: DC | PRN
Start: 2022-03-12 — End: 2022-03-12

## 2022-03-12 MED ORDER — ONDANSETRON HCL 4 MG/2ML IJ SOLN
INTRAMUSCULAR | Status: DC | PRN
  Administered 2022-03-12: 4 mg via INTRAVENOUS

## 2022-03-12 MED ORDER — LATANOPROST 0.005 % OP SOLN
1.0000 [drp] | Freq: Every day | OPHTHALMIC | Status: DC
  Administered 2022-03-12 – 2022-03-13 (×2): 1 [drp] via OPHTHALMIC
  Filled 2022-03-12: qty 2.5

## 2022-03-12 MED ORDER — CEFAZOLIN SODIUM-DEXTROSE 2-4 GM/100ML-% IV SOLN: 2.0000 g | INTRAVENOUS | Status: DC

## 2022-03-12 MED ORDER — SODIUM CHLORIDE FLUSH 0.9 % IV SOLN
INTRAVENOUS | Status: AC
  Administered 2022-03-12: 10 mL
  Filled 2022-03-12: qty 10

## 2022-03-12 MED ORDER — ACETAMINOPHEN 10 MG/ML IV SOLN
INTRAVENOUS | Status: AC
  Filled 2022-03-12: qty 100

## 2022-03-12 SURGICAL SUPPLY — 45 items
BIT DRILL CROWE POINT TWST 4.3 (DRILL) IMPLANT
BIT DRILL LAG SCREW (DRILL) IMPLANT
BNDG COHESIVE 4X5 TAN ST LF (GAUZE/BANDAGES/DRESSINGS) ×2 IMPLANT
BNDG COHESIVE 6X5 TAN ST LF (GAUZE/BANDAGES/DRESSINGS) ×2 IMPLANT
CHLORAPREP W/TINT 26 (MISCELLANEOUS) ×4 IMPLANT
DRAPE 3/4 80X56 (DRAPES) ×2 IMPLANT
DRAPE C-ARMOR (DRAPES) ×2 IMPLANT
DRILL CROWE POINT TWIST 4.3 (DRILL) ×2
DRILL LAG SCREW (DRILL) ×2
DRSG MEPILEX SACRM 8.7X9.8 (GAUZE/BANDAGES/DRESSINGS) ×1 IMPLANT
DRSG OPSITE POSTOP 3X4 (GAUZE/BANDAGES/DRESSINGS) ×2 IMPLANT
DRSG OPSITE POSTOP 4X6 (GAUZE/BANDAGES/DRESSINGS) ×1 IMPLANT
ELECT CAUTERY BLADE 6.4 (BLADE) ×2 IMPLANT
ELECT REM PT RETURN 9FT ADLT (ELECTROSURGICAL) ×2
ELECTRODE REM PT RTRN 9FT ADLT (ELECTROSURGICAL) ×1 IMPLANT
GAUZE SPONGE 4X4 12PLY STRL (GAUZE/BANDAGES/DRESSINGS) ×2 IMPLANT
GLOVE BIO SURGEON STRL SZ8 (GLOVE) ×4 IMPLANT
GLOVE SURG UNDER LTX SZ8 (GLOVE) ×2 IMPLANT
GOWN STRL REUS W/ TWL LRG LVL3 (GOWN DISPOSABLE) ×1 IMPLANT
GOWN STRL REUS W/ TWL XL LVL3 (GOWN DISPOSABLE) ×1 IMPLANT
GOWN STRL REUS W/TWL LRG LVL3 (GOWN DISPOSABLE) ×2
GOWN STRL REUS W/TWL XL LVL3 (GOWN DISPOSABLE) ×2
GUIDEPIN VERSANAIL DSP 3.2X444 (ORTHOPEDIC DISPOSABLE SUPPLIES) ×1 IMPLANT
GUIDEWIRE BALL NOSE 100CM (WIRE) ×1 IMPLANT
MANIFOLD NEPTUNE II (INSTRUMENTS) ×2 IMPLANT
MAT ABSORB  FLUID 56X50 GRAY (MISCELLANEOUS) ×2
MAT ABSORB FLUID 56X50 GRAY (MISCELLANEOUS) ×1 IMPLANT
NAIL HIP FRAC RT 130 11MX400M (Nail) ×1 IMPLANT
NDL FILTER BLUNT 18X1 1/2 (NEEDLE) ×1 IMPLANT
NEEDLE FILTER BLUNT 18X 1/2SAF (NEEDLE) ×1
NEEDLE FILTER BLUNT 18X1 1/2 (NEEDLE) ×1 IMPLANT
NEEDLE HYPO 22GX1.5 SAFETY (NEEDLE) ×2 IMPLANT
NS IRRIG 500ML POUR BTL (IV SOLUTION) ×2 IMPLANT
PACK HIP COMPR (MISCELLANEOUS) ×2 IMPLANT
SCREW BONE CORTICAL 5.0X50 (Screw) ×1 IMPLANT
SCREW LAG 10.5MMX105MM HFN (Screw) ×1 IMPLANT
STAPLER SKIN PROX 35W (STAPLE) ×2 IMPLANT
STRAP SAFETY 5IN WIDE (MISCELLANEOUS) ×2 IMPLANT
SUT VIC AB 0 CT1 36 (SUTURE) ×2 IMPLANT
SUT VIC AB 1 CT1 36 (SUTURE) ×2 IMPLANT
SUT VIC AB 2-0 CT1 (SUTURE) ×4 IMPLANT
SYR 10ML LL (SYRINGE) ×1 IMPLANT
SYR 30ML LL (SYRINGE) ×2 IMPLANT
TAPE MICROFOAM 4IN (TAPE) ×2 IMPLANT
WATER STERILE IRR 500ML POUR (IV SOLUTION) ×1 IMPLANT

## 2022-03-12 NOTE — Transfer of Care (Signed)
Immediate Anesthesia Transfer of Care Note  Patient: Bradley [name]  Procedure(s) Performed: INTRAMEDULLARY (IM) NAIL INTERTROCHANTRIC (Right)  Patient Location: PACU  Anesthesia Type:General  Level of Consciousness: awake  Airway & Oxygen Therapy: Patient Spontanous Breathing  Post-op Assessment: Report given to RN and Post -op Vital signs reviewed and stable  Post vital signs: Reviewed and stable  Last Vitals:  Vitals Value Taken Time  BP 139/78 03/12/22 1600  Temp 35.9 1600  Pulse 52 03/12/22 1601  Resp 13 03/12/22 1601  SpO2 94 % 03/12/22 1601  Vitals shown include unvalidated device data.  Last Pain:  Vitals:   03/12/22 1333  TempSrc: Oral  PainSc:          Complications: No notable events documented.

## 2022-03-12 NOTE — Anesthesia Preprocedure Evaluation (Signed)
Anesthesia Evaluation  Patient identified by MRN, date of birth, ID band Patient awake    Reviewed: Allergy & Precautions, NPO status , Patient's Chart, lab work & pertinent test results  History of Anesthesia Complications Negative for: history of anesthetic complications  Airway Mallampati: II  TM Distance: >3 FB Neck ROM: Full    Dental  (+) Upper Dentures, Edentulous Lower   Pulmonary neg pulmonary ROS, neg sleep apnea, neg COPD, Patient abstained from smoking.Not current smoker, former smoker,    Pulmonary exam normal breath sounds clear to auscultation       Cardiovascular Exercise Tolerance: Good METShypertension, + CAD, + Past MI, + CABG and +CHF  + dysrhythmias Atrial Fibrillation + Valvular Problems/Murmurs AS  Rhythm:Regular Rate:Normal - Systolic murmurs Per cardiology: "Echo reviewed, showed moderately reduced LVEF, improved compared with 06/2021.  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  Invasive hemodynamics in 06/2021 were also not consistent with severe AS.  Given lack of symptoms, I think it is reasonable for Mr. Arrona to proceed with hip surgery.  Further assessment of potential low-flow:low-gradient severe aortic stenosis can be pursued on an outpatient basis by his primary cardiology team."   Neuro/Psych negative neurological ROS  negative psych ROS   GI/Hepatic neg GERD  ,(+)     (-) substance abuse  ,   Endo/Other  diabetes  Renal/GU negative Renal ROS     Musculoskeletal   Abdominal   Peds  Hematology   Anesthesia Other Findings Past Medical History: No date: (HFpEF) heart failure with preserved ejection fraction (HCC) No date: Coronary artery disease No date: Diabetes mellitus without complication (HCC) No date: Hyperlipidemia No date: Hypertension   Reproductive/Obstetrics                             Anesthesia Physical Anesthesia Plan  ASA: 3  Anesthesia Plan: General   Post-op Pain Management: Ofirmev IV (intra-op)*   Induction: Intravenous  PONV Risk Score and Plan: 3 and Ondansetron, Dexamethasone and Treatment may vary due to age or medical condition  Airway Management Planned: Oral ETT and LMA  Additional Equipment: None  Intra-op Plan:   Post-operative Plan: Extubation in OR  Informed Consent: I have reviewed the patients History and Physical, chart, labs and discussed the procedure including the risks, benefits and alternatives for the proposed anesthesia with the patient or authorized representative who has indicated his/her understanding and acceptance.     Dental advisory given  Plan Discussed with: CRNA and Surgeon  Anesthesia Plan Comments: (Discussed risks of anesthesia with patient, including PONV, sore throat, lip/dental/eye damage. Rare risks discussed as well, such as cardiorespiratory and neurological sequelae, and allergic reactions. Discussed the role of CRNA in patient's perioperative care. Patient understands.)        Anesthesia Quick Evaluation

## 2022-03-12 NOTE — Anesthesia Procedure Notes (Signed)
Procedure Name: Intubation Date/Time: 03/12/2022 2:14 PM  Performed by: Morene Crocker, CRNAPre-anesthesia Checklist: Patient identified, Patient being monitored, Timeout performed, Emergency Drugs available and Suction available Patient Re-evaluated:Patient Re-evaluated prior to induction Oxygen Delivery Method: Circle system utilized Preoxygenation: Pre-oxygenation with 100% oxygen Induction Type: IV induction Ventilation: Mask ventilation without difficulty Laryngoscope Size: 3 and McGraph Grade View: Grade I Tube type: Oral Tube size: 7.0 mm Number of attempts: 1 Airway Equipment and Method: Stylet Placement Confirmation: ETT inserted through vocal cords under direct vision, positive ETCO2 and breath sounds checked- equal and bilateral Secured at: 22 cm Tube secured with: Tape Dental Injury: Teeth and Oropharynx as per pre-operative assessment

## 2022-03-12 NOTE — Anesthesia Postprocedure Evaluation (Signed)
Anesthesia Post Note  Patient: Bradley [name]  Procedure(s) Performed: INTRAMEDULLARY (IM) NAIL INTERTROCHANTRIC (Right)  Patient location during evaluation: PACU Anesthesia Type: General Level of consciousness: awake and alert, oriented and patient cooperative Pain management: pain level controlled Vital Signs Assessment: post-procedure vital signs reviewed and stable Respiratory status: spontaneous breathing, nonlabored ventilation and respiratory function stable Cardiovascular status: blood pressure returned to baseline and stable Postop Assessment: adequate PO intake Anesthetic complications: no   No notable events documented.   Last Vitals:  Vitals:   03/12/22 1615 03/12/22 1630  BP: (!) 147/56   Pulse:  (!) 51  Resp:  13  Temp:  (!) 36.1 C  SpO2:  97%    Last Pain:  Vitals:   03/12/22 1630  TempSrc:   PainSc: 0-No pain                 Reed Breech

## 2022-03-12 NOTE — Consult Note (Signed)
Cardiology Consultation:   Patient ID: Bradley [name] MRN: [id number]; DOB: Dec 10, 1940  Admit date: 03/11/2022 Date of Consult: 03/12/2022  PCP:  System, Provider Not In   [facility] Providers Cardiologist:  Ida Rogue, MD       Patient Profile:   Bradley [name] is a 81 y.o. male with a hx of HFpEF, CAD s/p CABG X 3V 2016, Afib, CKD stage 3, DM2, HLD, and HTN who is being seen 03/12/2022 for the evaluation of pre-operative cardiac clearance at the request of Dr. Marcello Moores.  History of Present Illness:   Bradley [name] is an 81 year old male with a past medical history of HFpEF, CAD s/p CABG X 3V 2016, Afib, CKD stage 3 (baseline Cr ~1.4), DM2, HLD, and HTN.  On 7/17 he was picking his tomatoes when he got tripped up on the curb and fell.  This resulted in a right hip fracture that requires operative fixation. Given his cardiac history, Cardiology was consulted for surgical clearance.   Patient is followed by the [facility] in [place] and was seen in our office in April 2023.  In 2016 he had a CABG x3 at [facility] for his CAD.  In November 2022 he underwent a LHC at [facility] that showed RPDA distal to SVG anastomosis occlusion and was medically managed.  In January 2023 he was hospitalized at [facility] for chest pain and dyspnea where he underwent an Unsuccessful PCI attempt of distal SVG to PL lesion.  In March 2023 he successfully underwent a cardioversion for his atrial fibrillation at the [place].  He takes Eliquis, his last dose was yesterday morning 7/17.   Past Medical History:  Diagnosis Date   (HFpEF) heart failure with preserved ejection fraction ([place])    Coronary artery disease    Diabetes mellitus without complication ([place])    Hyperlipidemia    Hypertension     Past Surgical History:  Procedure Laterality Date   CATARACT EXTRACTION     CHOLECYSTECTOMY     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   [facility]: LIMA-LAD, SVG-OM1, and SVG-rPDA   LEFT HEART CATH AND CORS/GRAFTS ANGIOGRAPHY N/A 07/10/2021    Procedure: LEFT HEART CATH AND CORS/GRAFTS ANGIOGRAPHY;  Surgeon: Nelva Bush, MD;  Location: [facility];  Service: Cardiovascular;  Laterality: N/A;     Home Medications:  Prior to Admission medications   Medication Sig Start Date End Date Taking? Authorizing Provider  acetaminophen (TYLENOL) 325 MG tablet Take 650-1,300 mg by mouth 2 (two) times daily as needed.   Yes [provider]  amiodarone (PACERONE) 200 MG tablet Take 200 mg by mouth daily.   Yes [provider]  apixaban (ELIQUIS) 5 MG TABS tablet Take 1 tablet (5 mg total) by mouth 2 (two) times daily. 08/06/21  Yes Wieting, Richard, MD  Brinzolamide-Brimonidine 1-0.2 % SUSP Place 1 drop into the left eye 3 (three) times daily.   Yes [provider]  carboxymethylcellulose 1 % ophthalmic solution Apply 1 drop to eye at bedtime.   Yes [provider]  empagliflozin (JARDIANCE) 25 MG TABS tablet Take 12.5 mg by mouth every morning.   Yes [provider]  pantoprazole (PROTONIX) 40 MG tablet Take 40 mg by mouth daily.   Yes [provider]  Propylene Glycol 0.6 % SOLN Place 1 drop into both eyes 4 (four) times daily.   Yes [provider]  sacubitril-valsartan (ENTRESTO) 24-26 MG Take 1 tablet by mouth every 12 (twelve) hours. 09/18/21  Yes [provider]  timolol (TIMOPTIC-XR) 0.5 % ophthalmic gel-forming Place 1 drop into both eyes daily. 10/29/20  Yes [provider]  travoprost, benzalkonium, (TRAVATAN) 0.004 % ophthalmic solution Place 1 drop into both eyes nightly.   Yes [provider]  vitamin B-12 (CYANOCOBALAMIN) 500 MCG tablet Take 1,000 mcg by mouth daily. Before breakfast   Yes [provider]  atorvastatin (LIPITOR) 80 MG tablet Take 40 mg by mouth daily. Patient not taking: Reported on 03/12/2022    [provider]  clopidogrel (PLAVIX) 75 MG tablet Take 1 tablet (75 mg total) by mouth daily with  breakfast. Patient not taking: Reported on 03/12/2022 07/12/21   Nicole Kindred A, DO  diclofenac (VOLTAREN) 75 MG EC tablet Take by mouth. Patient not taking: Reported on 03/12/2022    [provider]  furosemide (LASIX) 40 MG tablet Take 1 tablet (40 mg total) by mouth daily. Patient not taking: Reported on 03/12/2022 07/12/21   Nicole Kindred A, DO  glipiZIDE (GLUCOTROL) 5 MG tablet Take by mouth. Patient not taking: Reported on 03/12/2022    [provider]  indomethacin (INDOCIN) 50 MG capsule Take by mouth. Patient not taking: Reported on 03/12/2022    [provider]  isosorbide mononitrate (IMDUR) 60 MG 24 hr tablet Take 1 tablet (60 mg total) by mouth daily. Patient not taking: Reported on 03/12/2022 08/07/21   Loletha Grayer, MD  meloxicam (MOBIC) 7.5 MG tablet Mobic 7.5 mg tablet  Take 1 tablet twice a day by oral route. Patient not taking: Reported on 03/12/2022    [provider]  metoprolol succinate (TOPROL-XL) 50 MG 24 hr tablet Take by mouth. Patient not taking: Reported on 03/12/2022 09/19/21 09/19/22  [provider]  nitroGLYCERIN (NITROSTAT) 0.4 MG SL tablet Place 1 tablet (0.4 mg total) under the tongue every 5 (five) minutes x 3 doses as needed for chest pain. 07/11/21   Ezekiel Slocumb, DO  spironolactone (ALDACTONE) 25 MG tablet Take 1 tablet by mouth daily. Patient not taking: Reported on 03/12/2022 10/18/21   [provider]  traMADol (ULTRAM) 50 MG tablet tramadol 50 mg tablet  Take 1 tablet every 6 hours by oral route. Patient not taking: Reported on 03/12/2022    [provider]  valsartan (DIOVAN) 40 MG tablet Take by mouth. Patient not taking: Reported on 03/12/2022 09/19/21 09/19/22  [provider]    Inpatient Medications: Scheduled Meds:  amiodarone  200 mg Oral Daily   docusate sodium  100 mg Oral BID   empagliflozin  25 mg Oral q morning   furosemide  40 mg Intravenous q morning    insulin aspart  0-6 Units Subcutaneous TID WC   pantoprazole  40 mg Oral Daily   [START ON 03/13/2022] sacubitril-valsartan  1 tablet Oral Q12H   Continuous Infusions:   ceFAZolin (ANCEF) IV     PRN Meds: HYDROcodone-acetaminophen, HYDROmorphone (DILAUDID) injection, nitroGLYCERIN, senna-docusate  Allergies:    Allergies  Allergen Reactions   Simvastatin     Social History:   Social History   Socioeconomic History   Marital status: Widowed    Spouse name: Not on file   Number of children: Not on file   Years of education: Not on file   Highest education level: Not on file  Occupational History   Not on file  Tobacco Use   Smoking status: Former   Smokeless tobacco: Never  Vaping Use   Vaping Use: Never used  Substance and Sexual Activity   Alcohol use:  Never   Drug use: Never   Sexual activity: Not on file  Other Topics Concern   Not on file  Social History Narrative   Not on file   Social Determinants of Health   Financial Resource Strain: Not on file  Food Insecurity: Not on file  Transportation Needs: Not on file  Physical Activity: Not on file  Stress: Not on file  Social Connections: Not on file  Intimate Partner Violence: Not on file    Family History:    Family History  Problem Relation Age of Onset   CAD Mother    Urinary tract infection Father    Hip fracture Father    Cancer Sister    Cancer Brother      ROS:  Please see the history of present illness.  Positive for right hip pain. All other ROS reviewed and negative.     Physical Exam/Data:   Vitals:   03/12/22 0400 03/12/22 0630 03/12/22 0800 03/12/22 0858  BP: (!) 170/50 (!) 168/48 (!) 147/42 (!) 129/57  Pulse: (!) 52 (!) 53 (!) 45 (!) 47  Resp: 12 16 12 18   Temp:    98.7 F (37.1 C)  TempSrc:    Oral  SpO2: 97% 95% 98% 97%  Weight:      Height:       No intake or output data in the 24 hours ending 03/12/22 1110    03/11/2022    9:30 PM 03/11/2022    9:02 PM 12/10/2021    10:44 AM  Last 3 Weights  Weight (lbs) 180 lb 180 lb 184 lb 6 oz  Weight (kg) 81.647 kg 81.647 kg 83.632 kg     Body mass index is 24.41 kg/m.  General:  Elderly white male, in no acute distress HEENT: normal Neck: no JVD Vascular: No carotid bruits; Distal pulses 2+ bilaterally Cardiac:  normal S1, S2; RRR; no murmur  Lungs:  clear to auscultation bilaterally, no wheezing, rhonchi or rales  Abd: soft, nontender, no hepatomegaly  Ext: no edema Musculoskeletal:  RLE pain and deformity with decreased ROM and strength, rest of exam without abnormalities Skin: warm and dry  Neuro:  CNs 2-12 intact, no focal abnormalities noted Psych:  Normal affect   EKG:  The EKG was personally reviewed and demonstrates:  Sinus Bradycardia, rate of 47, LVH Telemetry:  Telemetry was personally reviewed and demonstrates:  Sinus brady to Sinus Rhythm, Rate of 50-60s  Relevant CV Studies:  Echo 06/2021: 1. Left ventricular ejection fraction, by estimation, is 30 to 35%. The  left ventricle has moderately decreased function. The left ventricle  demonstrates regional wall motion abnormalities (see scoring  diagram/findings for description). There is mild  left ventricular hypertrophy. Left ventricular diastolic parameters are  consistent with Grade II diastolic dysfunction (pseudonormalization).  Elevated left atrial pressure. There is severe hypokinesis of the left  ventricular, entire anteroseptal wall,  anterior wall and anterolateral wall.   2. Right ventricular systolic function is normal. The right ventricular  size is normal.   3. The mitral valve is degenerative. Moderate mitral valve regurgitation.  No evidence of mitral stenosis.   4. The aortic valve has an indeterminant number of cusps. There is  moderate calcification of the aortic valve. There is severe thickening of  the aortic valve. Aortic valve regurgitation is mild to moderate. Mild to  moderate aortic valve stenosis.   5. There  is mild dilatation of the ascending aorta, measuring 38 mm.   6. The  inferior vena cava is normal in size with <50% respiratory  variability, suggesting right atrial pressure of 8 mmHg.   Left Heart Cath 06/2021: Severe native coronary artery disease with 90% distal LMCA stenosis, diffuse proximal LAD disease with functional mid vessel occlusion with competitive flow from LIMA-LAD, 99% proximal OM1 stenosis, and CTO of proximal RCA. Widely patent LIMA-LAD and SVG-OM3. Patent SVG-RPDA with 70% stenosis near distal anastomosis.  RPDA distal to SVG anastomosis is occluded (? culprit lesion for NSTEMI). Mildly elevated left ventricular filling pressure (LVEDP 15-20 mmHg). Mild-moderate aortic valve stenosis (peak-to-peak gradient ~20 mmHg).  Laboratory Data:  High Sensitivity Troponin:   Recent Labs  Lab 03/12/22 0144 03/12/22 0418  TROPONINIHS 10 10     Chemistry Recent Labs  Lab 03/11/22 2322 03/12/22 0418  NA 140 139  K 5.1 4.6  CL 108 111  CO2 25 23  GLUCOSE 201* 143*  BUN 40* 34*  CREATININE 1.79* 1.46*  CALCIUM 8.8* 8.3*  GFRNONAA 38* 48*  ANIONGAP 7 5    No results for input(s): "PROT", "ALBUMIN", "AST", "ALT", "ALKPHOS", "BILITOT" in the last 168 hours. Lipids No results for input(s): "CHOL", "TRIG", "HDL", "LABVLDL", "LDLCALC", "CHOLHDL" in the last 168 hours.  Hematology Recent Labs  Lab 03/11/22 2322 03/12/22 0418  WBC 12.3* 11.2*  RBC 3.98* 3.88*  HGB 12.7* 12.4*  HCT 41.2 40.0  MCV 103.5* 103.1*  MCH 31.9 32.0  MCHC 30.8 31.0  RDW 14.5 14.6  PLT 189 173   Thyroid No results for input(s): "TSH", "FREET4" in the last 168 hours.  BNP Recent Labs  Lab 03/12/22 0144  BNP 739.8*    DDimer No results for input(s): "DDIMER" in the last 168 hours.   Radiology/Studies:  Chest Portable 1 View  Result Date: 03/12/2022 CLINICAL DATA:  Hip fracture. EXAM: PORTABLE CHEST 1 VIEW COMPARISON:  08/05/2021 FINDINGS: Post median sternotomy. Stable mild  cardiomegaly. Unchanged mediastinal contours. Aortic atherosclerosis. Mild vascular congestion. Chronic interstitial coarsening. No focal airspace disease, pleural effusion or pneumothorax. No acute osseous abnormalities are seen. IMPRESSION: Stable mild cardiomegaly.  Mild vascular congestion. Electronically Signed   By: Keith Rake M.D.   On: 03/12/2022 01:42   CT HEAD WO CONTRAST (5MM)  Result Date: 03/12/2022 CLINICAL DATA:  Head trauma. EXAM: CT HEAD WITHOUT CONTRAST TECHNIQUE: Contiguous axial images were obtained from the base of the skull through the vertex without intravenous contrast. RADIATION DOSE REDUCTION: This exam was performed according to the departmental dose-optimization program which includes automated exposure control, adjustment of the mA and/or kV according to patient size and/or use of iterative reconstruction technique. COMPARISON:  Head CT dated 06/08/2006. FINDINGS: Brain: Mild age-related atrophy and chronic microvascular ischemic changes. 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 There is no acute intracranial hemorrhage. No mass effect or midline shift. No extra-axial fluid collection. Vascular: No hyperdense vessel or unexpected calcification. Skull: Normal. Negative for fracture or focal lesion. Sinuses/Orbits: No acute finding. Other: None IMPRESSION: 1. No acute intracranial pathology. 2. Mild age-related atrophy and chronic microvascular ischemic changes. Electronically Signed   By: Anner Crete M.D.   On: 03/12/2022 01:25   DG Hip Unilat With Pelvis 2-3 Views Right  Result Date: 03/11/2022 CLINICAL DATA:  [place], right hip injury EXAM: DG HIP (WITH OR WITHOUT PELVIS) 2-3V RIGHT COMPARISON:  None Available. FINDINGS: Frontal view of the pelvis as well as frontal and frogleg lateral views of the right hip are obtained. There is a minimally displaced intertrochanteric right hip fracture  with anatomic alignment. Symmetrical bilateral hip osteoarthritis.  No subluxation or dislocation. Diffuse atherosclerosis. IMPRESSION: 1. Minimally displaced intertrochanteric right hip fracture, with anatomic alignment. Electronically Signed   By: Sharlet Salina M.D.   On: 03/11/2022 22:20     Assessment and Plan:   Acute on chronic systolic and diastolic heart failure - Maintaining normal sinus rhythm, on Lasix 40 daily - Ejection fraction 30 to 35% on echo November 2022 - BNP 739.8, down from January when it was 6,866 - Continue Jardiance, Entresto - Continue Lasix 40 mg  - Daily weights - Strict I&Os - Vital signs per unit protocol   2.   CAD s/p CABG - Denies chest pain - Holding Plavix for surgery, restart per medicine management - Continue Imdur  3.   Hypertension - Continue Toprol-XL - Continue Imdur  4.   Hyperlipidemia - Continue Lipitor - LDL 31 08/06/21   5.   Atrial fibrillation, persistent - Currently in Sinus Rhythm s/p cardioversion in March - Continue amiodarone 200 mg daily - Hold Eliquis for surgery, restart per Ortho management - CHADS2 VASC score of 6.  - Continue Toprol- XL  6.   CKD - Stable renal function - At baseline 1.46 creatinine - Avoid nephrotoxins   7.   DM2 - A1C 8.6 11/22 - Management per IM   Revised cardiac risk index is 2 points with 10.1% 30-day risk of death MI or cardiac arrest.  Patient would be considered moderate risk for surgery considering his cardiac history and age.  Discussed risk and benefits from the cardiac standpoint as well as alternatives.   Risk Assessment/Risk Scores:        New York Heart Association (NYHA) Functional Class NYHA Class II  CHA2DS2-VASc Score = 6   This indicates a 9.7% annual risk of stroke. The patient's score is based upon: CHF History: 1 HTN History: 1 Diabetes History: 1 Stroke History: 0 Vascular Disease History: 1 Age Score: 2 Gender Score: 0         For questions or updates, please contact [facility] Please consult [email or web address]  for contact info under    Signed, Caera Enwright, NP  03/12/2022 11:10 AM

## 2022-03-12 NOTE — Progress Notes (Signed)
PT Cancellation Note  Patient Details Name: Bradley [name] MRN: [id number] DOB: [birth year]   Cancelled Treatment:    Reason Eval/Treat Not Completed: Medical issues which prohibited therapy (Consult received and chart reviewed.  Patient noted with acute fracture to R hip; pending surgical repair. Per guidelines, will require new PT/OT orders post-surgery.  Will complete initial order; please reconsult post-repair as medically appropriate.))  Maxmilian Trostel H. Manson Passey, PT, DPT, NCS 03/12/22, 7:19 AM [phone]

## 2022-03-12 NOTE — Progress Notes (Signed)
Initial Nutrition Assessment  DOCUMENTATION CODES:   Not applicable  INTERVENTION:   Once diet is advanced, add:  -MVI with minerals daily -Ensure Enlive po BID, each supplement provides 350 kcal and 20 grams of protein.   NUTRITION DIAGNOSIS:   Increased nutrient needs related to post-op healing as evidenced by estimated needs.  GOAL:   Patient will meet greater than or equal to 90% of their needs  MONITOR:   PO intake, Supplement acceptance, Diet advancement  REASON FOR ASSESSMENT:   Consult Assessment of nutrition requirement/status, Hip fracture protocol  ASSESSMENT:   Pt with medical history significant for  Hfref(30-35%),moderate AS,Afib on AC, CAD s/p CABGTx 3V,on Plavix, CKDIII, DMII, HLD, HTN ,  gout who presented s/p mechanical fall onto right hip  Pt admitted with rt hip fracture s/p fall.   Reviewed I/O's: -450 ml x 24 hours  UOP: 600 ml x 24 hours   Pt unavailable at time of visit. RD unable to obtain further nutrition-related history or complete nutrition-focused physical exam at this time.    Per orthopedics notes, plan for intramedullary nailing today. Pt currently NPO for procedure.   Reviewed wt hx; pt has experienced a 2.4% wt loss over the past 3 months, which is not significant for time frame.   Pt with increased nutritional needs for support post-op healing and would benefit from addition of oral nutrition supplements.   Medications reviewed and include colace and lasix.   Lab Results  Component Value Date   HGBA1C 7.8 (H) 03/12/2022   PTA DM medications are 5 mg glipizide daily.   Labs reviewed: CBGS: 130-142 (inpatient orders for glycemic control are 0-6 units insulin aspart TID with meals).    Diet Order:   Diet Order             Diet NPO time specified Except for: Sips with Meds  Diet effective now                   EDUCATION NEEDS:   No education needs have been identified at this time  Skin:  Skin Assessment: Skin  Integrity Issues: Skin Integrity Issues:: Incisions Incisions: closed rt hip  Last BM:  03/11/22  Height:   Ht Readings from Last 1 Encounters:  03/11/22 6' (1.829 m)    Weight:   Wt Readings from Last 1 Encounters:  03/11/22 81.6 kg    Ideal Body Weight:  80.9 kg  BMI:  Body mass index is 24.41 kg/m.  Estimated Nutritional Needs:   Kcal:  2000-2200  Protein:  105-120 grams  Fluid:  > 2 L    Levada Schilling, RD, LDN, CDCES Registered Dietitian II Certified Diabetes Care and Education Specialist Please refer to [facility] for RD and/or RD on-call/weekend/after hours pager

## 2022-03-12 NOTE — ED Notes (Signed)
Report given to Jun, RN at this time.

## 2022-03-12 NOTE — ED Notes (Addendum)
DP and PT pulses doppler obtained to right foot. Cap refill <3 seconds. Sensation intact. Able to wiggle toes.

## 2022-03-12 NOTE — Progress Notes (Signed)
Same day as admission rounding note.  81 y.o. 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 Per patient he was out tending to his plan when he tripped on wet uneven ground and fell landing on his right hip.  Evaluation in the ED revealed a minimally displaced RIGHT intertrochanteric hip fracture.  Ortho was consulted and plan to take pt to OR today. Cardiology consulted and cleared pt for surgery after echo today.     Assessment & Plan: I have reviewed the A&P as outline in H&P by Dr. Maisie Fus and agree with any changes or additions as below.  --Follow up ortho and cardiology recommendations --Resume Eliquis tomorrow if okay with ortho and Hbg stable    No charge

## 2022-03-12 NOTE — H&P (Addendum)
History and Physical    Bradley [name] [id number] DOB: 1941-02-22 DOA: 03/11/2022  PCP: System, Provider Not In  Patient coming from: home  I have personally briefly reviewed patient's old medical records in [place]  Chief Complaint: right hip pain  HPI: Bradley [name] is a 81 y.o. 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 Per patient he was out tending to his plan when he tripped on wet uneven ground and fell landing on his right hip. Patient states prior to the fall he was in his normal state of health. He denies any sob/chest/pain /n/v/dysuria or diarrhea / fever or chills.   ED Course:  Vitals Temp 99.2, BP 163/48, hr 48, sat 98% on ra   Hip xray IMPRESSION: 1. Minimally displaced intertrochanteric right hip fracture, with anatomic alignment.   Labs: Wbc 12.3, hgb 12.7 mcv 103, plt 189, increase pmn  Inr :1.2 NA 140, K5.1, glu 201, cr 1.79 up from 1.44  EKG: sinus bradycardia ,LVH no hyperacute st changes  Cxr: mild vascular congestion   Tx  zofran , fentanyl iv x 1 Review of Systems: As per HPI otherwise 10 point review of systems negative.   Past Medical History:  Diagnosis Date   (HFpEF) heart failure with preserved ejection fraction ([place])    Coronary artery disease    Diabetes mellitus without complication ([place])    Hyperlipidemia    Hypertension     Past Surgical History:  Procedure Laterality Date   CATARACT EXTRACTION     CHOLECYSTECTOMY     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   [facility]: LIMA-LAD, SVG-OM1, and SVG-rPDA   LEFT HEART CATH AND CORS/GRAFTS ANGIOGRAPHY N/A 07/10/2021   Procedure: LEFT HEART CATH AND CORS/GRAFTS ANGIOGRAPHY;  Surgeon: Nelva Bush, MD;  Location: [facility];  Service: Cardiovascular;  Laterality: N/A;     reports that he has quit smoking. He has never  used smokeless tobacco. He reports that he does not drink alcohol and does not use drugs.  Allergies  Allergen Reactions   Simvastatin     Family History  Problem Relation Age of Onset   CAD Mother    Urinary tract infection Father    Hip fracture Father    Cancer Sister    Cancer Brother     Prior to Admission medications   Medication Sig Start Date End Date Taking? Authorizing Provider  acetaminophen (TYLENOL) 325 MG tablet Take 650-1,300 mg by mouth 2 (two) times daily as needed.   Yes [provider]  amiodarone (PACERONE) 200 MG tablet Take 200 mg by mouth daily.   Yes [provider]  apixaban (ELIQUIS) 5 MG TABS tablet Take 1 tablet (5 mg total) by mouth 2 (two) times daily. 08/06/21  Yes Wieting, Richard, MD  Brinzolamide-Brimonidine 1-0.2 % SUSP Place 1 drop into the left eye 3 (three) times daily.   Yes [provider]  carboxymethylcellulose 1 % ophthalmic solution Apply 1 drop to eye at bedtime.   Yes [provider]  empagliflozin (JARDIANCE) 25 MG TABS tablet Take 12.5 mg by mouth every morning.   Yes [provider]  pantoprazole (PROTONIX) 40 MG tablet Take 40 mg by mouth daily.   Yes [provider]  Propylene Glycol 0.6 % SOLN Place 1 drop into both eyes 4 (four) times daily.   Yes  [provider]  sacubitril-valsartan (ENTRESTO) 24-26 MG Take 1 tablet by mouth every 12 (twelve) hours. 09/18/21  Yes [provider]  timolol (TIMOPTIC-XR) 0.5 % ophthalmic gel-forming Place 1 drop into both eyes daily. 10/29/20  Yes [provider]  travoprost, benzalkonium, (TRAVATAN) 0.004 % ophthalmic solution Place 1 drop into both eyes nightly.   Yes [provider]  vitamin B-12 (CYANOCOBALAMIN) 500 MCG tablet Take 1,000 mcg by mouth daily. Before breakfast   Yes [provider]  atorvastatin (LIPITOR) 80 MG tablet Take 40 mg by mouth daily. Patient not taking: Reported on 03/12/2022     [provider]  clopidogrel (PLAVIX) 75 MG tablet Take 1 tablet (75 mg total) by mouth daily with breakfast. Patient not taking: Reported on 03/12/2022 07/12/21   Esaw Grandchild A, DO  diclofenac (VOLTAREN) 75 MG EC tablet Take by mouth. Patient not taking: Reported on 03/12/2022    [provider]  furosemide (LASIX) 40 MG tablet Take 1 tablet (40 mg total) by mouth daily. Patient not taking: Reported on 03/12/2022 07/12/21   Esaw Grandchild A, DO  glipiZIDE (GLUCOTROL) 5 MG tablet Take by mouth. Patient not taking: Reported on 03/12/2022    [provider]  indomethacin (INDOCIN) 50 MG capsule Take by mouth. Patient not taking: Reported on 03/12/2022    [provider]  isosorbide mononitrate (IMDUR) 60 MG 24 hr tablet Take 1 tablet (60 mg total) by mouth daily. Patient not taking: Reported on 03/12/2022 08/07/21   Alford Highland, MD  meloxicam (MOBIC) 7.5 MG tablet Mobic 7.5 mg tablet  Take 1 tablet twice a day by oral route. Patient not taking: Reported on 03/12/2022    [provider]  metoprolol succinate (TOPROL-XL) 50 MG 24 hr tablet Take by mouth. Patient not taking: Reported on 03/12/2022 09/19/21 09/19/22  [provider]  nitroGLYCERIN (NITROSTAT) 0.4 MG SL tablet Place 1 tablet (0.4 mg total) under the tongue every 5 (five) minutes x 3 doses as needed for chest pain. 07/11/21   Pennie Banter, DO  spironolactone (ALDACTONE) 25 MG tablet Take 1 tablet by mouth daily. Patient not taking: Reported on 03/12/2022 10/18/21   [provider]  traMADol (ULTRAM) 50 MG tablet tramadol 50 mg tablet  Take 1 tablet every 6 hours by oral route. Patient not taking: Reported on 03/12/2022    [provider]  valsartan (DIOVAN) 40 MG tablet Take by mouth. Patient not taking: Reported on 03/12/2022 09/19/21 09/19/22  [provider]    Physical Exam: Vitals:   03/11/22 2102 03/11/22 2103 03/11/22 2130 03/11/22 2345  BP:   (!) 163/48  (!) 169/55  Pulse:  (!) 48    Resp:    12  Temp:  99.2 F (37.3 C)    TempSrc:  Oral    SpO2:  98%  98%  Weight: 81.6 kg  81.6 kg   Height: 6' (1.829 m)  6' (1.829 m)     Vitals:   03/11/22 2102 03/11/22 2103 03/11/22 2130 03/11/22 2345  BP:  (!) 163/48  (!) 169/55  Pulse:  (!) 48    Resp:    12  Temp:  99.2 F (37.3 C)    TempSrc:  Oral    SpO2:  98%  98%  Weight: 81.6 kg  81.6 kg   Height: 6' (1.829 m)  6' (1.829 m)    Constitutional: NAD, calm, comfortable Eyes: PERRL, lids and conjunctivae normal ENMT: Mucous membranes are moist. Posterior pharynx  clear of any exudate or lesions.Normal dentition.  Neck: normal, supple, no masses, no thyromegaly Respiratory: clear to auscultation bilaterally, no wheezing, no crackles. Normal respiratory effort. No accessory muscle use.  Cardiovascular: Regular rate and rhythm, + murmurs / rubs / gallops. No extremity edema. Warm , + doppler pulses  Abdomen: no tenderness, no masses palpated. No hepatosplenomegaly. Bowel sounds positive.  Musculoskeletal: no clubbing / cyanosis.right hip fx, Normal muscle tone.  Skin: no rashes, lesions, ulcers. No induration Neurologic: CN 2-12 grossly intact. Sensation intact,  Strength 5/5 in all 4. Except right leg Psychiatric: Normal judgment and insight. Alert and oriented x 3. Normal mood.    Labs on Admission: I have personally reviewed following labs and imaging studies  CBC: Recent Labs  Lab 03/11/22 2322  WBC 12.3*  NEUTROABS 9.5*  HGB 12.7*  HCT 41.2  MCV 103.5*  PLT 99991111   Basic Metabolic Panel: Recent Labs  Lab 03/11/22 2322  NA 140  K 5.1  CL 108  CO2 25  GLUCOSE 201*  BUN 40*  CREATININE 1.79*  CALCIUM 8.8*   GFR: Estimated Creatinine Clearance: 36.1 mL/min (A) (by C-G formula based on SCr of 1.79 mg/dL (H)). Liver Function Tests: No results for input(s): "AST", "ALT", "ALKPHOS", "BILITOT", "PROT", "ALBUMIN" in the last 168 hours. No results for input(s):  "LIPASE", "AMYLASE" in the last 168 hours. No results for input(s): "AMMONIA" in the last 168 hours. Coagulation Profile: Recent Labs  Lab 03/11/22 2322  INR 1.2   Cardiac Enzymes: No results for input(s): "CKTOTAL", "CKMB", "CKMBINDEX", "TROPONINI" in the last 168 hours. BNP (last 3 results) No results for input(s): "PROBNP" in the last 8760 hours. HbA1C: No results for input(s): "HGBA1C" in the last 72 hours. CBG: No results for input(s): "GLUCAP" in the last 168 hours. Lipid Profile: No results for input(s): "CHOL", "HDL", "LDLCALC", "TRIG", "CHOLHDL", "LDLDIRECT" in the last 72 hours. Thyroid Function Tests: No results for input(s): "TSH", "T4TOTAL", "FREET4", "T3FREE", "THYROIDAB" in the last 72 hours. Anemia Panel: No results for input(s): "VITAMINB12", "FOLATE", "FERRITIN", "TIBC", "IRON", "RETICCTPCT" in the last 72 hours. Urine analysis: No results found for: "COLORURINE", "APPEARANCEUR", "LABSPEC", "PHURINE", "GLUCOSEU", "HGBUR", "BILIRUBINUR", "KETONESUR", "PROTEINUR", "UROBILINOGEN", "NITRITE", "LEUKOCYTESUR"  Radiological Exams on Admission: DG Hip Unilat With Pelvis 2-3 Views Right  Result Date: 03/11/2022 CLINICAL DATA:  [place], right hip injury EXAM: DG HIP (WITH OR WITHOUT PELVIS) 2-3V RIGHT COMPARISON:  None Available. FINDINGS: Frontal view of the pelvis as well as frontal and frogleg lateral views of the right hip are obtained. There is a minimally displaced intertrochanteric right hip fracture with anatomic alignment. Symmetrical bilateral hip osteoarthritis. No subluxation or dislocation. Diffuse atherosclerosis. IMPRESSION: 1. Minimally displaced intertrochanteric right hip fracture, with anatomic alignment. Electronically Signed   By: Randa Ngo M.D.   On: 03/11/2022 22:20    EKG: Independently reviewed. See above  Assessment/Plan  Right Hip fx  -admit to tele  -place on hip fracture protocol  -ortho consult [facility] awaiting further recs -supportive with  pain medications  -patient with noted cardiac history will consult cardiology for pre-op clearance as requested by Ortho. Pre op calculated risk is high  Class IV based on noted comorbid conditions. Cardiology to leave final recs required optimization / any needed risk evaluation prior to surgery.  BNP  ordered   Acute on Chronic systolic/diastolic heart failure  -last ed 30-35%  -continue on GDMT -start on lasix iv daily, with prn as needed  -patient does not appear overloaded on exam  --  BNP elevated 700's from prior 500's , cxr with vascular congestion  CADs/p CABG Hx stable angina  -continue GDMT -of note patient metoprolol on hold  unclear why this is ,last cardiology notes rec continues use on 4/23 -holding plavix due to plan for OR time - statin on hold as well per med rec  check lfts -continue imdur -awaiting cardiology final recs   PVD s/p R SFA occlusion -on plavix and AC  -on hold currently pending surgery   Chronic Afib  -continue on amiodarone - eliquis on hold   AKI on CKDIII -baseline 1.4 now 1.79  - due to mild chf  poor flow   -monitor labs  on diuresis  DMII -hold oral hypoglycemic -place on is/fs  - npo for possible surgery in am    DVT prophylaxis: on full dose AC,  s/p surgery, resuming AC per ortho recs Code Status: FULL Family Communication: non at bedside Disposition Plan: patient  expected to be admitted greater than 2 midnights Consults called: cardiology  Admission status: cardiac tele   Lurline Del MD [facility]   If 7PM-7AM, please contact night-coverage [email or web address] Password [facility]  03/12/2022, 12:23 AM

## 2022-03-12 NOTE — Progress Notes (Signed)
Admission profile updated. ?

## 2022-03-12 NOTE — Op Note (Addendum)
03/12/2022  4:04 PM  Patient:   Bradley [name]  Pre-Op Diagnosis:   Mildly displaced intertrochanteric fracture, right hip.  Post-Op Diagnosis:   Same  Procedure:   Reduction and internal fixation of displaced intertrochanteric right hip fracture with Biomet Affixis TFN nail.  Surgeon:   Maryagnes Amos, MD  Assistant:   Justice Deeds, PA-S  Anesthesia:   GET  Findings:   As above  Complications:   None  EBL:   100 cc  Fluids:   500 cc crystalloid  UOP:   None  TT:   None  Drains:   None  Closure:   Staples  Implants:   Biomet Affixis 11 x 400 mm TFN with a 105 mm lag screw and a 50 mm distal interlocking screw  Brief Clinical Note:   The patient is an 81 year old male who sustained the above-noted injury yesterday afternoon when he apparently tripped and fell in his garage. He was brought to the emergency room where x-rays demonstrated the above-noted injury. The patient has been cleared medically and presents at this time for reduction and internal fixation of the mildly displaced intertrochanteric right hip fracture.  Procedure:   The patient was brought into the operating room and lain in the supine position.  After adequate general endotracheal intubation and anesthesia were obtained, the patient was repositioned on the fracture table. The uninjured leg was placed in a flexed and abducted position while the injured lower extremity was placed in longitudinal traction. The fracture was reduced using longitudinal traction and internal rotation. The adequacy of reduction was verified fluoroscopically in AP and lateral projections and found to be near anatomic. The lateral aspects of the right hip and thigh were prepped with ChloraPrep solution before being draped sterilely. Preoperative antibiotics were administered. A timeout was performed to verify the appropriate surgical site.   The greater trochanter was identified fluoroscopically and an approximately 4-5 cm incision made  about 2-3 fingerbreadths above the tip of the greater trochanter. The incision was carried down through the subcutaneous tissues to expose the gluteal fascia. This was split the length of the incision, providing access to the tip of the trochanter. Under fluoroscopic guidance, a guidewire was drilled through the tip of the trochanter into the proximal metaphysis to the level of the lesser trochanter. After verifying its position fluoroscopically in AP and lateral projections, it was overreamed with the initial reamer to the depth of the lesser trochanter. A guidewire was passed down through the femoral canal to the supracondylar region. The adequacy of guidewire position was verified fluoroscopically in AP and lateral projections before the length of the guidewire within the canal was measured and found to be 410 mm. Therefore, a 400 mm length nail was selected. The guidewire was overreamed sequentially using the flexible reamers, beginning with a 10.5 mm reamer and progressing to a 12.5 mm reamer. This provided good cortical chatter. The 11 x 400 mm Biomet Affixis TFN rod was selected and advanced to the appropriate depth, as verified fluoroscopically.   The guide system for the lag screw was positioned and advanced through an approximately 2 cm stab incision over the lateral aspect of the proximal femur. The guidewire was drilled up through the trochanteric femoral nail and into the femoral neck to rest within 5 mm of subchondral bone. After verifying its position in the femoral neck and head in both AP and lateral projections, the guidewire was measured and found to be optimally replicated by a 105 mm lag  screw. The guidewire was overreamed to the appropriate depth before the lag screw was inserted and advanced to the appropriate depth as verified fluoroscopically in AP and lateral projections. The locking screw was advanced, then backed off a quarter turn to set the lag screw. Again the adequacy of hardware  position and fracture reduction was verified fluoroscopically in AP and lateral projections and found to be excellent.  Attention was directed distally. Using the "perfect circle" technique, the leg and fluoroscopy machine were positioned appropriately. An approximately 1.5 cm stab incision was made over the skin at the appropriate point before the drill bit was advanced through the cortex and across the static hole of the nail. The appropriate length of the screw was determined before the 50 mm distal interlocking screw was positioned, then advanced and tightened securely. Again the adequacy of screw position was verified fluoroscopically in AP and lateral projections and found to be excellent.  The wounds were irrigated thoroughly with sterile saline solution before the abductor fascia was reapproximated using #1 Vicryl interrupted sutures. The subcutaneous tissues were closed using 2-0 Vicryl interrupted sutures. The skin was closed using staples. A total of 30 cc of 0.5% Sensorcaine with epinephrine was injected in and around all incisions. Sterile occlusive dressings were applied to all wounds before the patient was awakened, extubated, and returned to the recovery room in satisfactory condition after tolerating the procedure well.

## 2022-03-12 NOTE — Consult Note (Signed)
ORTHOPAEDIC CONSULTATION  REQUESTING PHYSICIAN: Pennie Banter, DO  Chief Complaint:   Right hip pain.  History of Present Illness: Bradley [name] is a 81 y.o. male with a history of coronary artery disease status post CABG x3, chronic atrial fibrillation requiring anticoagulation, diabetes, hyperlipidemia, hypertension, and congestive heart failure who normally lives independently.  The patient was in his usual state of health yesterday afternoon when he apparently lost his balance and fell in his garage, landing on his right side.  He was brought to the emergency room where x-rays demonstrated a mildly displaced intertrochanteric fracture of the right hip.  The patient denies any associated injuries.  He did not strike his head or lose consciousness.  The patient also denies any lightheadedness, dizziness, chest pain, shortness of breath, or other symptoms which may have precipitated his fall.  Past Medical History:  Diagnosis Date   (HFpEF) heart failure with preserved ejection fraction (HCC)    Coronary artery disease    Diabetes mellitus without complication (HCC)    Hyperlipidemia    Hypertension    Past Surgical History:  Procedure Laterality Date   CATARACT EXTRACTION     CHOLECYSTECTOMY     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   [facility]: LIMA-LAD, SVG-OM1, and SVG-rPDA   LEFT HEART CATH AND CORS/GRAFTS ANGIOGRAPHY N/A 07/10/2021   Procedure: LEFT HEART CATH AND CORS/GRAFTS ANGIOGRAPHY;  Surgeon: Yvonne Kendall, MD;  Location: [facility] INVASIVE CV [facility];  Service: Cardiovascular;  Laterality: N/A;   Social History   Socioeconomic History   Marital status: Widowed    Spouse name: Not on file   Number of children: Not on file   Years of education: Not on file   Highest education level: Not on file  Occupational History   Not on file  Tobacco Use   Smoking status: Former   Smokeless tobacco: Never  Vaping Use    Vaping Use: Never used  Substance and Sexual Activity   Alcohol use: Never   Drug use: Never   Sexual activity: Not on file  Other Topics Concern   Not on file  Social History Narrative   Not on file   Social Determinants of Health   Financial Resource Strain: Not on file  Food Insecurity: Not on file  Transportation Needs: Not on file  Physical Activity: Not on file  Stress: Not on file  Social Connections: Not on file   Family History  Problem Relation Age of Onset   CAD Mother    Urinary tract infection Father    Hip fracture Father    Cancer Sister    Cancer Brother    Allergies  Allergen Reactions   Simvastatin    Prior to Admission medications   Medication Sig Start Date End Date Taking? Authorizing Provider  acetaminophen (TYLENOL) 325 MG tablet Take 650-1,300 mg by mouth 2 (two) times daily as needed.   Yes [provider]  amiodarone (PACERONE) 200 MG tablet Take 200 mg by mouth daily.   Yes [provider]  apixaban (ELIQUIS) 5 MG TABS tablet Take 1 tablet (5 mg total) by mouth 2 (two) times daily. 08/06/21  Yes Wieting, Richard, MD  Brinzolamide-Brimonidine 1-0.2 % SUSP Place 1 drop into the left eye 3 (three) times daily.   Yes [provider]  carboxymethylcellulose 1 % ophthalmic solution Apply 1 drop to eye at bedtime.   Yes [provider]  empagliflozin (JARDIANCE) 25 MG TABS tablet Take 12.5 mg by mouth every morning.  Yes [provider]  pantoprazole (PROTONIX) 40 MG tablet Take 40 mg by mouth daily.   Yes [provider]  Propylene Glycol 0.6 % SOLN Place 1 drop into both eyes 4 (four) times daily.   Yes [provider]  sacubitril-valsartan (ENTRESTO) 24-26 MG Take 1 tablet by mouth every 12 (twelve) hours. 09/18/21  Yes [provider]  timolol (TIMOPTIC-XR) 0.5 % ophthalmic gel-forming Place 1 drop into both eyes daily. 10/29/20  Yes [provider]  travoprost,  benzalkonium, (TRAVATAN) 0.004 % ophthalmic solution Place 1 drop into both eyes nightly.   Yes [provider]  vitamin B-12 (CYANOCOBALAMIN) 500 MCG tablet Take 1,000 mcg by mouth daily. Before breakfast   Yes [provider]  atorvastatin (LIPITOR) 80 MG tablet Take 40 mg by mouth daily. Patient not taking: Reported on 03/12/2022    [provider]  clopidogrel (PLAVIX) 75 MG tablet Take 1 tablet (75 mg total) by mouth daily with breakfast. Patient not taking: Reported on 03/12/2022 07/12/21   Nicole Kindred A, DO  diclofenac (VOLTAREN) 75 MG EC tablet Take by mouth. Patient not taking: Reported on 03/12/2022    [provider]  furosemide (LASIX) 40 MG tablet Take 1 tablet (40 mg total) by mouth daily. Patient not taking: Reported on 03/12/2022 07/12/21   Nicole Kindred A, DO  glipiZIDE (GLUCOTROL) 5 MG tablet Take by mouth. Patient not taking: Reported on 03/12/2022    [provider]  indomethacin (INDOCIN) 50 MG capsule Take by mouth. Patient not taking: Reported on 03/12/2022    [provider]  isosorbide mononitrate (IMDUR) 60 MG 24 hr tablet Take 1 tablet (60 mg total) by mouth daily. Patient not taking: Reported on 03/12/2022 08/07/21   Loletha Grayer, MD  meloxicam (MOBIC) 7.5 MG tablet Mobic 7.5 mg tablet  Take 1 tablet twice a day by oral route. Patient not taking: Reported on 03/12/2022    [provider]  metoprolol succinate (TOPROL-XL) 50 MG 24 hr tablet Take by mouth. Patient not taking: Reported on 03/12/2022 09/19/21 09/19/22  [provider]  nitroGLYCERIN (NITROSTAT) 0.4 MG SL tablet Place 1 tablet (0.4 mg total) under the tongue every 5 (five) minutes x 3 doses as needed for chest pain. 07/11/21   Ezekiel Slocumb, DO  spironolactone (ALDACTONE) 25 MG tablet Take 1 tablet by mouth daily. Patient not taking: Reported on 03/12/2022 10/18/21   [provider]  traMADol (ULTRAM) 50 MG tablet tramadol  50 mg tablet  Take 1 tablet every 6 hours by oral route. Patient not taking: Reported on 03/12/2022    [provider]  valsartan (DIOVAN) 40 MG tablet Take by mouth. Patient not taking: Reported on 03/12/2022 09/19/21 09/19/22  [provider]   ECHOCARDIOGRAM COMPLETE  Result Date: 03/12/2022    ECHOCARDIOGRAM REPORT   Patient Name:   GRACIN DAWS Date of Exam: 03/12/2022 Medical Rec #:  [id number]    Height:       72.0 in Accession #:    [id number]   Weight:       180.0 lb Date of Birth:  04/06/1941    BSA:          2.037 m Patient Age:    34 years     BP:           129/57 mmHg Patient Gender: M            HR:  47 bpm. Exam Location:  [facility] Procedure: 2D Echo, Cardiac Doppler and Color Doppler Indications:     Preoperative evaluation  History:         Patient has prior history of Echocardiogram examinations, most                  recent 07/10/2021. CHF and Cardiomyopathy, CAD and Previous                  Myocardial Infarction, Prior CABG, Arrythmias:Atrial                  Fibrillation; Risk Factors:Former Smoker, Diabetes,                  Hypertension and Dyslipidemia.  Sonographer:     Ceasar Mons Referring Phys:  [phone] CHRISTOPHER END Diagnosing Phys: Yvonne Kendall MD IMPRESSIONS  1. Left ventricular ejection fraction, by estimation, is 40 to 45%. The left ventricle has mildly decreased function. The left ventricle demonstrates regional wall motion abnormalities (see scoring diagram/findings for description). There is moderate left ventricular hypertrophy. Left ventricular diastolic parameters are consistent with Grade II diastolic dysfunction (pseudonormalization). Elevated left atrial pressure. There is severe hypokinesis of the left ventricular, basal-mid inferoseptal wall and inferior wall.  2. Right ventricular systolic function is mildly reduced. The right ventricular size is normal.  3. Left atrial size was mildly dilated.  4. Right atrial size was mildly dilated.  5.  The mitral valve is degenerative. Mild mitral valve regurgitation.  6. The aortic valve is tricuspid. There is moderate calcification of the aortic valve. There is moderate thickening of the aortic valve. Aortic valve regurgitation is mild. 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 While the mean gradient is only mildly  elevated, the abnormal valve area and dimensionless index calculations suggest at least moderate to severe low-flow:low-gradient aortic stenosis. Visual assessment of leaflet movement suggests that aortic stenosis is not critical.  7. The inferior vena cava is normal in size with <50% respiratory variability, suggesting right atrial pressure of 8 mmHg. FINDINGS  Left Ventricle: Left ventricular ejection fraction, by estimation, is 40 to 45%. The left ventricle has mildly decreased function. The left ventricle demonstrates regional wall motion abnormalities. Severe hypokinesis of the left ventricular, basal-mid inferoseptal wall and inferior wall. The left ventricular internal cavity size was normal in size. There is moderate left ventricular hypertrophy. Left ventricular diastolic parameters are consistent with Grade II diastolic dysfunction (pseudonormalization). Elevated left atrial pressure. Right Ventricle: The right ventricular size is normal. No increase in right ventricular wall thickness. Right ventricular systolic function is mildly reduced. Left Atrium: Left atrial size was mildly dilated. Right Atrium: Right atrial size was mildly dilated. Pericardium: There is no evidence of pericardial effusion. Mitral Valve: The mitral valve is degenerative in appearance. There is mild thickening of the mitral valve leaflet(s). Mild mitral annular calcification. Mild mitral valve regurgitation. Tricuspid Valve: The tricuspid valve is normal in structure. Tricuspid valve regurgitation is trivial. Aortic Valve: The aortic valve is tricuspid. There is moderate calcification of the aortic valve. There is  moderate thickening of the aortic valve. Aortic valve regurgitation is mild. Aortic regurgitation PHT measures 913 msec. Moderate to severe aortic stenosis is present. Aortic valve mean gradient measures 14.0 mmHg. Aortic valve peak gradient measures 24.2 mmHg. Aortic valve area, by VTI measures 0.59 cm. Pulmonic Valve: The pulmonic valve was grossly normal. Pulmonic valve regurgitation is trivial. No evidence of pulmonic stenosis. Aorta: The aortic root is normal in  size and structure. Venous: The inferior vena cava is normal in size with less than 50% respiratory variability, suggesting right atrial pressure of 8 mmHg. IAS/Shunts: No atrial level shunt detected by color flow Doppler.  LEFT VENTRICLE PLAX 2D LVIDd:         4.47 cm   Diastology LVIDs:         3.49 cm   LV e' medial:    4.78 cm/s LV PW:         1.40 cm   LV E/e' medial:  20.9 LV IVS:        1.20 cm   LV e' lateral:   6.83 cm/s LVOT diam:     1.90 cm   LV E/e' lateral: 14.6 LV SV:         43 LV SV Index:   21 LVOT Area:     2.84 cm  RIGHT VENTRICLE RV Basal diam:  3.21 cm RV S prime:     9.65 cm/s TAPSE (M-mode): 1.6 cm LEFT ATRIUM             Index        RIGHT ATRIUM           Index LA diam:        4.90 cm 2.41 cm/m   RA Area:     18.40 cm LA Vol (A2C):   60.5 ml 29.70 ml/m  RA Volume:   49.50 ml  24.30 ml/m LA Vol (A4C):   56.4 ml 27.68 ml/m LA Biplane Vol: 59.2 ml 29.06 ml/m  AORTIC VALVE AV Area (Vmax):    0.59 cm AV Area (Vmean):   0.60 cm AV Area (VTI):     0.59 cm AV Vmax:           246.00 cm/s AV Vmean:          174.000 cm/s AV VTI:            0.730 m AV Peak Grad:      24.2 mmHg AV Mean Grad:      14.0 mmHg LVOT Vmax:         50.80 cm/s LVOT Vmean:        37.100 cm/s LVOT VTI:          0.152 m LVOT/AV VTI ratio: 0.21 AI PHT:            913 msec  AORTA Ao Root diam: 3.40 cm MITRAL VALVE MV Area (PHT): 3.70 cm     SHUNTS MV Decel Time: 205 msec     Systemic VTI:  0.15 m MV E velocity: 100.00 cm/s  Systemic Diam: 1.90 cm MV A  velocity: 56.90 cm/s MV E/A ratio:  1.76 Cristal Deerhristopher End MD Electronically signed by Yvonne Kendallhristopher End MD Signature Date/Time: 03/12/2022/12:36:49 PM    Final    Chest Portable 1 View  Result Date: 03/12/2022 CLINICAL DATA:  Hip fracture. EXAM: PORTABLE CHEST 1 VIEW COMPARISON:  08/05/2021 FINDINGS: Post median sternotomy. Stable mild cardiomegaly. Unchanged mediastinal contours. Aortic atherosclerosis. Mild vascular congestion. Chronic interstitial coarsening. No focal airspace disease, pleural effusion or pneumothorax. No acute osseous abnormalities are seen. IMPRESSION: Stable mild cardiomegaly.  Mild vascular congestion. Electronically Signed   By: Narda RutherfordMelanie  Sanford M.D.   On: 03/12/2022 01:42   CT HEAD WO CONTRAST (5MM)  Result Date: 03/12/2022 CLINICAL DATA:  Head trauma. EXAM: CT HEAD WITHOUT CONTRAST TECHNIQUE: Contiguous axial images were obtained from the base of the skull through the vertex without intravenous contrast.  RADIATION DOSE REDUCTION: This exam was performed according to the departmental dose-optimization program which includes automated exposure control, adjustment of the mA and/or kV according to patient size and/or use of iterative reconstruction technique. COMPARISON:  Head CT dated 06/08/2006. FINDINGS: Brain: Mild age-related atrophy and chronic microvascular ischemic changes. 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 There is no acute intracranial hemorrhage. No mass effect or midline shift. No extra-axial fluid collection. Vascular: No hyperdense vessel or unexpected calcification. Skull: Normal. Negative for fracture or focal lesion. Sinuses/Orbits: No acute finding. Other: None IMPRESSION: 1. No acute intracranial pathology. 2. Mild age-related atrophy and chronic microvascular ischemic changes. Electronically Signed   By: Anner Crete M.D.   On: 03/12/2022 01:25   DG Hip Unilat With Pelvis 2-3 Views Right  Result Date: 03/11/2022 CLINICAL DATA:  [place], right  hip injury EXAM: DG HIP (WITH OR WITHOUT PELVIS) 2-3V RIGHT COMPARISON:  None Available. FINDINGS: Frontal view of the pelvis as well as frontal and frogleg lateral views of the right hip are obtained. There is a minimally displaced intertrochanteric right hip fracture with anatomic alignment. Symmetrical bilateral hip osteoarthritis. No subluxation or dislocation. Diffuse atherosclerosis. IMPRESSION: 1. Minimally displaced intertrochanteric right hip fracture, with anatomic alignment. Electronically Signed   By: Randa Ngo M.D.   On: 03/11/2022 22:20    Positive ROS: All other systems have been reviewed and were otherwise negative with the exception of those mentioned in the HPI and as above.  Physical Exam: General:  Alert, no acute distress Psychiatric:  Patient is competent for consent with normal mood and affect   Cardiovascular:  No pedal edema Respiratory:  No wheezing, non-labored breathing GI:  Abdomen is soft and non-tender Skin:  No lesions in the area of chief complaint Neurologic:  Sensation intact distally Lymphatic:  No axillary or cervical lymphadenopathy  Orthopedic Exam:  Orthopedic examination is limited to the right hip and lower extremity.  The right lower extremity is slightly shortened and externally rotated as compared to the left.  Skin inspection around the right hip is unremarkable.  No swelling, erythema, ecchymosis, abrasions, or other skin abnormalities are identified.  He has mild tenderness to palpation over the lateral aspect of the right hip.  He has more severe pain with any attempted active or passive motion of the hip.  He is neurovascularly intact to the right lower extremity and foot.  X-rays:  Recent x-rays of the pelvis and right hip are available for review and have been reviewed by myself.  The findings are as described above.  There is mild degenerative changes of the hip joint as well, as manifest by mild superior joint space narrowing and early  bridging osteophytes around the femoral neck.  No lytic lesions or other acute bony abnormalities are identified.  Assessment: Mildly displaced 2 part intertrochanteric fracture, right hip.  Plan: The treatment options, including both surgical and nonsurgical choices, have been discussed in detail with the patient and his niece.  They would like to proceed with surgical intervention to include an intramedullary nailing of the minimally displaced intertrochanteric fracture of the right hip.  The risks (including bleeding, infection, nerve and/or blood vessel injury, persistent or recurrent pain, loosening or failure of the components, malunion and/or nonunion, progression of hip arthritis, need for further surgery, blood clots, strokes, heart attacks or arrhythmias, pneumonia, etc.) and benefits of the surgical procedure were discussed. The patient states his understanding and agrees to proceed.  A formal written consent will be  obtained by the nursing staff.  Thank you for asking me to participate in the care of this most delightful gentleman.  I will be happy to follow him with you.   Maryagnes Amos, MD  Beeper #:  [phone]  03/12/2022 1:35 PM

## 2022-03-13 ENCOUNTER — Encounter: Payer: Self-pay | Admitting: Surgery

## 2022-03-13 DIAGNOSIS — I4891 Unspecified atrial fibrillation: Secondary | ICD-10-CM

## 2022-03-13 DIAGNOSIS — I429 Cardiomyopathy, unspecified: Secondary | ICD-10-CM

## 2022-03-13 DIAGNOSIS — I350 Nonrheumatic aortic (valve) stenosis: Secondary | ICD-10-CM

## 2022-03-13 DIAGNOSIS — S72141A Displaced intertrochanteric fracture of right femur, initial encounter for closed fracture: Secondary | ICD-10-CM

## 2022-03-13 LAB — CBC
HCT: 37.2 % — ABNORMAL LOW (ref 39.0–52.0)
Hemoglobin: 11.8 g/dL — ABNORMAL LOW (ref 13.0–17.0)
MCH: 32.1 pg (ref 26.0–34.0)
MCHC: 31.7 g/dL (ref 30.0–36.0)
MCV: 101.1 fL — ABNORMAL HIGH (ref 80.0–100.0)
Platelets: 148 10*3/uL — ABNORMAL LOW (ref 150–400)
RBC: 3.68 MIL/uL — ABNORMAL LOW (ref 4.22–5.81)
RDW: 14.3 % (ref 11.5–15.5)
WBC: 9.8 10*3/uL (ref 4.0–10.5)
nRBC: 0 % (ref 0.0–0.2)

## 2022-03-13 LAB — BASIC METABOLIC PANEL
Anion gap: 3 — ABNORMAL LOW (ref 5–15)
BUN: 33 mg/dL — ABNORMAL HIGH (ref 8–23)
CO2: 25 mmol/L (ref 22–32)
Calcium: 8.4 mg/dL — ABNORMAL LOW (ref 8.9–10.3)
Chloride: 109 mmol/L (ref 98–111)
Creatinine, Ser: 1.54 mg/dL — ABNORMAL HIGH (ref 0.61–1.24)
GFR, Estimated: 45 mL/min — ABNORMAL LOW (ref >60)
Glucose, Bld: 149 mg/dL — ABNORMAL HIGH (ref 70–99)
Potassium: 4.6 mmol/L (ref 3.5–5.1)
Sodium: 137 mmol/L (ref 135–145)

## 2022-03-13 LAB — GLUCOSE, CAPILLARY
Glucose-Capillary: 157 mg/dL — ABNORMAL HIGH (ref 70–99)
Glucose-Capillary: 224 mg/dL — ABNORMAL HIGH (ref 70–99)
Glucose-Capillary: 203 mg/dL — ABNORMAL HIGH (ref 70–99)
Glucose-Capillary: 179 mg/dL — ABNORMAL HIGH (ref 70–99)

## 2022-03-13 LAB — MAGNESIUM: Magnesium: 2.3 mg/dL (ref 1.7–2.4)

## 2022-03-13 MED ORDER — POLYETHYLENE GLYCOL 3350 17 G PO PACK
17.0000 g | PACK | Freq: Every day | ORAL | Status: DC
  Administered 2022-03-13 – 2022-03-14 (×2): 17 g via ORAL
  Filled 2022-03-13 (×2): qty 1

## 2022-03-13 MED ORDER — FUROSEMIDE 40 MG PO TABS
40.0000 mg | ORAL_TABLET | Freq: Every day | ORAL | Status: DC
  Administered 2022-03-14: 40 mg via ORAL
  Filled 2022-03-13: qty 1

## 2022-03-13 NOTE — Discharge Instructions (Signed)

## 2022-03-13 NOTE — Plan of Care (Signed)

## 2022-03-13 NOTE — Evaluation (Signed)
Physical Therapy Evaluation Patient Details Name: Bradley [name] MRN: [id number] DOB: 1941-08-11 Today's Date: 03/13/2022  History of Present Illness  Bradley [name] is a 81 y.o. 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 Per patient he was out tending to his plants when he tripped on wet uneven ground and fell landing on his right hip. Patient states prior to the fall he was in his normal state of health. He denies any sob/chest/pain /n/v/dysuria or diarrhea / fever or chills. S/P R femur ORIF.   Clinical Impression  Patient received in recliner, he is pleasant, agrees to PT assessment. Patient has mild pain in R LE. He is able to perform sit to stand with supervision. Ambulated 150 feet with RW and min guard. Cues for safety, sequencing, use of AD. Patient will continue to benefit from skilled PT to improve functional independence and strength.          Recommendations for follow up therapy are one component of a multi-disciplinary discharge planning process, led by the attending physician.  Recommendations may be updated based on patient status, additional functional criteria and insurance authorization.  Follow Up Recommendations Home health PT      Assistance Recommended at Discharge Intermittent Supervision/Assistance  Patient can return home with the following  A little help with walking and/or transfers;A little help with bathing/dressing/bathroom;Assist for transportation;Help with stairs or ramp for entrance;Assistance with cooking/housework    Equipment Recommendations Rolling walker (2 wheels)  Recommendations for Other Services       Functional Status Assessment Patient has had a recent decline in their functional status and demonstrates the ability to make significant improvements in function in a reasonable and predictable  amount of time.     Precautions / Restrictions Precautions Precautions: Fall Restrictions Weight Bearing Restrictions: Yes RLE Weight Bearing: Weight bearing as tolerated      Mobility  Bed Mobility Overal bed mobility: Modified Independent             General bed mobility comments: increased time, effort    Transfers Overall transfer level: Modified independent Equipment used: Rolling walker (2 wheels)                    Ambulation/Gait Ambulation/Gait assistance: Min guard Gait Distance (Feet): 150 Feet Assistive device: Rolling walker (2 wheels) Gait Pattern/deviations: Step-through pattern, Decreased step length - right, Decreased step length - left, Decreased weight shift to right Gait velocity: slightly slowed     General Gait Details: cues for proximity to [employer] Mobility    Modified Rankin (Stroke Patients Only)       Balance Overall balance assessment: Needs assistance Sitting-balance support: Feet supported Sitting balance-Leahy Scale: Good     Standing balance support: Bilateral upper extremity supported, During functional activity, Reliant on assistive device for balance Standing balance-Leahy Scale: Good                               Pertinent Vitals/Pain Pain Assessment Pain Assessment: Faces Faces Pain Scale: Hurts little more Pain Location: R hip Pain Descriptors / Indicators: Grimacing, Discomfort, Guarding, Sore Pain Intervention(s): Monitored during session, Repositioned    Home Living Family/patient expects to be discharged to:: Private residence Living  Arrangements: Other relatives Available Help at Discharge: Family;Available PRN/intermittently Type of Home: House           Home Equipment: None      Prior Function Prior Level of Function : Independent/Modified Independent;Driving             Mobility Comments: fully independent prior ADLs Comments: fully  independent prior     Hand Dominance        Extremity/Trunk Assessment   Upper Extremity Assessment Upper Extremity Assessment: Defer to OT evaluation    Lower Extremity Assessment Lower Extremity Assessment: RLE deficits/detail RLE Coordination: decreased gross motor    Cervical / Trunk Assessment Cervical / Trunk Assessment: Normal  Communication   Communication: No difficulties  Cognition Arousal/Alertness: Awake/alert Behavior During Therapy: WFL for tasks assessed/performed Overall Cognitive Status: Within Functional Limits for tasks assessed                                          General Comments      Exercises     Assessment/Plan    PT Assessment Patient needs continued PT services  PT Problem List Decreased range of motion;Decreased strength;Decreased mobility;Decreased activity tolerance;Decreased balance;Decreased knowledge of use of DME;Pain       PT Treatment Interventions DME instruction;Therapeutic exercise;Gait training;Balance training;Functional mobility training;Stair training;Therapeutic activities;Patient/family education;Neuromuscular re-education    PT Goals (Current goals can be found in the Care Plan section)  Acute Rehab PT Goals Patient Stated Goal: to get back to full independence PT Goal Formulation: With patient Time For Goal Achievement: 03/20/22 Potential to Achieve Goals: Good    Frequency BID     Co-evaluation               AM-PAC PT "6 Clicks" Mobility  Outcome Measure Help needed turning from your back to your side while in a flat bed without using bedrails?: A Little Help needed moving from lying on your back to sitting on the side of a flat bed without using bedrails?: A Little Help needed moving to and from a bed to a chair (including a wheelchair)?: A Little Help needed standing up from a chair using your arms (e.g., wheelchair or bedside chair)?: A Little Help needed to walk in hospital  room?: A Little Help needed climbing 3-5 steps with a railing? : A Little 6 Click Score: 18    End of Session Equipment Utilized During Treatment: Gait belt Activity Tolerance: Patient tolerated treatment well Patient left: in bed;with call bell/phone within reach;with bed alarm set Nurse Communication: Mobility status PT Visit Diagnosis: Other abnormalities of gait and mobility (R26.89);Muscle weakness (generalized) (M62.81);Difficulty in walking, not elsewhere classified (R26.2);Pain;History of falling (Z91.81) Pain - Right/Left: Right Pain - part of body: Leg    Time: 8299-3716 PT Time Calculation (min) (ACUTE ONLY): 14 min   Charges:   PT Evaluation $PT Eval Moderate Complexity: 1 Mod          Deryn Massengale, PT, GCS 03/13/22,11:36 AM

## 2022-03-13 NOTE — Evaluation (Addendum)
Occupational Therapy Evaluation Patient Details Name: Bradley [name] MRN: [id number] DOB: 1941-03-08 Today's Date: 03/13/2022   History of Present Illness Johnaton Sonneborn is a 81 y.o. 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 Per patient he was out tending to his plants when he tripped on wet uneven ground and fell landing on his right hip. Patient states prior to the fall he was in his normal state of health. He denies any sob/chest/pain /n/v/dysuria or diarrhea / fever or chills. S/P R femur ORIF.   Clinical Impression   Pt was seen for OT evaluation this date. Prior to hospital admission, pt was working, driving, independent with mobility and ADLs. Pt lives alone in a one level house with 3 steps to enter. Pt reports family and friends can provide assistance PRN if needed. Pt presents to acute OT demonstrating impaired ADL performance and functional mobility 2/2 decreased activity tolerance and functional strength/ROM/balance deficits. Pt currently requires MOD I for supine>sit and SUPERVISION with min vcs for safe hand placement for STS.   MIN A for LBD seated at EOB - needs assistance with threading R LE and no observed LOB to pull underpants into place in standing. CLOSE SUPERVISION for grooming tasks at the sink with no UE support for balance ~5-6 minutes. CGA + RW for simulated toilet t/f ~23f. Pt left in chair with all needs met. Pt would benefit from skilled OT to address noted impairments and functional limitations (see below for any additional details). Upon hospital discharge, recommend HHOT to maximize pt safety and return to PLOF.      Recommendations for follow up therapy are one component of a multi-disciplinary discharge planning process, led by the attending physician.  Recommendations may be updated based on patient status, additional  functional criteria and insurance authorization.   Follow Up Recommendations  Home health OT    Assistance Recommended at Discharge Intermittent Supervision/Assistance  Patient can return home with the following A little help with walking and/or transfers;A little help with bathing/dressing/bathroom;Assistance with cooking/housework;Assist for transportation;Help with stairs or ramp for entrance    Functional Status Assessment  Patient has had a recent decline in their functional status and demonstrates the ability to make significant improvements in function in a reasonable and predictable amount of time.  Equipment Recommendations  None recommended by OT    Recommendations for Other Services       Precautions / Restrictions Precautions Precautions: Fall Restrictions Weight Bearing Restrictions: Yes RLE Weight Bearing: Weight bearing as tolerated      Mobility Bed Mobility Overal bed mobility: Modified Independent             General bed mobility comments: increased time and use of bed railings    Transfers Overall transfer level: Needs assistance Equipment used: Rolling walker (2 wheels) Transfers: Sit to/from Stand Sit to Stand: Supervision                  Balance Overall balance assessment: Needs assistance Sitting-balance support: Feet supported, No upper extremity supported Sitting balance-Leahy Scale: Good     Standing balance support: No upper extremity supported, During functional activity Standing balance-Leahy Scale: Good Standing balance comment: no UE support to perform grooming tasks at sink  ADL either performed or assessed with clinical judgement   ADL Overall ADL's : Needs assistance/impaired                                       General ADL Comments: MIN A for LBD seated at EOB - needs assistance with threading R LE. Assistance progressing to [place] for pulling underpants into place  in standing. CLOSE SUPERVISION for grooming tasks at the sink with no UE support for balance ~5-6 minutes. CGA + RW for simulated toilet t/f.      Pertinent Vitals/Pain Pain Assessment Pain Assessment: 0-10 Pain Score: 6  Pain Location: R hip Pain Descriptors / Indicators: Discomfort, Grimacing, Sore Pain Intervention(s): Monitored during session, Limited activity within patient's tolerance, Repositioned     Hand Dominance     Extremity/Trunk Assessment Upper Extremity Assessment Upper Extremity Assessment: Overall WFL for tasks assessed   Lower Extremity Assessment Lower Extremity Assessment: Generalized weakness RLE Coordination: decreased gross motor   Cervical / Trunk Assessment Cervical / Trunk Assessment: Normal   Communication Communication Communication: No difficulties   Cognition Arousal/Alertness: Awake/alert Behavior During Therapy: WFL for tasks assessed/performed Overall Cognitive Status: Within Functional Limits for tasks assessed                                                  Home Living Family/patient expects to be discharged to:: Private residence Living Arrangements: Alone Available Help at Discharge: Family;Friend(s);Available PRN/intermittently Type of Home: House Home Access: Stairs to enter [employer] of Steps: 3   Home Layout: One level     Bathroom Shower/Tub: Occupational psychologist: Standard     Home Equipment: None          Prior Functioning/Environment Prior Level of Function : Independent/Modified Independent;Driving;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)             Mobility Comments: no AD for mobility ADLs Comments: independent with ADLs        OT Problem List: Decreased strength;Decreased range of motion;Decreased activity tolerance;Impaired balance (sitting and/or standing)      OT Treatment/Interventions: Self-care/ADL training;Therapeutic exercise;Energy  conservation;DME and/or AE instruction;Therapeutic activities;Patient/family education;Balance training    OT Goals(Current goals can be found in the care plan section) Acute Rehab OT Goals Patient Stated Goal: to return to PLOF OT Goal Formulation: With patient Time For Goal Achievement: 03/27/22 Potential to Achieve Goals: Good ADL Goals Pt Will Perform Grooming: with modified independence;standing Pt Will Perform Lower Body Dressing: with modified independence;with adaptive equipment;sit to/from stand (with LRAD) Pt Will Transfer to Toilet: with modified independence;ambulating;regular height toilet  OT Frequency: Min 2X/week       AM-PAC OT "6 Clicks" Daily Activity     Outcome Measure Help from another person eating meals?: None Help from another person taking care of personal grooming?: A Little Help from another person toileting, which includes using toliet, bedpan, or urinal?: A Little Help from another person bathing (including washing, rinsing, drying)?: A Lot Help from another person to put on and taking off regular upper body clothing?: None Help from another person to put on and taking off regular lower body clothing?: A Little 6 Click Score: 19   End of Session Equipment Utilized During Treatment: Gait belt;Rolling walker (  2 wheels)  Activity Tolerance: Patient tolerated treatment well Patient left: in chair;with call bell/phone within reach;with chair alarm set  OT Visit Diagnosis: Muscle weakness (generalized) (M62.81)                Time: 1638-4536 OT Time Calculation (min): 24 min Charges:  OT General Charges $OT Visit: 1 Visit OT Evaluation $OT Eval Moderate Complexity: 1 Mod OT Treatments $Self Care/Home Management : 8-22 mins  [employer], OTDS  [employer] 03/13/2022, 1:49 PM

## 2022-03-13 NOTE — Progress Notes (Signed)
Nutrition Follow-up  DOCUMENTATION CODES:   Not applicable  INTERVENTION:   -Continue MVI with minerals daily -D/c Ensure Enlive -Liberalize diet to  carb modified fot wider variety of meal selections -Magic cup BID with meals, each supplement provides 290 kcal and 9 grams of protein   NUTRITION DIAGNOSIS:   Increased nutrient needs related to post-op healing as evidenced by estimated needs.  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 Pt reports feeling good and eager to walk today. Per pt, he alaways has a good appetite and consumed 100% of his breakfast. PTA, he consumes 3 meals per day (Breakfast: oatmeal, eggs, bacon, and cofee; Lunch and Dinner: meat, starch, and vegetable provided by family members). Pt also enjoys eating at the [employer] near his home.   Pt denies any weight loss. He reports his UBW is around 211#.   Discussed importance of good meal intake to promote healing.   Medications reviewed and include colace and lasix.  Labs reviewed: CBGS: 131-157 (inpatient orders for glycemic control are 25 mg empagliflozin daily and 0-6 units insulin aspart TID with meals).    Diet Order:   Diet Order             Diet heart healthy/carb modified Room service appropriate? Yes; Fluid consistency: Thin  Diet effective now                    EDUCATION NEEDS:   No education needs have been identified at this time  Skin:  Skin Assessment: Skin Integrity Issues: Skin Integrity Issues:: Incisions Incisions: closed rt hip  Last BM:  03/11/22  Height:   Ht Readings from Last 1 Encounters:  03/11/22 6' (1.829 m)    Weight:   Wt Readings from Last 1 Encounters:  03/11/22 81.6 kg    Ideal Body Weight:  80.9 kg  BMI:  Body mass index is 24.41 kg/m.  Estimated Nutritional Needs:   Kcal:  2000-2200  Protein:  105-120 grams  Fluid:  > 2 L    Levada Schilling, RD, LDN, CDCES Registered Dietitian II Certified Diabetes Care and Education Specialist Please refer to [facility] for RD and/or RD on-call/weekend/after hours pager

## 2022-03-13 NOTE — Progress Notes (Signed)
  Subjective: 1 Day Post-Op Procedure(s) (LRB): INTRAMEDULLARY (IM) NAIL INTERTROCHANTRIC (Right) Patient reports pain as mild.  Does state he had more pain when getting up with therapy. Patient is well, and has had no acute complaints or problems PT and care management to assist with discharge planning, patient typically lives at home independently. Negative for chest pain and shortness of breath Fever: no Gastrointestinal:Negative for nausea and vomiting States that he is passing gas.  Objective: Vital signs in last 24 hours: Temp:  [97 F (36.1 C)-98.6 F (37 C)] 98.6 F (37 C) (07/19 0736) Pulse Rate:  [33-58] 53 (07/19 0736) Resp:  [13-20] 18 (07/19 0736) BP: (124-166)/(42-57) 127/42 (07/19 0736) SpO2:  [94 %-97 %] 96 % (07/19 0736)  Intake/Output from previous day:  Intake/Output Summary (Last 24 hours) at 03/13/2022 1009 Last data filed at 03/13/2022 0524 Gross per 24 hour  Intake 1367 ml  Output 1600 ml  Net -233 ml    Intake/Output this shift: No intake/output data recorded.  Labs: Recent Labs    03/11/22 2322 03/12/22 0418 03/13/22 0357  HGB 12.7* 12.4* 11.8*   Recent Labs    03/12/22 0418 03/13/22 0357  WBC 11.2* 9.8  RBC 3.88* 3.68*  HCT 40.0 37.2*  PLT 173 148*   Recent Labs    03/12/22 0418 03/13/22 0357  NA 139 137  K 4.6 4.6  CL 111 109  CO2 23 25  BUN 34* 33*  CREATININE 1.46* 1.54*  GLUCOSE 143* 149*  CALCIUM 8.3* 8.4*   Recent Labs    03/11/22 2322  INR 1.2     EXAM General - Patient is Alert, Appropriate, and Oriented Extremity - ABD soft Neurovascular intact Dorsiflexion/Plantar flexion intact Incision: dressing C/D/I No cellulitis present Dressing/Incision - clean, dry, no drainage noted to the right leg Motor Function - intact, moving foot and toes well on exam.  Abdomen soft with intact bowel sounds. Thigh soft to palpation without significant swelling.  Past Medical History:  Diagnosis Date   (HFpEF) heart  failure with preserved ejection fraction (HCC)    Coronary artery disease    Diabetes mellitus without complication (HCC)    Hyperlipidemia    Hypertension     Assessment/Plan: 1 Day Post-Op Procedure(s) (LRB): INTRAMEDULLARY (IM) NAIL INTERTROCHANTRIC (Right) Principal Problem:   Hip fx (HCC) Active Problems:   Chronic HFrEF (heart failure with reduced ejection fraction) (HCC)  Estimated body mass index is 24.41 kg/m as calculated from the following:   Height as of this encounter: 6' (1.829 m).   Weight as of this encounter: 81.6 kg. Advance diet Up with therapy D/C IV fluids when tolerating po intake.  Labs reviewed this AM, Hg 11.8, HCT 37 Continue with therapy today, patient may need SNF upon discharge. Continue to work on BM.  Follow-up with [facility] orthopaedics in 14 days after discharge for x-rays and staple removal. Continue with chronic Eliquis and Plavix for DVT prophylaxis.  DVT Prophylaxis - Foot Pumps and Eliquis Weight-Bearing as tolerated to right leg  J. Horris Latino, PA-C [facility] 03/13/2022, 10:09 AM

## 2022-03-13 NOTE — Progress Notes (Signed)
Progress Note  Patient Name: Bradley [name] Date of Encounter: 03/13/2022  [facility] Cardiologist: Julien Nordmann, MD   Subjective   Patient seen on AM rounds. Denies any chest pain or shortness of breath. Has already walked with walker and is sitting in the recliner.   Inpatient Medications    Scheduled Meds:  acetaminophen  500 mg Oral Q6H   amiodarone  200 mg Oral Daily   apixaban  5 mg Oral BID   docusate sodium  100 mg Oral BID   empagliflozin  25 mg Oral q morning   feeding supplement  237 mL Oral BID BM   furosemide  40 mg Intravenous q morning   insulin aspart  0-6 Units Subcutaneous TID WC   latanoprost  1 drop Both Eyes QHS   multivitamin with minerals  1 tablet Oral Daily   pantoprazole  40 mg Oral Daily   sacubitril-valsartan  1 tablet Oral Q12H   timolol  1 drop Both Eyes Daily   Continuous Infusions:  sodium chloride Stopped (03/13/22 0730)    ceFAZolin (ANCEF) IV     PRN Meds: acetaminophen, bisacodyl, diphenhydrAMINE, HYDROcodone-acetaminophen, HYDROmorphone (DILAUDID) injection, magnesium hydroxide, metoCLOPramide **OR** metoCLOPramide (REGLAN) injection, nitroGLYCERIN, ondansetron **OR** ondansetron (ZOFRAN) IV, senna-docusate, sodium phosphate   Vital Signs    Vitals:   03/12/22 1737 03/12/22 2028 03/13/22 0522 03/13/22 0736  BP:  (!) 124/48 (!) 124/51 (!) 127/42  Pulse:  (!) 58 (!) 56 (!) 53  Resp: 16 16 17 18   Temp:  97.8 F (36.6 C) 98 F (36.7 C) 98.6 F (37 C)  TempSrc:    Oral  SpO2:  97% 96% 96%  Weight:      Height:        Intake/Output Summary (Last 24 hours) at 03/13/2022 1046 Last data filed at 03/13/2022 1027 Gross per 24 hour  Intake 1847 ml  Output 1600 ml  Net 247 ml      03/11/2022    9:30 PM 03/11/2022    9:02 PM 12/10/2021   10:44 AM  Last 3 Weights  Weight (lbs) 180 lb 180 lb 184 lb 6 oz  Weight (kg) 81.647 kg 81.647 kg 83.632 kg      Telemetry    Sinus brady rate 40-50's - Personally Reviewed  ECG    No  new tracings- Personally Reviewed  Physical Exam   GEN: No acute distress.   Neck: No JVD Cardiac: RRR, brady I/VI systolic murmur, without rubs, or gallops.  Respiratory: Clear to auscultation bilaterally. Respirations are unlabored at rest on room air GI: Soft, nontender, non-distended  MS: No edema; No deformity. Neuro:  Nonfocal  Psych: Normal affect   Labs    High Sensitivity Troponin:   Recent Labs  Lab 03/12/22 0144 03/12/22 0418  TROPONINIHS 10 10     Chemistry Recent Labs  Lab 03/11/22 2322 03/12/22 0418 03/13/22 0357  NA 140 139 137  K 5.1 4.6 4.6  CL 108 111 109  CO2 25 23 25   GLUCOSE 201* 143* 149*  BUN 40* 34* 33*  CREATININE 1.79* 1.46* 1.54*  CALCIUM 8.8* 8.3* 8.4*  MG  --   --  2.3  GFRNONAA 38* 48* 45*  ANIONGAP 7 5 3*    Lipids No results for input(s): "CHOL", "TRIG", "HDL", "LABVLDL", "LDLCALC", "CHOLHDL" in the last 168 hours.  Hematology Recent Labs  Lab 03/11/22 2322 03/12/22 0418 03/13/22 0357  WBC 12.3* 11.2* 9.8  RBC 3.98* 3.88* 3.68*  HGB 12.7* 12.4*  11.8*  HCT 41.2 40.0 37.2*  MCV 103.5* 103.1* 101.1*  MCH 31.9 32.0 32.1  MCHC 30.8 31.0 31.7  RDW 14.5 14.6 14.3  PLT 189 173 148*   Thyroid No results for input(s): "TSH", "FREET4" in the last 168 hours.  BNP Recent Labs  Lab 03/12/22 0144  BNP 739.8*    DDimer No results for input(s): "DDIMER" in the last 168 hours.   Radiology    DG FEMUR, MIN 2 VIEWS RIGHT  Result Date: 03/12/2022 CLINICAL DATA:  Nail fixation of right femur EXAM: RIGHT FEMUR 2 VIEWS COMPARISON:  03/11/2022 FINDINGS: Four fluoroscopic images are obtained during the performance of the procedure and are provided for interpretation only. Images demonstrate intramedullary nail fixation of a right intertrochanteric femoral fracture. Fracture components are in near anatomic alignment. Fluoroscopy time: 1 minute 10 seconds mGy IMPRESSION: IM nail fixation of the right intertrochanteric femur fracture.  Electronically Signed   By: Merilyn Baba M.D.   On: 03/12/2022 16:13   DG C-Arm 1-60 Min-No Report  Result Date: 03/12/2022 Fluoroscopy was utilized by the requesting physician.  No radiographic interpretation.   DG C-Arm 1-60 Min-No Report  Result Date: 03/12/2022 Fluoroscopy was utilized by the requesting physician.  No radiographic interpretation.   ECHOCARDIOGRAM COMPLETE  Result Date: 03/12/2022    ECHOCARDIOGRAM REPORT   Patient Name:   Bradley [name] Date of Exam: 03/12/2022 Medical Rec #:  [id number]    Height:       72.0 in Accession #:    [id number]   Weight:       180.0 lb Date of Birth:  12/26/1940    BSA:          2.037 m Patient Age:    81 years     BP:           129/57 mmHg Patient Gender: M            HR:           47 bpm. Exam Location:  [facility] Procedure: 2D Echo, Cardiac Doppler and Color Doppler Indications:     Preoperative evaluation  History:         Patient has prior history of Echocardiogram examinations, most                  recent 07/10/2021. CHF and Cardiomyopathy, CAD and Previous                  Myocardial Infarction, Prior CABG, Arrythmias:Atrial                  Fibrillation; Risk Factors:Former Smoker, Diabetes,                  Hypertension and Dyslipidemia.  Sonographer:     Rosalia Hammers Referring Phys:  [id number] CHRISTOPHER END Diagnosing Phys: Nelva Bush MD IMPRESSIONS  1. Left ventricular ejection fraction, by estimation, is 40 to 45%. The left ventricle has mildly decreased function. The left ventricle demonstrates regional wall motion abnormalities (see scoring diagram/findings for description). There is moderate left ventricular hypertrophy. Left ventricular diastolic parameters are consistent with Grade II diastolic dysfunction (pseudonormalization). Elevated left atrial pressure. There is severe hypokinesis of the left ventricular, basal-mid inferoseptal wall and inferior wall.  2. Right ventricular systolic function is mildly reduced. The right ventricular size  is normal.  3. Left atrial size was mildly dilated.  4. Right atrial size was mildly dilated.  5. The mitral valve is degenerative. Mild mitral valve  regurgitation.  6. The aortic valve is tricuspid. There is moderate calcification of the aortic valve. There is moderate thickening of the aortic valve. Aortic valve regurgitation is mild. 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 While the mean gradient is only mildly  elevated, the abnormal valve area and dimensionless index calculations suggest at least moderate to severe low-flow:low-gradient aortic stenosis. Visual assessment of leaflet movement suggests that aortic stenosis is not critical.  7. The inferior vena cava is normal in size with <50% respiratory variability, suggesting right atrial pressure of 8 mmHg. FINDINGS  Left Ventricle: Left ventricular ejection fraction, by estimation, is 40 to 45%. The left ventricle has mildly decreased function. The left ventricle demonstrates regional wall motion abnormalities. Severe hypokinesis of the left ventricular, basal-mid inferoseptal wall and inferior wall. The left ventricular internal cavity size was normal in size. There is moderate left ventricular hypertrophy. Left ventricular diastolic parameters are consistent with Grade II diastolic dysfunction (pseudonormalization). Elevated left atrial pressure. Right Ventricle: The right ventricular size is normal. No increase in right ventricular wall thickness. Right ventricular systolic function is mildly reduced. Left Atrium: Left atrial size was mildly dilated. Right Atrium: Right atrial size was mildly dilated. Pericardium: There is no evidence of pericardial effusion. Mitral Valve: The mitral valve is degenerative in appearance. There is mild thickening of the mitral valve leaflet(s). Mild mitral annular calcification. Mild mitral valve regurgitation. Tricuspid Valve: The tricuspid valve is normal in structure. Tricuspid valve regurgitation is trivial. Aortic  Valve: The aortic valve is tricuspid. There is moderate calcification of the aortic valve. There is moderate thickening of the aortic valve. Aortic valve regurgitation is mild. Aortic regurgitation PHT measures 913 msec. Moderate to severe aortic stenosis is present. Aortic valve mean gradient measures 14.0 mmHg. Aortic valve peak gradient measures 24.2 mmHg. Aortic valve area, by VTI measures 0.59 cm. Pulmonic Valve: The pulmonic valve was grossly normal. Pulmonic valve regurgitation is trivial. No evidence of pulmonic stenosis. Aorta: The aortic root is normal in size and structure. Venous: The inferior vena cava is normal in size with less than 50% respiratory variability, suggesting right atrial pressure of 8 mmHg. IAS/Shunts: No atrial level shunt detected by color flow Doppler.  LEFT VENTRICLE PLAX 2D LVIDd:         4.47 cm   Diastology LVIDs:         3.49 cm   LV e' medial:    4.78 cm/s LV PW:         1.40 cm   LV E/e' medial:  20.9 LV IVS:        1.20 cm   LV e' lateral:   6.83 cm/s LVOT diam:     1.90 cm   LV E/e' lateral: 14.6 LV SV:         43 LV SV Index:   21 LVOT Area:     2.84 cm  RIGHT VENTRICLE RV Basal diam:  3.21 cm RV S prime:     9.65 cm/s TAPSE (M-mode): 1.6 cm LEFT ATRIUM             Index        RIGHT ATRIUM           Index LA diam:        4.90 cm 2.41 cm/m   RA Area:     18.40 cm LA Vol (A2C):   60.5 ml 29.70 ml/m  RA Volume:   49.50 ml  24.30 ml/m LA Vol (A4C):   56.4  ml 27.68 ml/m LA Biplane Vol: 59.2 ml 29.06 ml/m  AORTIC VALVE AV Area (Vmax):    0.59 cm AV Area (Vmean):   0.60 cm AV Area (VTI):     0.59 cm AV Vmax:           246.00 cm/s AV Vmean:          174.000 cm/s AV VTI:            0.730 m AV Peak Grad:      24.2 mmHg AV Mean Grad:      14.0 mmHg LVOT Vmax:         50.80 cm/s LVOT Vmean:        37.100 cm/s LVOT VTI:          0.152 m LVOT/AV VTI ratio: 0.21 AI PHT:            913 msec  AORTA Ao Root diam: 3.40 cm MITRAL VALVE MV Area (PHT): 3.70 cm     SHUNTS MV Decel  Time: 205 msec     Systemic VTI:  0.15 m MV E velocity: 100.00 cm/s  Systemic Diam: 1.90 cm MV A velocity: 56.90 cm/s MV E/A ratio:  1.76 Harrell Gave End MD Electronically signed by Nelva Bush MD Signature Date/Time: 03/12/2022/12:36:49 PM    Final    Chest Portable 1 View  Result Date: 03/12/2022 CLINICAL DATA:  Hip fracture. EXAM: PORTABLE CHEST 1 VIEW COMPARISON:  08/05/2021 FINDINGS: Post median sternotomy. Stable mild cardiomegaly. Unchanged mediastinal contours. Aortic atherosclerosis. Mild vascular congestion. Chronic interstitial coarsening. No focal airspace disease, pleural effusion or pneumothorax. No acute osseous abnormalities are seen. IMPRESSION: Stable mild cardiomegaly.  Mild vascular congestion. Electronically Signed   By: Keith Rake M.D.   On: 03/12/2022 01:42   CT HEAD WO CONTRAST (5MM)  Result Date: 03/12/2022 CLINICAL DATA:  Head trauma. EXAM: CT HEAD WITHOUT CONTRAST TECHNIQUE: Contiguous axial images were obtained from the base of the skull through the vertex without intravenous contrast. RADIATION DOSE REDUCTION: This exam was performed according to the departmental dose-optimization program which includes automated exposure control, adjustment of the mA and/or kV according to patient size and/or use of iterative reconstruction technique. COMPARISON:  Head CT dated 06/08/2006. FINDINGS: Brain: Mild age-related atrophy and chronic microvascular ischemic changes. 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 There is no acute intracranial hemorrhage. No mass effect or midline shift. No extra-axial fluid collection. Vascular: No hyperdense vessel or unexpected calcification. Skull: Normal. Negative for fracture or focal lesion. Sinuses/Orbits: No acute finding. Other: None IMPRESSION: 1. No acute intracranial pathology. 2. Mild age-related atrophy and chronic microvascular ischemic changes. Electronically Signed   By: Anner Crete M.D.   On: 03/12/2022 01:25    DG Hip Unilat With Pelvis 2-3 Views Right  Result Date: 03/11/2022 CLINICAL DATA:  [place], right hip injury EXAM: DG HIP (WITH OR WITHOUT PELVIS) 2-3V RIGHT COMPARISON:  None Available. FINDINGS: Frontal view of the pelvis as well as frontal and frogleg lateral views of the right hip are obtained. There is a minimally displaced intertrochanteric right hip fracture with anatomic alignment. Symmetrical bilateral hip osteoarthritis. No subluxation or dislocation. Diffuse atherosclerosis. IMPRESSION: 1. Minimally displaced intertrochanteric right hip fracture, with anatomic alignment. Electronically Signed   By: Randa Ngo M.D.   On: 03/11/2022 22:20    Cardiac Studies  Echocardiogram completed on 03/12/2022 1. Left ventricular ejection fraction, by estimation, is 40 to 45%. The  left ventricle has mildly decreased function. The left ventricle  demonstrates regional wall motion abnormalities (see scoring  diagram/findings for description). There is moderate  left ventricular hypertrophy. Left ventricular diastolic parameters are  consistent with Grade II diastolic dysfunction (pseudonormalization).  Elevated left atrial pressure. There is severe hypokinesis of the left  ventricular, basal-mid inferoseptal wall  and inferior wall.   2. Right ventricular systolic function is mildly reduced. The right  ventricular size is normal.   3. Left atrial size was mildly dilated.   4. Right atrial size was mildly dilated.   5. The mitral valve is degenerative. Mild mitral valve regurgitation.   6. The aortic valve is tricuspid. There is moderate calcification of the  aortic valve. There is moderate thickening of the aortic valve. Aortic  valve regurgitation is mild. 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  While the mean gradient is only mildly   elevated, the abnormal valve area and dimensionless index calculations  suggest at least moderate to severe low-flow:low-gradient aortic stenosis.  Visual  assessment of leaflet movement suggests that aortic stenosis is not  critical.   7. The inferior vena cava is normal in size with <50% respiratory  variability, suggesting right atrial pressure of 8 mmHg.   Patient Profile     81 y.o. male with a past medical history of HFpEF, CAD s/p CABG x 3 vessels in 2016, atrial fibrillation s/p DCCV, CKD stage III, DM II, HTN, HLD, who was seen and evaluated for surgical clearance.   Assessment & Plan    Acute on chronic systolic and diastolic heart failure -Continued on Entresto postoperatively restarted 03/13/2022 -Ejection fraction 30-35% on echocardiogram in 06/2021 -BNP 739.8 down from January when it was 6866 -Continue Jardiance and Lasix -Daily weights and I's and O's  CAD status post CABG -Denies current chest pain -Continue Imdur  -No ischemic changes noted on EKG -Continue telemetry -EKG PRN for pain or changes  Hypertension -Blood pressure 127/42 -Vital signs per unit protocol  Hyperlipidemia -LDL 3111/08/17 -Continue atorvastatin  Persistent atrial fibrillation status post cardioversion -Mains in sinus bradycardia today -Continue amiodarone -Eliquis restarted postoperatively -CHA2DS2-VASc: 6 -Continue metoprolol  CKD -Serum creatinine today 1.54 -Baseline serum creatinine 1.46 -Daily BMP -Avoid nephrotoxins  DM2 -A1c 8.6 11/22 -management per medicine team      For questions or updates, please contact [facility] Please consult [email or web address] for contact info under        Signed, Brandan Glauber, NP  03/13/2022, 10:46 AM

## 2022-03-13 NOTE — Progress Notes (Signed)
PROGRESS NOTE    Mell Guia  KGU:[id number] DOB: 1941-05-07 DOA: 03/11/2022 PCP: System, Provider Not In  Outpatient Specialists: cardiology    Brief Narrative:   From admission h and p  Nino Amano is a 81 y.o. 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 Per patient he was out tending to his plan when he tripped on wet uneven ground and fell landing on his right hip. Patient states prior to the fall he was in his normal state of health. He denies any sob/chest/pain /n/v/dysuria or diarrhea / fever or chills.   Assessment & Plan:   Principal Problem:   Hip fx (HCC) Active Problems:   Hyperglycemia due to type 2 diabetes mellitus (HCC)   S/P CABG (coronary artery bypass graft)   Essential hypertension   CKD stage 3 due to type 2 diabetes mellitus (HCC)   Chronic HFrEF (heart failure with reduced ejection fraction) (HCC)   Atrial fibrillation (HCC)   Closed intertrochanteric fracture of hip, right, initial encounter [facility])  # Right intertrochanteric hip fracture S/p reduction and internal fixation 7/18 with ortho. Doing well - PT/OT advising home health, will order - pain controlled - bowel regimen - home eliquis - ortho f/u 2 weeks - plan for discharge tomorrow  # CAD # HFrEF Compensated. Appears is off several evidence-based meds - home lasix (need to confirm if takes regularly), jardiance. Not on bb for bradycardia  # CAD s/p CABG Per med rec home plavix and statin on hold, is on eliquis which we are continuing   # A fib, chronic Rate controlled - cont amio, eliquis  # CKD stage 3a Kidney function at baseline  # T2DM Euglycemic to mildly elevated - SSI    DVT prophylaxis: eliquis Code Status: full Family Communication: niece updated telephonically  Level of care: Med-Surg Status is: Inpatient Remains  inpatient appropriate because: unsafe d/c plan    Consultants:  orthopedics  Procedures: See above  Antimicrobials:  none    Subjective: No pain, tolerating diet.   Objective: Vitals:   03/12/22 2028 03/13/22 0522 03/13/22 0736 03/13/22 1156  BP: (!) 124/48 (!) 124/51 (!) 127/42 (!) 121/50  Pulse: (!) 58 (!) 56 (!) 53 (!) 57  Resp: 16 17 18 18   Temp: 97.8 F (36.6 C) 98 F (36.7 C) 98.6 F (37 C) 97.7 F (36.5 C)  TempSrc:   Oral Oral  SpO2: 97% 96% 96% 98%  Weight:      Height:        Intake/Output Summary (Last 24 hours) at 03/13/2022 1350 Last data filed at 03/13/2022 1027 Gross per 24 hour  Intake 1847 ml  Output 1000 ml  Net 847 ml   Filed Weights   03/11/22 2102 03/11/22 2130  Weight: 81.6 kg 81.6 kg    Examination:  General exam: Appears calm and comfortable  Respiratory system: Clear to auscultation. Respiratory effort normal. Cardiovascular system: S1 & S2 heard, RRR. No JVD, murmurs, rubs, gallops or clicks. No pedal edema. Gastrointestinal system: Abdomen is nondistended, soft and nontender. No organomegaly or masses felt. Normal bowel sounds heard. Central nervous system: Alert and oriented. No focal neurological deficits. Extremities: Symmetric 5 x 5 power. Skin: No rashes, lesions or ulcers. Dressing c/d/i Psychiatry: Judgement and insight appear normal. Mood & affect appropriate.     Data Reviewed: I have personally reviewed  following labs and imaging studies  CBC: Recent Labs  Lab 03/11/22 2322 03/12/22 0418 03/13/22 0357  WBC 12.3* 11.2* 9.8  NEUTROABS 9.5*  --   --   HGB 12.7* 12.4* 11.8*  HCT 41.2 40.0 37.2*  MCV 103.5* 103.1* 101.1*  PLT 189 173 [id number]*   Basic Metabolic Panel: Recent Labs  Lab 03/11/22 2322 03/12/22 0418 03/13/22 0357  NA 140 139 137  K 5.1 4.6 4.6  CL 108 111 109  CO2 25 23 25   GLUCOSE 201* 143* 149*  BUN 40* 34* 33*  CREATININE 1.79* 1.46* 1.54*  CALCIUM 8.8* 8.3* 8.4*  MG  --   --  2.3    GFR: Estimated Creatinine Clearance: 42 mL/min (A) (by C-G formula based on SCr of 1.54 mg/dL (H)). Liver Function Tests: No results for input(s): "AST", "ALT", "ALKPHOS", "BILITOT", "PROT", "ALBUMIN" in the last 168 hours. No results for input(s): "LIPASE", "AMYLASE" in the last 168 hours. No results for input(s): "AMMONIA" in the last 168 hours. Coagulation Profile: Recent Labs  Lab 03/11/22 2322  INR 1.2   Cardiac Enzymes: No results for input(s): "CKTOTAL", "CKMB", "CKMBINDEX", "TROPONINI" in the last 168 hours. BNP (last 3 results) No results for input(s): "PROBNP" in the last 8760 hours. HbA1C: Recent Labs    03/12/22 0418  HGBA1C 7.8*   CBG: Recent Labs  Lab 03/12/22 1334 03/12/22 1559 03/12/22 1720 03/13/22 0738 03/13/22 1205  GLUCAP 130* 131* 133* 157* 224*   Lipid Profile: No results for input(s): "CHOL", "HDL", "LDLCALC", "TRIG", "CHOLHDL", "LDLDIRECT" in the last 72 hours. Thyroid Function Tests: No results for input(s): "TSH", "T4TOTAL", "FREET4", "T3FREE", "THYROIDAB" in the last 72 hours. Anemia Panel: No results for input(s): "VITAMINB12", "FOLATE", "FERRITIN", "TIBC", "IRON", "RETICCTPCT" in the last 72 hours. Urine analysis: No results found for: "COLORURINE", "APPEARANCEUR", "LABSPEC", "PHURINE", "GLUCOSEU", "HGBUR", "BILIRUBINUR", "KETONESUR", "PROTEINUR", "UROBILINOGEN", "NITRITE", "LEUKOCYTESUR" Sepsis Labs: @LABRCNTIP (procalcitonin:4,lacticidven:4)  )No results found for this or any previous visit (from the past 240 hour(s)).       Radiology Studies: DG FEMUR, MIN 2 VIEWS RIGHT  Result Date: 03/12/2022 CLINICAL DATA:  Nail fixation of right femur EXAM: RIGHT FEMUR 2 VIEWS COMPARISON:  03/11/2022 FINDINGS: Four fluoroscopic images are obtained during the performance of the procedure and are provided for interpretation only. Images demonstrate intramedullary nail fixation of a right intertrochanteric femoral fracture. Fracture components  are in near anatomic alignment. Fluoroscopy time: 1 minute 10 seconds mGy IMPRESSION: IM nail fixation of the right intertrochanteric femur fracture. Electronically Signed   By: Merilyn Baba M.D.   On: 03/12/2022 16:13   DG C-Arm 1-60 Min-No Report  Result Date: 03/12/2022 Fluoroscopy was utilized by the requesting physician.  No radiographic interpretation.   DG C-Arm 1-60 Min-No Report  Result Date: 03/12/2022 Fluoroscopy was utilized by the requesting physician.  No radiographic interpretation.   ECHOCARDIOGRAM COMPLETE  Result Date: 03/12/2022    ECHOCARDIOGRAM REPORT   Patient Name:   STALIN HOULTON Date of Exam: 03/12/2022 Medical Rec #:  [id number]    Height:       72.0 in Accession #:    [id number]   Weight:       180.0 lb Date of Birth:  11-25-1940    BSA:          2.037 m Patient Age:    43 years     BP:           129/57 mmHg Patient Gender: M  HR:           47 bpm. Exam Location:  [facility] Procedure: 2D Echo, Cardiac Doppler and Color Doppler Indications:     Preoperative evaluation  History:         Patient has prior history of Echocardiogram examinations, most                  recent 07/10/2021. CHF and Cardiomyopathy, CAD and Previous                  Myocardial Infarction, Prior CABG, Arrythmias:Atrial                  Fibrillation; Risk Factors:Former Smoker, Diabetes,                  Hypertension and Dyslipidemia.  Sonographer:     Ceasar Mons Referring Phys:  [phone] CHRISTOPHER END Diagnosing Phys: Yvonne Kendall MD IMPRESSIONS  1. Left ventricular ejection fraction, by estimation, is 40 to 45%. The left ventricle has mildly decreased function. The left ventricle demonstrates regional wall motion abnormalities (see scoring diagram/findings for description). There is moderate left ventricular hypertrophy. Left ventricular diastolic parameters are consistent with Grade II diastolic dysfunction (pseudonormalization). Elevated left atrial pressure. There is severe hypokinesis of the  left ventricular, basal-mid inferoseptal wall and inferior wall.  2. Right ventricular systolic function is mildly reduced. The right ventricular size is normal.  3. Left atrial size was mildly dilated.  4. Right atrial size was mildly dilated.  5. The mitral valve is degenerative. Mild mitral valve regurgitation.  6. The aortic valve is tricuspid. There is moderate calcification of the aortic valve. There is moderate thickening of the aortic valve. Aortic valve regurgitation is mild. 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 While the mean gradient is only mildly  elevated, the abnormal valve area and dimensionless index calculations suggest at least moderate to severe low-flow:low-gradient aortic stenosis. Visual assessment of leaflet movement suggests that aortic stenosis is not critical.  7. The inferior vena cava is normal in size with <50% respiratory variability, suggesting right atrial pressure of 8 mmHg. FINDINGS  Left Ventricle: Left ventricular ejection fraction, by estimation, is 40 to 45%. The left ventricle has mildly decreased function. The left ventricle demonstrates regional wall motion abnormalities. Severe hypokinesis of the left ventricular, basal-mid inferoseptal wall and inferior wall. The left ventricular internal cavity size was normal in size. There is moderate left ventricular hypertrophy. Left ventricular diastolic parameters are consistent with Grade II diastolic dysfunction (pseudonormalization). Elevated left atrial pressure. Right Ventricle: The right ventricular size is normal. No increase in right ventricular wall thickness. Right ventricular systolic function is mildly reduced. Left Atrium: Left atrial size was mildly dilated. Right Atrium: Right atrial size was mildly dilated. Pericardium: There is no evidence of pericardial effusion. Mitral Valve: The mitral valve is degenerative in appearance. There is mild thickening of the mitral valve leaflet(s). Mild mitral annular  calcification. Mild mitral valve regurgitation. Tricuspid Valve: The tricuspid valve is normal in structure. Tricuspid valve regurgitation is trivial. Aortic Valve: The aortic valve is tricuspid. There is moderate calcification of the aortic valve. There is moderate thickening of the aortic valve. Aortic valve regurgitation is mild. Aortic regurgitation PHT measures 913 msec. Moderate to severe aortic stenosis is present. Aortic valve mean gradient measures 14.0 mmHg. Aortic valve peak gradient measures 24.2 mmHg. Aortic valve area, by VTI measures 0.59 cm. Pulmonic Valve: The pulmonic valve was grossly normal. Pulmonic valve regurgitation is trivial. No  evidence of pulmonic stenosis. Aorta: The aortic root is normal in size and structure. Venous: The inferior vena cava is normal in size with less than 50% respiratory variability, suggesting right atrial pressure of 8 mmHg. IAS/Shunts: No atrial level shunt detected by color flow Doppler.  LEFT VENTRICLE PLAX 2D LVIDd:         4.47 cm   Diastology LVIDs:         3.49 cm   LV e' medial:    4.78 cm/s LV PW:         1.40 cm   LV E/e' medial:  20.9 LV IVS:        1.20 cm   LV e' lateral:   6.83 cm/s LVOT diam:     1.90 cm   LV E/e' lateral: 14.6 LV SV:         43 LV SV Index:   21 LVOT Area:     2.84 cm  RIGHT VENTRICLE RV Basal diam:  3.21 cm RV S prime:     9.65 cm/s TAPSE (M-mode): 1.6 cm LEFT ATRIUM             Index        RIGHT ATRIUM           Index LA diam:        4.90 cm 2.41 cm/m   RA Area:     18.40 cm LA Vol (A2C):   60.5 ml 29.70 ml/m  RA Volume:   49.50 ml  24.30 ml/m LA Vol (A4C):   56.4 ml 27.68 ml/m LA Biplane Vol: 59.2 ml 29.06 ml/m  AORTIC VALVE AV Area (Vmax):    0.59 cm AV Area (Vmean):   0.60 cm AV Area (VTI):     0.59 cm AV Vmax:           246.00 cm/s AV Vmean:          174.000 cm/s AV VTI:            0.730 m AV Peak Grad:      24.2 mmHg AV Mean Grad:      14.0 mmHg LVOT Vmax:         50.80 cm/s LVOT Vmean:        37.100 cm/s LVOT VTI:           0.152 m LVOT/AV VTI ratio: 0.21 AI PHT:            913 msec  AORTA Ao Root diam: 3.40 cm MITRAL VALVE MV Area (PHT): 3.70 cm     SHUNTS MV Decel Time: 205 msec     Systemic VTI:  0.15 m MV E velocity: 100.00 cm/s  Systemic Diam: 1.90 cm MV A velocity: 56.90 cm/s MV E/A ratio:  1.76 Cristal Deer End MD Electronically signed by Yvonne Kendall MD Signature Date/Time: 03/12/2022/12:36:49 PM    Final    Chest Portable 1 View  Result Date: 03/12/2022 CLINICAL DATA:  Hip fracture. EXAM: PORTABLE CHEST 1 VIEW COMPARISON:  08/05/2021 FINDINGS: Post median sternotomy. Stable mild cardiomegaly. Unchanged mediastinal contours. Aortic atherosclerosis. Mild vascular congestion. Chronic interstitial coarsening. No focal airspace disease, pleural effusion or pneumothorax. No acute osseous abnormalities are seen. IMPRESSION: Stable mild cardiomegaly.  Mild vascular congestion. Electronically Signed   By: Narda Rutherford M.D.   On: 03/12/2022 01:42   CT HEAD WO CONTRAST ( )  Result Date: 03/12/2022 CLINICAL DATA:  Head trauma. EXAM: CT HEAD WITHOUT CONTRAST TECHNIQUE: Contiguous axial images were obtained from  the base of the skull through the vertex without intravenous contrast. RADIATION DOSE REDUCTION: This exam was performed according to the departmental dose-optimization program which includes automated exposure control, adjustment of the mA and/or kV according to patient size and/or use of iterative reconstruction technique. COMPARISON:  Head CT dated 06/08/2006. FINDINGS: Brain: Mild age-related atrophy and chronic microvascular ischemic changes. 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 There is no acute intracranial hemorrhage. No mass effect or midline shift. No extra-axial fluid collection. Vascular: No hyperdense vessel or unexpected calcification. Skull: Normal. Negative for fracture or focal lesion. Sinuses/Orbits: No acute finding. Other: None IMPRESSION: 1. No acute intracranial  pathology. 2. Mild age-related atrophy and chronic microvascular ischemic changes. Electronically Signed   By: Anner Crete M.D.   On: 03/12/2022 01:25   DG Hip Unilat With Pelvis 2-3 Views Right  Result Date: 03/11/2022 CLINICAL DATA:  [place], right hip injury EXAM: DG HIP (WITH OR WITHOUT PELVIS) 2-3V RIGHT COMPARISON:  None Available. FINDINGS: Frontal view of the pelvis as well as frontal and frogleg lateral views of the right hip are obtained. There is a minimally displaced intertrochanteric right hip fracture with anatomic alignment. Symmetrical bilateral hip osteoarthritis. No subluxation or dislocation. Diffuse atherosclerosis. IMPRESSION: 1. Minimally displaced intertrochanteric right hip fracture, with anatomic alignment. Electronically Signed   By: Randa Ngo M.D.   On: 03/11/2022 22:20        Scheduled Meds:  acetaminophen  500 mg Oral Q6H   amiodarone  200 mg Oral Daily   apixaban  5 mg Oral BID   docusate sodium  100 mg Oral BID   empagliflozin  25 mg Oral q morning   furosemide  40 mg Intravenous q morning   insulin aspart  0-6 Units Subcutaneous TID WC   latanoprost  1 drop Both Eyes QHS   multivitamin with minerals  1 tablet Oral Daily   pantoprazole  40 mg Oral Daily   sacubitril-valsartan  1 tablet Oral Q12H   timolol  1 drop Both Eyes Daily   Continuous Infusions:  sodium chloride Stopped (03/13/22 0730)    ceFAZolin (ANCEF) IV       LOS: 1 day     Desma Maxim, MD [facility]   If 7PM-7AM, please contact night-coverage [email or web address] Password [email or web address] 03/13/2022, 1:50 PM

## 2022-03-13 NOTE — Progress Notes (Signed)
Physical Therapy Treatment Patient Details Name: Deshannon Hinchliffe MRN: [id number] DOB: 1941/03/02 Today's Date: 03/13/2022   History of Present Illness Mackey Varricchio is a 81 y.o. 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 Per patient he was out tending to his plants when he tripped on wet uneven ground and fell landing on his right hip. Patient states prior to the fall he was in his normal state of health. He denies any sob/chest/pain /n/v/dysuria or diarrhea / fever or chills. S/P R femur ORIF.    PT Comments    Patient with increased stiffness and soreness this pm. Required increased time and min guard for bed mobility/transfers. Was able to walk 150 feet with RW min guard. Instructed in bed exercises to help with pain and stiffness. He will continue to benefit from skilled PT to improve independence. He is going to stay with his niece at discharge. Needs HHPT and RW.      Recommendations for follow up therapy are one component of a multi-disciplinary discharge planning process, led by the attending physician.  Recommendations may be updated based on patient status, additional functional criteria and insurance authorization.  Follow Up Recommendations  Home health PT     Assistance Recommended at Discharge Intermittent Supervision/Assistance  Patient can return home with the following A little help with walking and/or transfers;A little help with bathing/dressing/bathroom;Assist for transportation;Help with stairs or ramp for entrance;Assistance with cooking/housework   Equipment Recommendations  Rolling walker (2 wheels)    Recommendations for Other Services       Precautions / Restrictions Precautions Precautions: Fall Restrictions Weight Bearing Restrictions: Yes RLE Weight Bearing: Weight bearing as tolerated     Mobility  Bed  Mobility Overal bed mobility: Modified Independent             General bed mobility comments: increased time and use of bed railings    Transfers Overall transfer level: Needs assistance Equipment used: Rolling walker (2 wheels) Transfers: Sit to/from Stand Sit to Stand: Min guard, From elevated surface           General transfer comment: increased stiffness and soreness this pm    Ambulation/Gait Ambulation/Gait assistance: Min guard Gait Distance (Feet): 150 Feet Assistive device: Rolling walker (2 wheels) Gait Pattern/deviations: Step-to pattern, Step-through pattern, Decreased step length - right, Decreased step length - left, Decreased weight shift to right Gait velocity: slightly slowed     General Gait Details: cues for proximity to [employer]    Modified Rankin (Stroke Patients Only)       Balance Overall balance assessment: Needs assistance Sitting-balance support: Feet supported Sitting balance-Leahy Scale: Good     Standing balance support: Bilateral upper extremity supported, During functional activity, Reliant on assistive device for balance Standing balance-Leahy Scale: Good                              Cognition Arousal/Alertness: Awake/alert Behavior During Therapy: WFL for tasks assessed/performed Overall Cognitive Status: Within Functional Limits for tasks assessed  Exercises Total Joint Exercises Ankle Circles/Pumps: AROM, 10 reps, Both Heel Slides: AROM, Right, 5 reps Hip ABduction/ADduction: AAROM, Right, 5 reps    General Comments        Pertinent Vitals/Pain Pain Assessment Pain Assessment: 0-10 Faces Pain Scale: Hurts little more Pain Location: R hip Pain Descriptors / Indicators: Discomfort, Grimacing, Guarding Pain Intervention(s): Monitored during session, Repositioned    Home Living Family/patient  expects to be discharged to:: Private residence Living Arrangements: Alone Available Help at Discharge: Family;Friend(s);Available PRN/intermittently Type of Home: House Home Access: Stairs to enter   Entrance Stairs-Number of Steps: 3   Home Layout: One level Home Equipment: None      Prior Function            PT Goals (current goals can now be found in the care plan section) Acute Rehab PT Goals Patient Stated Goal: to get back to full independence PT Goal Formulation: With patient Time For Goal Achievement: 03/20/22 Potential to Achieve Goals: Good Progress towards PT goals: Progressing toward goals    Frequency    BID      PT Plan Current plan remains appropriate    Co-evaluation              AM-PAC PT "6 Clicks" Mobility   Outcome Measure  Help needed turning from your back to your side while in a flat bed without using bedrails?: A Little Help needed moving from lying on your back to sitting on the side of a flat bed without using bedrails?: A Little Help needed moving to and from a bed to a chair (including a wheelchair)?: A Little Help needed standing up from a chair using your arms (e.g., wheelchair or bedside chair)?: A Little Help needed to walk in hospital room?: A Little Help needed climbing 3-5 steps with a railing? : A Little 6 Click Score: 18    End of Session Equipment Utilized During Treatment: Gait belt Activity Tolerance: Patient tolerated treatment well Patient left: in bed;with call bell/phone within reach;with bed alarm set Nurse Communication: Mobility status PT Visit Diagnosis: Other abnormalities of gait and mobility (R26.89);Muscle weakness (generalized) (M62.81);Difficulty in walking, not elsewhere classified (R26.2);Pain;History of falling (Z91.81) Pain - Right/Left: Right Pain - part of body: Leg;Hip     Time: 1430-1455 PT Time Calculation (min) (ACUTE ONLY): 25 min  Charges:  $Gait Training: 8-22 mins $Therapeutic  Exercise: 8-22 mins                     [employer], PT, GCS 03/13/22,3:00 PM

## 2022-03-14 LAB — CBC
HCT: 33.8 % — ABNORMAL LOW (ref 39.0–52.0)
Hemoglobin: 11 g/dL — ABNORMAL LOW (ref 13.0–17.0)
MCH: 32.4 pg (ref 26.0–34.0)
MCHC: 32.5 g/dL (ref 30.0–36.0)
MCV: 99.7 fL (ref 80.0–100.0)
Platelets: 128 10*3/uL — ABNORMAL LOW (ref 150–400)
RBC: 3.39 MIL/uL — ABNORMAL LOW (ref 4.22–5.81)
RDW: 14.2 % (ref 11.5–15.5)
WBC: 8.2 10*3/uL (ref 4.0–10.5)
nRBC: 0 % (ref 0.0–0.2)

## 2022-03-14 LAB — BASIC METABOLIC PANEL
Anion gap: 5 (ref 5–15)
BUN: 32 mg/dL — ABNORMAL HIGH (ref 8–23)
CO2: 25 mmol/L (ref 22–32)
Calcium: 8.5 mg/dL — ABNORMAL LOW (ref 8.9–10.3)
Chloride: 108 mmol/L (ref 98–111)
Creatinine, Ser: 1.38 mg/dL — ABNORMAL HIGH (ref 0.61–1.24)
GFR, Estimated: 52 mL/min — ABNORMAL LOW (ref >60)
Glucose, Bld: 150 mg/dL — ABNORMAL HIGH (ref 70–99)
Potassium: 4.4 mmol/L (ref 3.5–5.1)
Sodium: 138 mmol/L (ref 135–145)

## 2022-03-14 LAB — GLUCOSE, CAPILLARY
Glucose-Capillary: 154 mg/dL — ABNORMAL HIGH (ref 70–99)
Glucose-Capillary: 150 mg/dL — ABNORMAL HIGH (ref 70–99)

## 2022-03-14 MED ORDER — POLYETHYLENE GLYCOL 3350 17 G PO PACK: 17.0000 g | PACK | Freq: Every day | ORAL | 0 refills | Status: DC

## 2022-03-14 MED ORDER — HYDROCODONE-ACETAMINOPHEN 5-325 MG PO TABS
1.0000 | ORAL_TABLET | Freq: Four times a day (QID) | ORAL | 0 refills | Status: DC | PRN
Start: 2022-03-14 — End: 2022-09-16

## 2022-03-14 MED ORDER — HYDROCODONE-ACETAMINOPHEN 5-325 MG PO TABS: 1.0000 | ORAL_TABLET | Freq: Four times a day (QID) | ORAL | 0 refills | Status: DC | PRN

## 2022-03-14 NOTE — Progress Notes (Signed)
Physical Therapy Treatment Patient Details Name: Bradley [name] MRN: [id number] DOB: [birth year] Today's Date: 03/14/2022   History of Present Illness Bradley [name] is a 81 y.o. 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 Per patient he was out tending to his plants when he tripped on wet uneven ground and fell landing on his right hip. Patient states prior to the fall he was in his normal state of health. He denies any sob/chest/pain /n/v/dysuria or diarrhea / fever or chills. S/P R femur ORIF.    PT Comments    Patient received at end of OT session. He is reporting 10/10 pain and had received pain meds 30 min prior to session. He is min guard/supervision for transfers. Cues for hand placement. Ambulated 150 feet with RW and supervision. Pain limited. Ambulated up/down 4 steps with one rail and min guard. Patient continues to be progressing with mobility and will continue to benefit from skilled PT to improve strength, independence and safety with mobility.       Recommendations for follow up therapy are one component of a multi-disciplinary discharge planning process, led by the attending physician.  Recommendations may be updated based on patient status, additional functional criteria and insurance authorization.  Follow Up Recommendations  Home health PT     Assistance Recommended at Discharge Intermittent Supervision/Assistance  Patient can return home with the following A little help with walking and/or transfers;A little help with bathing/dressing/bathroom;Assist for transportation;Help with stairs or ramp for entrance;Assistance with cooking/housework   Equipment Recommendations  Rolling walker (2 wheels)    Recommendations for Other Services       Precautions / Restrictions Precautions Precautions: Fall Restrictions Weight Bearing  Restrictions: Yes RLE Weight Bearing: Weight bearing as tolerated     Mobility  Bed Mobility               General bed mobility comments: patient received sitting up on side of bed after OT session    Transfers Overall transfer level: Needs assistance Equipment used: Rolling walker (2 wheels) Transfers: Sit to/from Stand Sit to Stand: Supervision                Ambulation/Gait Ambulation/Gait assistance: Min guard Gait Distance (Feet): 150 Feet Assistive device: Rolling walker (2 wheels) Gait Pattern/deviations: Step-to pattern, Decreased step length - right, Decreased weight shift to right Gait velocity: slightly slowed     General Gait Details: cues for proximity to RW   Stairs Stairs: Yes Stairs assistance: Min guard Stair Management: One rail Left, Step to pattern Number of Stairs: 4 General stair comments: increased difficulty descending steps, requiring closer supervision   Wheelchair Mobility    Modified Rankin (Stroke Patients Only)       Balance Overall balance assessment: Modified Independent Sitting-balance support: Feet supported Sitting balance-Leahy Scale: Good     Standing balance support: Bilateral upper extremity supported, During functional activity, Reliant on assistive device for balance Standing balance-Leahy Scale: Good                              Cognition Arousal/Alertness: Awake/alert Behavior During Therapy: WFL for tasks assessed/performed Overall Cognitive Status: Within Functional Limits for tasks assessed  Exercises      General Comments        Pertinent Vitals/Pain Pain Assessment Pain Assessment: 0-10 Pain Location: R hip Pain Descriptors / Indicators: Aching, Discomfort, Grimacing, Guarding Pain Intervention(s): Monitored during session, Premedicated before session, Repositioned    Home Living                           Prior Function            PT Goals (current goals can now be found in the care plan section) Acute Rehab PT Goals Patient Stated Goal: to get back to full independence PT Goal Formulation: With patient Time For Goal Achievement: 03/20/22 Potential to Achieve Goals: Good Progress towards PT goals: Progressing toward goals    Frequency    BID      PT Plan Current plan remains appropriate    Co-evaluation              AM-PAC PT "6 Clicks" Mobility   Outcome Measure  Help needed turning from your back to your side while in a flat bed without using bedrails?: A Little Help needed moving from lying on your back to sitting on the side of a flat bed without using bedrails?: A Little Help needed moving to and from a bed to a chair (including a wheelchair)?: A Little Help needed standing up from a chair using your arms (e.g., wheelchair or bedside chair)?: A Little Help needed to walk in hospital room?: A Little Help needed climbing 3-5 steps with a railing? : A Little 6 Click Score: 18    End of Session Equipment Utilized During Treatment: Gait belt Activity Tolerance: Patient limited by pain Patient left: in bed;with call bell/phone within reach;with family/visitor present Nurse Communication: Mobility status PT Visit Diagnosis: Other abnormalities of gait and mobility (R26.89);Pain;History of falling (Z91.81);Muscle weakness (generalized) (M62.81);Difficulty in walking, not elsewhere classified (R26.2) Pain - Right/Left: Right Pain - part of body: Hip     Time: 1115-1150 PT Time Calculation (min) (ACUTE ONLY): 35 min  Charges:  $Gait Training: 23-37 mins                     Kashaun Bebo, PT, GCS 03/14/22,12:00 PM

## 2022-03-14 NOTE — TOC Progression Note (Signed)
Transition of Care [facility]) - Progression Note    Patient Details  Name: Bradley [name] MRN: [id number] Date of Birth: 1941/07/11  Transition of [facility] CM/SW Contact  Maree Krabbe, LCSW Phone Number: 03/14/2022, 11:32 AM  Clinical Narrative:   CSW spoke with pt's Niece and explained that [facility] can service. Pt's Niece states pt will be living with her in [facility]. [facility] can service that area. Barbara Cower with [facility] will go talk to Niece.         Expected Discharge Plan and Services                                                 Social Determinants of Health (SDOH) Interventions    Readmission Risk Interventions     No data to display

## 2022-03-14 NOTE — Progress Notes (Signed)
  Subjective: 2 Days Post-Op Procedure(s) (LRB): INTRAMEDULLARY (IM) NAIL INTERTROCHANTRIC (Right) Patient reports pain as moderate, more severe than yesterday after completion of therapy. Patient is well, and has had no acute complaints or problems Plan is for discharge home with home health therapy. Negative for chest pain and shortness of breath Fever: no Gastrointestinal:Negative for nausea and vomiting States that he is passing gas.  Is urinating well.  Objective: Vital signs in last 24 hours: Temp:  [97.7 F (36.5 C)-98.9 F (37.2 C)] 98.7 F (37.1 C) (07/20 0801) Pulse Rate:  [57-70] 62 (07/20 0801) Resp:  [16-18] 16 (07/20 0801) BP: (121-141)/(50-65) 141/51 (07/20 0801) SpO2:  [94 %-98 %] 96 % (07/20 0801)  Intake/Output from previous day:  Intake/Output Summary (Last 24 hours) at 03/14/2022 1121 Last data filed at 03/14/2022 1012 Gross per 24 hour  Intake 360 ml  Output 2550 ml  Net -2190 ml    Intake/Output this shift: Total I/O In: 240 [P.O.:240] Out: 500 [Urine:500]  Labs: Recent Labs    03/11/22 2322 03/12/22 0418 03/13/22 0357 03/14/22 0523  HGB 12.7* 12.4* 11.8* 11.0*   Recent Labs    03/13/22 0357 03/14/22 0523  WBC 9.8 8.2  RBC 3.68* 3.39*  HCT 37.2* 33.8*  PLT 148* 128*   Recent Labs    03/13/22 0357 03/14/22 0523  NA 137 138  K 4.6 4.4  CL 109 108  CO2 25 25  BUN 33* 32*  CREATININE 1.54* 1.38*  GLUCOSE 149* 150*  CALCIUM 8.4* 8.5*   Recent Labs    03/11/22 2322  INR 1.2     EXAM General - Patient is Alert, Appropriate, and Oriented Extremity - ABD soft Neurovascular intact Dorsiflexion/Plantar flexion intact Incision: dressing C/D/I No cellulitis present Dressing/Incision - clean, dry, no drainage noted to the right leg Motor Function - intact, moving foot and toes well on exam.  Abdomen soft with intact bowel sounds. Thigh soft to palpation without significant swelling.  Past Medical History:  Diagnosis Date    (HFpEF) heart failure with preserved ejection fraction (HCC)    Coronary artery disease    Diabetes mellitus without complication (HCC)    Hyperlipidemia    Hypertension     Assessment/Plan: 2 Days Post-Op Procedure(s) (LRB): INTRAMEDULLARY (IM) NAIL INTERTROCHANTRIC (Right) Principal Problem:   Hip fx (HCC) Active Problems:   Hyperglycemia due to type 2 diabetes mellitus (HCC)   S/P CABG (coronary artery bypass graft)   Essential hypertension   CKD stage 3 due to type 2 diabetes mellitus (HCC)   Chronic HFrEF (heart failure with reduced ejection fraction) (HCC)   Atrial fibrillation (HCC)   Closed intertrochanteric fracture of hip, right, initial encounter (HCC)  Estimated body mass index is 24.41 kg/m as calculated from the following:   Height as of this encounter: 6' (1.829 m).   Weight as of this encounter: 81.6 kg. Advance diet Up with therapy D/C IV fluids when tolerating po intake.  Labs reviewed this AM, Hg 11.0, HCT 33.8 Continue with therapy today, plan for discharge home today with HHPT.  Follow-up with [facility] orthopaedics in 14 days after discharge for x-rays and staple removal. Continue with chronic Eliquis and Plavix for DVT prophylaxis.  DVT Prophylaxis - Foot Pumps and Eliquis Weight-Bearing as tolerated to right leg  J. Horris Latino, PA-C [facility] 03/14/2022, 11:21 AM

## 2022-03-14 NOTE — Discharge Summary (Signed)
Bradley [name] [id number] DOB: 29-Aug-1940 DOA: 03/11/2022  PCP: System, Provider Not In  Admit date: 03/11/2022 Discharge date: 03/14/2022  Time spent: 14 minutes  Recommendations for Outpatient Follow-up:  Ortho f/u 2 weeks Consider outpt treatment of osteoporosis     Discharge Diagnoses:  Principal Problem:   Hip fx ([place]) Active Problems:   Hyperglycemia due to type 2 diabetes mellitus ([place])   S/P CABG (coronary artery bypass graft)   Essential hypertension   CKD stage 3 due to type 2 diabetes mellitus ([place])   Chronic HFrEF (heart failure with reduced ejection fraction) ([place])   Atrial fibrillation ([place])   Closed intertrochanteric fracture of hip, right, initial encounter [facility])   Discharge Condition: stable  Diet recommendation: heart healthy  Filed Weights   03/11/22 2102 03/11/22 2130  Weight: 81.6 kg 81.6 kg    History of present illness:  From admission h and p  Bradley [name] is a 81 y.o. 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 Per patient he was out tending to his plan when he tripped on wet uneven ground and fell landing on his right hip. Patient states prior to the fall he was in his normal state of health. He denies any sob/chest/pain /n/v/dysuria or diarrhea / fever or chills.  Hospital Course:  # Right intertrochanteric hip fracture S/p reduction and internal fixation 7/18 with ortho. Doing well - PT/OT advising home health, will order - pain controlled - bowel regimen - home eliquis - ortho f/u 2 weeks   # CAD # HFrEF Compensated.  - home meds continued   # A fib, chronic Rate controlled - cont amio, eliquis   # CKD stage 3a Kidney function at baseline   # T2DM Euglycemic to mildly elevated - SSI    Procedures: See above   Consultations: orthopedics  Discharge Exam: Vitals:    03/14/22 0314 03/14/22 0801  BP: (!) 123/52 (!) 141/51  Pulse: 63 62  Resp: 18 16  Temp: 98.9 F (37.2 C) 98.7 F (37.1 C)  SpO2: 97% 96%    General exam: Appears calm and comfortable  Respiratory system: Clear to auscultation. Respiratory effort normal. Cardiovascular system: S1 & S2 heard, RRR. No JVD, murmurs, rubs, gallops or clicks. No pedal edema. Gastrointestinal system: Abdomen is nondistended, soft and nontender. No organomegaly or masses felt. Normal bowel sounds heard. Central nervous system: Alert and oriented. No focal neurological deficits. Extremities: Symmetric 5 x 5 power. Skin: No rashes, lesions or ulcers. Dressing c/d/i Psychiatry: Judgement and insight appear normal. Mood & affect appropriate.   Discharge Instructions   Discharge Instructions     Diet - low sodium heart healthy   Complete by: As directed    Face-to-face encounter (required for Medicare/Medicaid patients)   Complete by: As directed    I Desma Maxim certify that this patient is under my care and that I, or a nurse practitioner or physician's assistant working with me, had a face-to-face encounter that meets the physician face-to-face encounter requirements with this patient on 03/14/2022. The encounter with the patient was in whole, or in part for the following medical condition(s) which is the primary reason for home health care (List medical condition): hip fracture   The encounter with the patient was in whole, or in part, for the following medical condition, which is the primary reason for home  health care: hip fracture   I certify that, based on my findings, the following services are medically necessary home health services: Physical therapy   Reason for Medically Necessary Home Health Services: Therapy- Therapeutic Exercises to Increase Strength and Endurance   My clinical findings support the need for the above services: Pain interferes with ambulation/mobility   Further, I certify that my  clinical findings support that this patient is homebound due to: Pain interferes with ambulation/mobility   Home Health   Complete by: As directed    To provide the following care/treatments:  PT OT     Increase activity slowly   Complete by: As directed    Leave dressing on - Keep it clean, dry, and intact until clinic visit   Complete by: As directed       Allergies as of 03/14/2022       Reactions   Simvastatin         Medication List     STOP taking these medications    diclofenac 75 MG EC tablet Commonly known as: VOLTAREN   indomethacin 50 MG capsule Commonly known as: INDOCIN   meloxicam 7.5 MG tablet Commonly known as: MOBIC   traMADol 50 MG tablet Commonly known as: ULTRAM       TAKE these medications    acetaminophen 325 MG tablet Commonly known as: TYLENOL Take 650-1,300 mg by mouth 2 (two) times daily as needed.   amiodarone 200 MG tablet Commonly known as: PACERONE Take 200 mg by mouth daily.   apixaban 5 MG Tabs tablet Commonly known as: ELIQUIS Take 1 tablet (5 mg total) by mouth 2 (two) times daily.   atorvastatin 80 MG tablet Commonly known as: LIPITOR Take 40 mg by mouth daily.   Brinzolamide-Brimonidine 1-0.2 % Susp Place 1 drop into the left eye 3 (three) times daily.   carboxymethylcellulose 1 % ophthalmic solution Apply 1 drop to eye at bedtime.   clopidogrel 75 MG tablet Commonly known as: PLAVIX Take 1 tablet (75 mg total) by mouth daily with breakfast.   cyanocobalamin 500 MCG tablet Commonly known as: CYANOCOBALAMIN Take 1,000 mcg by mouth daily. Before breakfast   empagliflozin 25 MG Tabs tablet Commonly known as: JARDIANCE Take 12.5 mg by mouth every morning.   furosemide 40 MG tablet Commonly known as: LASIX Take 1 tablet (40 mg total) by mouth daily.   glipiZIDE 5 MG tablet Commonly known as: GLUCOTROL Take by mouth.   HYDROcodone-acetaminophen 5-325 MG tablet Commonly known as: NORCO/VICODIN Take 1-2  tablets by mouth every 6 (six) hours as needed for moderate pain.   isosorbide mononitrate 60 MG 24 hr tablet Commonly known as: IMDUR Take 1 tablet (60 mg total) by mouth daily.   metoprolol succinate 50 MG 24 hr tablet Commonly known as: TOPROL-XL Take by mouth.   nitroGLYCERIN 0.4 MG SL tablet Commonly known as: NITROSTAT Place 1 tablet (0.4 mg total) under the tongue every 5 (five) minutes x 3 doses as needed for chest pain.   pantoprazole 40 MG tablet Commonly known as: PROTONIX Take 40 mg by mouth daily.   polyethylene glycol 17 g packet Commonly known as: MIRALAX / GLYCOLAX Take 17 g by mouth daily. Start taking on: March 15, 2022   Propylene Glycol 0.6 % Soln Place 1 drop into both eyes 4 (four) times daily.   sacubitril-valsartan 24-26 MG Commonly known as: ENTRESTO Take 1 tablet by mouth every 12 (twelve) hours.   spironolactone 25 MG tablet Commonly known  as: ALDACTONE Take 1 tablet by mouth daily.   timolol 0.5 % ophthalmic gel-forming Commonly known as: TIMOPTIC-XR Place 1 drop into both eyes daily.   travoprost (benzalkonium) 0.004 % ophthalmic solution Commonly known as: TRAVATAN Place 1 drop into both eyes nightly.   valsartan 40 MG tablet Commonly known as: DIOVAN Take by mouth.               Durable Medical Equipment  (From admission, onward)           Start     Ordered   03/13/22 1508  For home use only DME Walker rolling  Once       Question Answer Comment  Walker: With Murphy Wheels   Patient needs a walker to treat with the following condition Generalized weakness      03/13/22 1508              Discharge Care Instructions  (From admission, onward)           Start     Ordered   03/14/22 0000  Leave dressing on - Keep it clean, dry, and intact until clinic visit        03/14/22 1247           Allergies  Allergen Reactions   Simvastatin     Follow-up Information     Kassem, Cacy, PA-C Follow up  in 14 day(s).   Specialty: Physician Assistant Why: Staple Removal and x-rays. Contact information: [place] [address] [phone]                  The results of significant diagnostics from this hospitalization (including imaging, microbiology, ancillary and laboratory) are listed below for reference.    Significant Diagnostic Studies: DG FEMUR, MIN 2 VIEWS RIGHT  Result Date: 03/12/2022 CLINICAL DATA:  Nail fixation of right femur EXAM: RIGHT FEMUR 2 VIEWS COMPARISON:  03/11/2022 FINDINGS: Four fluoroscopic images are obtained during the performance of the procedure and are provided for interpretation only. Images demonstrate intramedullary nail fixation of a right intertrochanteric femoral fracture. Fracture components are in near anatomic alignment. Fluoroscopy time: 1 minute 10 seconds mGy IMPRESSION: IM nail fixation of the right intertrochanteric femur fracture. Electronically Signed   By: Merilyn Baba M.D.   On: 03/12/2022 16:13   DG C-Arm 1-60 Min-No Report  Result Date: 03/12/2022 Fluoroscopy was utilized by the requesting physician.  No radiographic interpretation.   DG C-Arm 1-60 Min-No Report  Result Date: 03/12/2022 Fluoroscopy was utilized by the requesting physician.  No radiographic interpretation.   ECHOCARDIOGRAM COMPLETE  Result Date: 03/12/2022    ECHOCARDIOGRAM REPORT   Patient Name:   JUSTICE SAWAYA Date of Exam: 03/12/2022 Medical Rec #:  [id number]    Height:       72.0 in Accession #:    [id number]   Weight:       180.0 lb Date of Birth:  [birth year]    BSA:          2.037 m Patient Age:    72 years     BP:           129/57 mmHg Patient Gender: M            HR:           47 bpm. Exam Location:  [facility] Procedure: 2D Echo, Cardiac Doppler and Color Doppler Indications:     Preoperative evaluation  History:  Patient has prior history of Echocardiogram examinations, most                  recent 07/10/2021. CHF and Cardiomyopathy,  CAD and Previous                  Myocardial Infarction, Prior CABG, Arrythmias:Atrial                  Fibrillation; Risk Factors:Former Smoker, Diabetes,                  Hypertension and Dyslipidemia.  Sonographer:     Rosalia Hammers Referring Phys:  [id number] CHRISTOPHER END Diagnosing Phys: Nelva Bush MD IMPRESSIONS  1. Left ventricular ejection fraction, by estimation, is 40 to 45%. The left ventricle has mildly decreased function. The left ventricle demonstrates regional wall motion abnormalities (see scoring diagram/findings for description). There is moderate left ventricular hypertrophy. Left ventricular diastolic parameters are consistent with Grade II diastolic dysfunction (pseudonormalization). Elevated left atrial pressure. There is severe hypokinesis of the left ventricular, basal-mid inferoseptal wall and inferior wall.  2. Right ventricular systolic function is mildly reduced. The right ventricular size is normal.  3. Left atrial size was mildly dilated.  4. Right atrial size was mildly dilated.  5. The mitral valve is degenerative. Mild mitral valve regurgitation.  6. The aortic valve is tricuspid. There is moderate calcification of the aortic valve. There is moderate thickening of the aortic valve. Aortic valve regurgitation is mild. 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 While the mean gradient is only mildly  elevated, the abnormal valve area and dimensionless index calculations suggest at least moderate to severe low-flow:low-gradient aortic stenosis. Visual assessment of leaflet movement suggests that aortic stenosis is not critical.  7. The inferior vena cava is normal in size with <50% respiratory variability, suggesting right atrial pressure of 8 mmHg. FINDINGS  Left Ventricle: Left ventricular ejection fraction, by estimation, is 40 to 45%. The left ventricle has mildly decreased function. The left ventricle demonstrates regional wall motion abnormalities. Severe hypokinesis of the left  ventricular, basal-mid inferoseptal wall and inferior wall. The left ventricular internal cavity size was normal in size. There is moderate left ventricular hypertrophy. Left ventricular diastolic parameters are consistent with Grade II diastolic dysfunction (pseudonormalization). Elevated left atrial pressure. Right Ventricle: The right ventricular size is normal. No increase in right ventricular wall thickness. Right ventricular systolic function is mildly reduced. Left Atrium: Left atrial size was mildly dilated. Right Atrium: Right atrial size was mildly dilated. Pericardium: There is no evidence of pericardial effusion. Mitral Valve: The mitral valve is degenerative in appearance. There is mild thickening of the mitral valve leaflet(s). Mild mitral annular calcification. Mild mitral valve regurgitation. Tricuspid Valve: The tricuspid valve is normal in structure. Tricuspid valve regurgitation is trivial. Aortic Valve: The aortic valve is tricuspid. There is moderate calcification of the aortic valve. There is moderate thickening of the aortic valve. Aortic valve regurgitation is mild. Aortic regurgitation PHT measures 913 msec. Moderate to severe aortic stenosis is present. Aortic valve mean gradient measures 14.0 mmHg. Aortic valve peak gradient measures 24.2 mmHg. Aortic valve area, by VTI measures 0.59 cm. Pulmonic Valve: The pulmonic valve was grossly normal. Pulmonic valve regurgitation is trivial. No evidence of pulmonic stenosis. Aorta: The aortic root is normal in size and structure. Venous: The inferior vena cava is normal in size with less than 50% respiratory variability, suggesting right atrial pressure of 8 mmHg. IAS/Shunts: No atrial level shunt detected  by color flow Doppler.  LEFT VENTRICLE PLAX 2D LVIDd:         4.47 cm   Diastology LVIDs:         3.49 cm   LV e' medial:    4.78 cm/s LV PW:         1.40 cm   LV E/e' medial:  20.9 LV IVS:        1.20 cm   LV e' lateral:   6.83 cm/s LVOT diam:      1.90 cm   LV E/e' lateral: 14.6 LV SV:         43 LV SV Index:   21 LVOT Area:     2.84 cm  RIGHT VENTRICLE RV Basal diam:  3.21 cm RV S prime:     9.65 cm/s TAPSE (M-mode): 1.6 cm LEFT ATRIUM             Index        RIGHT ATRIUM           Index LA diam:        4.90 cm 2.41 cm/m   RA Area:     18.40 cm LA Vol (A2C):   60.5 ml 29.70 ml/m  RA Volume:   49.50 ml  24.30 ml/m LA Vol (A4C):   56.4 ml 27.68 ml/m LA Biplane Vol: 59.2 ml 29.06 ml/m  AORTIC VALVE AV Area (Vmax):    0.59 cm AV Area (Vmean):   0.60 cm AV Area (VTI):     0.59 cm AV Vmax:           246.00 cm/s AV Vmean:          174.000 cm/s AV VTI:            0.730 m AV Peak Grad:      24.2 mmHg AV Mean Grad:      14.0 mmHg LVOT Vmax:         50.80 cm/s LVOT Vmean:        37.100 cm/s LVOT VTI:          0.152 m LVOT/AV VTI ratio: 0.21 AI PHT:            913 msec  AORTA Ao Root diam: 3.40 cm MITRAL VALVE MV Area (PHT): 3.70 cm     SHUNTS MV Decel Time: 205 msec     Systemic VTI:  0.15 m MV E velocity: 100.00 cm/s  Systemic Diam: 1.90 cm MV A velocity: 56.90 cm/s MV E/A ratio:  1.76 Cristal Deer End MD Electronically signed by Yvonne Kendall MD Signature Date/Time: 03/12/2022/12:36:49 PM    Final    Chest Portable 1 View  Result Date: 03/12/2022 CLINICAL DATA:  Hip fracture. EXAM: PORTABLE CHEST 1 VIEW COMPARISON:  08/05/2021 FINDINGS: Post median sternotomy. Stable mild cardiomegaly. Unchanged mediastinal contours. Aortic atherosclerosis. Mild vascular congestion. Chronic interstitial coarsening. No focal airspace disease, pleural effusion or pneumothorax. No acute osseous abnormalities are seen. IMPRESSION: Stable mild cardiomegaly.  Mild vascular congestion. Electronically Signed   By: Narda Rutherford M.D.   On: 03/12/2022 01:42   CT HEAD WO CONTRAST ( )  Result Date: 03/12/2022 CLINICAL DATA:  Head trauma. EXAM: CT HEAD WITHOUT CONTRAST TECHNIQUE: Contiguous axial images were obtained from the base of the skull through the vertex without  intravenous contrast. RADIATION DOSE REDUCTION: This exam was performed according to the departmental dose-optimization program which includes automated exposure control, adjustment of the mA and/or kV according to patient size and/or use  of iterative reconstruction technique. COMPARISON:  Head CT dated 06/08/2006. FINDINGS: Brain: Mild age-related atrophy and chronic microvascular ischemic changes. 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 There is no acute intracranial hemorrhage. No mass effect or midline shift. No extra-axial fluid collection. Vascular: No hyperdense vessel or unexpected calcification. Skull: Normal. Negative for fracture or focal lesion. Sinuses/Orbits: No acute finding. Other: None IMPRESSION: 1. No acute intracranial pathology. 2. Mild age-related atrophy and chronic microvascular ischemic changes. Electronically Signed   By: Anner Crete M.D.   On: 03/12/2022 01:25   DG Hip Unilat With Pelvis 2-3 Views Right  Result Date: 03/11/2022 CLINICAL DATA:  [place], right hip injury EXAM: DG HIP (WITH OR WITHOUT PELVIS) 2-3V RIGHT COMPARISON:  None Available. FINDINGS: Frontal view of the pelvis as well as frontal and frogleg lateral views of the right hip are obtained. There is a minimally displaced intertrochanteric right hip fracture with anatomic alignment. Symmetrical bilateral hip osteoarthritis. No subluxation or dislocation. Diffuse atherosclerosis. IMPRESSION: 1. Minimally displaced intertrochanteric right hip fracture, with anatomic alignment. Electronically Signed   By: Randa Ngo M.D.   On: 03/11/2022 22:20    Microbiology: No results found for this or any previous visit (from the past 240 hour(s)).   Labs: Basic Metabolic Panel: Recent Labs  Lab 03/11/22 2322 03/12/22 0418 03/13/22 0357 03/14/22 0523  NA 140 139 137 138  K 5.1 4.6 4.6 4.4  CL 108 111 109 108  CO2 25 23 25 25   GLUCOSE 201* 143* 149* 150*  BUN 40* 34* 33* 32*  CREATININE 1.79*  1.46* 1.54* 1.38*  CALCIUM 8.8* 8.3* 8.4* 8.5*  MG  --   --  2.3  --    Liver Function Tests: No results for input(s): "AST", "ALT", "ALKPHOS", "BILITOT", "PROT", "ALBUMIN" in the last 168 hours. No results for input(s): "LIPASE", "AMYLASE" in the last 168 hours. No results for input(s): "AMMONIA" in the last 168 hours. CBC: Recent Labs  Lab 03/11/22 2322 03/12/22 0418 03/13/22 0357 03/14/22 0523  WBC 12.3* 11.2* 9.8 8.2  NEUTROABS 9.5*  --   --   --   HGB 12.7* 12.4* 11.8* 11.0*  HCT 41.2 40.0 37.2* 33.8*  MCV 103.5* 103.1* 101.1* 99.7  PLT 189 173 148* 128*   Cardiac Enzymes: No results for input(s): "CKTOTAL", "CKMB", "CKMBINDEX", "TROPONINI" in the last 168 hours. BNP: BNP (last 3 results) Recent Labs    07/09/21 2130 08/05/21 0733 03/12/22 0144  BNP 231.9* 514.0* 739.8*    ProBNP (last 3 results) No results for input(s): "PROBNP" in the last 8760 hours.  CBG: Recent Labs  Lab 03/13/22 1205 03/13/22 1748 03/13/22 2138 03/14/22 0730 03/14/22 1156  GLUCAP 224* 203* 179* 154* 150*       Signed:  Desma Maxim MD.  [facility] 03/14/2022, 12:50 PM

## 2022-03-14 NOTE — Plan of Care (Signed)
Problem: Education: Goal: Ability to describe self-care measures that may prevent or decrease complications (Diabetes Survival Skills Education) will improve 03/14/2022 1359 by Orma Render, RN Outcome: Adequate for Discharge 03/14/2022 1359 by Orma Render, RN Outcome: Adequate for Discharge 03/14/2022 [phone] by Orma Render, RN Outcome: Progressing Goal: Individualized Educational Video(s) 03/14/2022 1359 by Orma Render, RN Outcome: Adequate for Discharge 03/14/2022 1359 by Orma Render, RN Outcome: Adequate for Discharge 03/14/2022 [phone] by Orma Render, RN Outcome: Progressing   Problem: Coping: Goal: Ability to adjust to condition or change in health will improve 03/14/2022 1359 by Orma Render, RN Outcome: Adequate for Discharge 03/14/2022 1359 by Orma Render, RN Outcome: Adequate for Discharge 03/14/2022 [phone] by Orma Render, RN Outcome: Progressing   Problem: Fluid Volume: Goal: Ability to maintain a balanced intake and output will improve 03/14/2022 1359 by Orma Render, RN Outcome: Adequate for Discharge 03/14/2022 1359 by Orma Render, RN Outcome: Adequate for Discharge 03/14/2022 [phone] by Orma Render, RN Outcome: Progressing   Problem: Health Behavior/Discharge Planning: Goal: Ability to identify and utilize available resources and services will improve 03/14/2022 1359 by Orma Render, RN Outcome: Adequate for Discharge 03/14/2022 1359 by Orma Render, RN Outcome: Adequate for Discharge 03/14/2022 [phone] by Orma Render, RN Outcome: Progressing Goal: Ability to manage health-related needs will improve 03/14/2022 1359 by Orma Render, RN Outcome: Adequate for Discharge 03/14/2022 1359 by Orma Render, RN Outcome: Adequate for Discharge 03/14/2022 [phone] by Orma Render, RN Outcome: Progressing   Problem: Metabolic: Goal: Ability to maintain appropriate glucose levels will improve 03/14/2022 1359 by Orma Render, RN Outcome: Adequate for  Discharge 03/14/2022 1359 by Orma Render, RN Outcome: Adequate for Discharge 03/14/2022 [phone] by Orma Render, RN Outcome: Progressing   Problem: Nutritional: Goal: Maintenance of adequate nutrition will improve 03/14/2022 1359 by Orma Render, RN Outcome: Adequate for Discharge 03/14/2022 1359 by Orma Render, RN Outcome: Adequate for Discharge 03/14/2022 [phone] by Orma Render, RN Outcome: Progressing Goal: Progress toward achieving an optimal weight will improve 03/14/2022 1359 by Orma Render, RN Outcome: Adequate for Discharge 03/14/2022 1359 by Orma Render, RN Outcome: Adequate for Discharge 03/14/2022 [phone] by Orma Render, RN Outcome: Progressing   Problem: Skin Integrity: Goal: Risk for impaired skin integrity will decrease 03/14/2022 1359 by Orma Render, RN Outcome: Adequate for Discharge 03/14/2022 1359 by Orma Render, RN Outcome: Adequate for Discharge 03/14/2022 [phone] by Orma Render, RN Outcome: Progressing   Problem: Tissue Perfusion: Goal: Adequacy of tissue perfusion will improve 03/14/2022 1359 by Orma Render, RN Outcome: Adequate for Discharge 03/14/2022 1359 by Orma Render, RN Outcome: Adequate for Discharge 03/14/2022 [phone] by Orma Render, RN Outcome: Progressing   Problem: Education: Goal: Knowledge of General Education information will improve Description: Including pain rating scale, medication(s)/side effects and non-pharmacologic comfort measures 03/14/2022 1359 by Orma Render, RN Outcome: Adequate for Discharge 03/14/2022 1359 by Orma Render, RN Outcome: Adequate for Discharge 03/14/2022 [phone] by Orma Render, RN Outcome: Progressing   Problem: Health Behavior/Discharge Planning: Goal: Ability to manage health-related needs will improve 03/14/2022 1359 by Orma Render, RN Outcome: Adequate for Discharge 03/14/2022 1359 by Orma Render, RN Outcome: Adequate for Discharge 03/14/2022 [phone] by Orma Render, RN Outcome: Progressing    Problem: Clinical Measurements: Goal: Ability to maintain clinical measurements within normal limits will improve 03/14/2022 1359  by Orma Render, RN Outcome: Adequate for Discharge 03/14/2022 1359 by Orma Render, RN Outcome: Adequate for Discharge 03/14/2022 [phone] by Orma Render, RN Outcome: Progressing Goal: Will remain free from infection 03/14/2022 1359 by Orma Render, RN Outcome: Adequate for Discharge 03/14/2022 1359 by Orma Render, RN Outcome: Adequate for Discharge 03/14/2022 [phone] by Orma Render, RN Outcome: Progressing Goal: Diagnostic test results will improve 03/14/2022 1359 by Orma Render, RN Outcome: Adequate for Discharge 03/14/2022 1359 by Orma Render, RN Outcome: Adequate for Discharge 03/14/2022 [phone] by Orma Render, RN Outcome: Progressing Goal: Respiratory complications will improve 03/14/2022 1359 by Orma Render, RN Outcome: Adequate for Discharge 03/14/2022 1359 by Orma Render, RN Outcome: Adequate for Discharge 03/14/2022 [phone] by Orma Render, RN Outcome: Progressing Goal: Cardiovascular complication will be avoided 03/14/2022 1359 by Orma Render, RN Outcome: Adequate for Discharge 03/14/2022 1359 by Orma Render, RN Outcome: Adequate for Discharge 03/14/2022 [phone] by Orma Render, RN Outcome: Progressing   Problem: Activity: Goal: Risk for activity intolerance will decrease 03/14/2022 1359 by Orma Render, RN Outcome: Adequate for Discharge 03/14/2022 1359 by Orma Render, RN Outcome: Adequate for Discharge 03/14/2022 [phone] by Orma Render, RN Outcome: Progressing   Problem: Nutrition: Goal: Adequate nutrition will be maintained 03/14/2022 1359 by Orma Render, RN Outcome: Adequate for Discharge 03/14/2022 1359 by Orma Render, RN Outcome: Adequate for Discharge 03/14/2022 [phone] by Orma Render, RN Outcome: Progressing   Problem: Coping: Goal: Level of anxiety will decrease 03/14/2022 1359 by Orma Render, RN Outcome: Adequate  for Discharge 03/14/2022 1359 by Orma Render, RN Outcome: Adequate for Discharge 03/14/2022 [phone] by Orma Render, RN Outcome: Progressing   Problem: Elimination: Goal: Will not experience complications related to bowel motility 03/14/2022 1359 by Orma Render, RN Outcome: Adequate for Discharge 03/14/2022 1359 by Orma Render, RN Outcome: Adequate for Discharge 03/14/2022 [phone] by Orma Render, RN Outcome: Progressing Goal: Will not experience complications related to urinary retention 03/14/2022 1359 by Orma Render, RN Outcome: Adequate for Discharge 03/14/2022 1359 by Orma Render, RN Outcome: Adequate for Discharge 03/14/2022 [phone] by Orma Render, RN Outcome: Progressing   Problem: Pain Managment: Goal: General experience of comfort will improve 03/14/2022 1359 by Orma Render, RN Outcome: Adequate for Discharge 03/14/2022 1359 by Orma Render, RN Outcome: Adequate for Discharge 03/14/2022 [phone] by Orma Render, RN Outcome: Progressing   Problem: Safety: Goal: Ability to remain free from injury will improve 03/14/2022 1359 by Orma Render, RN Outcome: Adequate for Discharge 03/14/2022 1359 by Orma Render, RN Outcome: Adequate for Discharge 03/14/2022 [phone] by Orma Render, RN Outcome: Progressing   Problem: Skin Integrity: Goal: Risk for impaired skin integrity will decrease 03/14/2022 1359 by Orma Render, RN Outcome: Adequate for Discharge 03/14/2022 1359 by Orma Render, RN Outcome: Adequate for Discharge 03/14/2022 [phone] by Orma Render, RN Outcome: Progressing

## 2022-03-14 NOTE — Progress Notes (Signed)
Occupational Therapy Treatment Patient Details Name: Bradley [name] MRN: [id number] DOB: August 04, 1941 Today's Date: 03/14/2022   History of present illness Caldwell Kronenberger is a 81 y.o. 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 Per patient he was out tending to his plants when he tripped on wet uneven ground and fell landing on his right hip. Patient states prior to the fall he was in his normal state of health. He denies any sob/chest/pain /n/v/dysuria or diarrhea / fever or chills. S/P R femur ORIF.   OT comments  Pt seen for OT treatment on this date. Upon arrival to room pt awake and alert, lying in bed with HOB elevated and family present. Pt reporting 10/10 pain with movement. Pt agreeable to tx focused on LBD and AE training. Pt requiring MOD I for bed mobility and SUPERVISION with min vcs for safe hand placement for STS.   SUPERVISION with min vcs for technique and sequencing for LBD with reacher and sock aide while seated - no observed LOB while standing to pull pants into place. CGA for grooming tasks at the sink in standing ~3 minutes - limited by pain in R LE. Pt educated and provided demonstration on AE use for LBD. Pt verbalized and demonstrated understanding. Pt left at EOB with PT present. Pt making good progress toward goals. Pt continues to benefit from skilled OT services to maximize return to PLOF and minimize risk of future falls, injury. Will continue to follow POC. Discharge recommendation remains appropriate.     Recommendations for follow up therapy are one component of a multi-disciplinary discharge planning process, led by the attending physician.  Recommendations may be updated based on patient status, additional functional criteria and insurance authorization.    Follow Up Recommendations  Home health OT    Assistance  Recommended at Discharge Intermittent Supervision/Assistance  Patient can return home with the following  A little help with walking and/or transfers;A little help with bathing/dressing/bathroom;Assistance with cooking/housework;Assist for transportation;Help with stairs or ramp for entrance   Equipment Recommendations  None recommended by OT    Recommendations for Other Services      Precautions / Restrictions Precautions Precautions: Fall Restrictions Weight Bearing Restrictions: Yes RLE Weight Bearing: Weight bearing as tolerated       Mobility Bed Mobility Overal bed mobility: Modified Independent                  Transfers Overall transfer level: Needs assistance Equipment used: Rolling walker (2 wheels) Transfers: Sit to/from Stand Sit to Stand: Supervision           General transfer comment: with vcs for safe hand placement     Balance Overall balance assessment: Modified Independent Sitting-balance support: Feet supported, No upper extremity supported Sitting balance-Leahy Scale: Good     Standing balance support: No upper extremity supported, During functional activity Standing balance-Leahy Scale: Good Standing balance comment: no UE support to perform grooming tasks at sink                           ADL either performed or assessed with clinical judgement   ADL Overall ADL's : Needs assistance/impaired  General ADL Comments: SUPERVISION with min vcs for technique and sequencing for LBD with reacher and sock aide while seated - no observed LOB while standing to pull pants into place. CGA for grooming tasks at the sink in standing ~3 minutes - limited by 10/10 pain in R LE.      Cognition Arousal/Alertness: Awake/alert Behavior During Therapy: WFL for tasks assessed/performed Overall Cognitive Status: Within Functional Limits for tasks assessed                                                      Pertinent Vitals/ Pain       Pain Assessment Pain Assessment: 0-10 Pain Score: 10-Worst pain ever Pain Location: R hip Pain Descriptors / Indicators: Discomfort, Grimacing, Guarding, Sore Pain Intervention(s): Limited activity within patient's tolerance, Monitored during session   Frequency  Min 2X/week        Progress Toward Goals  OT Goals(current goals can now be found in the care plan section)  Progress towards OT goals: Progressing toward goals  Acute Rehab OT Goals Patient Stated Goal: to return to PLOF OT Goal Formulation: With patient Time For Goal Achievement: 03/27/22 Potential to Achieve Goals: Good ADL Goals Pt Will Perform Grooming: with modified independence;standing Pt Will Perform Lower Body Dressing: with modified independence;with adaptive equipment;sit to/from stand Pt Will Transfer to Toilet: with modified independence;ambulating;regular height toilet  Plan Discharge plan remains appropriate;Frequency remains appropriate       AM-PAC OT "6 Clicks" Daily Activity     Outcome Measure   Help from another person eating meals?: None Help from another person taking care of personal grooming?: A Little Help from another person toileting, which includes using toliet, bedpan, or urinal?: A Little Help from another person bathing (including washing, rinsing, drying)?: A Lot Help from another person to put on and taking off regular upper body clothing?: None Help from another person to put on and taking off regular lower body clothing?: A Little 6 Click Score: 19    End of Session Equipment Utilized During Treatment: Rolling walker (2 wheels)  OT Visit Diagnosis: Muscle weakness (generalized) (M62.81)   Activity Tolerance Patient tolerated treatment well;Patient limited by pain   Patient Left in bed;Other (comment) (Pt left at EOB with PT present)   Nurse Communication          Time: 2542-7062 OT Time  Calculation (min): 18 min  Charges: OT General Charges $OT Visit: 1 Visit OT Treatments $Self Care/Home Management : 8-22 mins  [employer], OTDS  [employer] 03/14/2022, 1:31 PM

## 2022-03-14 NOTE — Progress Notes (Signed)
Physical Therapy Treatment Patient Details Name: Bradley [name] MRN: [id number] DOB: [birth year] Today's Date: 03/14/2022   History of Present Illness Bradley [name] is a 81 y.o. 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 Per patient he was out tending to his plants when he tripped on wet uneven ground and fell landing on his right hip. Patient states prior to the fall he was in his normal state of health. He denies any sob/chest/pain /n/v/dysuria or diarrhea / fever or chills. S/P R femur ORIF.    PT Comments    Patient received sitting on side of bed. He is waiting for walker to be delivered to go home. He requires min guard for sit to stand with increased effort due to pain. He is able to ambulate 75 feet with RW and min guard. Patient will continue to benefit from skilled PT at home, should be discharged home.        Recommendations for follow up therapy are one component of a multi-disciplinary discharge planning process, led by the attending physician.  Recommendations may be updated based on patient status, additional functional criteria and insurance authorization.  Follow Up Recommendations  Home health PT     Assistance Recommended at Discharge Intermittent Supervision/Assistance  Patient can return home with the following A little help with walking and/or transfers;A little help with bathing/dressing/bathroom;Assist for transportation;Help with stairs or ramp for entrance;Assistance with cooking/housework   Equipment Recommendations  Rolling walker (2 wheels)    Recommendations for Other Services       Precautions / Restrictions Precautions Precautions: Fall Restrictions Weight Bearing Restrictions: Yes RLE Weight Bearing: Weight bearing as tolerated     Mobility  Bed Mobility               General bed mobility  comments: patient received sitting up on side of bed    Transfers Overall transfer level: Needs assistance Equipment used: Rolling walker (2 wheels) Transfers: Sit to/from Stand Sit to Stand: Min guard           General transfer comment: with vcs for safe hand placement    Ambulation/Gait Ambulation/Gait assistance: Min guard Gait Distance (Feet): 70 Feet Assistive device: Rolling walker (2 wheels) Gait Pattern/deviations: Step-to pattern, Decreased step length - right, Decreased weight shift to right Gait velocity: slightly slowed     General Gait Details: cues for proximity to [employer] Stairs: Yes Stairs assistance: Min guard Stair Management: One rail Left, Step to pattern Number of Stairs: 4 General stair comments: increased difficulty descending steps, requiring closer supervision   Wheelchair Mobility    Modified Rankin (Stroke Patients Only)       Balance Overall balance assessment: Modified Independent Sitting-balance support: Feet supported Sitting balance-Leahy Scale: Good     Standing balance support: Bilateral upper extremity supported, During functional activity, Reliant on assistive device for balance Standing balance-Leahy Scale: Good                              Cognition Arousal/Alertness: Awake/alert Behavior During Therapy: WFL for tasks assessed/performed Overall Cognitive Status: Within Functional Limits for tasks assessed  Exercises Total Joint Exercises Ankle Circles/Pumps: AROM, 10 reps Quad Sets: AROM, 5 reps Gluteal Sets: AROM, 5 reps Towel Squeeze: AROM, 5 reps Short Arc Quad: AROM, 5 reps, Right Heel Slides: Right, 10 reps, AAROM Hip ABduction/ADduction: AAROM, Right, 10 reps Straight Leg Raises: AAROM, Right, 10 reps    General Comments        Pertinent Vitals/Pain Pain Assessment Pain Assessment: Faces Faces Pain Scale: Hurts even  more Pain Location: R hip Pain Descriptors / Indicators: Discomfort, Grimacing, Guarding, Sore Pain Intervention(s): Monitored during session    Home Living                          Prior Function            PT Goals (current goals can now be found in the care plan section) Acute Rehab PT Goals Patient Stated Goal: to get back to full independence PT Goal Formulation: With patient Time For Goal Achievement: 03/20/22 Potential to Achieve Goals: Good Progress towards PT goals: Progressing toward goals    Frequency    BID      PT Plan Current plan remains appropriate    Co-evaluation              AM-PAC PT "6 Clicks" Mobility   Outcome Measure  Help needed turning from your back to your side while in a flat bed without using bedrails?: A Little Help needed moving from lying on your back to sitting on the side of a flat bed without using bedrails?: A Little Help needed moving to and from a bed to a chair (including a wheelchair)?: A Little Help needed standing up from a chair using your arms (e.g., wheelchair or bedside chair)?: A Little Help needed to walk in hospital room?: A Little Help needed climbing 3-5 steps with a railing? : A Little 6 Click Score: 18    End of Session Equipment Utilized During Treatment: Gait belt Activity Tolerance: Patient tolerated treatment well Patient left: in bed;with call bell/phone within reach;with family/visitor present Nurse Communication: Mobility status PT Visit Diagnosis: Other abnormalities of gait and mobility (R26.89);Pain;History of falling (Z91.81);Muscle weakness (generalized) (M62.81);Difficulty in walking, not elsewhere classified (R26.2) Pain - Right/Left: Right Pain - part of body: Hip     Time: 1355-1403 (+5 min for ambulation) PT Time Calculation (min) (ACUTE ONLY): 8 min  Charges:  $Gait Training: 23-37 mins $Therapeutic Exercise: 8-22 mins                     Sanjith Siwek, PT,  [facility] 03/14/22,3:51 PM

## 2022-03-14 NOTE — Plan of Care (Signed)

## 2022-03-29 ENCOUNTER — Ambulatory Visit: Payer: No Typology Code available for payment source | Admitting: Physician Assistant

## 2022-06-09 NOTE — Progress Notes (Unsigned)
Cardiology Office Note  Date:  06/10/2022   ID:  Bradley [name], DOB 11/10/1940, MRN [id number]  PCP:  System, Provider Not In   Chief Complaint  Patient presents with   6 month follow up     HPI:  [name] is a 81 yo M with PMHx of  CAD s/p CABG X 3V 2016,  HFrEF (30-35%),  moderate AS,  Afib (on AC),  CKD stage 3 (baseline Cr ~1.4),  DM2 with diabetic retinopathy,  HTN,  HLD,  gout  [facility] January 2023 with worsening chest pain.  Unsuccessful PCI attempt of distal SVG to PL lesion.  Who presents for f/u of his  CAD, Afib, moderate aortic valve stenosis  LOV 4/23 In hospital 7/23 with hip fx mechanical fall onto right hip S/p reduction and internal fixation 7/18 with ortho, Dr. Roland Rack Maintaining sinus rhythm on amiodarone  Echo 7/23 reviewed Challenging images, Definity not used  1. Left ventricular ejection fraction, by estimation, is 40 to 45%. The  left ventricle has mildly decreased function. The left ventricle  demonstrates regional wall motion abnormalities (see scoring diagram/findings for description). There is moderate  left ventricular hypertrophy. Left ventricular diastolic parameters are  consistent with Grade II diastolic dysfunction (pseudonormalization).  Elevated left atrial pressure. There is severe hypokinesis of the left  ventricular, basal-mid inferoseptal wall  and inferior wall.   2. Right ventricular systolic function is mildly reduced. The right  ventricular size is normal.   6. The aortic valve moderate calcification of the  aortic valve.  Mean gradient 14 mmHg peak gradient 24  Aortic valve regurgitation is mild. aortic valve stenosis.  Visual assessment of leaflet movement suggests that aortic stenosis is not  critical.   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 Sedentary, no regular exercise program No SOb or angina, sometimes uses his nitro for GERD symptoms No significant leg edema  On discussion of his  medications he is taking Entresto and taking a separate dose of valsartan 40 mg Blood pressure running low 123XX123 systolic, denies orthostasis symptoms but he does confirm it has been running low  Lab work reviewed  creatinine 1.38 BUN 32 A1c 7.8 down from 8.6 BNP 739 Total cholesterol 81  EKG personally reviewed by myself on todays visit NSR rate 57 nonspecific nonspecific ST ABN  Other past medical history reviewed  Hospitalized at [facility] 08/05/21 for CP and DOE and was found to be in afib and started on apixaban. He had a LHC on 06/2021 at [facility] that showed RPDA distal to SVG anastomosis occlusion and was medically managed.   08/2021 chest pain and SOB prompted him to present to the ED at [facility] Unsuccessful PCI attempt of distal SVG to PL lesion.   increased swelling in both of his legs,   ECG showed afib, rate controlled. No evidence of ischemia or infarct.   CXR showed with pulmonary edema and small bilateral pleural effusion. Patient was admitted to cardiology.   Cardioversion for atrial fibrillation at the [facility]    PMH:   has a past medical history of (HFpEF) heart failure with preserved ejection fraction ([place]), Coronary artery disease, Diabetes mellitus without complication ([place]), Hyperlipidemia, and Hypertension.  PSH:    Past Surgical History:  Procedure Laterality Date   CATARACT EXTRACTION     CHOLECYSTECTOMY     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   [facility]: LIMA-LAD, SVG-OM1, and SVG-rPDA   INTRAMEDULLARY (IM) NAIL INTERTROCHANTERIC Right 03/12/2022  Procedure: INTRAMEDULLARY (IM) NAIL INTERTROCHANTRIC;  Surgeon: Christena FlakePoggi, John J, MD;  Location: [facility] ORS;  Service: Orthopedics;  Laterality: Right;   LEFT HEART CATH AND CORS/GRAFTS ANGIOGRAPHY N/A 07/10/2021   Procedure: LEFT HEART CATH AND CORS/GRAFTS ANGIOGRAPHY;  Surgeon: Yvonne KendallEnd, Christopher, MD;  Location: [facility] INVASIVE CV LAB;  Service: Cardiovascular;  Laterality: N/A;    Current Outpatient Medications   Medication Sig Dispense Refill   acetaminophen (TYLENOL) 325 MG tablet Take 650-1,300 mg by mouth 2 (two) times daily as needed.     amiodarone (PACERONE) 200 MG tablet Take 200 mg by mouth daily.     apixaban (ELIQUIS) 5 MG TABS tablet Take 1 tablet (5 mg total) by mouth 2 (two) times daily. 60 tablet 0   atorvastatin (LIPITOR) 80 MG tablet TAKE ONE TABLET BY MOUTH AT BEDTIME FOR CHOLESTEROL     Brinzolamide-Brimonidine 1-0.2 % SUSP Place 1 drop into the left eye 3 (three) times daily.     carboxymethylcellulose 1 % ophthalmic solution Apply 1 drop to eye at bedtime.     clopidogrel (PLAVIX) 75 MG tablet Take 1 tablet (75 mg total) by mouth daily with breakfast. 30 tablet 2   empagliflozin (JARDIANCE) 25 MG TABS tablet Take 12.5 mg by mouth every morning.     furosemide (LASIX) 40 MG tablet Take 1 tablet (40 mg total) by mouth daily. 30 tablet 2   glipiZIDE (GLUCOTROL) 5 MG tablet Take by mouth.     HYDROcodone-acetaminophen (NORCO/VICODIN) 5-325 MG tablet Take 1 tablet by mouth every 6 (six) hours as needed for moderate pain. 20 tablet 0   isosorbide mononitrate (IMDUR) 60 MG 24 hr tablet Take 1 tablet (60 mg total) by mouth daily. 30 tablet 0   metoprolol succinate (TOPROL-XL) 50 MG 24 hr tablet Take by mouth.     nitroGLYCERIN (NITROSTAT) 0.4 MG SL tablet Place 1 tablet (0.4 mg total) under the tongue every 5 (five) minutes x 3 doses as needed for chest pain. 30 tablet 2   pantoprazole (PROTONIX) 40 MG tablet Take 40 mg by mouth daily.     polyethylene glycol (MIRALAX / GLYCOLAX) 17 g packet Take 17 g by mouth daily. 14 each 0   Propylene Glycol 0.6 % SOLN Place 1 drop into both eyes 4 (four) times daily.     sacubitril-valsartan (ENTRESTO) 24-26 MG Take 1 tablet by mouth every 12 (twelve) hours.     spironolactone (ALDACTONE) 25 MG tablet Take 1 tablet by mouth daily.     timolol (TIMOPTIC-XR) 0.5 % ophthalmic gel-forming Place 1 drop into both eyes daily.     travoprost, benzalkonium,  (TRAVATAN) 0.004 % ophthalmic solution Place 1 drop into both eyes nightly.     vitamin B-12 (CYANOCOBALAMIN) 500 MCG tablet Take 1,000 mcg by mouth daily. Before breakfast     No current facility-administered medications for this visit.     Allergies:   Simvastatin   Social History:  The patient  reports that he has quit smoking. He has never used smokeless tobacco. He reports that he does not drink alcohol and does not use drugs.   Family History:   family history includes CAD in his mother; Cancer in his brother and sister; Hip fracture in his father; Urinary tract infection in his father.    Review of Systems: Review of Systems  Constitutional: Negative.   HENT: Negative.    Respiratory: Negative.    Cardiovascular: Negative.   Gastrointestinal: Negative.   Musculoskeletal: Negative.   Neurological: Negative.  Psychiatric/Behavioral: Negative.    All other systems reviewed and are negative.   PHYSICAL EXAM: VS:  BP (!) 100/44 (BP Location: Left Arm, Patient Position: Sitting, Cuff Size: Normal)   Pulse (!) 57   Ht 6' (1.829 m)   Wt 179 lb 4 oz (81.3 kg)   SpO2 98%   BMI 24.31 kg/m  , BMI Body mass index is 24.31 kg/m. GEN: Well nourished, well developed, in no acute distress HEENT: normal Neck: no JVD, carotid bruits, or masses Cardiac: RRR; no murmurs, rubs, or gallops,no edema  Respiratory:  clear to auscultation bilaterally, normal work of breathing GI: soft, nontender, nondistended, + BS MS: no deformity or atrophy Skin: warm and dry, no rash Neuro:  Strength and sensation are intact Psych: euthymic mood, full affect  Recent Labs: 07/09/2021: ALT 17 08/05/2021: TSH 1.042 03/12/2022: B Natriuretic Peptide 739.8 03/13/2022: Magnesium 2.3 03/14/2022: BUN 32; Creatinine, Ser 1.38; Hemoglobin 11.0; Platelets 128; Potassium 4.4; Sodium 138    Lipid Panel Lab Results  Component Value Date   CHOL 81 08/06/2021   HDL 33 (L) 08/06/2021   LDLCALC 31 08/06/2021    TRIG 87 08/06/2021      Wt Readings from Last 3 Encounters:  06/10/22 179 lb 4 oz (81.3 kg)  03/11/22 180 lb (81.6 kg)  12/10/21 184 lb 6 oz (83.6 kg)     ASSESSMENT AND PLAN:  Problem List Items Addressed This Visit       Cardiology Problems   Essential hypertension   Relevant Medications   atorvastatin (LIPITOR) 80 MG tablet   Other Relevant Orders   EKG 12-Lead   AF (paroxysmal atrial fibrillation) (HCC)   Relevant Medications   atorvastatin (LIPITOR) 80 MG tablet   Hyperlipidemia   Relevant Medications   atorvastatin (LIPITOR) 80 MG tablet   CAD (coronary artery disease) - Primary   Relevant Medications   atorvastatin (LIPITOR) 80 MG tablet   Other Relevant Orders   EKG 12-Lead   Ischemic cardiomyopathy   Relevant Medications   atorvastatin (LIPITOR) 80 MG tablet   Other Relevant Orders   EKG 12-Lead   Acute on chronic diastolic CHF (congestive heart failure) (HCC)   Relevant Medications   atorvastatin (LIPITOR) 80 MG tablet     Other   S/P CABG (coronary artery bypass graft)   Relevant Orders   EKG 12-Lead   CKD stage 3 due to type 2 diabetes mellitus (HCC)   Relevant Medications   atorvastatin (LIPITOR) 80 MG tablet   Other Visit Diagnoses     Nonrheumatic aortic valve stenosis       Relevant Medications   atorvastatin (LIPITOR) 80 MG tablet     # Acute on chronic systolic and diastolic heart failure Maintaining normal sinus rhythm, on Lasix 40 daily Ejection fraction 30 to 35% on echo November 2022 Ejection fraction 40 to 45% in July 2023 no images were challenging, needed Definity  # Moderate aortic stenosis Repeat echocardiogram ordered for July 2024  # Iron deficiency anemia  Follows at the [place]  # R SFA occlusion RLE ultrasound for R>L lower extremity edema which showed R SFA occlusion.  No claudication symptoms Cholesterol at goal   # CAD s/p CABG Recent catheterization reviewed from [facility] Atypical symptoms concerning for  GERD, periodically takes nitro  # HTN Recommend he stop the valsartan 40 , stay on Entresto, other medications stay the same Blood pressure low 123XX123 systolic  # HLD Lipitor, goal LDL less than 70   #  Atrial fibrillation, persistent diagnosed Nov. 2022.  on eliquis 5mg  BID and BB.  CHADS2 VASC score of 6.  Continue metoprolol, maintaining normal sinus rhythm  # CKD Stable renal function   # DMII On glipizide   Total encounter time more than 30 minutes  Greater than 50% was spent in counseling and coordination of care with the patient    Signed, Esmond Plants, M.D., Ph.D. [place], [place]

## 2022-06-10 ENCOUNTER — Ambulatory Visit
Payer: No Typology Code available for payment source | Attending: Cardiovascular Disease | Admitting: Cardiovascular Disease

## 2022-06-10 ENCOUNTER — Encounter: Payer: Self-pay | Admitting: Cardiovascular Disease

## 2022-06-10 VITALS — BP 100/44 | HR 57 | Ht 72.0 in | Wt 179.2 lb

## 2022-06-10 DIAGNOSIS — E1122 Type 2 diabetes mellitus with diabetic chronic kidney disease: Secondary | ICD-10-CM

## 2022-06-10 DIAGNOSIS — I350 Nonrheumatic aortic (valve) stenosis: Secondary | ICD-10-CM

## 2022-06-10 DIAGNOSIS — N183 Chronic kidney disease, stage 3 unspecified: Secondary | ICD-10-CM

## 2022-06-10 DIAGNOSIS — I10 Essential (primary) hypertension: Secondary | ICD-10-CM

## 2022-06-10 DIAGNOSIS — I5033 Acute on chronic diastolic (congestive) heart failure: Secondary | ICD-10-CM

## 2022-06-10 DIAGNOSIS — I480 Paroxysmal atrial fibrillation: Secondary | ICD-10-CM

## 2022-06-10 DIAGNOSIS — E782 Mixed hyperlipidemia: Secondary | ICD-10-CM

## 2022-06-10 DIAGNOSIS — Z951 Presence of aortocoronary bypass graft: Secondary | ICD-10-CM

## 2022-06-10 DIAGNOSIS — I255 Ischemic cardiomyopathy: Secondary | ICD-10-CM

## 2022-06-10 DIAGNOSIS — I25118 Atherosclerotic heart disease of native coronary artery with other forms of angina pectoris: Secondary | ICD-10-CM

## 2022-06-10 NOTE — Patient Instructions (Addendum)
Medication Instructions:  - Your physician has recommended you make the following change in your medication:   1) STOP the valsartan 40 mg pill  2) Try omeprazole daily for heart burn - Or take pepcid with tums as needed  If you need a refill on your cardiac medications before your next appointment, please call your pharmacy.    Lab work: No new labs needed   Testing/Procedures: 1) Echocardiogram 02/2023 for aortic valve stenosis (we will call you closer to time to schedule this) - Your physician has requested that you have an echocardiogram. Echocardiography is a painless test that uses sound waves to create images of your heart. It provides your doctor with information about the size and shape of your heart and how well your heart's chambers and valves are working. This procedure takes approximately one hour. There are no restrictions for this procedure.There is a possibility that an IV may need to be started during your test to inject an image enhancing agent. This is done to obtain more optimal pictures of your heart. Therefore we ask that you do at least drink some water prior to coming in to hydrate your veins.   Please do NOT wear cologne, perfume, aftershave, or lotions (deodorant is allowed). Please arrive 15 minutes prior to your appointment time.   Follow-Up: At [facility], you and your health needs are our priority.  As part of our continuing mission to provide you with exceptional heart care, we have created designated Provider Care Teams.  These Care Teams include your primary Cardiologist (physician) and Advanced Practice Providers (APPs -  Physician Assistants and Nurse Practitioners) who all work together to provide you with the care you need, when you need it.  You will need a follow up appointment in 12 months  Providers on your designated Care Team:   Bradley Hodgkins, NP Bradley Faith, PA-C Bradley [name], [place]  COVID-19 Vaccine Information can be found at:  [email or web address] For questions related to vaccine distribution or appointments, please email [email or web address] or call [phone].

## 2022-07-10 IMAGING — DX DG CHEST 1V PORT
1 series · 1 of 1 positions shown · non-contrast
Comparison: 03/13/2018

CLINICAL DATA: Shortness of breath and chest pain.

EXAM:
PORTABLE CHEST 1 VIEW

[chest ap]
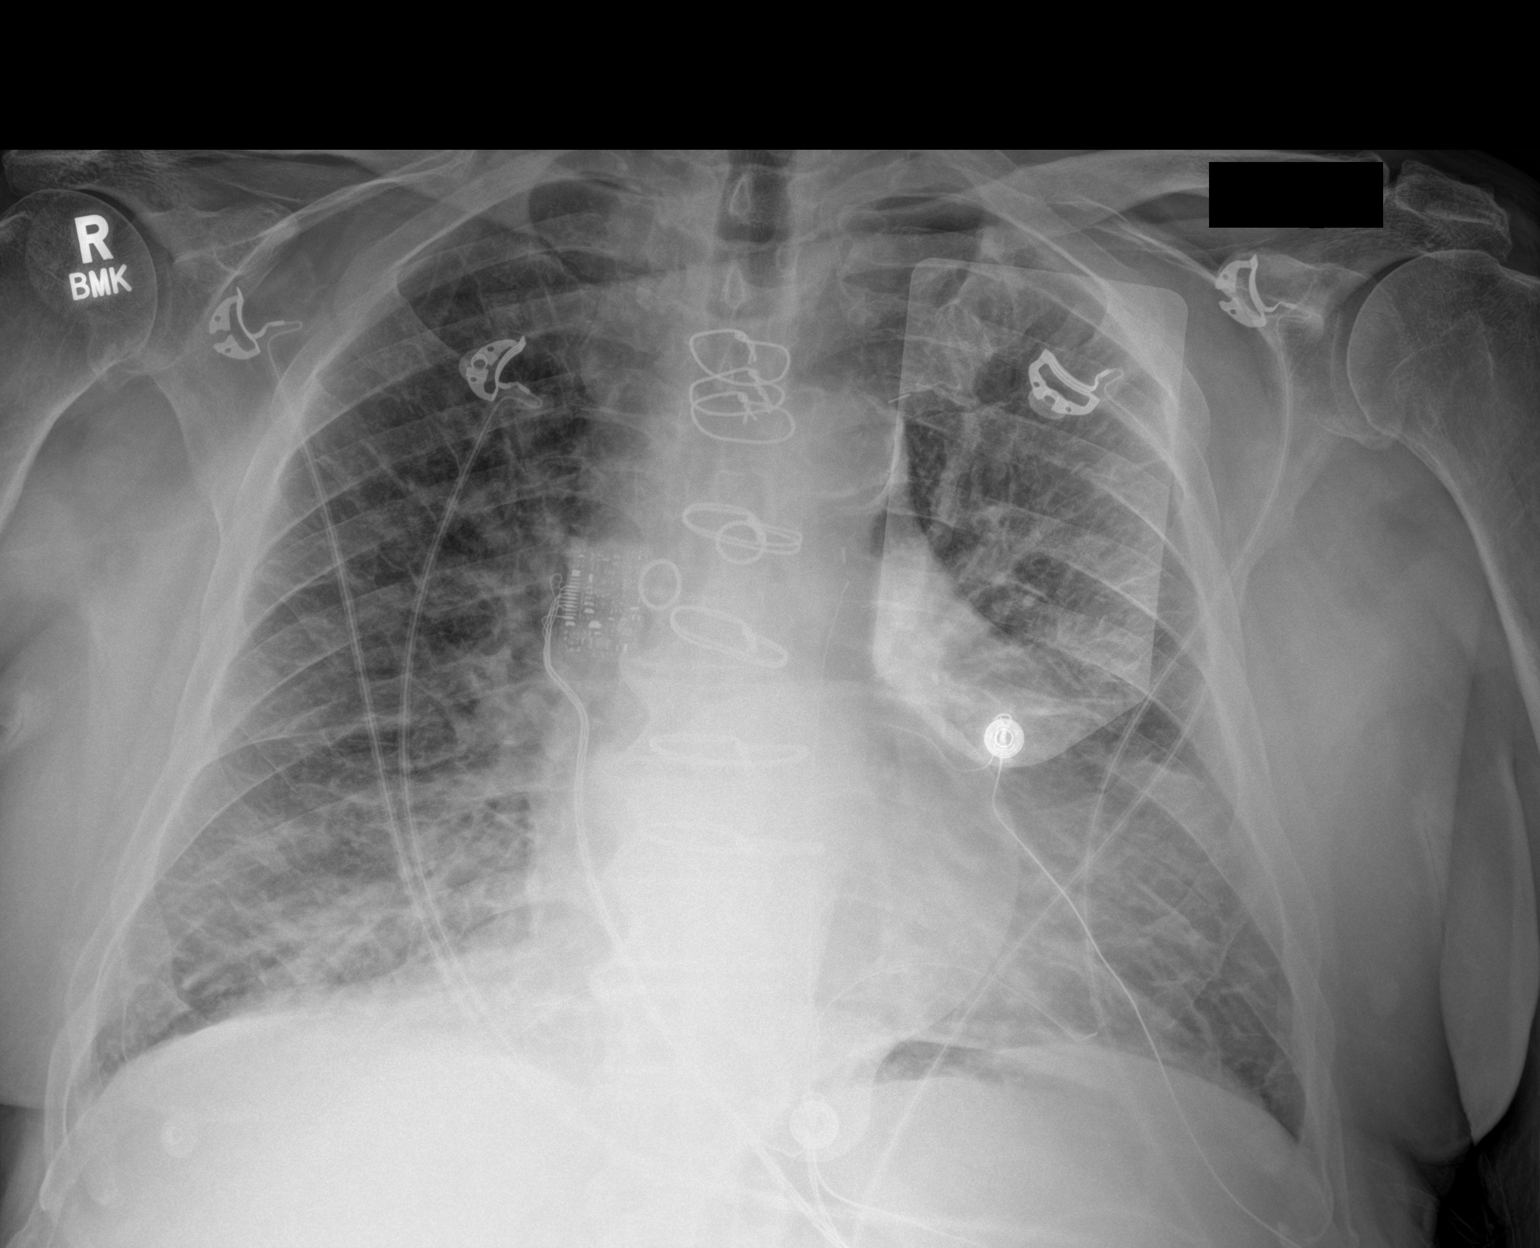

[1 of 1 positions shown; findings below may reference images not displayed]

FINDINGS: Post median sternotomy. Mild cardiomegaly. Mild interstitial and
septal thickening typical of pulmonary edema. There may be trace
pleural effusions. No pneumothorax. Patchy bibasilar opacities favor
atelectasis.
IMPRESSION: Cardiomegaly with mild pulmonary edema. Possible trace pleural
effusions. Findings most consistent with CHF.

## 2022-08-06 IMAGING — CR DG CHEST 2V
2 series · 2 of 2 positions shown · non-contrast
Comparison: July 09, 2021.

CLINICAL DATA: Chest pain, shortness of breath.

EXAM:
CHEST - 2 VIEW

[chest pa]
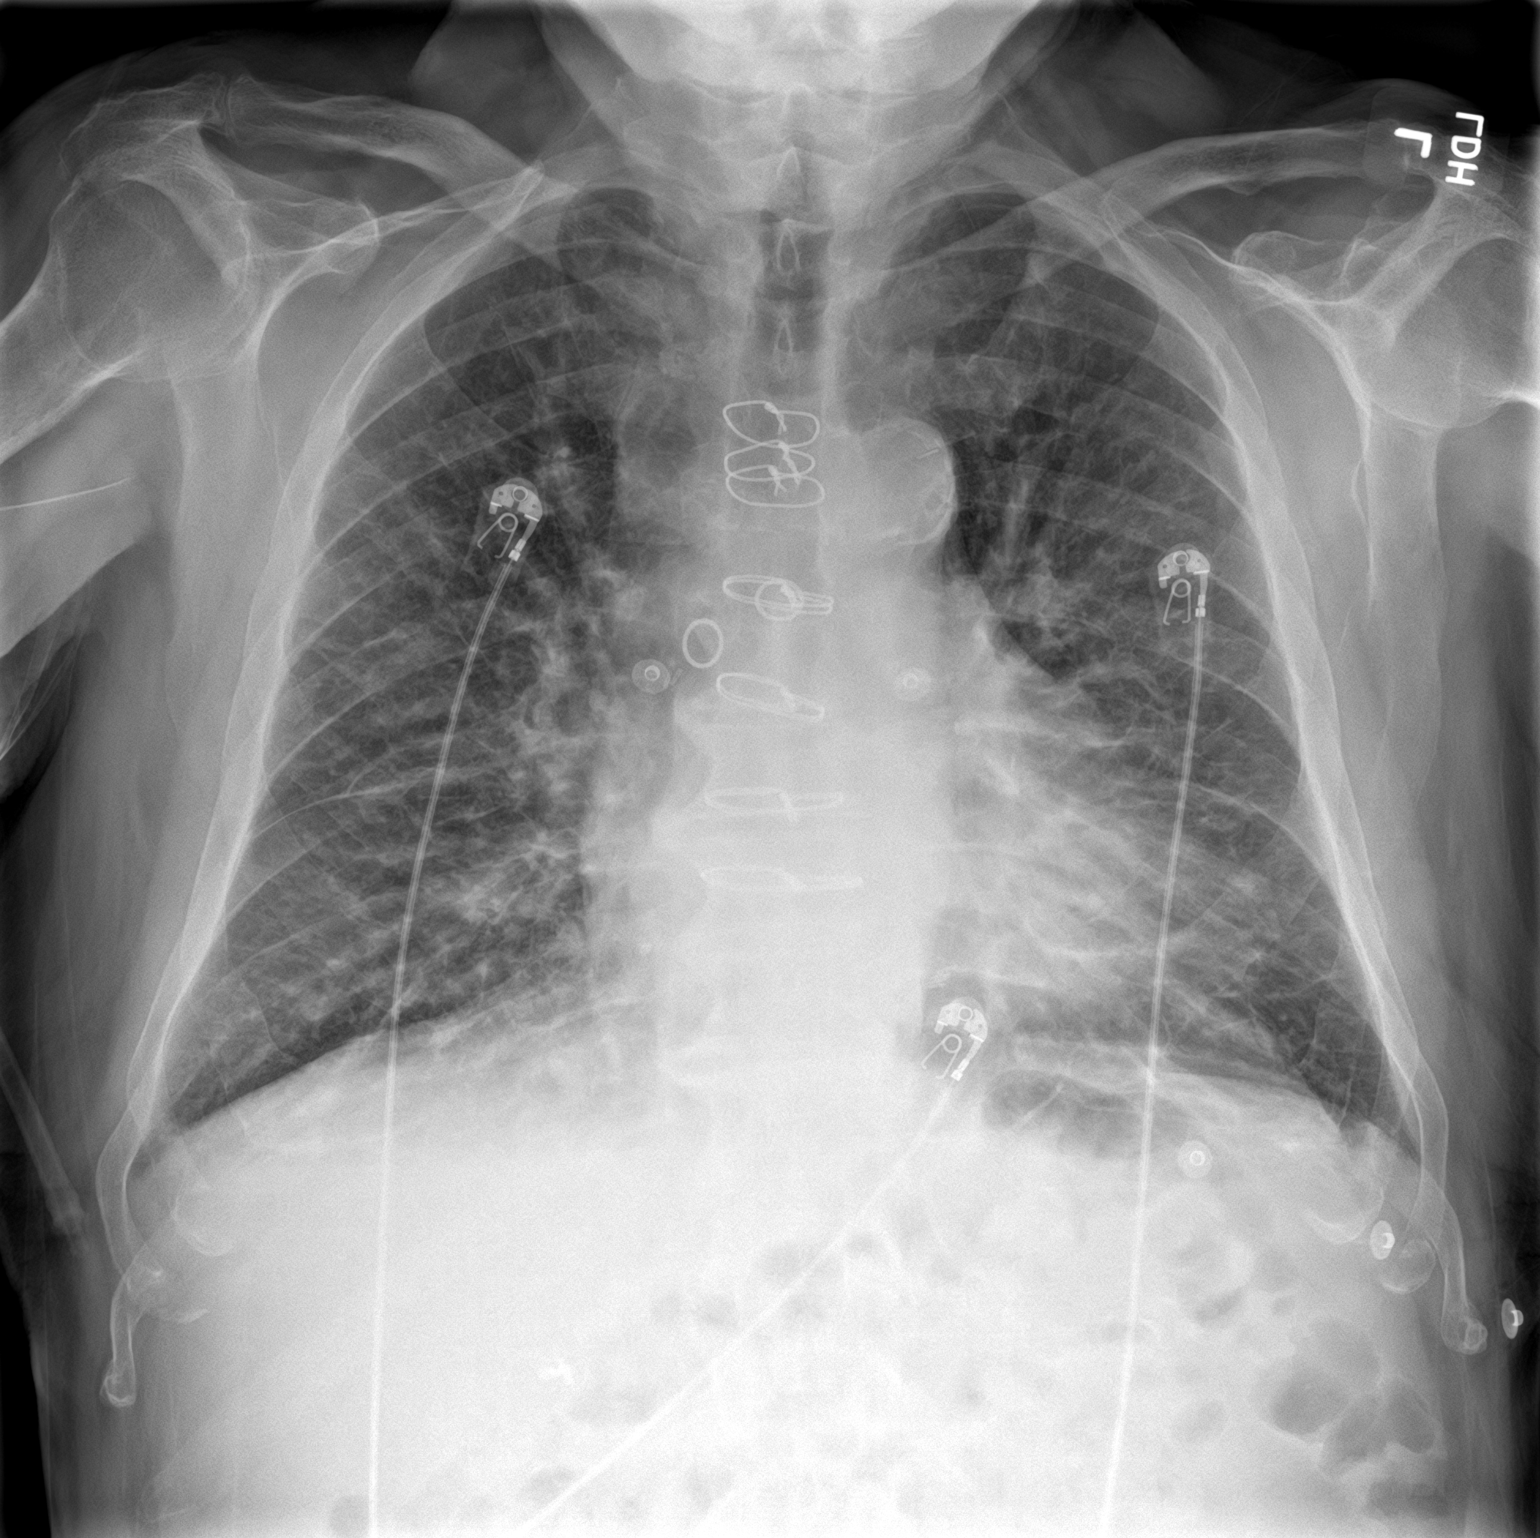

[chest lat]
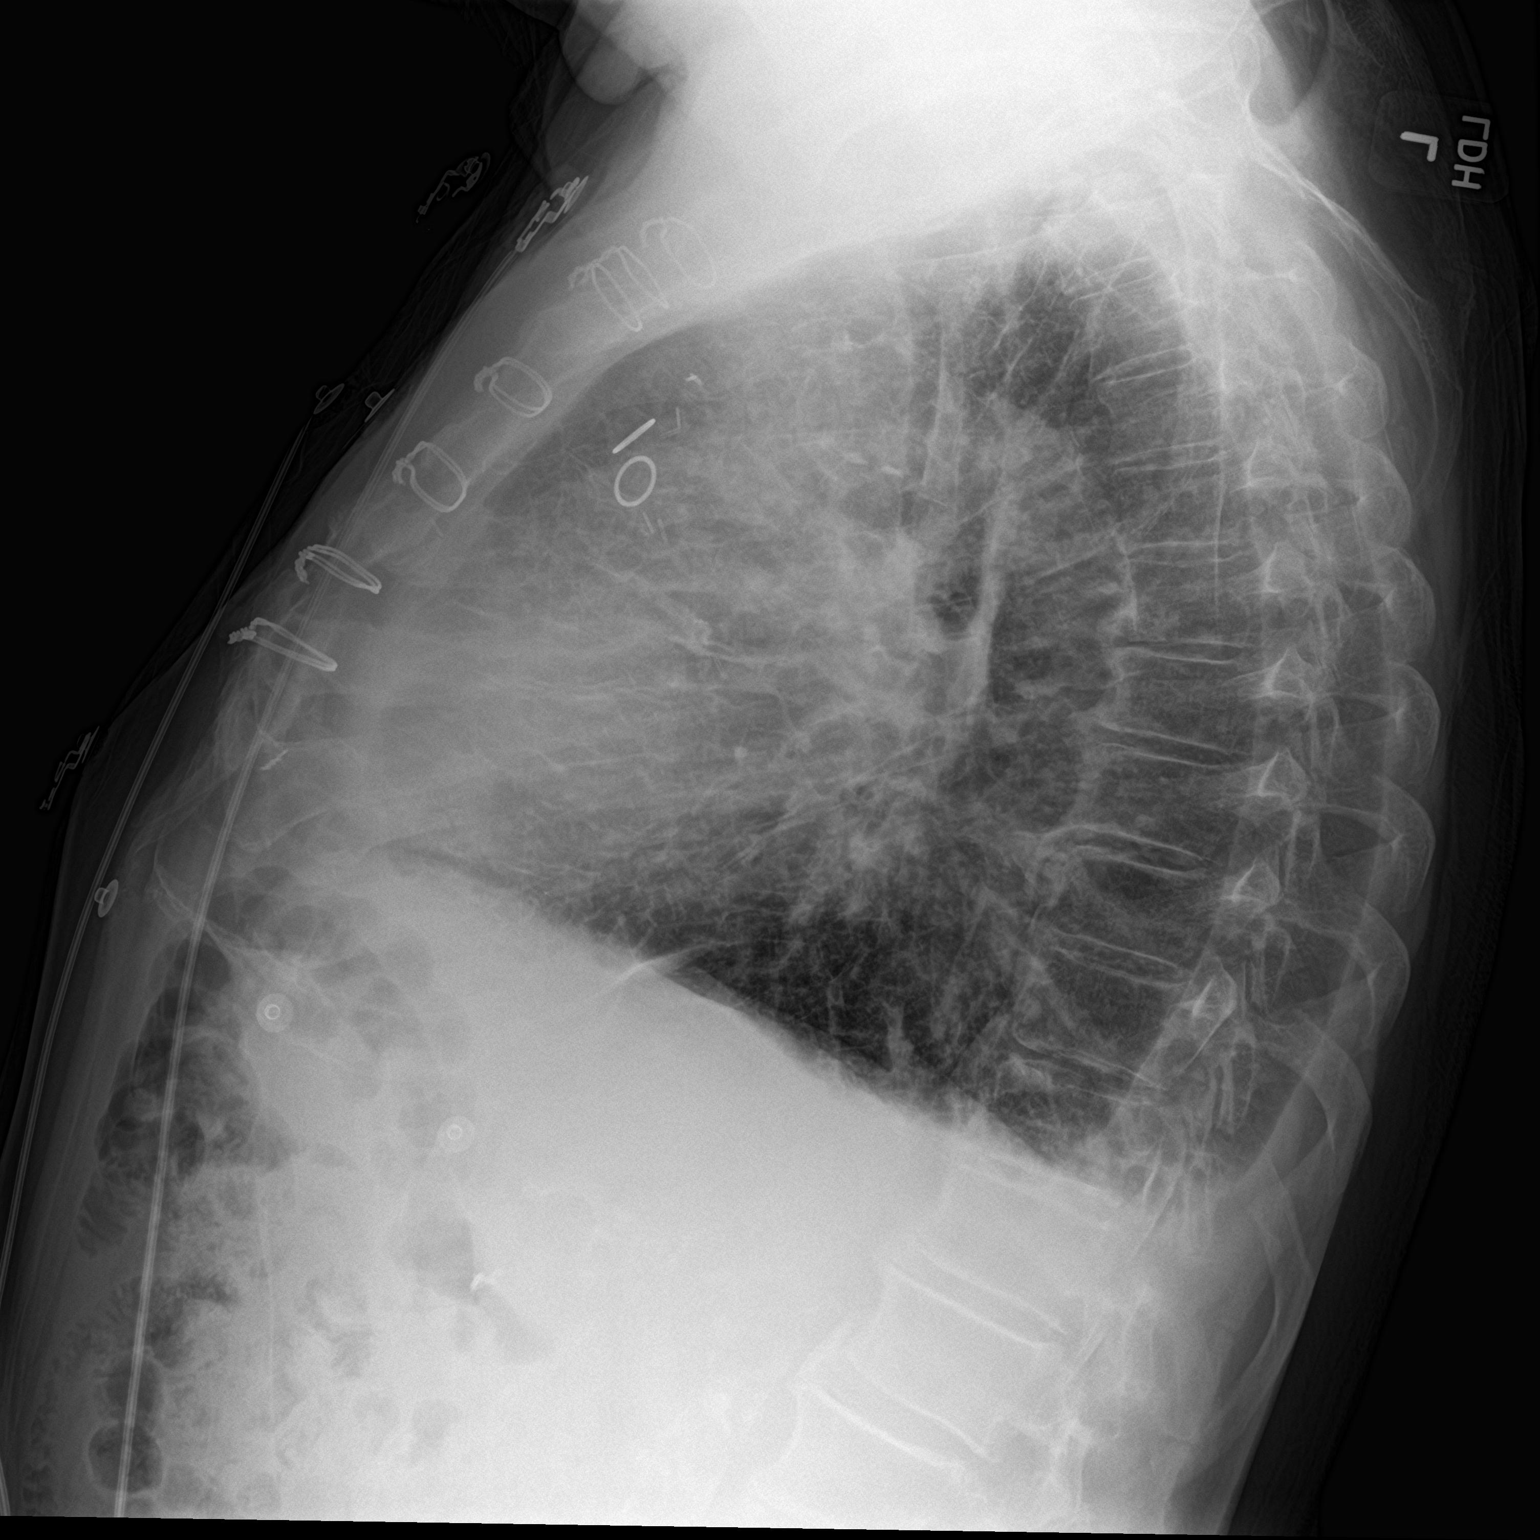

[2 of 2 positions shown; findings below may reference images not displayed]

FINDINGS: Stable cardiomediastinal silhouette. Status post coronary bypass
graft. Mild central pulmonary vascular congestion is noted. Mildly
improved bilateral pulmonary edema is noted. Small bilateral pleural
effusions are noted. Bony thorax is unremarkable.
IMPRESSION: Mild central pulmonary vascular congestion is noted with probable
improved bilateral pulmonary edema.

## 2022-08-22 ENCOUNTER — Encounter: Payer: Self-pay | Admitting: Emergency Medicine

## 2022-08-22 ENCOUNTER — Inpatient Hospital Stay
Admission: EM | Admit: 2022-08-22 | Discharge: 2022-09-16 | DRG: 252 | Disposition: A | Discharge status: Home Health | Payer: No Typology Code available for payment source | Attending: Internal Medicine | Admitting: Internal Medicine

## 2022-08-22 ENCOUNTER — Other Ambulatory Visit: Payer: Self-pay

## 2022-08-22 ENCOUNTER — Emergency Department: Payer: No Typology Code available for payment source

## 2022-08-22 ENCOUNTER — Inpatient Hospital Stay: Payer: No Typology Code available for payment source

## 2022-08-22 DIAGNOSIS — Z951 Presence of aortocoronary bypass graft: Secondary | ICD-10-CM

## 2022-08-22 DIAGNOSIS — I214 Non-ST elevation (NSTEMI) myocardial infarction: Secondary | ICD-10-CM | POA: Diagnosis present

## 2022-08-22 DIAGNOSIS — E11621 Type 2 diabetes mellitus with foot ulcer: Secondary | ICD-10-CM | POA: Diagnosis present

## 2022-08-22 DIAGNOSIS — R7989 Other specified abnormal findings of blood chemistry: Secondary | ICD-10-CM | POA: Insufficient documentation

## 2022-08-22 DIAGNOSIS — I350 Nonrheumatic aortic (valve) stenosis: Secondary | ICD-10-CM | POA: Diagnosis present

## 2022-08-22 DIAGNOSIS — L97419 Non-pressure chronic ulcer of right heel and midfoot with unspecified severity: Secondary | ICD-10-CM | POA: Diagnosis present

## 2022-08-22 DIAGNOSIS — E1122 Type 2 diabetes mellitus with diabetic chronic kidney disease: Secondary | ICD-10-CM | POA: Diagnosis present

## 2022-08-22 DIAGNOSIS — A419 Sepsis, unspecified organism: Secondary | ICD-10-CM | POA: Diagnosis not present

## 2022-08-22 DIAGNOSIS — N1831 Chronic kidney disease, stage 3a: Secondary | ICD-10-CM | POA: Diagnosis present

## 2022-08-22 DIAGNOSIS — I4819 Other persistent atrial fibrillation: Secondary | ICD-10-CM | POA: Diagnosis present

## 2022-08-22 DIAGNOSIS — L03119 Cellulitis of unspecified part of limb: Secondary | ICD-10-CM

## 2022-08-22 DIAGNOSIS — L8990 Pressure ulcer of unspecified site, unspecified stage: Secondary | ICD-10-CM | POA: Insufficient documentation

## 2022-08-22 DIAGNOSIS — Z809 Family history of malignant neoplasm, unspecified: Secondary | ICD-10-CM

## 2022-08-22 DIAGNOSIS — E1151 Type 2 diabetes mellitus with diabetic peripheral angiopathy without gangrene: Secondary | ICD-10-CM | POA: Diagnosis present

## 2022-08-22 DIAGNOSIS — I70221 Atherosclerosis of native arteries of extremities with rest pain, right leg: Secondary | ICD-10-CM | POA: Diagnosis present

## 2022-08-22 DIAGNOSIS — I5023 Acute on chronic systolic (congestive) heart failure: Secondary | ICD-10-CM | POA: Diagnosis not present

## 2022-08-22 DIAGNOSIS — I130 Hypertensive heart and chronic kidney disease with heart failure and stage 1 through stage 4 chronic kidney disease, or unspecified chronic kidney disease: Secondary | ICD-10-CM | POA: Diagnosis present

## 2022-08-22 DIAGNOSIS — E875 Hyperkalemia: Secondary | ICD-10-CM | POA: Diagnosis present

## 2022-08-22 DIAGNOSIS — I272 Pulmonary hypertension, unspecified: Secondary | ICD-10-CM | POA: Diagnosis present

## 2022-08-22 DIAGNOSIS — I480 Paroxysmal atrial fibrillation: Secondary | ICD-10-CM | POA: Diagnosis present

## 2022-08-22 DIAGNOSIS — D631 Anemia in chronic kidney disease: Secondary | ICD-10-CM | POA: Diagnosis present

## 2022-08-22 DIAGNOSIS — I440 Atrioventricular block, first degree: Secondary | ICD-10-CM | POA: Diagnosis present

## 2022-08-22 DIAGNOSIS — N1832 Chronic kidney disease, stage 3b: Secondary | ICD-10-CM | POA: Diagnosis not present

## 2022-08-22 DIAGNOSIS — E785 Hyperlipidemia, unspecified: Secondary | ICD-10-CM | POA: Diagnosis present

## 2022-08-22 DIAGNOSIS — B338 Other specified viral diseases: Secondary | ICD-10-CM | POA: Insufficient documentation

## 2022-08-22 DIAGNOSIS — L03115 Cellulitis of right lower limb: Secondary | ICD-10-CM | POA: Diagnosis present

## 2022-08-22 DIAGNOSIS — I5043 Acute on chronic combined systolic (congestive) and diastolic (congestive) heart failure: Secondary | ICD-10-CM | POA: Diagnosis present

## 2022-08-22 DIAGNOSIS — N170 Acute kidney failure with tubular necrosis: Secondary | ICD-10-CM | POA: Diagnosis not present

## 2022-08-22 DIAGNOSIS — E663 Overweight: Secondary | ICD-10-CM | POA: Diagnosis present

## 2022-08-22 DIAGNOSIS — Z1152 Encounter for screening for COVID-19: Secondary | ICD-10-CM | POA: Diagnosis not present

## 2022-08-22 DIAGNOSIS — E872 Acidosis, unspecified: Secondary | ICD-10-CM | POA: Diagnosis present

## 2022-08-22 DIAGNOSIS — I2583 Coronary atherosclerosis due to lipid rich plaque: Secondary | ICD-10-CM | POA: Diagnosis not present

## 2022-08-22 DIAGNOSIS — Z8249 Family history of ischemic heart disease and other diseases of the circulatory system: Secondary | ICD-10-CM

## 2022-08-22 DIAGNOSIS — I10 Essential (primary) hypertension: Secondary | ICD-10-CM | POA: Diagnosis not present

## 2022-08-22 DIAGNOSIS — Z7902 Long term (current) use of antithrombotics/antiplatelets: Secondary | ICD-10-CM

## 2022-08-22 DIAGNOSIS — I739 Peripheral vascular disease, unspecified: Secondary | ICD-10-CM | POA: Diagnosis present

## 2022-08-22 DIAGNOSIS — I509 Heart failure, unspecified: Secondary | ICD-10-CM

## 2022-08-22 DIAGNOSIS — L97319 Non-pressure chronic ulcer of right ankle with unspecified severity: Secondary | ICD-10-CM | POA: Diagnosis present

## 2022-08-22 DIAGNOSIS — I5021 Acute systolic (congestive) heart failure: Secondary | ICD-10-CM | POA: Diagnosis not present

## 2022-08-22 DIAGNOSIS — I255 Ischemic cardiomyopathy: Secondary | ICD-10-CM | POA: Diagnosis present

## 2022-08-22 DIAGNOSIS — N189 Chronic kidney disease, unspecified: Secondary | ICD-10-CM | POA: Diagnosis not present

## 2022-08-22 DIAGNOSIS — I2510 Atherosclerotic heart disease of native coronary artery without angina pectoris: Secondary | ICD-10-CM | POA: Diagnosis present

## 2022-08-22 DIAGNOSIS — Z79899 Other long term (current) drug therapy: Secondary | ICD-10-CM

## 2022-08-22 DIAGNOSIS — E1129 Type 2 diabetes mellitus with other diabetic kidney complication: Secondary | ICD-10-CM | POA: Diagnosis present

## 2022-08-22 DIAGNOSIS — Z7901 Long term (current) use of anticoagulants: Secondary | ICD-10-CM

## 2022-08-22 DIAGNOSIS — L089 Local infection of the skin and subcutaneous tissue, unspecified: Secondary | ICD-10-CM | POA: Diagnosis not present

## 2022-08-22 DIAGNOSIS — Z66 Do not resuscitate: Secondary | ICD-10-CM | POA: Diagnosis present

## 2022-08-22 DIAGNOSIS — Z6825 Body mass index (BMI) 25.0-25.9, adult: Secondary | ICD-10-CM

## 2022-08-22 DIAGNOSIS — I25118 Atherosclerotic heart disease of native coronary artery with other forms of angina pectoris: Secondary | ICD-10-CM | POA: Diagnosis not present

## 2022-08-22 DIAGNOSIS — Z87891 Personal history of nicotine dependence: Secondary | ICD-10-CM

## 2022-08-22 DIAGNOSIS — Z7984 Long term (current) use of oral hypoglycemic drugs: Secondary | ICD-10-CM

## 2022-08-22 DIAGNOSIS — I25110 Atherosclerotic heart disease of native coronary artery with unstable angina pectoris: Secondary | ICD-10-CM | POA: Diagnosis not present

## 2022-08-22 DIAGNOSIS — J205 Acute bronchitis due to respiratory syncytial virus: Secondary | ICD-10-CM | POA: Diagnosis not present

## 2022-08-22 DIAGNOSIS — N179 Acute kidney failure, unspecified: Secondary | ICD-10-CM | POA: Diagnosis present

## 2022-08-22 DIAGNOSIS — L97929 Non-pressure chronic ulcer of unspecified part of left lower leg with unspecified severity: Secondary | ICD-10-CM | POA: Diagnosis present

## 2022-08-22 DIAGNOSIS — I70233 Atherosclerosis of native arteries of right leg with ulceration of ankle: Secondary | ICD-10-CM | POA: Diagnosis not present

## 2022-08-22 LAB — BASIC METABOLIC PANEL
Anion gap: 8 (ref 5–15)
BUN: 73 mg/dL — ABNORMAL HIGH (ref 8–23)
CO2: 24 mmol/L (ref 22–32)
Calcium: 8.5 mg/dL — ABNORMAL LOW (ref 8.9–10.3)
Chloride: 106 mmol/L (ref 98–111)
Creatinine, Ser: 2.84 mg/dL — ABNORMAL HIGH (ref 0.61–1.24)
GFR, Estimated: 22 mL/min — ABNORMAL LOW (ref >60)
Glucose, Bld: 241 mg/dL — ABNORMAL HIGH (ref 70–99)
Potassium: 5.7 mmol/L — ABNORMAL HIGH (ref 3.5–5.1)
Sodium: 138 mmol/L (ref 135–145)

## 2022-08-22 LAB — TROPONIN I (HIGH SENSITIVITY): Troponin I (High Sensitivity): 38 ng/L — ABNORMAL HIGH (ref <18)

## 2022-08-22 LAB — CBC
HCT: 36.5 % — ABNORMAL LOW (ref 39.0–52.0)
Hemoglobin: 10.6 g/dL — ABNORMAL LOW (ref 13.0–17.0)
MCH: 28.6 pg (ref 26.0–34.0)
MCHC: 29 g/dL — ABNORMAL LOW (ref 30.0–36.0)
MCV: 98.6 fL (ref 80.0–100.0)
Platelets: 260 10*3/uL (ref 150–400)
RBC: 3.7 MIL/uL — ABNORMAL LOW (ref 4.22–5.81)
RDW: 20.9 % — ABNORMAL HIGH (ref 11.5–15.5)
WBC: 11.1 10*3/uL — ABNORMAL HIGH (ref 4.0–10.5)
nRBC: 0 % (ref 0.0–0.2)

## 2022-08-22 LAB — LIPID PANEL
Cholesterol: 75 mg/dL (ref 0–200)
HDL: 33 mg/dL — ABNORMAL LOW (ref >40)
LDL Cholesterol: 17 mg/dL (ref 0–99)
Total CHOL/HDL Ratio: 2.3 RATIO
Triglycerides: 125 mg/dL (ref <150)
VLDL: 25 mg/dL (ref 0–40)

## 2022-08-22 LAB — CBG MONITORING, ED: Glucose-Capillary: 174 mg/dL — ABNORMAL HIGH (ref 70–99)

## 2022-08-22 LAB — MAGNESIUM: Magnesium: 3.1 mg/dL — ABNORMAL HIGH (ref 1.7–2.4)

## 2022-08-22 LAB — BRAIN NATRIURETIC PEPTIDE: B Natriuretic Peptide: >4500 pg/mL — ABNORMAL HIGH (ref 0.0–100.0)

## 2022-08-22 MED ORDER — SPIRONOLACTONE 25 MG PO TABS: 25.0000 mg | ORAL_TABLET | Freq: Every day | ORAL | Status: DC

## 2022-08-22 MED ORDER — METOPROLOL SUCCINATE ER 50 MG PO TB24
50.0000 mg | ORAL_TABLET | Freq: Every day | ORAL | Status: DC
  Administered 2022-08-23 – 2022-09-16 (×23): 50 mg via ORAL
  Filled 2022-08-22 (×25): qty 1

## 2022-08-22 MED ORDER — CLOPIDOGREL BISULFATE 75 MG PO TABS: 75.0000 mg | ORAL_TABLET | Freq: Every day | ORAL | Status: DC

## 2022-08-22 MED ORDER — TRAVOPROST (BAK FREE) 0.004 % OP SOLN
1.0000 [drp] | Freq: Every day | OPHTHALMIC | Status: DC
  Administered 2022-08-23 – 2022-09-15 (×24): 1 [drp] via OPHTHALMIC
  Filled 2022-08-22: qty 2.5

## 2022-08-22 MED ORDER — DM-GUAIFENESIN ER 30-600 MG PO TB12: 1.0000 | ORAL_TABLET | Freq: Two times a day (BID) | ORAL | Status: DC | PRN

## 2022-08-22 MED ORDER — ISOSORBIDE MONONITRATE ER 60 MG PO TB24
90.0000 mg | ORAL_TABLET | Freq: Every day | ORAL | Status: DC
  Administered 2022-08-23 – 2022-08-26 (×4): 90 mg via ORAL
  Filled 2022-08-22 (×3): qty 1
  Filled 2022-08-22: qty 2

## 2022-08-22 MED ORDER — INSULIN ASPART 100 UNIT/ML IJ SOLN
0.0000 [IU] | Freq: Every day | INTRAMUSCULAR | Status: DC
  Administered 2022-08-23 – 2022-08-31 (×4): 2 [IU] via SUBCUTANEOUS
  Administered 2022-09-06: 3 [IU] via SUBCUTANEOUS
  Administered 2022-09-07 – 2022-09-11 (×3): 2 [IU] via SUBCUTANEOUS
  Administered 2022-09-12: 3 [IU] via SUBCUTANEOUS
  Administered 2022-09-13: 2 [IU] via SUBCUTANEOUS
  Filled 2022-08-22 (×12): qty 1

## 2022-08-22 MED ORDER — FUROSEMIDE 10 MG/ML IJ SOLN
80.0000 mg | Freq: Once | INTRAMUSCULAR | Status: AC
  Administered 2022-08-22: 80 mg via INTRAVENOUS
  Filled 2022-08-22: qty 8

## 2022-08-22 MED ORDER — VITAMIN B-12 1000 MCG PO TABS
1000.0000 ug | ORAL_TABLET | Freq: Every day | ORAL | Status: DC
  Administered 2022-08-23 – 2022-09-16 (×24): 1000 ug via ORAL
  Filled 2022-08-22 (×8): qty 1
  Filled 2022-08-22: qty 2
  Filled 2022-08-22 (×16): qty 1

## 2022-08-22 MED ORDER — PANTOPRAZOLE SODIUM 40 MG PO TBEC
40.0000 mg | DELAYED_RELEASE_TABLET | Freq: Every day | ORAL | Status: DC
  Administered 2022-08-23 – 2022-09-16 (×24): 40 mg via ORAL
  Filled 2022-08-22 (×24): qty 1

## 2022-08-22 MED ORDER — NITROGLYCERIN 0.4 MG SL SUBL
0.4000 mg | SUBLINGUAL_TABLET | SUBLINGUAL | Status: DC | PRN
  Administered 2022-08-28 – 2022-09-06 (×7): 0.4 mg via SUBLINGUAL
  Filled 2022-08-22 (×5): qty 1

## 2022-08-22 MED ORDER — SODIUM CHLORIDE 0.9 % IV SOLN
1.0000 g | Freq: Once | INTRAVENOUS | Status: AC
  Administered 2022-08-22: 1 g via INTRAVENOUS
  Filled 2022-08-22: qty 10

## 2022-08-22 MED ORDER — BRIMONIDINE TARTRATE 0.2 % OP SOLN
1.0000 [drp] | Freq: Three times a day (TID) | OPHTHALMIC | Status: DC
  Administered 2022-08-23 – 2022-09-16 (×68): 1 [drp] via OPHTHALMIC
  Filled 2022-08-22: qty 5

## 2022-08-22 MED ORDER — AMIODARONE HCL 200 MG PO TABS
100.0000 mg | ORAL_TABLET | Freq: Every day | ORAL | Status: DC
  Administered 2022-08-23 – 2022-09-16 (×23): 100 mg via ORAL
  Filled 2022-08-22 (×23): qty 1

## 2022-08-22 MED ORDER — INSULIN ASPART 100 UNIT/ML IJ SOLN
0.0000 [IU] | Freq: Three times a day (TID) | INTRAMUSCULAR | Status: DC
  Administered 2022-08-23: 2 [IU] via SUBCUTANEOUS
  Administered 2022-08-23: 3 [IU] via SUBCUTANEOUS
  Administered 2022-08-24: 1 [IU] via SUBCUTANEOUS
  Administered 2022-08-24 – 2022-08-25 (×3): 2 [IU] via SUBCUTANEOUS
  Administered 2022-08-25: 3 [IU] via SUBCUTANEOUS
  Administered 2022-08-26 (×3): 2 [IU] via SUBCUTANEOUS
  Administered 2022-08-27 – 2022-08-28 (×4): 3 [IU] via SUBCUTANEOUS
  Administered 2022-08-29: 1 [IU] via SUBCUTANEOUS
  Administered 2022-08-29 (×2): 3 [IU] via SUBCUTANEOUS
  Administered 2022-08-30 (×2): 1 [IU] via SUBCUTANEOUS
  Administered 2022-08-30: 2 [IU] via SUBCUTANEOUS
  Administered 2022-08-31: 3 [IU] via SUBCUTANEOUS
  Administered 2022-08-31: 2 [IU] via SUBCUTANEOUS
  Administered 2022-08-31: 1 [IU] via SUBCUTANEOUS
  Administered 2022-09-01 (×2): 3 [IU] via SUBCUTANEOUS
  Administered 2022-09-02: 1 [IU] via SUBCUTANEOUS
  Administered 2022-09-02: 3 [IU] via SUBCUTANEOUS
  Administered 2022-09-03: 2 [IU] via SUBCUTANEOUS
  Administered 2022-09-03: 3 [IU] via SUBCUTANEOUS
  Administered 2022-09-03: 1 [IU] via SUBCUTANEOUS
  Administered 2022-09-04: 3 [IU] via SUBCUTANEOUS
  Administered 2022-09-04: 2 [IU] via SUBCUTANEOUS
  Administered 2022-09-04 – 2022-09-05 (×2): 1 [IU] via SUBCUTANEOUS
  Administered 2022-09-05 – 2022-09-06 (×3): 2 [IU] via SUBCUTANEOUS
  Administered 2022-09-06: 3 [IU] via SUBCUTANEOUS
  Administered 2022-09-07: 7 [IU] via SUBCUTANEOUS
  Administered 2022-09-07: 3 [IU] via SUBCUTANEOUS
  Administered 2022-09-07: 5 [IU] via SUBCUTANEOUS
  Administered 2022-09-08 – 2022-09-09 (×6): 2 [IU] via SUBCUTANEOUS
  Administered 2022-09-10: 1 [IU] via SUBCUTANEOUS
  Administered 2022-09-10: 5 [IU] via SUBCUTANEOUS
  Administered 2022-09-10: 2 [IU] via SUBCUTANEOUS
  Administered 2022-09-11: 3 [IU] via SUBCUTANEOUS
  Administered 2022-09-11 – 2022-09-12 (×3): 1 [IU] via SUBCUTANEOUS
  Administered 2022-09-12: 2 [IU] via SUBCUTANEOUS
  Administered 2022-09-13 – 2022-09-14 (×3): 1 [IU] via SUBCUTANEOUS
  Administered 2022-09-14: 3 [IU] via SUBCUTANEOUS
  Administered 2022-09-15: 1 [IU] via SUBCUTANEOUS
  Administered 2022-09-15 – 2022-09-16 (×3): 2 [IU] via SUBCUTANEOUS
  Filled 2022-08-22 (×58): qty 1

## 2022-08-22 MED ORDER — ATORVASTATIN CALCIUM 80 MG PO TABS
80.0000 mg | ORAL_TABLET | Freq: Every day | ORAL | Status: DC
  Administered 2022-08-22 – 2022-09-15 (×25): 80 mg via ORAL
  Filled 2022-08-22 (×4): qty 1
  Filled 2022-08-22: qty 4
  Filled 2022-08-22 (×4): qty 1
  Filled 2022-08-22 (×3): qty 4
  Filled 2022-08-22 (×5): qty 1
  Filled 2022-08-22: qty 4
  Filled 2022-08-22 (×3): qty 1
  Filled 2022-08-22: qty 4
  Filled 2022-08-22 (×3): qty 1

## 2022-08-22 MED ORDER — TIMOLOL MALEATE 0.5 % OP SOLN
1.0000 [drp] | Freq: Every day | OPHTHALMIC | Status: DC
  Administered 2022-08-23 – 2022-09-15 (×24): 1 [drp] via OPHTHALMIC
  Filled 2022-08-22 (×2): qty 5

## 2022-08-22 MED ORDER — HYDROCODONE-ACETAMINOPHEN 5-325 MG PO TABS
1.0000 | ORAL_TABLET | Freq: Four times a day (QID) | ORAL | Status: DC | PRN
  Administered 2022-08-23 – 2022-08-30 (×13): 1 via ORAL
  Filled 2022-08-22 (×13): qty 1

## 2022-08-22 MED ORDER — FUROSEMIDE 10 MG/ML IJ SOLN
40.0000 mg | Freq: Two times a day (BID) | INTRAMUSCULAR | Status: DC
  Administered 2022-08-23 – 2022-08-25 (×5): 40 mg via INTRAVENOUS
  Filled 2022-08-22 (×5): qty 4

## 2022-08-22 MED ORDER — APIXABAN 2.5 MG PO TABS
2.5000 mg | ORAL_TABLET | Freq: Two times a day (BID) | ORAL | Status: DC
  Administered 2022-08-22 – 2022-08-27 (×10): 2.5 mg via ORAL
  Filled 2022-08-22 (×10): qty 1

## 2022-08-22 MED ORDER — SODIUM CHLORIDE 0.9 % IV SOLN
1.0000 g | INTRAVENOUS | Status: DC
  Administered 2022-08-23 – 2022-08-24 (×2): 1 g via INTRAVENOUS
  Filled 2022-08-22: qty 1
  Filled 2022-08-22 (×2): qty 10
  Filled 2022-08-22: qty 1

## 2022-08-22 MED ORDER — BRINZOLAMIDE 1 % OP SUSP
1.0000 [drp] | Freq: Three times a day (TID) | OPHTHALMIC | Status: DC
  Administered 2022-08-23 – 2022-09-16 (×68): 1 [drp] via OPHTHALMIC
  Filled 2022-08-22: qty 10

## 2022-08-22 MED ORDER — POLYETHYLENE GLYCOL 3350 17 G PO PACK: 17.0000 g | PACK | Freq: Every day | ORAL | Status: DC | PRN

## 2022-08-22 MED ORDER — SODIUM ZIRCONIUM CYCLOSILICATE 10 G PO PACK
10.0000 g | PACK | Freq: Once | ORAL | Status: DC
  Filled 2022-08-22: qty 1

## 2022-08-22 MED ORDER — ALBUTEROL SULFATE (2.5 MG/3ML) 0.083% IN NEBU
3.0000 mL | INHALATION_SOLUTION | RESPIRATORY_TRACT | Status: DC | PRN
  Administered 2022-09-01 (×2): 3 mL via RESPIRATORY_TRACT
  Filled 2022-08-22 (×2): qty 3

## 2022-08-22 NOTE — ED Triage Notes (Signed)
Patient c/o bilateral lower leg swelling and wounds. Patient states they have been there since beginning of the month. Patient states both are now oozing fluid. States unable to walk across room without become short of breath since beginning of December. Patient states he was recently hospitalized at [facility] for his CHF.  Patient had legs wrapped this morning by nurse and that his niece has been wrapping them every other day.

## 2022-08-22 NOTE — ED Provider Triage Note (Signed)
Emergency Medicine Provider Triage Evaluation Note  Bradley [name] , a 81 y.o. male  was evaluated in triage.  Pt complains of bilateral leg edema and fluid oozing from leg. Hx of CHF and recently at [facility].  Taking medications as directed.  C/o SOB with walking.    Review of Systems  Positive: SOB Negative: No CP  Physical Exam  BP (!) 121/54 (BP Location: Left Arm)   Pulse (!) 50   Temp 97.6 F (36.4 C) (Oral)   Resp 16   Ht 6' (1.829 m)   Wt 85.7 kg   SpO2 100%   BMI 25.63 kg/m  Gen:   Awake, no distress  Talking in complete sentences. Resp:  Normal effort No wheezing or rales noted MSK:   Lower extremities edematous Other:    Medical Decision Making  Medically screening exam initiated at 11:45 AM.  Appropriate orders placed.  Bradley [name] was informed that the remainder of the evaluation will be completed by another provider, this initial triage assessment does not replace that evaluation, and the importance of remaining in the ED until their evaluation is complete.     Tommi Rumps, PA-C 08/22/22 1148

## 2022-08-22 NOTE — ED Provider Notes (Signed)
[facility] Provider Note    Event Date/Time   First MD Initiated Contact with Patient 08/22/22 1508     (approximate)  History   Chief Complaint: Edema  HPI  Bradley [name] is a 81 y.o. male with a past medical history of CHF, CAD, diabetes, hypertension, hyperlipidemia who presents to the emergency department referred by his doctor for worsening swelling and concern for cellulitis.  According to the patient he was sent from the [place] today to our hospital for increased lower extremity edema shortness of breath worse with exertion and now concerns for cellulitis given worsening erythema of the legs now with weepage.  Patient denies any fever.  Does state significant shortness of breath with exertion and states minimal shortness of breath while lying in bed.  Patient has significant weepage to the lower extremities.  Physical Exam   Triage Vital Signs: ED Triage Vitals  Enc Vitals Group     BP 08/22/22 1128 (!) 121/54     Pulse Rate 08/22/22 1128 (!) 50     Resp 08/22/22 1128 16     Temp 08/22/22 1128 97.6 F (36.4 C)     Temp Source 08/22/22 1128 Oral     SpO2 08/22/22 1128 100 %     Weight 08/22/22 1134 189 lb (85.7 kg)     Height 08/22/22 1134 6' (1.829 m)     Head Circumference --      Peak Flow --      Pain Score 08/22/22 1134 0     Pain Loc --      Pain Edu? --      Excl. in GC? --     Most recent vital signs: Vitals:   08/22/22 1128  BP: (!) 121/54  Pulse: (!) 50  Resp: 16  Temp: 97.6 F (36.4 C)  SpO2: 100%    General: Awake, no distress.  CV:  Good peripheral perfusion.  Regular rate and rhythm  Resp:  Normal effort.  Equal breath sounds bilaterally.  Abd:  No distention.  Soft, nontender.  No rebound or guarding. Other:  2-3+ lower extreme edema bilaterally with weepage bilaterally and soaked lower extremity dressings which have been removed.  Several small skin lesions that are weeping as well with erythema concerning for possible  superinfection.   ED Results / Procedures / Treatments   EKG  EKG viewed and interpreted by myself shows sinus bradycardia 50 bpm with a narrow QRS, normal axis, normal intervals, nonspecific ST changes mild ST depression/inversions in the inferolateral leads.  Unchanged from prior EKG 06/10/2022.  RADIOLOGY  Chest x-ray viewed and interpreted by myself appears to show haziness in the left lower lung most consistent with pulmonary edema.  Possible small pleural effusions as well. Radiology has read low lung volumes with pleural effusions.  No consolidations.   MEDICATIONS ORDERED IN ED: Medications  cefTRIAXone (ROCEPHIN) 1 g in sodium chloride 0.9 % 100 mL IVPB (has no administration in time range)  furosemide (LASIX) injection 80 mg (has no administration in time range)     IMPRESSION / MDM / ASSESSMENT AND PLAN / ED COURSE  I reviewed the triage vital signs and the nursing notes.  Patient's presentation is most consistent with acute presentation with potential threat to life or bodily function.  Patient presents emergency department for worsening shortness of breath especially with exertion as well as increased lower extreme edema now with weepage erythema and skin lesions.  Patient's exam is consistent with cellulitis  likely due to superinfection of his weeping lower extremities.  BNP has resulted greater than 4500, CBC shows no concerning findings slight leukocytosis, chemistry does show renal insufficiency.  We will dose 80 mg of IV Lasix we will start the patient on Rocephin and check blood cultures as a precaution.  Patient was sent from the [place] he was told that he could come to [facility] and they would cover his admission.  Will admit to our hospital per his wishes.  I spoke to the hospitalist who will be admitting to their service for further workup and treatment.  FINAL CLINICAL IMPRESSION(S) / ED DIAGNOSES   CHF exacerbation Cellulitis   Note:  This document was prepared  using Dragon voice recognition software and may include unintentional dictation errors.   Minna Antis, MD 08/22/22 [phone]

## 2022-08-22 NOTE — ED Triage Notes (Signed)
First Nurse NOte:  Sent by Lenn Sink for evaluation of CHF.  Patient is AAOx3.  Skin warm and dry. NAD

## 2022-08-22 NOTE — H&P (Signed)
History and Physical    Bradley [name] FUX:[id number] DOB: 1940-10-13 DOA: 08/22/2022  Referring MD/NP/PA:   PCP: System, Provider Not In   Patient coming from:  The patient is coming from home.  At baseline, pt is independent for most of ADL.        Chief Complaint: SOB, worsening leg edema  HPI: Bradley [name] is a 81 y.o. 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  Patient states that he has chronic shortness of breath, which has been progressively worsening in the past several weeks, much worse today.  Patient does not have cough, fever or chills.  Patient has intermittent chest pain, which is located in the front chest, intermittent, exertional, mild, pressure-like, nonradiating.  Currently no active chest pain.  Patient does not have nausea vomiting, diarrhea or abdominal pain.  No symptoms of UTI. He has worsening bilateral leg edema. Both legs are oozing fluid. He has multiple skin tear in legs. According to the patient he was sent from the [place] today to our hospital with concerns for leg cellulitis.  Data reviewed independently and ED Course: pt was found to have BNP > 4500,  WBC 11.1, potassium 5.7, worsening renal function, temperature normal, blood pressure 121/54, heart rate 50, RR 16, oxygen saturation 100% on room air.  Chest x-ray showed low volume without infiltration.  Patient is admitted to telemetry bed as inpatient   EKG: I have personally reviewed.  Sinus rhythm, QTc 435, T wave inversion in inferior leads and V3-V6.   Review of Systems:   General: no fevers, chills, has fatigue HEENT: no blurry vision, hearing changes or sore throat Respiratory: has dyspnea, no coughing, wheezing CV: no chest pain, no palpitations GI: no nausea, vomiting, abdominal pain, diarrhea, constipation GU: no dysuria, burning on  urination, increased urinary frequency, hematuria  Ext: has leg edema Neuro: no unilateral weakness, numbness, or tingling, no vision change or hearing loss Skin: no rash, has skin tear in legs MSK: No muscle spasm, no deformity, no limitation of range of movement in spin Heme: No easy bruising.  Travel history: No recent long distant travel.   Allergy:  Allergies  Allergen Reactions   Simvastatin Other (See Comments)    Per [place] records Per [facility] records  Per [facility] records Per [place] records    Past Medical History:  Diagnosis Date   (HFpEF) heart failure with preserved ejection fraction ([place])    Coronary artery disease    Diabetes mellitus without complication ([place])    Hyperlipidemia    Hypertension     Past Surgical History:  Procedure Laterality Date   CATARACT EXTRACTION     CHOLECYSTECTOMY     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   [facility]: LIMA-LAD, SVG-OM1, and SVG-rPDA   INTRAMEDULLARY (IM) NAIL INTERTROCHANTERIC Right 03/12/2022   Procedure: INTRAMEDULLARY (IM) NAIL INTERTROCHANTRIC;  Surgeon: Corky Mull, MD;  Location: [facility] ORS;  Service: Orthopedics;  Laterality: Right;   LEFT HEART CATH AND CORS/GRAFTS ANGIOGRAPHY N/A 07/10/2021   Procedure: LEFT HEART CATH AND CORS/GRAFTS ANGIOGRAPHY;  Surgeon: Nelva Bush, MD;  Location: [facility];  Service: Cardiovascular;  Laterality: N/A;    Social History:  reports that he has quit smoking. He has never used smokeless tobacco. He reports that he does not drink alcohol and does not use drugs.  Family History:  Family History  Problem  Relation Age of Onset   CAD Mother    Urinary tract infection Father    Hip fracture Father    Cancer Sister    Cancer Brother      Prior to Admission medications   Medication Sig Start Date End Date Taking? Authorizing Provider  acetaminophen (TYLENOL) 325 MG tablet Take 650-1,300 mg by mouth 2 (two) times daily as needed.    [provider]  amiodarone  (PACERONE) 200 MG tablet Take 200 mg by mouth daily.    [provider]  apixaban (ELIQUIS) 5 MG TABS tablet Take 1 tablet (5 mg total) by mouth 2 (two) times daily. 08/06/21   Loletha Grayer, MD  atorvastatin (LIPITOR) 80 MG tablet TAKE ONE TABLET BY MOUTH AT BEDTIME FOR CHOLESTEROL 09/19/21   [provider]  Brinzolamide-Brimonidine 1-0.2 % SUSP Place 1 drop into the left eye 3 (three) times daily.    [provider]  carboxymethylcellulose 1 % ophthalmic solution Apply 1 drop to eye at bedtime.    [provider]  clopidogrel (PLAVIX) 75 MG tablet Take 1 tablet (75 mg total) by mouth daily with breakfast. 07/12/21   Nicole Kindred A, DO  empagliflozin (JARDIANCE) 25 MG TABS tablet Take 12.5 mg by mouth every morning.    [provider]  furosemide (LASIX) 40 MG tablet Take 1 tablet (40 mg total) by mouth daily. 07/12/21   Nicole Kindred A, DO  glipiZIDE (GLUCOTROL) 5 MG tablet Take by mouth.    [provider]  HYDROcodone-acetaminophen (NORCO/VICODIN) 5-325 MG tablet Take 1 tablet by mouth every 6 (six) hours as needed for moderate pain. 03/14/22   Wouk, Ailene Rud, MD  isosorbide mononitrate (IMDUR) 60 MG 24 hr tablet Take 1 tablet (60 mg total) by mouth daily. 08/07/21   Loletha Grayer, MD  metoprolol succinate (TOPROL-XL) 50 MG 24 hr tablet Take by mouth. 09/19/21 09/19/22  [provider]  nitroGLYCERIN (NITROSTAT) 0.4 MG SL tablet Place 1 tablet (0.4 mg total) under the tongue every 5 (five) minutes x 3 doses as needed for chest pain. 07/11/21   Nicole Kindred A, DO  pantoprazole (PROTONIX) 40 MG tablet Take 40 mg by mouth daily.    [provider]  polyethylene glycol (MIRALAX / GLYCOLAX) 17 g packet Take 17 g by mouth daily. 03/15/22   Wouk, Ailene Rud, MD  Propylene Glycol 0.6 % SOLN Place 1 drop into both eyes 4 (four) times daily.    [provider]  sacubitril-valsartan (ENTRESTO) 24-26 MG Take 1  tablet by mouth every 12 (twelve) hours. 09/18/21   [provider]  spironolactone (ALDACTONE) 25 MG tablet Take 1 tablet by mouth daily. 10/18/21   [provider]  timolol (TIMOPTIC-XR) 0.5 % ophthalmic gel-forming Place 1 drop into both eyes daily. 10/29/20   [provider]  travoprost, benzalkonium, (TRAVATAN) 0.004 % ophthalmic solution Place 1 drop into both eyes nightly.    [provider]  vitamin B-12 (CYANOCOBALAMIN) 500 MCG tablet Take 1,000 mcg by mouth daily. Before breakfast    [provider]    Physical Exam: Vitals:   08/22/22 1128 08/22/22 1134 08/22/22 1427 08/22/22 1748  BP: (!) 121/54     Pulse: (!) 50     Resp: 16     Temp: 97.6 F (36.4 C)   97.7 F (36.5 C)  TempSrc: Oral   Oral  SpO2: 100%     Weight:  85.7 kg 85.7 kg   Height:  6' (  1.829 m) 6' (1.829 m)    General: Not in acute distress HEENT:       Eyes: PERRL, EOMI, no scleral icterus.       ENT: No discharge from the ears and nose, no pharynx injection, no tonsillar enlargement.        Neck: positive JVD, no bruit, no mass felt. Heme: No neck lymph node enlargement. Cardiac: S1/S2, RRR, No murmurs, No gallops or rubs. Respiratory: has fine crackles bilaterally GI: Soft, nondistended, nontender, no rebound pain, no organomegaly, BS present. GU: No hematuria Ext: has 2+ pitting leg edema bilaterally.  Both lower extremities are cool, but has dopplerable DP/PT puls in both legs (I personally did Doppler) Musculoskeletal: No joint deformities, No joint redness or warmth, no limitation of ROM in spin. Skin: Has multiple skin tears in both legs.  Has erythema and tenderness in both lower legs.     Neuro: Alert, oriented X3, cranial nerves II-XII grossly intact, moves all extremities normally. Psych: Patient is not psychotic, no suicidal or hemocidal ideation.  Labs on Admission: I have personally reviewed following labs and imaging studies  CBC: Recent Labs   Lab 08/22/22 1145  WBC 11.1*  HGB 10.6*  HCT 36.5*  MCV 98.6  PLT 937   Basic Metabolic Panel: Recent Labs  Lab 08/22/22 1145  NA 138  K 5.7*  CL 106  CO2 24  GLUCOSE 241*  BUN 73*  CREATININE 2.84*  CALCIUM 8.5*  MG 3.1*   GFR: Estimated Creatinine Clearance: 22.4 mL/min (A) (by C-G formula based on SCr of 2.84 mg/dL (H)). Liver Function Tests: No results for input(s): "AST", "ALT", "ALKPHOS", "BILITOT", "PROT", "ALBUMIN" in the last 168 hours. No results for input(s): "LIPASE", "AMYLASE" in the last 168 hours. No results for input(s): "AMMONIA" in the last 168 hours. Coagulation Profile: No results for input(s): "INR", "PROTIME" in the last 168 hours. Cardiac Enzymes: No results for input(s): "CKTOTAL", "CKMB", "CKMBINDEX", "TROPONINI" in the last 168 hours. BNP (last 3 results) No results for input(s): "PROBNP" in the last 8760 hours. HbA1C: No results for input(s): "HGBA1C" in the last 72 hours. CBG: No results for input(s): "GLUCAP" in the last 168 hours. Lipid Profile: No results for input(s): "CHOL", "HDL", "LDLCALC", "TRIG", "CHOLHDL", "LDLDIRECT" in the last 72 hours. Thyroid Function Tests: No results for input(s): "TSH", "T4TOTAL", "FREET4", "T3FREE", "THYROIDAB" in the last 72 hours. Anemia Panel: No results for input(s): "VITAMINB12", "FOLATE", "FERRITIN", "TIBC", "IRON", "RETICCTPCT" in the last 72 hours. Urine analysis: No results found for: "COLORURINE", "APPEARANCEUR", "LABSPEC", "PHURINE", "GLUCOSEU", "HGBUR", "BILIRUBINUR", "KETONESUR", "PROTEINUR", "UROBILINOGEN", "NITRITE", "LEUKOCYTESUR" Sepsis Labs: _0 (procalcitonin:4,lacticidven:4) )No results found for this or any previous visit (from the past 240 hour(s)).   Radiological Exams on Admission: DG Chest 2 View  Result Date: 08/22/2022 CLINICAL DATA:  History of congestive heart failure with dyspnea on exertion and bilateral leg edema EXAM: CHEST - 2 VIEW COMPARISON:  Chest  radiograph dated 03/12/2022 FINDINGS: Low lung volumes. No focal consolidations. Trace bilateral pleural effusions. No pneumothorax. Similar postsurgical cardiomediastinal silhouette. Median sternotomy wires are nondisplaced. Surgical clips project over the mediastinum. IMPRESSION: Low lung volumes with trace bilateral pleural effusions. No focal consolidations. Electronically Signed   By: Darrin Nipper M.D.   On: 08/22/2022 12:12      Assessment/Plan Principal Problem:   Acute on chronic combined systolic and diastolic CHF (congestive heart failure) (HCC) Active Problems:   Cellulitis of lower extremity   CAD (coronary artery disease)   Essential hypertension  Acute renal failure superimposed on stage 3a chronic kidney disease (HCC)   HLD (hyperlipidemia)   Type II diabetes mellitus with renal manifestations (HCC)   Paroxysmal atrial fibrillation (HCC)   Hyperkalemia   PVD (peripheral vascular disease) (HCC)   Assessment and Plan:   Acute on chronic combined systolic and diastolic CHF (congestive heart failure) ([place]): 2D echo on 03/12/2022 showed EF of 40-45% with grade 2 diastolic dysfunction.  BNP> 4500.  -Will admit to tele bed as inpatient -Lasix 40 mg bid by IV (patient received 80 mg of Lasix in ED. -Spironolactone -trend trop -Daily weights -strict I/O's -Low salt diet -Fluid restriction -Obtain REDs Vest reading  Possible cellulitis of lower extremity: - Empiric antimicrobial treatment with Rocephin - Blood cultures x 2  - ESR and CRP - wound care consult - LE doppler to r/o DVT  CAD (coronary artery disease):  has intermittent exertional chest pain. -Lipitor, Plavix, imdur,  -Trend troponin -check A1 and FLP  Essential hypertension -IV hydralazine as needed -Metoprolol -Patient is on IV Lasix  Acute renal failure superimposed on stage 3a chronic kidney disease ([place]): Baseline creatinine 1.38 on 03/14/2022.  His creatinine is at 2. 84, BUN 73, GFR 22.  Possibly  due to cardiorenal syndrome -Hold Entresto -Avoid using renal toxic medications  HLD (hyperlipidemia) -Lipitor  Type II diabetes mellitus with renal manifestations ([place]): Recent A1c 7.8.  Poorly controlled.  Patient taking glipizide and Jardiance at home -Sliding scale insulin  Paroxysmal atrial fibrillation ([place]): Heart rate 50 -Metoprolol -Eliquis  Hyperkalemia: Potassium 5.7 -10 g of leukoma  PVD (peripheral vascular disease) (HCC) -Follow-up ABI -Patient is on Plavix and Lipitor    DVT ppx: on Eliquis  Code Status: DNR (I discussed with patient in the presence of his niece, and explained the meaning of CODE STATUS. Patient wants to be DNR)  Family Communication:  Yes, patient's niece   at bed side.      Disposition Plan:  Anticipate discharge back to previous environment  Consults called:  none  Admission status and Level of care: Telemetry Cardiac:  as inpt      Dispo: The patient is from: Home              Anticipated d/c is to: Home              Anticipated d/c date is: 2 days              Patient currently is not medically stable to d/c.    Severity of Illness:  The appropriate patient status for this patient is INPATIENT. Inpatient status is judged to be reasonable and necessary in order to provide the required intensity of service to ensure the patient's safety. The patient's presenting symptoms, physical exam findings, and initial radiographic and laboratory data in the context of their chronic comorbidities is felt to place them at high risk for further clinical deterioration. Furthermore, it is not anticipated that the patient will be medically stable for discharge from the hospital within 2 midnights of admission.   * I certify that at the point of admission it is my clinical judgment that the patient will require inpatient hospital care spanning beyond 2 midnights from the point of admission due to high intensity of service, high risk for further  deterioration and high frequency of surveillance required.*       Date of Service 08/22/2022    [facility]   If 7PM-7AM, please contact night-coverage [email or web address] 08/22/2022, 5:56  PM

## 2022-08-23 ENCOUNTER — Inpatient Hospital Stay: Payer: No Typology Code available for payment source

## 2022-08-23 DIAGNOSIS — E875 Hyperkalemia: Secondary | ICD-10-CM

## 2022-08-23 DIAGNOSIS — I25118 Atherosclerotic heart disease of native coronary artery with other forms of angina pectoris: Secondary | ICD-10-CM

## 2022-08-23 DIAGNOSIS — I4819 Other persistent atrial fibrillation: Secondary | ICD-10-CM

## 2022-08-23 DIAGNOSIS — L03115 Cellulitis of right lower limb: Secondary | ICD-10-CM | POA: Diagnosis not present

## 2022-08-23 DIAGNOSIS — I5043 Acute on chronic combined systolic (congestive) and diastolic (congestive) heart failure: Secondary | ICD-10-CM | POA: Diagnosis not present

## 2022-08-23 DIAGNOSIS — I2510 Atherosclerotic heart disease of native coronary artery without angina pectoris: Secondary | ICD-10-CM | POA: Diagnosis not present

## 2022-08-23 DIAGNOSIS — N189 Chronic kidney disease, unspecified: Secondary | ICD-10-CM

## 2022-08-23 DIAGNOSIS — I350 Nonrheumatic aortic (valve) stenosis: Secondary | ICD-10-CM

## 2022-08-23 DIAGNOSIS — I739 Peripheral vascular disease, unspecified: Secondary | ICD-10-CM

## 2022-08-23 LAB — CBC WITH DIFFERENTIAL/PLATELET
Abs Immature Granulocytes: 0.07 10*3/uL (ref 0.00–0.07)
Basophils Absolute: 0.1 10*3/uL (ref 0.0–0.1)
Basophils Relative: 1 %
Eosinophils Absolute: 0.2 10*3/uL (ref 0.0–0.5)
Eosinophils Relative: 2 %
HCT: 33.7 % — ABNORMAL LOW (ref 39.0–52.0)
Hemoglobin: 10 g/dL — ABNORMAL LOW (ref 13.0–17.0)
Immature Granulocytes: 1 %
Lymphocytes Relative: 16 %
Lymphs Abs: 1.6 10*3/uL (ref 0.7–4.0)
MCH: 29.2 pg (ref 26.0–34.0)
MCHC: 29.7 g/dL — ABNORMAL LOW (ref 30.0–36.0)
MCV: 98.3 fL (ref 80.0–100.0)
Monocytes Absolute: 1 10*3/uL (ref 0.1–1.0)
Monocytes Relative: 9 %
Neutro Abs: 7.4 10*3/uL (ref 1.7–7.7)
Neutrophils Relative %: 71 %
Platelets: 208 10*3/uL (ref 150–400)
RBC: 3.43 MIL/uL — ABNORMAL LOW (ref 4.22–5.81)
RDW: 21 % — ABNORMAL HIGH (ref 11.5–15.5)
WBC: 10.3 10*3/uL (ref 4.0–10.5)
nRBC: 0 % (ref 0.0–0.2)

## 2022-08-23 LAB — C-REACTIVE PROTEIN: CRP: 1.9 mg/dL — ABNORMAL HIGH (ref <1.0)

## 2022-08-23 LAB — GLUCOSE, CAPILLARY
Glucose-Capillary: 199 mg/dL — ABNORMAL HIGH (ref 70–99)
Glucose-Capillary: 213 mg/dL — ABNORMAL HIGH (ref 70–99)

## 2022-08-23 LAB — TROPONIN I (HIGH SENSITIVITY): Troponin I (High Sensitivity): 35 ng/L — ABNORMAL HIGH (ref <18)

## 2022-08-23 LAB — BASIC METABOLIC PANEL
Anion gap: 9 (ref 5–15)
BUN: 67 mg/dL — ABNORMAL HIGH (ref 8–23)
CO2: 22 mmol/L (ref 22–32)
Calcium: 8.6 mg/dL — ABNORMAL LOW (ref 8.9–10.3)
Chloride: 107 mmol/L (ref 98–111)
Creatinine, Ser: 2.27 mg/dL — ABNORMAL HIGH (ref 0.61–1.24)
GFR, Estimated: 28 mL/min — ABNORMAL LOW (ref >60)
Glucose, Bld: 160 mg/dL — ABNORMAL HIGH (ref 70–99)
Potassium: 5.3 mmol/L — ABNORMAL HIGH (ref 3.5–5.1)
Sodium: 138 mmol/L (ref 135–145)

## 2022-08-23 LAB — CBG MONITORING, ED: Glucose-Capillary: 115 mg/dL — ABNORMAL HIGH (ref 70–99)

## 2022-08-23 LAB — MAGNESIUM: Magnesium: 2.8 mg/dL — ABNORMAL HIGH (ref 1.7–2.4)

## 2022-08-23 MED ORDER — SODIUM ZIRCONIUM CYCLOSILICATE 10 G PO PACK
10.0000 g | PACK | Freq: Once | ORAL | Status: AC
  Administered 2022-08-23: 10 g via ORAL
  Filled 2022-08-23: qty 1

## 2022-08-23 NOTE — Consult Note (Signed)
Cardiology Consultation   Patient ID: Bradley [name] MRN: [id number]; DOB: 1940/12/24  Admit date: 08/22/2022 Date of Consult: 08/23/2022  PCP:  System, Provider Not In   [facility] Providers Cardiologist:  Julien Nordmann, MD   {   Patient Profile:   Bradley [name] is a 81 y.o. male with a hx of HFpEF, CAD s/p CABGx 3V in 2016, Afib, CKD stage 3, DM2, HLD, and HTN who is being seen 08/23/2022 for the evaluation of acute heart failure at the request of Dr. Marland Mcalpine.  History of Present Illness:   [name] [place] and was seen in our office in April 2023. In 2016 he had a CABG x3 at [facility] for his CAD. In November 2022 he underwent a LHC at [facility] that showed RPDA distal to SVG and anastomosis occlusion and was medically managed. InJanuary 2023 he was hospitalized at [facility] for chest pain and dyspnea where he underwent unsuccessful PCI attempt of distal SVG to PL lesion. In March 2023 he successfully underwent a cardioversion for his afib at the [place]. He takes Eliqius.   He was admitted in July 2023 with a hip fracture. He was seen by cardiology for cardiac evaluation. He underwent reduction and internal fixation 7/18 by ortho. BNP was mildly elevated and he was given lasix. HE remained in SB. Echo in July showed LVEF 40-45%, moderate LVH, WMA, G2DD, severe HK of the left wall, mildly reduced RVSF.  HE was admitted at [facility] 12/6-12/15/23 for Acute on chronic HFrEF, chest pain and mod-severe AS. He remained in SR. He was diuresed. I was felt TAVR was not indicated. He underwetn a cardiac cath showing known native 3VD, patient LIMA-LAD, patent free RIMA-OM3, SVG-RPL, unchanged from prior. Echo showed mild LV dysfunction, mild LVH, mild RV dysfunction, moderate AS. Recommended low-dose dobutamine echo or CT AV calcium score to evaluate severity of AS. Echo stress test  showing moderate AS and severe AS, no change in LV function/stroke volume with dobutamine, minimal  increase in mean/peak gradient with dobutamine, mean gradient increased from 18 to .  IT was felt disease was moderate and it was not felt TAVR was indicated at the time. Eliquis was reduced to 2.5 due to age and kidney function. Plavix was stopped, metoprolol was increased to 50mg  daily and spironolactone was decreased to 12.5mg  daily. He was discharged home on lasix 40mg  daily.   The patient presented to the ER at [facility] 08/22/22 with concerns for lower leg edema and celluitis. He was seen at the [facility] and sent over to the ER. He reported shortness of breath and lower leg edema for the last few days. He also noted worsening redness on his legs. He denies chest pain. He reports compliance with medications.  In the ER BP 121/54, pulse 50bpm, RR 16, afebrile, 100% O2. CXR showed low lung volumes with trace b/l effusions. DVT study negative. Labs showed K5.7, Scr 2.84, BUN 73, WBC 11.1, Hgb 10.6. BNP>45000.Mag 3.1.HS trop 38>35. EKG showed SB. The patient was given IV lasix and admitted for further work-up.  Past Medical History:  Diagnosis Date   (HFpEF) heart failure with preserved ejection fraction (HCC)    Coronary artery disease    Diabetes mellitus without complication (HCC)    Hyperlipidemia    Hypertension     Past Surgical History:  Procedure Laterality Date   CATARACT EXTRACTION     CHOLECYSTECTOMY     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  [facility]: LIMA-LAD, SVG-OM1, and SVG-rPDA   INTRAMEDULLARY (IM) NAIL INTERTROCHANTERIC Right 03/12/2022   Procedure: INTRAMEDULLARY (IM) NAIL INTERTROCHANTRIC;  Surgeon: Christena Flake, MD;  Location: [facility] ORS;  Service: Orthopedics;  Laterality: Right;   LEFT HEART CATH AND CORS/GRAFTS ANGIOGRAPHY N/A 07/10/2021   Procedure: LEFT HEART CATH AND CORS/GRAFTS ANGIOGRAPHY;  Surgeon: Yvonne Kendall, MD;  Location: [facility] INVASIVE CV LAB;  Service: Cardiovascular;  Laterality: N/A;     Home Medications:  Prior to Admission medications   Medication  Sig Start Date End Date Taking? Authorizing Provider  acetaminophen (TYLENOL) 325 MG tablet Take 650-1,300 mg by mouth 2 (two) times daily as needed.    [provider]  amiodarone (PACERONE) 200 MG tablet Take 100 mg by mouth daily.    [provider]  apixaban (ELIQUIS) 5 MG TABS tablet Take 1 tablet (5 mg total) by mouth 2 (two) times daily. Patient taking differently: Take 2.5 mg by mouth 2 (two) times daily. 08/06/21   Alford Highland, MD  atorvastatin (LIPITOR) 80 MG tablet TAKE ONE TABLET BY MOUTH AT BEDTIME FOR CHOLESTEROL 09/19/21   [provider]  Brinzolamide-Brimonidine 1-0.2 % SUSP Place 1 drop into the left eye 3 (three) times daily.    [provider]  carboxymethylcellulose 1 % ophthalmic solution Apply 1 drop to eye at bedtime.    [provider]  clopidogrel (PLAVIX) 75 MG tablet Take 1 tablet (75 mg total) by mouth daily with breakfast. Patient not taking: Reported on 08/22/2022 07/12/21   Esaw Grandchild A, DO  empagliflozin (JARDIANCE) 25 MG TABS tablet Take 12.5 mg by mouth every morning.    [provider]  furosemide (LASIX) 40 MG tablet Take 1 tablet (40 mg total) by mouth daily. 07/12/21   Esaw Grandchild A, DO  glipiZIDE (GLUCOTROL) 5 MG tablet Take by mouth.    [provider]  HYDROcodone-acetaminophen (NORCO/VICODIN) 5-325 MG tablet Take 1 tablet by mouth every 6 (six) hours as needed for moderate pain. 03/14/22   Wouk, Wilfred Curtis, MD  isosorbide mononitrate (IMDUR) 60 MG 24 hr tablet Take 1 tablet (60 mg total) by mouth daily. 08/07/21   Alford Highland, MD  metoprolol succinate (TOPROL-XL) 50 MG 24 hr tablet Take by mouth. 09/19/21 09/19/22  [provider]  nitroGLYCERIN (NITROSTAT) 0.4 MG SL tablet Place 1 tablet (0.4 mg total) under the tongue every 5 (five) minutes x 3 doses as needed for chest pain. 07/11/21   Esaw Grandchild A, DO  pantoprazole (PROTONIX) 40 MG tablet Take 40 mg by mouth  daily.    [provider]  polyethylene glycol (MIRALAX / GLYCOLAX) 17 g packet Take 17 g by mouth daily. 03/15/22   Wouk, Wilfred Curtis, MD  Propylene Glycol 0.6 % SOLN Place 1 drop into both eyes 4 (four) times daily.    [provider]  sacubitril-valsartan (ENTRESTO) 24-26 MG Take 1 tablet by mouth every 12 (twelve) hours. 09/18/21   [provider]  spironolactone (ALDACTONE) 25 MG tablet Take 1 tablet by mouth daily. 10/18/21   [provider]  timolol (TIMOPTIC-XR) 0.5 % ophthalmic gel-forming Place 1 drop into both eyes daily. 10/29/20   [provider]  travoprost, benzalkonium, (TRAVATAN) 0.004 % ophthalmic solution Place 1 drop into both eyes nightly.    [provider]  vitamin B-12 (CYANOCOBALAMIN) 500 MCG tablet Take 1,000 mcg by mouth daily. Before breakfast    [provider]    Inpatient Medications: Scheduled Meds:  amiodarone  100  mg Oral Daily   apixaban  2.5 mg Oral BID   atorvastatin  80 mg Oral QHS   brinzolamide  1 drop Both Eyes TID   And   brimonidine  1 drop Both Eyes TID   cyanocobalamin  1,000 mcg Oral Daily   furosemide  40 mg Intravenous Q12H   insulin aspart  0-5 Units Subcutaneous QHS   insulin aspart  0-9 Units Subcutaneous TID WC   isosorbide mononitrate  90 mg Oral Daily   metoprolol succinate  50 mg Oral Daily   pantoprazole  40 mg Oral Daily   sodium zirconium cyclosilicate  10 g Oral Once   timolol  1 drop Both Eyes QHS   Travoprost (BAK Free)  1 drop Both Eyes QHS   Continuous Infusions:  cefTRIAXone (ROCEPHIN)  IV     PRN Meds: albuterol, dextromethorphan-guaiFENesin, HYDROcodone-acetaminophen, nitroGLYCERIN, polyethylene glycol  Allergies:    Allergies  Allergen Reactions   Simvastatin Other (See Comments)    Per [facility] records Per [facility] records  Per [facility] records Per [facility] records    Social History:   Social History   Socioeconomic History   Marital status: Widowed     Spouse name: Not on file   Number of children: Not on file   Years of education: Not on file   Highest education level: Not on file  Occupational History   Not on file  Tobacco Use   Smoking status: Former   Smokeless tobacco: Never  Vaping Use   Vaping Use: Never used  Substance and Sexual Activity   Alcohol use: Never   Drug use: Never   Sexual activity: Not on file  Other Topics Concern   Not on file  Social History Narrative   Not on file   Social Determinants of Health   Financial Resource Strain: Not on file  Food Insecurity: Not on file  Transportation Needs: Not on file  Physical Activity: Not on file  Stress: Not on file  Social Connections: Not on file  Intimate Partner Violence: Not on file    Family History:    Family History  Problem Relation Age of Onset   CAD Mother    Urinary tract infection Father    Hip fracture Father    Cancer Sister    Cancer Brother      ROS:  Please see the history of present illness.   All other ROS reviewed and negative.     Physical Exam/Data:   Vitals:   08/23/22 0500 08/23/22 0515 08/23/22 0530 08/23/22 0535  BP:      Pulse: (!) 55 (!) 57 (!) 56   Resp: Temp:    98 F (36.7 C)  TempSrc:    Oral  SpO2: 98% 99% 100%   Weight:      Height:        Intake/Output Summary (Last 24 hours) at 08/23/2022 1020 Last data filed at 08/23/2022 0819 Gross per 24 hour  Intake 100 ml  Output 1550 ml  Net -1450 ml      08/22/2022    2:27 PM 08/22/2022   11:34 AM 06/10/2022    2:10 PM  Last 3 Weights  Weight (lbs) 188 lb 15 oz 189 lb 179 lb 4 oz  Weight (kg) 85.7 kg 85.73 kg 81.307 kg     Body mass index is 25.62 kg/m.  General:  Well nourished, well developed, in no acute distress HEENT: normal Neck: no JVD Vascular:  No carotid bruits; Distal pulses 2+ bilaterally Cardiac:  normal S1, S2; RRR; + murmur  Lungs:  clear to auscultation bilaterally, no wheezing, rhonchi or rales  Abd: soft,  nontender, no hepatomegaly  Ext: 1+ lower leg edema with wraps/celluitis Musculoskeletal:  No deformities, BUE and BLE strength normal and equal Skin: warm and dry  Neuro:  CNs 2-12 intact, no focal abnormalities noted Psych:  Normal affect   EKG:  The EKG was personally reviewed and demonstrates:  50bpm, RAD, ST/T wave changes inferolateral leads Telemetry:  Telemetry was personally reviewed and demonstrates:  SB  Relevant CV Studies:  Cardiac cath 08/04/22 1. Known native 3VD (RCA not injected, known CTO)  2. Patent LIMA-LAD  3. Patent free RIMA-OM3  4. SVG-RPL with distal 99% lesion on hairpin turn adjacent to distal anastomosis (unchanged compared with Jan 2023 cath)   Recommendations:   1. D/C left radial TR band per protocol  2. Unchanged coronary anatomy compared with Jan 2023 cath.  The SVG-RPL lesion was attempted previously unsuccessfully.  Given new symptoms, unlikely that this is a culprit for current CP.  3. Continue work-up/management for LFLG aortic stenosis.   Echo 08/05/22 INTERPRETATION ---------------------------------------------------------------    INDETERMINATE.   VALVULAR REGURGITATION: MODERATE AR   VALVULAR STENOSIS: SEVERE AS   Note: LOW-DOSE DOBUTAMINE TO ASSESS AORTIC VALVE GRADIENTS   REST: Peak Vel=2.50m/sec; peak, mean; V2 = 55cm; V1 = 17cm;   LVOT Vel=0.32m/sec;  DI = 0.31; AVA=1.0cm2; SVI = 26.73ml/m2    : Peak Vel=2.74m/sec; peak, mean; V2 = 59cm; V1 =   12cm;LVOT Vel= 0.10m/sec;  DI = 0.20; AVA=0.6cm2; SVI = 18.19ml/m2    : Peak Vel=2.36m/sec; peak, mean; V2 = 59cm; V1 =   18cm;LVOT Vel= 0.7m/sec;  DI = 0.30; AVA=1.0cm2; SVI = 27.3ml/m2    : Peak Vel=2.78m/sec; 35.30mmHg peak, mean; V2 = 55cm; V1 =   11cm;LVOT Vel= 0.99m/sec;  DI = 0.20; AVA=0.6cm2; SVI = 17.24ml/m2    : Peak Vel=3.44m/sec; peak, mean; V2 = 54cm; V1 =   12cm;LVOT Vel= 0.85m/sec;  DI = 0.22;  AVA=0.7cm2; SVI = 18.44ml/m2    MILD TO MODERATE AORTIC REGURGITATION AT REST    NO CHANGE IN LV FUNCTION/STROKE VOLUME WITH DOBUTAMINE   MINIMAL INCREASE IN MEAN/PEAK GRADIENT WITH DOBUTAMINE; MEAN GRADIENT   INCREASED FROM 19 TO   Maximum workload of  1.00 METs was achieved during exercise.   NORMAL RESTING BP - BLUNTED RESPONSE   Echocardiogram completed on 03/12/2022 1. Left ventricular ejection fraction, by estimation, is 40 to 45%. The  left ventricle has mildly decreased function. The left ventricle  demonstrates regional wall motion abnormalities (see scoring  diagram/findings for description). There is moderate  left ventricular hypertrophy. Left ventricular diastolic parameters are  consistent with Grade II diastolic dysfunction (pseudonormalization).  Elevated left atrial pressure. There is severe hypokinesis of the left  ventricular, basal-mid inferoseptal wall  and inferior wall.   2. Right ventricular systolic function is mildly reduced. The right  ventricular size is normal.   3. Left atrial size was mildly dilated.   4. Right atrial size was mildly dilated.   5. The mitral valve is degenerative. Mild mitral valve regurgitation.   6. The aortic valve is tricuspid. There is moderate calcification of the  aortic valve. There is moderate thickening of the aortic valve. Aortic  valve regurgitation is mild. 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  While the mean gradient is  only mildly   elevated, the abnormal valve area and dimensionless index calculations  suggest at least moderate to severe low-flow:low-gradient aortic stenosis.  Visual assessment of leaflet movement suggests that aortic stenosis is not  critical.   7. The inferior vena cava is normal in size with <50% respiratory  variability, suggesting right atrial pressure of 8 mmHg.    Laboratory Data:  High Sensitivity Troponin:   Recent Labs  Lab 08/22/22 1747 08/23/22 0354  TROPONINIHS 38* 35*      Chemistry Recent Labs  Lab 08/22/22 1145 08/23/22 0354  NA 138 138  K 5.7* 5.3*  CL 106 107  CO2 24 22  GLUCOSE 241* 160*  BUN 73* 67*  CREATININE 2.84* 2.27*  CALCIUM 8.5* 8.6*  MG 3.1* 2.8*  GFRNONAA 22* 28*  ANIONGAP 8 9    No results for input(s): "PROT", "ALBUMIN", "AST", "ALT", "ALKPHOS", "BILITOT" in the last 168 hours. Lipids  Recent Labs  Lab 08/22/22 1605  CHOL 75  TRIG 125  HDL 33*  LDLCALC 17  CHOLHDL 2.3    Hematology Recent Labs  Lab 08/22/22 1145  WBC 11.1*  RBC 3.70*  HGB 10.6*  HCT 36.5*  MCV 98.6  MCH 28.6  MCHC 29.0*  RDW 20.9*  PLT 260   Thyroid No results for input(s): "TSH", "FREET4" in the last 168 hours.  BNP Recent Labs  Lab 08/22/22 1145  BNP >4,500.0*    DDimer No results for input(s): "DDIMER" in the last 168 hours.   Radiology/Studies:  US ARTERIAL ABI (SCREENING LOWER EXTREMITY)  Result Date: 08/23/2022 CLINICAL DATA:  Peripheral vascular disease. Right lower extremity rest pain. EXAM: NONINVASIVE PHYSIOLOGIC VASCULAR STUDY OF BILATERAL LOWER EXTREMITIES TECHNIQUE: Evaluation of both lower extremities were performed at rest, including calculation of ankle-brachial indices with single level Doppler, pressure and pulse volume recording. COMPARISON:  None Available. FINDINGS: Right ABI:  0.58 Left ABI:  1.04 Right Lower Extremity:  Irregular waveforms in the right ankle. Left Lower Extremity:  Irregular waveforms in the left ankle. 0.5-0.79 Moderate PAD IMPRESSION: 1. Right ABI is 0.58 and suggestive for moderate peripheral arterial disease. 2. Normal left ABI, measuring 1.04. Electronically Signed   By: Richarda Overlie M.D.   On: 08/23/2022 10:02   US Venous Img Lower Bilateral (DVT)  Result Date: 08/22/2022 CLINICAL DATA:  Cellulitis EXAM: BILATERAL LOWER EXTREMITY VENOUS DOPPLER ULTRASOUND TECHNIQUE: Gray-scale sonography with compression, as well as color and duplex ultrasound, were performed to evaluate the deep venous  system(s) from the level of the common femoral vein through the popliteal and proximal calf veins. COMPARISON:  None Available. FINDINGS: VENOUS Normal compressibility of the common femoral, superficial femoral, and popliteal veins, as well as the visualized calf veins. Visualized portions of profunda femoral vein and great saphenous vein unremarkable. No filling defects to suggest DVT on grayscale or color Doppler imaging. Doppler waveforms show normal direction of venous flow, normal respiratory plasticity and response to augmentation. OTHER None. Limitations: none IMPRESSION: Negative. Electronically Signed   By: Charlett Nose M.D.   On: 08/22/2022 19:08   DG Chest 2 View  Result Date: 08/22/2022 CLINICAL DATA:  History of congestive heart failure with dyspnea on exertion and bilateral leg edema EXAM: CHEST - 2 VIEW COMPARISON:  Chest radiograph dated 03/12/2022 FINDINGS: Low lung volumes. No focal consolidations. Trace bilateral pleural effusions. No pneumothorax. Similar postsurgical cardiomediastinal silhouette. Median sternotomy wires are nondisplaced. Surgical clips project over the mediastinum. IMPRESSION: Low lung volumes with trace bilateral pleural effusions.  No focal consolidations. Electronically Signed   By: Agustin CreeLimin  Xu M.D.   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 - patient presented wit lower leg edema, suspected cellulitis and SOB found to have BNP>4500 - second admission this month for heart failure - Echo from November 2022 showed LVEF 30-35% - Echo July 2023 showed LVEF 40-45% - Echo 08/02/22 showed mild LV dysfunction, mild LVH, moderate AS - he has mod to severe AS, which may be contributing to symptoms - he reports compliance with lasix 40mg  daily at home - PTA spironolactone 12.5mg  daily, Jardiance 25mg  daily, Entresto 24-26mg BID, and Toprol 50mg  daily - In the ER he was given IV lasix 80mg  in the and started  on IV lasix 40mg  BID - Net -1.4L - he reports lower leg edema is improving - continue to monitor daily weights, strict I/Os, and kidney function  Cellulitis of lower extremity - abx per IM - DVT study negative  Moderate to Severe AS - Echo from July showed LVEF 40-45%, mod LVH, G2DD, mild MR, mod to severe AS, low-flow low gradient aortic stenosis - [facility] LV dysfunction with mild LVH, mild RV systolic dysfunction, moderate AS and mild to mod AS, gradient increased from 19-3724mmHg.  Recommended low-dose dobutamine to assess aortic valve gradients. IT was disease was moderate and TAVR was not indicated at the time. May need to revisit this  CAD s/p CABG and prior PCI - [facility] - HS trop elevated with flat trend - no chest pain reported - continue Lipitor 80mg  daily, Toprol 50mg  daily, Imdur 60mg  daily - no further ischemic work-up at this time  HTN - BP is good, continue current medications  Acute on chronic renal failure CKD stage 3 - Scr 1.3-1.5 at baseline - [facility] 1.8-1.9 - on this admission Scr 2.84, BUN 73 - improving with diuresis  HLD - continue statin therapy  Persistent Afib s/p prior cardioversion - remains in SB - continue amiodarone 100mg  daily - Toprol 50mg  daily  Hyperkalemia - s/p lokelma  For questions or updates, please contact [facility] Please consult [email or web address] for contact info under    Signed, Cleland Simkins David StallH Ayelen Sciortino, PA-C  08/23/2022 10:20 AM

## 2022-08-23 NOTE — Progress Notes (Signed)
PROGRESS NOTE    Bradley [name]  AJG:[id number] DOB: 09-11-1940 DOA: 08/22/2022 PCP: System, Provider Not In   Brief Narrative:  HPI per Dr. Ivor Costa on 08/22/22 Bradley [name] is a 81 y.o. 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   Patient states that he has chronic shortness of breath, which has been progressively worsening in the past several weeks, much worse today.  Patient does not have cough, fever or chills.  Patient has intermittent chest pain, which is located in the front chest, intermittent, exertional, mild, pressure-like, nonradiating.  Currently no active chest pain.  Patient does not have nausea vomiting, diarrhea or abdominal pain.  No symptoms of UTI. He has worsening bilateral leg edema. Both legs are oozing fluid. He has multiple skin tear in legs. According to the patient he was sent from the [place] today to our hospital with concerns for leg cellulitis.   Data reviewed independently and ED Course: pt was found to have BNP > 4500,  WBC 11.1, potassium 5.7, worsening renal function, temperature normal, blood pressure 121/54, heart rate 50, RR 16, oxygen saturation 100% on room air.  Chest x-ray showed low volume without infiltration.  Patient is admitted to telemetry bed as inpatient  **Interim History  Given that he remained volume overloaded cardiology was consulted for further evaluation recommendations  Assessment and Plan:  Acute on chronic combined systolic and diastolic CHF (congestive heart failure) ([place]) -2D echo on 03/12/2022 showed EF of 40-45% with grade 2 diastolic dysfunction.  BNP> 4500. 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 -Lipitor, Plavix, imdur,  -Trend troponin -check A1 and FLP   Essential Hypertension -IV hydralazine as needed -Metoprolol -Patient is on IV Lasix -Continue monitor blood pressures per protocol  Moderate aortic stenosis -Recent echo done showed moderate aortic stenosis at [place] -TAVR is not indicated at this time and cardiology is following   Acute renal failure superimposed on stage 3a chronic kidney disease ([place]):  -Baseline creatinine 1.38 on 03/14/2022.   -BUN/Cr Trend: Recent Labs  Lab 08/22/22 1145 08/23/22 0354  BUN 73* 67*  CREATININE 2.84* 2.27*  -Hold Entresto -Avoid Nephrotoxic Medications, Contrast Dyes, Hypotension and Dehydration to Ensure Adequate Renal Perfusion and will need to Renally Adjust Meds -Continue to Monitor and Trend Renal Function carefully and repeat CMP in the AM    HLD (hyperlipidemia) -C/w Statin Therapy with Atorvastatin  -Lipid Panel done and showed a total cholesterol/HDL ratio of 2.3, cholesterol level 175, HDL 33, LDL 17, triglycerides 125,  VLDL 25   Type II diabetes mellitus with renal manifestations [facility]): Recent A1c 7.8.   -Poorly controlled.  Patient taking glipizide and Jardiance at home which will be  held -Sliding scale insulin   Paroxysmal atrial fibrillation ([place]) s/p Cardioversion -Eliquis -Continue to Monitor on Telemetr and c/w Amiodarone 100 mg po daily and Metoprolol Succinate 50 mg po Daily    Hyperkalemia:  -Potassium 5.7 and is now 5.3 -10 g of Lokelma given again Recent Labs  Lab 08/22/22 1145 08/23/22 0354  K 5.7* 5.3*  -Continue to Monitor and Trend and repeat CMP in the AM   PVD (peripheral vascular disease) ([place]) -Follow-up ABI and showed "Right ABI is 0.58 and suggestive for moderate peripheral arterial disease. Normal left ABI, measuring 1.04" -Patient is on Plavix and Lipitor  Normocytic Anemia -Hgb/Hct Trend: Recent Labs  Lab 08/22/22 1145 08/23/22 1138  HGB 10.6* 10.0*  HCT 36.5* 33.7*  MCV 98.6 98.3  -Check Anemia Panel In the AM -Continue to Monitor for S/Sx of Bleeding; No overt bleeding noted -Repeat CBC in the AM    DVT prophylaxis: apixaban (ELIQUIS) tablet 2.5 mg Start: 08/22/22 2200 apixaban (ELIQUIS) tablet 2.5 mg    Code Status: DNR Family Communication: Discussed with family present at bedside   Disposition Plan:  Level of care: Telemetry Cardiac Status is: Inpatient Remains inpatient appropriate because: Needs further clinical improvement   Consultants:  Cardiology  Procedures:  As delineated as above  Antimicrobials:  Anti-infectives (From admission, onward)    Start     Dose/Rate Route Frequency Ordered Stop   08/23/22 1600  cefTRIAXone (ROCEPHIN) 1 g in sodium chloride 0.9 % 100 mL IVPB        1 g 200 mL/hr over 30 Minutes Intravenous Every 24 hours 08/22/22 1752 08/29/22 1559   08/22/22 1545  cefTRIAXone (ROCEPHIN) 1 g in sodium chloride 0.9 % 100 mL IVPB        1 g 200 mL/hr over 30 Minutes Intravenous  Once 08/22/22 1531 08/22/22 1746       Subjective: Seen and examined at bedside and he thinks he is doing a bit better.  Leg swelling is improving.  Patient thinks he is doing better.  No nausea or vomiting.  No  lightheadedness or dizziness.  No other concerns or concerns at this time.  Objective: Vitals:   08/23/22 0800 08/23/22 1042 08/23/22 1100 08/23/22 1533  BP: 120/62 122/75 128/78 121/78  Pulse: 62 62  64  Resp: _0 Temp: 98.2 F (36.8 C) 98.6 F (37 C) 98.5 F (36.9 C) 98.6 F (37 C)  TempSrc: Oral Oral  Oral  SpO2: 100% 99% 100% 100%  Weight:      Height:        Intake/Output Summary (Last 24 hours) at 08/23/2022 1800 Last data filed at 08/23/2022 1534 Gross per 24 hour  Intake 240 ml  Output 2000 ml  Net -1760 ml   Filed Weights   08/22/22 1134 08/22/22 1427  Weight: 85.7 kg 85.7 kg   Examination: Physical Exam:  Constitutional: WN/WD overweight elderly Caucasian male in NAD Respiratory: Diminished to auscultation bilaterally, no wheezing, rales, rhonchi or crackles. Normal respiratory effort and patient is not tachypenic. No accessory muscle use. Unlabored breathing  Cardiovascular: RRR, no murmurs / rubs / gallops. S1 and S2 auscultated.  Abdomen: Soft, non-tender, Distended 2/2 body habitus. Bowel sounds positive.  GU: Deferred. Musculoskeletal: No clubbing / cyanosis of digits/nails. No joint deformity upper and  lower extremities. .  Skin: No rashes, lesions, Leg Ulcers noted.  Neurologic: CN 2-12 grossly intact with no focal deficits. . Romberg sign cerebellar reflexes not assessed.  Psychiatric: Normal judgment and insight. Alert and oriented x 3. Normal mood and appropriate affect.   Data Reviewed: I have personally reviewed following labs and imaging studies  CBC: Recent Labs  Lab 08/22/22 1145 08/23/22 1138  WBC 11.1* 10.3  NEUTROABS  --  7.4  HGB 10.6* 10.0*  HCT 36.5* 33.7*  MCV 98.6 98.3  PLT 260 272   Basic Metabolic Panel: Recent Labs  Lab 08/22/22 1145 08/23/22 0354  NA 138 138  K 5.7* 5.3*  CL 106 107  CO2 24 22  GLUCOSE 241* 160*  BUN 73* 67*  CREATININE 2.84* 2.27*  CALCIUM 8.5* 8.6*  MG 3.1* 2.8*   GFR: Estimated  Creatinine Clearance: 28 mL/min (A) (by C-G formula based on SCr of 2.27 mg/dL (H)). Liver Function Tests: No results for input(s): "AST", "ALT", "ALKPHOS", "BILITOT", "PROT", "ALBUMIN" in the last 168 hours. No results for input(s): "LIPASE", "AMYLASE" in the last 168 hours. No results for input(s): "AMMONIA" in the last 168 hours. Coagulation Profile: No results for input(s): "INR", "PROTIME" in the last 168 hours. Cardiac Enzymes: No results for input(s): "CKTOTAL", "CKMB", "CKMBINDEX", "TROPONINI" in the last 168 hours. BNP (last 3 results) No results for input(s): "PROBNP" in the last 8760 hours. HbA1C: No results for input(s): "HGBA1C" in the last 72 hours. CBG: Recent Labs  Lab 08/22/22 2256 08/23/22 0758 08/23/22 1319 08/23/22 1728  GLUCAP 174* 115* 199* 213*   Lipid Profile: Recent Labs    08/22/22 1605  CHOL 75  HDL 33*  LDLCALC 17  TRIG 125  CHOLHDL 2.3   Thyroid Function Tests: No results for input(s): "TSH", "T4TOTAL", "FREET4", "T3FREE", "THYROIDAB" in the last 72 hours. Anemia Panel: No results for input(s): "VITAMINB12", "FOLATE", "FERRITIN", "TIBC", "IRON", "RETICCTPCT" in the last 72 hours. Sepsis Labs: No results for input(s): "PROCALCITON", "LATICACIDVEN" in the last 168 hours.  Recent Results (from the past 240 hour(s))  Blood culture (routine x 2)     Status: None (Preliminary result)   Collection Time: 08/22/22  4:20 PM   Specimen: BLOOD  Result Value Ref Range Status   Specimen Description BLOOD BLOOD RIGHT WRIST  Final   Special Requests   Final    BOTTLES DRAWN AEROBIC AND ANAEROBIC Blood Culture adequate volume   Culture   Final    NO GROWTH < 12 HOURS Performed at [facility], [address] [place], [place] [address]    Report Status PENDING  Incomplete  Blood culture (routine x 2)     Status: None (Preliminary result)   Collection Time: 08/22/22  4:20 PM   Specimen: BLOOD  Result Value Ref Range Status   Specimen  Description BLOOD BLOOD RIGHT FOREARM  Final   Special Requests   Final    BOTTLES DRAWN AEROBIC AND ANAEROBIC Blood Culture adequate volume   Culture   Final    NO GROWTH < 12 HOURS Performed at [facility], [address], [place], [place] [address]    Report Status PENDING  Incomplete     Radiology Studies: US ARTERIAL ABI (SCREENING LOWER EXTREMITY)  Result Date: 08/23/2022 CLINICAL DATA:  Peripheral vascular disease. Right lower extremity rest pain. EXAM: NONINVASIVE PHYSIOLOGIC VASCULAR STUDY OF BILATERAL LOWER EXTREMITIES TECHNIQUE: Evaluation of both lower extremities were performed at rest, including calculation of ankle-brachial indices with single  level Doppler, pressure and pulse volume recording. COMPARISON:  None Available. FINDINGS: Right ABI:  0.58 Left ABI:  1.04 Right Lower Extremity:  Irregular waveforms in the right ankle. Left Lower Extremity:  Irregular waveforms in the left ankle. 0.5-0.79 Moderate PAD IMPRESSION: 1. Right ABI is 0.58 and suggestive for moderate peripheral arterial disease. 2. Normal left ABI, measuring 1.04. Electronically Signed   By: Markus Daft M.D.   On: 08/23/2022 10:02   US Venous Img Lower Bilateral (DVT)  Result Date: 08/22/2022 CLINICAL DATA:  Cellulitis EXAM: BILATERAL LOWER EXTREMITY VENOUS DOPPLER ULTRASOUND TECHNIQUE: Gray-scale sonography with compression, as well as color and duplex ultrasound, were performed to evaluate the deep venous system(s) from the level of the common femoral vein through the popliteal and proximal calf veins. COMPARISON:  None Available. FINDINGS: VENOUS Normal compressibility of the common femoral, superficial femoral, and popliteal veins, as well as the visualized calf veins. Visualized portions of profunda femoral vein and great saphenous vein unremarkable. No filling defects to suggest DVT on grayscale or color Doppler imaging. Doppler waveforms show normal direction of venous flow, normal respiratory  plasticity and response to augmentation. OTHER None. Limitations: none IMPRESSION: Negative. Electronically Signed   By: Rolm Baptise M.D.   On: 08/22/2022 19:08   DG Chest 2 View  Result Date: 08/22/2022 CLINICAL DATA:  History of congestive heart failure with dyspnea on exertion and bilateral leg edema EXAM: CHEST - 2 VIEW COMPARISON:  Chest radiograph dated 03/12/2022 FINDINGS: Low lung volumes. No focal consolidations. Trace bilateral pleural effusions. No pneumothorax. Similar postsurgical cardiomediastinal silhouette. Median sternotomy wires are nondisplaced. Surgical clips project over the mediastinum. IMPRESSION: Low lung volumes with trace bilateral pleural effusions. No focal consolidations. Electronically Signed   By: Darrin Nipper M.D.   On: 08/22/2022 12:12    Scheduled Meds:  amiodarone  100 mg Oral Daily   apixaban  2.5 mg Oral BID   atorvastatin  80 mg Oral QHS   brinzolamide  1 drop Both Eyes TID   And   brimonidine  1 drop Both Eyes TID   cyanocobalamin  1,000 mcg Oral Daily   furosemide  40 mg Intravenous Q12H   insulin aspart  0-5 Units Subcutaneous QHS   insulin aspart  0-9 Units Subcutaneous TID WC   isosorbide mononitrate  90 mg Oral Daily   metoprolol succinate  50 mg Oral Daily   pantoprazole  40 mg Oral Daily   timolol  1 drop Both Eyes QHS   Travoprost (BAK Free)  1 drop Both Eyes QHS   Continuous Infusions:  cefTRIAXone (ROCEPHIN)  IV      LOS: 1 day   Raiford Noble, DO [facility] Available via Epic secure chat 7am-7pm After these hours, please refer to coverage provider listed on [email or web address] 08/23/2022, 6:00 PM

## 2022-08-23 NOTE — TOC Initial Note (Signed)
Transition of Care [facility]) - Initial/Assessment Note    Patient Details  Name: Bradley [name] MRN: [id number] Date of Birth: Nov 07, 1940  [facility] CM/SW Contact:    Allayne Butcher, RN Phone Number: 08/23/2022, 9:49 AM  Clinical Narrative:                 Received a call from Selena Batten over at the [facility] transfer center.  The UC at the [facility] sent patient to the ED for CHF exacerbation.  [facility] he sees a Dr. Arlester Marker.  Alveda Reasons is the SW with the [facility] for needed services- [phone].    Expected Discharge Plan: Home w Home Health Services Barriers to Discharge: Continued Medical Work up   Patient Goals and CMS Choice            Expected Discharge Plan and Services   Discharge Planning Services: CM Consult   Living arrangements for the past 2 months: Single Family Home                                      Prior Living Arrangements/Services Living arrangements for the past 2 months: Single Family Home            Need for Family Participation in Patient Care: Yes (Comment) Care giver support system in place?: Yes (comment)      Activities of Daily Living      Permission Sought/Granted                  Emotional Assessment         Alcohol / Substance Use: Not Applicable Psych Involvement: No (comment)  Admission diagnosis:  Acute on chronic combined systolic and diastolic CHF (congestive heart failure) (HCC) [I50.43] Patient Active Problem List   Diagnosis Date Noted   Acute on chronic combined systolic and diastolic CHF (congestive heart failure) (HCC) 08/22/2022   Hyperkalemia 08/22/2022   Acute renal failure superimposed on stage 3a chronic kidney disease (HCC) 08/22/2022   HLD (hyperlipidemia) 08/22/2022   Type II diabetes mellitus with renal manifestations (HCC) 08/22/2022   Cellulitis of lower extremity 08/22/2022   PVD (peripheral vascular disease) (HCC) 08/22/2022    Atrial fibrillation (HCC)    Closed intertrochanteric fracture of hip, right, initial encounter (HCC)    Hip fx (HCC) 03/12/2022   Persistent atrial fibrillation (HCC) 10/30/2021   Unstable angina (HCC) 08/05/2021   Paroxysmal atrial fibrillation (HCC) 08/05/2021   CAD (coronary artery disease) 08/05/2021   Pulmonary edema 08/05/2021   Acute systolic CHF (congestive heart failure) (HCC) 08/05/2021   Angina pectoris (HCC) 08/05/2021   CKD stage 3 due to type 2 diabetes mellitus (HCC)    AF (paroxysmal atrial fibrillation) (HCC)    Chronic HFrEF (heart failure with reduced ejection fraction) (HCC)    Hyperlipidemia    Glaucoma of both eyes    Ischemic cardiomyopathy    Chronic low back pain 07/09/2021   NSTEMI (non-ST elevated myocardial infarction) (HCC) 07/09/2021   Acute on chronic diastolic CHF (congestive heart failure) (HCC) 07/09/2021   Hyperglycemia due to type 2 diabetes mellitus (HCC) 07/09/2021   S/P CABG (coronary artery bypass graft) 07/09/2021   Essential hypertension 07/09/2021   Syncope 03/13/2018   PCP:  System, Provider Not In Pharmacy:   [id number] - [place], [place] - [address]  [place] [address] Phone: [phone] Fax: [phone]  [facility] - [place], [place] - [address] [place] [address] Phone: [phone] Fax: [phone]  [id number] - [place], [place] - [address] [place] [address] Phone: [phone] Fax: [phone]     Social Determinants of Health (SDOH) Social History: SDOH Screenings   Tobacco Use: Medium Risk (08/22/2022)   SDOH Interventions:     Readmission Risk Interventions     No data to display

## 2022-08-23 NOTE — Consult Note (Addendum)
WOC Nurse Consult Note: Reason for Consult:Consult requested for bilat legs.  Performed remotely after review of progress notes and photos in the EMR.   Pt has generalized edema and erythremia to bilat lower legs and is currently receiving systemic antibiotics for cellulitis.  He developed  a large amt yellow drainage and blistering with has evolved into patchy areas of red moist full thickness stasis ulcers, according to progress notes.  Dressing procedure/placement/frequency: Topical treatment orders provided for bedside nurses to perform as follows to promote drying and healing and reduce edema: Apply Xeroform gauze to bilat legs Q day, then cover with ABD pafds and then kerlex in a spiral fashion, beginning just behind the toes to below the knees.  Then apply ace wraps in the same manner.  Please re-consult if further assistance is needed.  Thank-you,  Cammie Mcgee MSN, RN, CWOCN, Flasher, CNS [phone]

## 2022-08-24 DIAGNOSIS — L03115 Cellulitis of right lower limb: Secondary | ICD-10-CM | POA: Diagnosis not present

## 2022-08-24 DIAGNOSIS — E875 Hyperkalemia: Secondary | ICD-10-CM | POA: Diagnosis not present

## 2022-08-24 DIAGNOSIS — I739 Peripheral vascular disease, unspecified: Secondary | ICD-10-CM | POA: Diagnosis not present

## 2022-08-24 DIAGNOSIS — I5043 Acute on chronic combined systolic (congestive) and diastolic (congestive) heart failure: Secondary | ICD-10-CM | POA: Diagnosis not present

## 2022-08-24 DIAGNOSIS — L03119 Cellulitis of unspecified part of limb: Secondary | ICD-10-CM | POA: Diagnosis not present

## 2022-08-24 LAB — COMPREHENSIVE METABOLIC PANEL
ALT: 33 U/L (ref 0–44)
AST: 16 U/L (ref 15–41)
Albumin: 3 g/dL — ABNORMAL LOW (ref 3.5–5.0)
Alkaline Phosphatase: 180 U/L — ABNORMAL HIGH (ref 38–126)
Anion gap: 8 (ref 5–15)
BUN: 58 mg/dL — ABNORMAL HIGH (ref 8–23)
CO2: 24 mmol/L (ref 22–32)
Calcium: 8.5 mg/dL — ABNORMAL LOW (ref 8.9–10.3)
Chloride: 106 mmol/L (ref 98–111)
Creatinine, Ser: 1.95 mg/dL — ABNORMAL HIGH (ref 0.61–1.24)
GFR, Estimated: 34 mL/min — ABNORMAL LOW (ref >60)
Glucose, Bld: 126 mg/dL — ABNORMAL HIGH (ref 70–99)
Potassium: 4.6 mmol/L (ref 3.5–5.1)
Sodium: 138 mmol/L (ref 135–145)
Total Bilirubin: 0.7 mg/dL (ref 0.3–1.2)
Total Protein: 6.1 g/dL — ABNORMAL LOW (ref 6.5–8.1)

## 2022-08-24 LAB — CBC WITH DIFFERENTIAL/PLATELET
Abs Immature Granulocytes: 0.04 10*3/uL (ref 0.00–0.07)
Basophils Absolute: 0.1 10*3/uL (ref 0.0–0.1)
Basophils Relative: 1 %
Eosinophils Absolute: 0.2 10*3/uL (ref 0.0–0.5)
Eosinophils Relative: 2 %
HCT: 33.7 % — ABNORMAL LOW (ref 39.0–52.0)
Hemoglobin: 10 g/dL — ABNORMAL LOW (ref 13.0–17.0)
Immature Granulocytes: 0 %
Lymphocytes Relative: 18 %
Lymphs Abs: 1.8 10*3/uL (ref 0.7–4.0)
MCH: 28.1 pg (ref 26.0–34.0)
MCHC: 29.7 g/dL — ABNORMAL LOW (ref 30.0–36.0)
MCV: 94.7 fL (ref 80.0–100.0)
Monocytes Absolute: 1 10*3/uL (ref 0.1–1.0)
Monocytes Relative: 10 %
Neutro Abs: 6.8 10*3/uL (ref 1.7–7.7)
Neutrophils Relative %: 69 %
Platelets: 247 10*3/uL (ref 150–400)
RBC: 3.56 MIL/uL — ABNORMAL LOW (ref 4.22–5.81)
RDW: 20.6 % — ABNORMAL HIGH (ref 11.5–15.5)
WBC: 10 10*3/uL (ref 4.0–10.5)
nRBC: 0 % (ref 0.0–0.2)

## 2022-08-24 LAB — GLUCOSE, CAPILLARY
Glucose-Capillary: 128 mg/dL — ABNORMAL HIGH (ref 70–99)
Glucose-Capillary: 106 mg/dL — ABNORMAL HIGH (ref 70–99)
Glucose-Capillary: 175 mg/dL — ABNORMAL HIGH (ref 70–99)
Glucose-Capillary: 176 mg/dL — ABNORMAL HIGH (ref 70–99)
Glucose-Capillary: 226 mg/dL — ABNORMAL HIGH (ref 70–99)

## 2022-08-24 LAB — HEMOGLOBIN A1C
Hgb A1c MFr Bld: 8.9 % — ABNORMAL HIGH (ref 4.8–5.6)
Mean Plasma Glucose: 209 mg/dL

## 2022-08-24 LAB — MAGNESIUM: Magnesium: 2.5 mg/dL — ABNORMAL HIGH (ref 1.7–2.4)

## 2022-08-24 LAB — PHOSPHORUS: Phosphorus: 4 mg/dL (ref 2.5–4.6)

## 2022-08-24 MED ORDER — SODIUM CHLORIDE 0.9 % IV SOLN: INTRAVENOUS | Status: DC | PRN

## 2022-08-24 MED ORDER — COLLAGENASE 250 UNIT/GM EX OINT
TOPICAL_OINTMENT | Freq: Every day | CUTANEOUS | Status: DC
  Administered 2022-09-14: 1 via TOPICAL
  Filled 2022-08-24 (×2): qty 30

## 2022-08-24 NOTE — Progress Notes (Incomplete)
PROGRESS NOTE    Bradley [name]  YIA:[id number] DOB: 1940/10/21 DOA: 08/22/2022 PCP: System, Provider Not In   Brief Narrative:  HPI per Dr. Ivor [name] on 08/22/22 Bradley [name] is a 81 y.o. 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   Patient states that he has chronic shortness of breath, which has been progressively worsening in the past several weeks, much worse today.  Patient does not have cough, fever or chills.  Patient has intermittent chest pain, which is located in the front chest, intermittent, exertional, mild, pressure-like, nonradiating.  Currently no active chest pain.  Patient does not have nausea vomiting, diarrhea or abdominal pain.  No symptoms of UTI. He has worsening bilateral leg edema. Both legs are oozing fluid. He has multiple skin tear in legs. According to the patient he was sent from the [place] today to our hospital with concerns for leg cellulitis.   Data reviewed independently and ED Course: pt was found to have BNP > 4500,  WBC 11.1, potassium 5.7, worsening renal function, temperature normal, blood pressure 121/54, heart rate 50, RR 16, oxygen saturation 100% on room air.  Chest x-ray showed low volume without infiltration.  Patient is admitted to telemetry bed as inpatient   **Interim History  Given that he remained volume overloaded cardiology was consulted for further evaluation recommendations.  Podiatry was consulted for his foot and vascular was also consulted.  He is improving with diuresis and still not back to his baseline but is definitely improving  Assessment and Plan:  Acute on chronic combined systolic and diastolic CHF (congestive heart failure) ([place]), improving -2D echo on 03/12/2022 showed EF of 40-45% with grade 2 diastolic dysfunction.  BNP> 4500. -Will admit to tele bed as  inpatient -Lasix 40 mg bid by IV (patient received 80 mg of Lasix in ED cardiology recommends continuing this -Cardiology has been consulted for further evaluation and recommending holding Entresto, spironolactone and Jardiance due to acute on chronic kidney disease and recommending continuing Toprol -Patient is -2.880 Liters since admission -Patient's  -Cardiology recommends that if his renal function returns to baseline then they will consider resuming Entresto and spironolactone SpO2: 100 % -Multiple echo was done and -Strict I's and O's and daily weights -Continue to monitor for signs and symptoms of volume overload -Cardiology consulted and appreciate further evaluation   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  Podiatry recommends a saline wet-to-dry gauze dressing applied to right foot as well as Santyl ointment and moistened gauze dressing to the ulceration of right foot and recommending obtaining vascular consultation for input as to patient's circulation given that he may not be a good candidate given his renal dysfunction -Vascular evaluated and they were able to palpate his left femoral pulse but not the right Try again tomorrow.  They feel that he will likely  require angiography to make sure he has adequate blood flow for wound healing and they recommended that the patient be given some IV hydration but this is difficult given his heart  failure. 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 -Lipitor, Plavix, imdur,  -Trend troponin -check A1 and FLP   Essential Hypertension -IV hydralazine as needed -Metoprolol -Patient is on IV Lasix and continuing to Diurese  -Continue monitor blood pressures per protocol   Moderate aortic stenosis -Recent echo done showed moderate aortic stenosis at [facility] -TAVR is not indicated at this time and cardiology is following   Acute renal failure superimposed on stage 3a chronic kidney disease ([place]):  -Baseline creatinine 1.38 on 03/14/2022.   -BUN/Cr Trend: Recent Labs  Lab 08/22/22 1145 08/23/22 0354 08/24/22 0356  BUN 73* 67* 58*  CREATININE 2.84* 2.27* 1.95*  -Hold Entresto -Avoid Nephrotoxic Medications, Contrast Dyes, Hypotension and Dehydration to Ensure Adequate Renal Perfusion and will need to Renally Adjust Meds -Continue to Monitor and Trend Renal Function carefully and repeat CMP in the AM    HLD (Hyperlipidemia) -C/w Statin Therapy with Atorvastatin  -Lipid Panel done and showed a total cholesterol/HDL ratio of 2.3, cholesterol level 175, HDL 33, LDL 17, triglycerides 125, VLDL 25   Type II diabetes mellitus with renal manifestations (HCC) -Recent A1c 7.8.   -Poorly controlled.  Patient taking glipizide and Jardiance at home which will be held -Sliding scale insulin -CBGs ranging from 106-213   Paroxysmal atrial fibrillation (HCC) s/p Cardioversion -Eliquis -Continue to Monitor on Telemetr and c/w Amiodarone 100 mg po daily and Metoprolol Succinate 50 mg po Daily    Hyperkalemia:  -Potassium 5.7 and is now 5.3 -10 g of Lokelma given again Recent Labs  Lab 08/22/22 1145 08/23/22 0354 08/24/22 0356  K 5.7* 5.3* 4.6  -Continue to Monitor and Trend and repeat CMP in the AM   PVD (peripheral vascular disease) (HCC) -Follow-up ABI  and showed "Right ABI is 0.58 and suggestive for moderate peripheral arterial disease. Normal left ABI, measuring 1.04" -Patient is on Plavix and Lipitor -Vascular Surgery Consulted given Podiatry Recommendations  Hypoalbuminemia -Patient's Albumin Level is now 3.0 -Continue to Monitor and Trend and repeat in the AM    Normocytic Anemia -Hgb/Hct Trend: Recent Labs  Lab 08/22/22 1145 08/23/22 1138 08/24/22 0356  HGB 10.6* 10.0* 10.0*  HCT 36.5* 33.7* 33.7*  -Check Anemia Panel In the AM -Continue to Monitor for S/Sx of Bleeding; No overt bleeding noted -Repeat CBC in the AM    DVT prophylaxis: apixaban (ELIQUIS) tablet 2.5 mg Start: 08/22/22 2200 apixaban (ELIQUIS) tablet 2.5 mg    Code Status: DNR Family Communication: No family currently at bedside  Disposition Plan:  Level of care: Telemetry Cardiac Status is: Inpatient Remains inpatient appropriate because: Needs further clinical improvement and diuresis back to dry weight as well as evaluation by vascular surgery for possible angiography    Consultants:  Cardiology Podiatry Vascular surgery  Procedures:  As delineated as above  Antimicrobials:  Anti-infectives (From admission, onward)    Start     Dose/Rate Route Frequency Ordered Stop   08/23/22 1600  cefTRIAXone (ROCEPHIN) 1 g in sodium chloride 0.9 % 100 mL IVPB        1 g 200 mL/hr over 30 Minutes Intravenous Every 24 hours 08/22/22 1752 08/29/22 1559   08/22/22 1545  cefTRIAXone (ROCEPHIN) 1 g  in sodium chloride 0.9 % 100 mL IVPB        1 g 200 mL/hr over 30 Minutes Intravenous  Once 08/22/22 1531 08/22/22 1746       Subjective: Seen and examined at bedside he is sitting in the chair and off of oxygen now.  Thinks he is doing better but still not back to his baseline.  Diuresing fairly well.  Legs are both wrapped.  No nausea or vomiting.  Denies any lightheadedness or dizziness.  No other concerns or close this time.  Objective: Vitals:   08/24/22  0026 08/24/22 0353 08/24/22 0751 08/24/22 1157  BP: 117/61 (!) 101/44 (!) 142/75 (!) 118/58  Pulse: (!) 51 (!) 55 66 (!) 58  Resp: _0 Temp: 97.8 F (36.6 C) 97.6 F (36.4 C) 98.4 F (36.9 C) 98.4 F (36.9 C)  TempSrc: Oral Oral Oral Oral  SpO2: 97% 98% 97% 100%  Weight:   85.2 kg   Height:        Intake/Output Summary (Last 24 hours) at 08/24/2022 1601 Last data filed at 08/24/2022 0645 Gross per 24 hour  Intake 100 ml  Output 1300 ml  Net -1200 ml   Filed Weights   08/22/22 1134 08/22/22 1427 08/24/22 0751  Weight: 85.7 kg 85.7 kg 85.2 kg    Examination: Physical Exam:  Constitutional: WN/WD, NAD and appears calm and comfortable Eyes: PERRL, lids and conjunctivae normal, sclerae anicteric  ENMT: External Ears, Nose appear normal. Grossly normal hearing. Mucous membranes are moist. Posterior pharynx clear of any exudate or lesions. Normal dentition.  Neck: Appears normal, supple, no cervical masses, normal ROM, no appreciable thyromegaly Respiratory: Clear to auscultation bilaterally, no wheezing, rales, rhonchi or crackles. Normal respiratory effort and patient is not tachypenic. No accessory muscle use.  Cardiovascular: RRR, no murmurs / rubs / gallops. S1 and S2 auscultated. No extremity edema. 2+ pedal pulses. No carotid bruits.  Abdomen: Soft, non-tender, non-distended. No masses palpated. No appreciable hepatosplenomegaly. Bowel sounds positive.  GU: Deferred. Musculoskeletal: No clubbing / cyanosis of digits/nails. No joint deformity upper and lower extremities. Good ROM, no contractures. Normal strength and muscle tone.  Skin: No rashes, lesions, ulcers. No induration; Warm and dry.  Neurologic: CN 2-12 grossly intact with no focal deficits. Sensation intact in all 4 Extremities, DTR normal. Strength 5/5 in all 4. Romberg sign cerebellar reflexes not assessed.  Psychiatric: Normal judgment and insight. Alert and oriented x 3. Normal mood and appropriate  affect.   Data Reviewed: I have personally reviewed following labs and imaging studies  CBC: Recent Labs  Lab 08/22/22 1145 08/23/22 1138 08/24/22 0356  WBC 11.1* 10.3 10.0  NEUTROABS  --  7.4 6.8  HGB 10.6* 10.0* 10.0*  HCT 36.5* 33.7* 33.7*  MCV 98.6 98.3 94.7  PLT 260 208 182   Basic Metabolic Panel: Recent Labs  Lab 08/22/22 1145 08/23/22 0354 08/24/22 0356  NA 138 138 138  K 5.7* 5.3* 4.6  CL 106 107 106  CO2 _1 GLUCOSE 241* 160* 126*  BUN 73* 67* 58*  CREATININE 2.84* 2.27* 1.95*  CALCIUM 8.5* 8.6* 8.5*  MG 3.1* 2.8* 2.5*  PHOS  --   --  4.0   GFR: Estimated Creatinine Clearance: 32.6 mL/min (A) (by C-G formula based on SCr of 1.95 mg/dL (H)). Liver Function Tests: Recent Labs  Lab 08/24/22 0356  AST 16  ALT 33  ALKPHOS 180*  BILITOT 0.7  PROT 6.1*  ALBUMIN  3.0*   No results for input(s): "LIPASE", "AMYLASE" in the last 168 hours. No results for input(s): "AMMONIA" in the last 168 hours. Coagulation Profile: No results for input(s): "INR", "PROTIME" in the last 168 hours. Cardiac Enzymes: No results for input(s): "CKTOTAL", "CKMB", "CKMBINDEX", "TROPONINI" in the last 168 hours. BNP (last 3 results) No results for input(s): "PROBNP" in the last 8760 hours. HbA1C: Recent Labs    08/23/22 0354  HGBA1C 8.9*   CBG: Recent Labs  Lab 08/23/22 1728 08/24/22 0027 08/24/22 0749 08/24/22 1154 08/24/22 1559  GLUCAP 213* 128* 106* 175* 176*   Lipid Profile: Recent Labs    08/22/22 1605  CHOL 75  HDL 33*  LDLCALC 17  TRIG 125  CHOLHDL 2.3   Thyroid Function Tests: No results for input(s): "TSH", "T4TOTAL", "FREET4", "T3FREE", "THYROIDAB" in the last 72 hours. Anemia Panel: No results for input(s): "VITAMINB12", "FOLATE", "FERRITIN", "TIBC", "IRON", "RETICCTPCT" in the last 72 hours. Sepsis Labs: No results for input(s): "PROCALCITON", "LATICACIDVEN" in the last 168 hours.  Recent Results (from the past 240 hour(s))  Blood  culture (routine x 2)     Status: None (Preliminary result)   Collection Time: 08/22/22  4:20 PM   Specimen: BLOOD  Result Value Ref Range Status   Specimen Description BLOOD BLOOD RIGHT WRIST  Final   Special Requests   Final    BOTTLES DRAWN AEROBIC AND ANAEROBIC Blood Culture adequate volume   Culture   Final    NO GROWTH 2 DAYS Performed at [name], [address], [place], [place] [address]    Report Status PENDING  Incomplete  Blood culture (routine x 2)     Status: None (Preliminary result)   Collection Time: 08/22/22  4:20 PM   Specimen: BLOOD  Result Value Ref Range Status   Specimen Description BLOOD BLOOD RIGHT FOREARM  Final   Special Requests   Final    BOTTLES DRAWN AEROBIC AND ANAEROBIC Blood Culture adequate volume   Culture   Final    NO GROWTH 2 DAYS Performed at [facility], [address] [place], [place] [address]    Report Status PENDING  Incomplete     Radiology Studies: US ARTERIAL ABI (SCREENING LOWER EXTREMITY)  Result Date: 08/23/2022 CLINICAL DATA:  Peripheral vascular disease. Right lower extremity rest pain. EXAM: NONINVASIVE PHYSIOLOGIC VASCULAR STUDY OF BILATERAL LOWER EXTREMITIES TECHNIQUE: Evaluation of both lower extremities were performed at rest, including calculation of ankle-brachial indices with single level Doppler, pressure and pulse volume recording. COMPARISON:  None Available. FINDINGS: Right ABI:  0.58 Left ABI:  1.04 Right Lower Extremity:  Irregular waveforms in the right ankle. Left Lower Extremity:  Irregular waveforms in the left ankle. 0.5-0.79 Moderate PAD IMPRESSION: 1. Right ABI is 0.58 and suggestive for moderate peripheral arterial disease. 2. Normal left ABI, measuring 1.04. Electronically Signed   By: Markus Daft M.D.   On: 08/23/2022 10:02   US Venous Img Lower Bilateral (DVT)  Result Date: 08/22/2022 CLINICAL DATA:  Cellulitis EXAM: BILATERAL LOWER EXTREMITY VENOUS DOPPLER ULTRASOUND TECHNIQUE:  Gray-scale sonography with compression, as well as color and duplex ultrasound, were performed to evaluate the deep venous system(s) from the level of the common femoral vein through the popliteal and proximal calf veins. COMPARISON:  None Available. FINDINGS: VENOUS Normal compressibility of the common femoral, superficial femoral, and popliteal veins, as well as the visualized calf veins. Visualized portions of profunda femoral vein and great saphenous vein unremarkable. No filling defects to  suggest DVT on grayscale or color Doppler imaging. Doppler waveforms show normal direction of venous flow, normal respiratory plasticity and response to augmentation. OTHER None. Limitations: none IMPRESSION: Negative. Electronically Signed   By: Rolm Baptise M.D.   On: 08/22/2022 19:08     Scheduled Meds:  amiodarone  100 mg Oral Daily   apixaban  2.5 mg Oral BID   atorvastatin  80 mg Oral QHS   brinzolamide  1 drop Both Eyes TID   And   brimonidine  1 drop Both Eyes TID   collagenase   Topical Daily   cyanocobalamin  1,000 mcg Oral Daily   furosemide  40 mg Intravenous Q12H   insulin aspart  0-5 Units Subcutaneous QHS   insulin aspart  0-9 Units Subcutaneous TID WC   isosorbide mononitrate  90 mg Oral Daily   metoprolol succinate  50 mg Oral Daily   pantoprazole  40 mg Oral Daily   timolol  1 drop Both Eyes QHS   Travoprost (BAK Free)  1 drop Both Eyes QHS   Continuous Infusions:  sodium chloride 10 mL/hr at 08/24/22 1506   cefTRIAXone (ROCEPHIN)  IV 1 g (08/24/22 1507)     LOS: 2 days    Time spent: *** minutes spent on chart review, discussion with nursing staff, consultants, updating family and interview/physical exam; more than 50% of that time was spent in counseling and/or coordination of care.   Kerney Elbe, DO [facility] Available via Epic secure chat 7am-7pm After these hours, please refer to coverage provider listed on [email or web address] 08/24/2022, 4:01 PM

## 2022-08-24 NOTE — Consult Note (Signed)
[facility]  Patient name: Bradley [name] MRN: [id number] DOB: 1941/03/15 Sex: male   REQUESTING PROVIDER:    Dr. Alberteen Spindle   REASON FOR CONSULT:    Lower extremity ulcers  HISTORY OF PRESENT ILLNESS:   Bradley [name] is a 81 y.o. male, who I have been asked to evaluate for bilateral lower extremity ulcers that have been present for about a month.  The patient was admitted for congestive heart failure.  He does complain of swelling and drainage in both legs.  The patient is a diabetic.  He has a history of coronary artery disease.  He is medically managed for hyperlipidemia and hypertension.  He is a former smoker.  PAST MEDICAL HISTORY    Past Medical History:  Diagnosis Date   (HFpEF) heart failure with preserved ejection fraction (HCC)    Coronary artery disease    Diabetes mellitus without complication (HCC)    Hyperlipidemia    Hypertension      FAMILY HISTORY   Family History  Problem Relation Age of Onset   CAD Mother    Urinary tract infection Father    Hip fracture Father    Cancer Sister    Cancer Brother     SOCIAL HISTORY:   Social History   Socioeconomic History   Marital status: Widowed    Spouse name: Not on file   Number of children: Not on file   Years of education: Not on file   Highest education level: Not on file  Occupational History   Not on file  Tobacco Use   Smoking status: Former   Smokeless tobacco: Never  Vaping Use   Vaping Use: Never used  Substance and Sexual Activity   Alcohol use: Never   Drug use: Never   Sexual activity: Not on file  Other Topics Concern   Not on file  Social History Narrative   Not on file   Social Determinants of Health   Financial Resource Strain: Not on file  Food Insecurity: No Food Insecurity (08/23/2022)   Hunger Vital Sign    Worried About Running Out of Food in the Last Year: Never true    Ran Out of Food in the Last Year:  Never true  Transportation Needs: No Transportation Needs (08/23/2022)   PRAPARE - Administrator, Civil Service (Medical): No    Lack of Transportation (Non-Medical): No  Physical Activity: Not on file  Stress: Not on file  Social Connections: Not on file  Intimate Partner Violence: Not At Risk (08/23/2022)   Humiliation, Afraid, Rape, and Kick questionnaire    Fear of Current or Ex-Partner: No    Emotionally Abused: No    Physically Abused: No    Sexually Abused: No    ALLERGIES:    Allergies  Allergen Reactions   Simvastatin Other (See Comments)    Per [facility] records Per [facility] records  Per [facility] records Per [facility] records    CURRENT MEDICATIONS:    Current Facility-Administered Medications  Medication Dose Route Frequency Provider Last Rate Last Admin   0.9 %  sodium chloride infusion   Intravenous PRN Marguerita Merles Latif, DO 10 mL/hr at 08/24/22 1506 New Bag at 08/24/22 1506   albuterol (PROVENTIL) (2.5 MG/3ML) 0.083% nebulizer solution 3 mL  3 mL Nebulization Q4H PRN Lorretta Harp, MD       amiodarone (PACERONE) tablet 100 mg  100 mg Oral Daily Lorretta Harp, MD   100 mg at 08/24/22  4401   apixaban (ELIQUIS) tablet 2.5 mg  2.5 mg Oral BID Lorretta Harp, MD   2.5 mg at 08/24/22 0850   atorvastatin (LIPITOR) tablet 80 mg  80 mg Oral QHS Lorretta Harp, MD   80 mg at 08/23/22 2055   brinzolamide (AZOPT) 1 % ophthalmic suspension 1 drop  1 drop Both Eyes TID Lorretta Harp, MD   1 drop at 08/24/22 1501   And   brimonidine (ALPHAGAN) 0.2 % ophthalmic solution 1 drop  1 drop Both Eyes TID Lorretta Harp, MD   1 drop at 08/24/22 1501   cefTRIAXone (ROCEPHIN) 1 g in sodium chloride 0.9 % 100 mL IVPB  1 g Intravenous Q24H Lorretta Harp, MD 200 mL/hr at 08/24/22 1507 1 g at 08/24/22 1507   collagenase (SANTYL) ointment   Topical Daily Linus Galas, DPM   Given at 08/24/22 1452   cyanocobalamin (VITAMIN B12) tablet 1,000 mcg  1,000 mcg Oral Daily Lorretta Harp, MD   1,000 mcg at 08/24/22 0849    dextromethorphan-guaiFENesin (MUCINEX DM) 30-600 MG per 12 hr tablet 1 tablet  1 tablet Oral BID PRN Lorretta Harp, MD       furosemide (LASIX) injection 40 mg  40 mg Intravenous Willette Pa, MD   40 mg at 08/24/22 0272   HYDROcodone-acetaminophen (NORCO/VICODIN) 5-325 MG per tablet 1 tablet  1 tablet Oral Q6H PRN Lorretta Harp, MD   1 tablet at 08/24/22 0856   insulin aspart (novoLOG) injection 0-5 Units  0-5 Units Subcutaneous QHS Lorretta Harp, MD   2 Units at 08/23/22 2153   insulin aspart (novoLOG) injection 0-9 Units  0-9 Units Subcutaneous TID WC Lorretta Harp, MD   2 Units at 08/24/22 1207   isosorbide mononitrate (IMDUR) 24 hr tablet 90 mg  90 mg Oral Daily Lorretta Harp, MD   90 mg at 08/24/22 0849   metoprolol succinate (TOPROL-XL) 24 hr tablet 50 mg  50 mg Oral Daily Lorretta Harp, MD   50 mg at 08/24/22 0849   nitroGLYCERIN (NITROSTAT) SL tablet 0.4 mg  0.4 mg Sublingual Q5 Min x 3 PRN Lorretta Harp, MD       pantoprazole (PROTONIX) EC tablet 40 mg  40 mg Oral Daily Lorretta Harp, MD   40 mg at 08/24/22 0850   polyethylene glycol (MIRALAX / GLYCOLAX) packet 17 g  17 g Oral Daily PRN Lorretta Harp, MD       timolol (TIMOPTIC) 0.5 % ophthalmic solution 1 drop  1 drop Both Eyes QHS Lorretta Harp, MD   1 drop at 08/23/22 2159   Travoprost (BAK Free) (TRAVATAN) 0.004 % ophthalmic solution SOLN 1 drop  1 drop Both Eyes QHS Lorretta Harp, MD   1 drop at 08/23/22 2159    REVIEW OF SYSTEMS:   [X]  denotes positive finding, [ ]  denotes negative finding Cardiac  Comments:  Chest pain or chest pressure:    Shortness of breath upon exertion:    Short of breath when lying flat:    Irregular heart rhythm:        Vascular    Pain in calf, thigh, or hip brought on by ambulation:    Pain in feet at night that wakes you up from your sleep:     Blood clot in your veins:    Leg swelling:         Pulmonary    Oxygen at home:    Productive cough:     Wheezing:  Neurologic    Sudden weakness in arms or legs:      Sudden numbness in arms or legs:     Sudden onset of difficulty speaking or slurred speech:    Temporary loss of vision in one eye:     Problems with dizziness:         Gastrointestinal    Blood in stool:      Vomited blood:         Genitourinary    Burning when urinating:     Blood in urine:        Psychiatric    Major depression:         Hematologic    Bleeding problems:    Problems with blood clotting too easily:        Skin    Rashes or ulcers:        Constitutional    Fever or chills:     PHYSICAL EXAM:   Vitals:   08/24/22 0353 08/24/22 0751 08/24/22 1157 08/24/22 1607  BP: (!) 101/44 (!) 142/75 (!) 118/58 (!) 102/42  Pulse: (!) 55 66 (!) 58 (!) 56  Resp: 18 16 18 16   Temp: 97.6 F (36.4 C) 98.4 F (36.9 C) 98.4 F (36.9 C) 98 F (36.7 C)  TempSrc: Oral Oral Oral Oral  SpO2: 98% 97% 100% 100%  Weight:  85.2 kg    Height:        GENERAL: The patient is a well-nourished male, in no acute distress. The vital signs are documented above. CARDIAC: There is a regular rate and rhythm.  VASCULAR: I could palpate a left femoral pulse but not a right however he was sitting in a chair.  Bilateral lower extremity dressings are in place and not removed. PULMONARY: Nonlabored respirations ABDOMEN: Soft and non-tender with normal pitched bowel sounds.  MUSCULOSKELETAL: There are no major deformities or cyanosis. NEUROLOGIC: No focal weakness or paresthesias are detected. SKIN: See photos below PSYCHIATRIC: The patient has a normal affect.     STUDIES:     ASSESSMENT and PLAN   Bilateral lower extremity wounds: I was able to palpate a left femoral pulse but not a right.  This potentially could be because he was sitting in a chair and I elected not to move him back to the bed.  I will evaluate his femoral pulses tomorrow.  More than likely he will require angiography to make sure he has adequate blood flow for wound healing.  His creatinine is slightly elevated.   He would benefit from IV hydration, however this could be difficult with his heart failure.  I will follow-up tomorrow.  Anticipate angiography next week pending his renal function.   , MD, FACS [facility] [phone] Pager [phone]

## 2022-08-24 NOTE — Consult Note (Signed)
Reason for Consult: Ulcerations right foot. Referring Physician: [name] is an 81 y.o. male.  HPI: This is an 81 year old male with diabetes and peripheral vascular disease recently admitted for congestive heart failure after evaluation down at the [place].  Relates wounds on both legs and feet over the last month.  Recent increased swelling and drainage.  Past Medical History:  Diagnosis Date   (HFpEF) heart failure with preserved ejection fraction ([place])    Coronary artery disease    Diabetes mellitus without complication ([place])    Hyperlipidemia    Hypertension     Past Surgical History:  Procedure Laterality Date   CATARACT EXTRACTION     CHOLECYSTECTOMY     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   [facility]: LIMA-LAD, SVG-OM1, and SVG-rPDA   INTRAMEDULLARY (IM) NAIL INTERTROCHANTERIC Right 03/12/2022   Procedure: INTRAMEDULLARY (IM) NAIL INTERTROCHANTRIC;  Surgeon: Bradley Mull, MD;  Location: [facility] ORS;  Service: Orthopedics;  Laterality: Right;   LEFT HEART CATH AND CORS/GRAFTS ANGIOGRAPHY N/A 07/10/2021   Procedure: LEFT HEART CATH AND CORS/GRAFTS ANGIOGRAPHY;  Surgeon: Bradley Bush, MD;  Location: [place] [facility];  Service: Cardiovascular;  Laterality: N/A;    Family History  Problem Relation Age of Onset   CAD Mother    Urinary tract infection Father    Hip fracture Father    Cancer Sister    Cancer Brother     Social History:  reports that he has quit smoking. He has never used smokeless tobacco. He reports that he does not drink alcohol and does not use drugs.  Allergies:  Allergies  Allergen Reactions   Simvastatin Other (See Comments)    Per [facility] records Per [place] records  Per [place] records Per [place] records    Medications: Scheduled:  amiodarone  100 mg Oral Daily   apixaban  2.5 mg Oral BID   atorvastatin  80 mg Oral QHS   brinzolamide  1 drop Both Eyes TID   And   brimonidine  1 drop Both Eyes TID   cyanocobalamin  1,000 mcg Oral Daily    furosemide  40 mg Intravenous Q12H   insulin aspart  0-5 Units Subcutaneous QHS   insulin aspart  0-9 Units Subcutaneous TID WC   isosorbide mononitrate  90 mg Oral Daily   metoprolol succinate  50 mg Oral Daily   pantoprazole  40 mg Oral Daily   timolol  1 drop Both Eyes QHS   Travoprost (BAK Free)  1 drop Both Eyes QHS    Results for orders placed or performed during the hospital encounter of 08/22/22 (from the past 48 hour(s))  Lipid panel     Status: Abnormal   Collection Time: 08/22/22  4:05 PM  Result Value Ref Range   Cholesterol 75 0 - 200 mg/dL   Triglycerides 125 <150 mg/dL   HDL 33 (L) >40 mg/dL   Total CHOL/HDL Ratio 2.3 RATIO   VLDL 25 0 - 40 mg/dL   LDL Cholesterol 17 0 - 99 mg/dL    Comment:        Total Cholesterol/HDL:CHD Risk Coronary Heart Disease Risk Table                     Men   Women  1/2 Average Risk   3.4   3.3  Average Risk       5.0   4.4  2 X Average Risk   9.6   7.1  3 X  Average Risk  23.4   11.0        Use the calculated Patient Ratio above and the CHD Risk Table to determine the patient's CHD Risk.        ATP III CLASSIFICATION (LDL):  <100     mg/dL   Optimal  100-129  mg/dL   Near or Above                    Optimal  130-159  mg/dL   Borderline  160-189  mg/dL   High  >190     mg/dL   Very High Performed at [facility], [address] [place], [place] [address]   Blood culture (routine x 2)     Status: None (Preliminary result)   Collection Time: 08/22/22  4:20 PM   Specimen: BLOOD  Result Value Ref Range   Specimen Description BLOOD BLOOD RIGHT WRIST    Special Requests      BOTTLES DRAWN AEROBIC AND ANAEROBIC Blood Culture adequate volume   Culture      NO GROWTH 2 DAYS Performed at [facility], [address], [place], [place] [address]    Report Status PENDING   Blood culture (routine x 2)     Status: None (Preliminary result)   Collection Time: 08/22/22  4:20 PM   Specimen: BLOOD  Result Value  Ref Range   Specimen Description BLOOD BLOOD RIGHT FOREARM    Special Requests      BOTTLES DRAWN AEROBIC AND ANAEROBIC Blood Culture adequate volume   Culture      NO GROWTH 2 DAYS Performed at [facility], [address] [place], [place] [address]    Report Status PENDING   Troponin I (High Sensitivity)     Status: Abnormal   Collection Time: 08/22/22  5:47 PM  Result Value Ref Range   Troponin I (High Sensitivity) 38 (H) <18 ng/L    Comment: (NOTE) Elevated high sensitivity troponin I (hsTnI) values and significant  changes across serial measurements may suggest ACS but many other  chronic and acute conditions are known to elevate hsTnI results.  Refer to the "Links" section for chest pain algorithms and additional  guidance. Performed at [facility], [address] [place], [place] [address]   CBG monitoring, ED     Status: Abnormal   Collection Time: 08/22/22 10:56 PM  Result Value Ref Range   Glucose-Capillary 174 (H) 70 - 99 mg/dL    Comment: Glucose reference range applies only to samples taken after fasting for at least 8 hours.  Basic metabolic panel     Status: Abnormal   Collection Time: 08/23/22  3:54 AM  Result Value Ref Range   Sodium 138 135 - 145 mmol/L   Potassium 5.3 (H) 3.5 - 5.1 mmol/L   Chloride 107 98 - 111 mmol/L   CO2 22 22 - 32 mmol/L   Glucose, Bld 160 (H) 70 - 99 mg/dL    Comment: Glucose reference range applies only to samples taken after fasting for at least 8 hours.   BUN 67 (H) 8 - 23 mg/dL   Creatinine, Ser 2.27 (H) 0.61 - 1.24 mg/dL   Calcium 8.6 (L) 8.9 - 10.3 mg/dL   GFR, Estimated 28 (L) >60 mL/min    Comment: (NOTE) Calculated using the CKD-EPI Creatinine Equation (2021)    Anion gap 9 5 - 15    Comment: Performed at [facility], [address]  [place], [place] [address]  C-reactive protein     Status: Abnormal   Collection Time: 08/23/22  3:54 AM  Result Value Ref Range   CRP 1.9 (H) <1.0 mg/dL     Comment: Performed at [facility] [address] [place], [place] [address]  Magnesium     Status: Abnormal   Collection Time: 08/23/22  3:54 AM  Result Value Ref Range   Magnesium 2.8 (H) 1.7 - 2.4 mg/dL    Comment: Performed at [facility], [address] [place], [place] [address]  Hemoglobin A1c     Status: Abnormal   Collection Time: 08/23/22  3:54 AM  Result Value Ref Range   Hgb A1c MFr Bld 8.9 (H) 4.8 - 5.6 %    Comment: (NOTE)         Prediabetes: 5.7 - 6.4         Diabetes: >6.4         Glycemic control for adults with diabetes: <7.0    Mean Plasma Glucose 209 mg/dL    Comment: (NOTE) Performed At: [facility] [place], [place] [id number] Rush Farmer MD [id number]   Troponin I (High Sensitivity)     Status: Abnormal   Collection Time: 08/23/22  3:54 AM  Result Value Ref Range   Troponin I (High Sensitivity) 35 (H) <18 ng/L    Comment: (NOTE) Elevated high sensitivity troponin I (hsTnI) values and significant  changes across serial measurements may suggest ACS but many other  chronic and acute conditions are known to elevate hsTnI results.  Refer to the "Links" section for chest pain algorithms and additional  guidance. Performed at [facility], [address] [place], [place] [address]   CBG monitoring, ED     Status: Abnormal   Collection Time: 08/23/22  7:58 AM  Result Value Ref Range   Glucose-Capillary 115 (H) 70 - 99 mg/dL    Comment: Glucose reference range applies only to samples taken after fasting for at least 8 hours.   Comment 1 Notify RN   CBC with Differential/Platelet     Status: Abnormal   Collection Time: 08/23/22 11:38 AM  Result Value Ref Range   WBC 10.3 4.0 - 10.5 K/uL   RBC 3.43 (L) 4.22 - 5.81 MIL/uL   Hemoglobin 10.0 (L) 13.0 - 17.0 g/dL   HCT 33.7 (L) 39.0 - 52.0 %   MCV 98.3 80.0 - 100.0 fL   MCH 29.2 26.0 - 34.0 pg   MCHC 29.7 (L) 30.0 - 36.0 g/dL   RDW 21.0 (H) 11.5  - 15.5 %   Platelets 208 150 - 400 K/uL   nRBC 0.0 0.0 - 0.2 %   Neutrophils Relative % 71 %   Neutro Abs 7.4 1.7 - 7.7 K/uL   Lymphocytes Relative 16 %   Lymphs Abs 1.6 0.7 - 4.0 K/uL   Monocytes Relative 9 %   Monocytes Absolute 1.0 0.1 - 1.0 K/uL   Eosinophils Relative 2 %   Eosinophils Absolute 0.2 0.0 - 0.5 K/uL   Basophils Relative 1 %   Basophils Absolute 0.1 0.0 - 0.1 K/uL   Immature Granulocytes 1 %   Abs Immature Granulocytes 0.07 0.00 - 0.07 K/uL    Comment: Performed at [facility], [address] [place], [place] [address]  Glucose, capillary     Status: Abnormal   Collection Time: 08/23/22  1:19 PM  Result Value Ref Range   Glucose-Capillary 199 (H) 70 -  99 mg/dL    Comment: Glucose reference range applies only to samples taken after fasting for at least 8 hours.  Glucose, capillary     Status: Abnormal   Collection Time: 08/23/22  5:28 PM  Result Value Ref Range   Glucose-Capillary 213 (H) 70 - 99 mg/dL    Comment: Glucose reference range applies only to samples taken after fasting for at least 8 hours.  Glucose, capillary     Status: Abnormal   Collection Time: 08/24/22 12:27 AM  Result Value Ref Range   Glucose-Capillary 128 (H) 70 - 99 mg/dL    Comment: Glucose reference range applies only to samples taken after fasting for at least 8 hours.  CBC with Differential/Platelet     Status: Abnormal   Collection Time: 08/24/22  3:56 AM  Result Value Ref Range   WBC 10.0 4.0 - 10.5 K/uL   RBC 3.56 (L) 4.22 - 5.81 MIL/uL   Hemoglobin 10.0 (L) 13.0 - 17.0 g/dL   HCT 33.7 (L) 39.0 - 52.0 %   MCV 94.7 80.0 - 100.0 fL   MCH 28.1 26.0 - 34.0 pg   MCHC 29.7 (L) 30.0 - 36.0 g/dL   RDW 20.6 (H) 11.5 - 15.5 %   Platelets 247 150 - 400 K/uL   nRBC 0.0 0.0 - 0.2 %   Neutrophils Relative % 69 %   Neutro Abs 6.8 1.7 - 7.7 K/uL   Lymphocytes Relative 18 %   Lymphs Abs 1.8 0.7 - 4.0 K/uL   Monocytes Relative 10 %   Monocytes Absolute 1.0 0.1 - 1.0 K/uL    Eosinophils Relative 2 %   Eosinophils Absolute 0.2 0.0 - 0.5 K/uL   Basophils Relative 1 %   Basophils Absolute 0.1 0.0 - 0.1 K/uL   Immature Granulocytes 0 %   Abs Immature Granulocytes 0.04 0.00 - 0.07 K/uL    Comment: Performed at [facility], [address] [place], [place] [address]  Comprehensive metabolic panel     Status: Abnormal   Collection Time: 08/24/22  3:56 AM  Result Value Ref Range   Sodium 138 135 - 145 mmol/L   Potassium 4.6 3.5 - 5.1 mmol/L   Chloride 106 98 - 111 mmol/L   CO2 24 22 - 32 mmol/L   Glucose, Bld 126 (H) 70 - 99 mg/dL    Comment: Glucose reference range applies only to samples taken after fasting for at least 8 hours.   BUN 58 (H) 8 - 23 mg/dL   Creatinine, Ser 1.95 (H) 0.61 - 1.24 mg/dL   Calcium 8.5 (L) 8.9 - 10.3 mg/dL   Total Protein 6.1 (L) 6.5 - 8.1 g/dL   Albumin 3.0 (L) 3.5 - 5.0 g/dL   AST 16 15 - 41 U/L   ALT 33 0 - 44 U/L   Alkaline Phosphatase 180 (H) 38 - 126 U/L   Total Bilirubin 0.7 0.3 - 1.2 mg/dL   GFR, Estimated 34 (L) >60 mL/min    Comment: (NOTE) Calculated using the CKD-EPI Creatinine Equation (2021)    Anion gap 8 5 - 15    Comment: Performed at [facility], [address], [place], [place] [address]  Phosphorus     Status: None   Collection Time: 08/24/22  3:56 AM  Result Value Ref Range   Phosphorus 4.0 2.5 - 4.6 mg/dL    Comment: Performed at [facility], [address], [place], [place] [address]  Magnesium     Status:  Abnormal   Collection Time: 08/24/22  3:56 AM  Result Value Ref Range   Magnesium 2.5 (H) 1.7 - 2.4 mg/dL    Comment: Performed at [facility], [address] [place], [place] [address]  Glucose, capillary     Status: Abnormal   Collection Time: 08/24/22  7:49 AM  Result Value Ref Range   Glucose-Capillary 106 (H) 70 - 99 mg/dL    Comment: Glucose reference range applies only to samples taken after fasting for at least 8 hours.  Glucose, capillary      Status: Abnormal   Collection Time: 08/24/22 11:54 AM  Result Value Ref Range   Glucose-Capillary 175 (H) 70 - 99 mg/dL    Comment: Glucose reference range applies only to samples taken after fasting for at least 8 hours.    US ARTERIAL ABI (SCREENING LOWER EXTREMITY)  Result Date: 08/23/2022 CLINICAL DATA:  Peripheral vascular disease. Right lower extremity rest pain. EXAM: NONINVASIVE PHYSIOLOGIC VASCULAR STUDY OF BILATERAL LOWER EXTREMITIES TECHNIQUE: Evaluation of both lower extremities were performed at rest, including calculation of ankle-brachial indices with single level Doppler, pressure and pulse volume recording. COMPARISON:  None Available. FINDINGS: Right ABI:  0.58 Left ABI:  1.04 Right Lower Extremity:  Irregular waveforms in the right ankle. Left Lower Extremity:  Irregular waveforms in the left ankle. 0.5-0.79 Moderate PAD IMPRESSION: 1. Right ABI is 0.58 and suggestive for moderate peripheral arterial disease. 2. Normal left ABI, measuring 1.04. Electronically Signed   By: Markus Daft M.D.   On: 08/23/2022 10:02   US Venous Img Lower Bilateral (DVT)  Result Date: 08/22/2022 CLINICAL DATA:  Cellulitis EXAM: BILATERAL LOWER EXTREMITY VENOUS DOPPLER ULTRASOUND TECHNIQUE: Gray-scale sonography with compression, as well as color and duplex ultrasound, were performed to evaluate the deep venous system(s) from the level of the common femoral vein through the popliteal and proximal calf veins. COMPARISON:  None Available. FINDINGS: VENOUS Normal compressibility of the common femoral, superficial femoral, and popliteal veins, as well as the visualized calf veins. Visualized portions of profunda femoral vein and great saphenous vein unremarkable. No filling defects to suggest DVT on grayscale or color Doppler imaging. Doppler waveforms show normal direction of venous flow, normal respiratory plasticity and response to augmentation. OTHER None. Limitations: none IMPRESSION: Negative.  Electronically Signed   By: Rolm Baptise M.D.   On: 08/22/2022 19:08   DG Chest 2 View  Result Date: 08/22/2022 CLINICAL DATA:  History of congestive heart failure with dyspnea on exertion and bilateral leg edema EXAM: CHEST - 2 VIEW COMPARISON:  Chest radiograph dated 03/12/2022 FINDINGS: Low lung volumes. No focal consolidations. Trace bilateral pleural effusions. No pneumothorax. Similar postsurgical cardiomediastinal silhouette. Median sternotomy wires are nondisplaced. Surgical clips project over the mediastinum. IMPRESSION: Low lung volumes with trace bilateral pleural effusions. No focal consolidations. Electronically Signed   By: Darrin Nipper M.D.   On: 08/22/2022 12:12    Review of Systems  Constitutional:  Negative for chills and fever.  HENT:  Negative for sinus pain and sore throat.   Respiratory:  Negative for cough and shortness of breath.   Cardiovascular:  Negative for chest pain and palpitations.  Gastrointestinal:  Negative for nausea and vomiting.  Endocrine: Negative for polydipsia and polyuria.  Genitourinary:  Negative for frequency and urgency.  Musculoskeletal:        Patient does relate some significant pain with the sores on his feet and legs.  Skin:        Patient relates significant  leg swelling with open sores on the feet and legs.  Has had recent drainage.  Neurological:  Negative for numbness.  Psychiatric/Behavioral:  Negative for confusion. The patient is not nervous/anxious.    Blood pressure (!) 118/58, pulse (!) 58, temperature 98.4 F (36.9 C), temperature source Oral, resp. rate 18, height 6' (1.829 m), weight 85.2 kg, SpO2 100 %. Physical Exam Cardiovascular:     Comments: DP and PT pulses could not be clearly palpated. Musculoskeletal:     Comments: Adequate range of motion of the pedal joints with some guarding due to pain.  Muscle testing deferred.  Skin:    Comments: The skin is warm dry and atrophic with bilateral lower extremity edema and some  hyperpigmentation.  Some erythema noted on the dorsal midfoot with multiple ulcerative areas with the largest measuring approximately 12 mm x 8 to 9 mm with depth of 1 to 2 mm with numerous smaller lesions in the range of 3 to 4 mm diameter with a depth of 1 mm, both pre and postdebridement.  No expressible purulence.  Yellow fibrotic base noted to the ulcers.  Absent hair growth.  Neurological:     Comments: Sensation appears grossly intact and symmetric.          Assessment/Plan: Assessment: 1.  Multiple full-thickness ulcerative areas right foot. 2.  Diabetes with peripheral vascular disease.  Plan: Excisional debridement of the ulcerative areas on the right forefoot sharply using tissue nippers including the central devitalized full-thickness tissue down to the layer of the superficial fascia.  Saline wet-to-dry gauze dressing applied to the right foot.  We will order Santyl ointment and moistened gauze dressing to the ulcerations on the right foot.  Would recommend vascular consultation for their input as far as to his circulation status.  May not be a good candidate for intervention due to his kidney functions but appreciate vascular evaluation and input.  Continue with wound care recommendations for the leg ulcerations.  Podiatry will sign off at this point.  Ricci Barker 08/24/2022, 12:02 PM

## 2022-08-24 NOTE — Discharge Instructions (Signed)
Change left leg dressing daily and as need for drainage  Call office for appointment next week

## 2022-08-24 NOTE — Progress Notes (Signed)
Mobility Specialist - Progress Note   08/24/22 1450  Mobility  Activity Dangled on edge of bed  Level of Assistance Independent  Assistive Device None  Distance Ambulated (ft) 0 ft  Range of Motion/Exercises All extremities  Activity Response Tolerated well  Mobility Referral Yes  $Mobility charge 1 Mobility   Pt sitting in recliner on RA upon arrival. Pt defers OOB mobility d/t heel pain. Pt completes leg extensions, ankle pumps, and seated marches in recliner. Pt left in recliner with needs in reach.   Terrilyn Saver  Mobility Specialist  08/24/22 2:51 PM

## 2022-08-24 NOTE — Progress Notes (Signed)
Rounding Note    Patient Name: Bradley [name] Date of Encounter: 08/24/2022   [facility] Cardiologist: Julien Nordmann, MD   Subjective   UOP -2.2L. kidney function improving. He denies chest pain. Dyspnea is improving.  Inpatient Medications    Scheduled Meds:  amiodarone  100 mg Oral Daily   apixaban  2.5 mg Oral BID   atorvastatin  80 mg Oral QHS   brinzolamide  1 drop Both Eyes TID   And   brimonidine  1 drop Both Eyes TID   cyanocobalamin  1,000 mcg Oral Daily   furosemide  40 mg Intravenous Q12H   insulin aspart  0-5 Units Subcutaneous QHS   insulin aspart  0-9 Units Subcutaneous TID WC   isosorbide mononitrate  90 mg Oral Daily   metoprolol succinate  50 mg Oral Daily   pantoprazole  40 mg Oral Daily   timolol  1 drop Both Eyes QHS   Travoprost (BAK Free)  1 drop Both Eyes QHS   Continuous Infusions:  cefTRIAXone (ROCEPHIN)  IV 200 mL/hr at 08/23/22 1840   PRN Meds: albuterol, dextromethorphan-guaiFENesin, HYDROcodone-acetaminophen, nitroGLYCERIN, polyethylene glycol   Vital Signs    Vitals:   08/23/22 1533 08/23/22 1934 08/24/22 0026 08/24/22 0353  BP: 121/78 (!) 112/52 117/61 (!) 101/44  Pulse: 64 (!) 59 (!) 51 (!) 55  Resp: 18 18 17 18   Temp: 98.6 F (37 C) 97.9 F (36.6 C) 97.8 F (36.6 C) 97.6 F (36.4 C)  TempSrc: Oral  Oral Oral  SpO2: 100% 97% 97% 98%  Weight:      Height:        Intake/Output Summary (Last 24 hours) at 08/24/2022 0746 Last data filed at 08/24/2022 0645 Gross per 24 hour  Intake 340 ml  Output 2600 ml  Net -2260 ml      08/22/2022    2:27 PM 08/22/2022   11:34 AM 06/10/2022    2:10 PM  Last 3 Weights  Weight (lbs) 188 lb 15 oz 189 lb 179 lb 4 oz  Weight (kg) 85.7 kg 85.73 kg 81.307 kg      Telemetry    nsr - Personally Reviewed  ECG    No new - Personally Reviewed  Physical Exam   GEN: No acute distress.   Neck: 7 cm JVD Cardiac: RRR, no murmurs, rubs, or gallops.  Respiratory: Clear to  auscultation bilaterally. GI: Soft, nontender, non-distended  MS: No edema; No deformity. Neuro:  Nonfocal  Psych: Normal affect   Labs    High Sensitivity Troponin:   Recent Labs  Lab 08/22/22 1747 08/23/22 0354  TROPONINIHS 38* 35*     Chemistry Recent Labs  Lab 08/22/22 1145 08/23/22 0354 08/24/22 0356  NA 138 138 138  K 5.7* 5.3* 4.6  CL 106 107 106  CO2 24 22 24   GLUCOSE 241* 160* 126*  BUN 73* 67* 58*  CREATININE 2.84* 2.27* 1.95*  CALCIUM 8.5* 8.6* 8.5*  MG 3.1* 2.8* 2.5*  PROT  --   --  6.1*  ALBUMIN  --   --  3.0*  AST  --   --  16  ALT  --   --  33  ALKPHOS  --   --  180*  BILITOT  --   --  0.7  GFRNONAA 22* 28* 34*  ANIONGAP 8 9 8     Lipids  Recent Labs  Lab 08/22/22 1605  CHOL 75  TRIG 125  HDL 33*  LDLCALC 17  CHOLHDL 2.3    Hematology Recent Labs  Lab 08/22/22 1145 08/23/22 1138 08/24/22 0356  WBC 11.1* 10.3 10.0  RBC 3.70* 3.43* 3.56*  HGB 10.6* 10.0* 10.0*  HCT 36.5* 33.7* 33.7*  MCV 98.6 98.3 94.7  MCH 28.6 29.2 28.1  MCHC 29.0* 29.7* 29.7*  RDW 20.9* 21.0* 20.6*  PLT 260 208 247   Thyroid No results for input(s): "TSH", "FREET4" in the last 168 hours.  BNP Recent Labs  Lab 08/22/22 1145  BNP >4,500.0*    DDimer No results for input(s): "DDIMER" in the last 168 hours.   Radiology    US ARTERIAL ABI (SCREENING LOWER EXTREMITY)  Result Date: 08/23/2022 CLINICAL DATA:  Peripheral vascular disease. Right lower extremity rest pain. EXAM: NONINVASIVE PHYSIOLOGIC VASCULAR STUDY OF BILATERAL LOWER EXTREMITIES TECHNIQUE: Evaluation of both lower extremities were performed at rest, including calculation of ankle-brachial indices with single level Doppler, pressure and pulse volume recording. COMPARISON:  None Available. FINDINGS: Right ABI:  0.58 Left ABI:  1.04 Right Lower Extremity:  Irregular waveforms in the right ankle. Left Lower Extremity:  Irregular waveforms in the left ankle. 0.5-0.79 Moderate PAD IMPRESSION: 1. Right ABI  is 0.58 and suggestive for moderate peripheral arterial disease. 2. Normal left ABI, measuring 1.04. Electronically Signed   By: Richarda Overlie M.D.   On: 08/23/2022 10:02   US Venous Img Lower Bilateral (DVT)  Result Date: 08/22/2022 CLINICAL DATA:  Cellulitis EXAM: BILATERAL LOWER EXTREMITY VENOUS DOPPLER ULTRASOUND TECHNIQUE: Gray-scale sonography with compression, as well as color and duplex ultrasound, were performed to evaluate the deep venous system(s) from the level of the common femoral vein through the popliteal and proximal calf veins. COMPARISON:  None Available. FINDINGS: VENOUS Normal compressibility of the common femoral, superficial femoral, and popliteal veins, as well as the visualized calf veins. Visualized portions of profunda femoral vein and great saphenous vein unremarkable. No filling defects to suggest DVT on grayscale or color Doppler imaging. Doppler waveforms show normal direction of venous flow, normal respiratory plasticity and response to augmentation. OTHER None. Limitations: none IMPRESSION: Negative. Electronically Signed   By: Charlett Nose M.D.   On: 08/22/2022 19:08   DG Chest 2 View  Result Date: 08/22/2022 CLINICAL DATA:  History of congestive heart failure with dyspnea on exertion and bilateral leg edema EXAM: CHEST - 2 VIEW COMPARISON:  Chest radiograph dated 03/12/2022 FINDINGS: Low lung volumes. No focal consolidations. Trace bilateral pleural effusions. No pneumothorax. Similar postsurgical cardiomediastinal silhouette. Median sternotomy wires are nondisplaced. Surgical clips project over the mediastinum. IMPRESSION: Low lung volumes with trace bilateral pleural effusions. No focal consolidations. Electronically Signed   By: Agustin Cree M.D.   On: 08/22/2022 12:12    Cardiac Studies   Cardiac cath 08/04/22 1. Known native 3VD (RCA not injected, known CTO)  2. Patent LIMA-LAD  3. Patent free RIMA-OM3  4. SVG-RPL with distal 99% lesion on hairpin turn adjacent to  distal anastomosis (unchanged compared with Jan 2023 cath)   Recommendations:   1. D/C left radial TR band per protocol  2. Unchanged coronary anatomy compared with Jan 2023 cath.  The SVG-RPL lesion was attempted previously unsuccessfully.  Given new symptoms, unlikely that this is a culprit for current CP.  3. Continue work-up/management for LFLG aortic stenosis.    Echo 08/05/22 INTERPRETATION ---------------------------------------------------------------    INDETERMINATE.   VALVULAR REGURGITATION: MODERATE AR   VALVULAR STENOSIS: SEVERE AS   Note: LOW-DOSE DOBUTAMINE TO ASSESS AORTIC VALVE GRADIENTS   REST: Peak Vel=2.25m/sec;  peak, mean; V2 = 55cm; V1 = 17cm;   LVOT Vel=0.2m/sec;  DI = 0.31; AVA=1.0cm2; SVI = 26.13ml/m2    : Peak Vel=2.61m/sec; peak, mean; V2 = 59cm; V1 =   12cm;LVOT Vel= 0.53m/sec;  DI = 0.20; AVA=0.6cm2; SVI = 18.53ml/m2    : Peak Vel=2.66m/sec; peak, mean; V2 = 59cm; V1 =   18cm;LVOT Vel= 0.38m/sec;  DI = 0.30; AVA=1.0cm2; SVI = 27.81ml/m2    : Peak Vel=2.3m/sec; 35.36mmHg peak, mean; V2 = 55cm; V1 =   11cm;LVOT Vel= 0.23m/sec;  DI = 0.20; AVA=0.6cm2; SVI = 17.61ml/m2    : Peak Vel=3.43m/sec; peak, mean; V2 = 54cm; V1 =   12cm;LVOT Vel= 0.52m/sec;  DI = 0.22; AVA=0.7cm2; SVI = 18.57ml/m2    MILD TO MODERATE AORTIC REGURGITATION AT REST    NO CHANGE IN LV FUNCTION/STROKE VOLUME WITH DOBUTAMINE   MINIMAL INCREASE IN MEAN/PEAK GRADIENT WITH DOBUTAMINE; MEAN GRADIENT   INCREASED FROM 19 TO   Maximum workload of  1.00 METs was achieved during exercise.   NORMAL RESTING BP - BLUNTED RESPONSE   Echocardiogram completed on 03/12/2022 1. Left ventricular ejection fraction, by estimation, is 40 to 45%. The  left ventricle has mildly decreased function. The left ventricle  demonstrates regional wall motion abnormalities (see scoring  diagram/findings for description). There  is moderate  left ventricular hypertrophy. Left ventricular diastolic parameters are  consistent with Grade II diastolic dysfunction (pseudonormalization).  Elevated left atrial pressure. There is severe hypokinesis of the left  ventricular, basal-mid inferoseptal wall  and inferior wall.   2. Right ventricular systolic function is mildly reduced. The right  ventricular size is normal.   3. Left atrial size was mildly dilated.   4. Right atrial size was mildly dilated.   5. The mitral valve is degenerative. Mild mitral valve regurgitation.   6. The aortic valve is tricuspid. There is moderate calcification of the  aortic valve. There is moderate thickening of the aortic valve. Aortic  valve regurgitation is mild. 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  While the mean gradient is only mildly   elevated, the abnormal valve area and dimensionless index calculations  suggest at least moderate to severe low-flow:low-gradient aortic stenosis.  Visual assessment of leaflet movement suggests that aortic stenosis is not  critical.   7. The inferior vena cava is normal in size with <50% respiratory  variability, suggesting right atrial pressure of 8 mmHg.   Patient Profile     81 y.o. 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 - patient presented with lower leg edema, suspected cellulitis and SOB found to have BNP>4500 - second admission this month for heart failure - Echo from November 2022 showed LVEF 30-35% - Echo July 2023 showed LVEF 40-45% - Echo 08/02/22 showed mild LV dysfunction, mild LVH, moderate AS - he has mod to severe AS, which may be contributing to symptoms - he reports compliance with lasix 40mg  daily at home - PTA spironolactone 12.5mg  daily, Jardiance 25mg  daily, Entresto  24-26mg BID, and Toprol 50mg  daily - Toprol continued, all other meds held for AKI - In the ER he was given IV lasix 80mg  in the and started on IV lasix 40mg  BID -  Net -1.4L - he reports lower leg edema is improving - continue to monitor daily weights, strict I/Os, and kidney function   Cellulitis of lower extremity - abx per IM - DVT study negative   Moderate to Severe AS - Echo from July showed LVEF 40-45%, mod LVH, G2DD, mild MR, mod to severe AS, low-flow low gradient aortic stenosis - [facility] showed mild LV dysfunction with mild LVH, mild RV systolic dysfunction, moderate AS and mild to mod AS, gradient increased from 19-6324mmHg.  Recommended low-dose dobutamine to assess aortic valve gradients. IT was disease was moderate and TAVR was not indicated at the time.    CAD s/p CABG and prior PCI - [facility] showing [facility] - HS trop elevated with flat trend - no chest pain reported - continue Lipitor 80mg  daily, Toprol 50mg  daily, Imdur 60mg  daily - no further ischemic work-up at this time   HTN - BP is good, continue current medications   Acute on chronic renal failure CKD stage 3 - Scr 1.3-1.5 at baseline - [facility] 1.8-1.9 - on this admission Scr 2.84, BUN 73 - improving with diuresis   HLD - continue statin therapy   Persistent Afib s/p prior cardioversion - remains in SB - continue amiodarone 100mg  daily - Toprol 50mg  daily   Hyperkalemia - s/p lokelma - resolved  For questions or updates, please contact [facility] Please consult [email or web address] for contact info under        Signed, Cadence David StallH Furth, PA-C  08/24/2022, 7:46 AM     Cardiology Attending  Patient seen and examined. Agree with above. The patient feels much better though he still does not think he is back to his baseline. His legs are less swollen and he has minimal rales on lung exam. Wt is unchanged and kidney function continues to  improve with IV lasix. Would continue for now until kidney function bumps or he feels like he is back to normal.  Sharlot GowdaGregg Rilley Stash,MD

## 2022-08-24 NOTE — Plan of Care (Signed)
  Problem: Metabolic: Goal: Ability to maintain appropriate glucose levels will improve Outcome: Progressing   Problem: Activity: Goal: Risk for activity intolerance will decrease Outcome: Progressing   Problem: Coping: Goal: Level of anxiety will decrease Outcome: Progressing

## 2022-08-25 DIAGNOSIS — I5043 Acute on chronic combined systolic (congestive) and diastolic (congestive) heart failure: Secondary | ICD-10-CM | POA: Diagnosis not present

## 2022-08-25 LAB — BASIC METABOLIC PANEL
Anion gap: 8 (ref 5–15)
BUN: 61 mg/dL — ABNORMAL HIGH (ref 8–23)
CO2: 25 mmol/L (ref 22–32)
Calcium: 8.2 mg/dL — ABNORMAL LOW (ref 8.9–10.3)
Chloride: 101 mmol/L (ref 98–111)
Creatinine, Ser: 1.99 mg/dL — ABNORMAL HIGH (ref 0.61–1.24)
GFR, Estimated: 33 mL/min — ABNORMAL LOW (ref >60)
Glucose, Bld: 208 mg/dL — ABNORMAL HIGH (ref 70–99)
Potassium: 4.4 mmol/L (ref 3.5–5.1)
Sodium: 134 mmol/L — ABNORMAL LOW (ref 135–145)

## 2022-08-25 LAB — GLUCOSE, CAPILLARY
Glucose-Capillary: 168 mg/dL — ABNORMAL HIGH (ref 70–99)
Glucose-Capillary: 190 mg/dL — ABNORMAL HIGH (ref 70–99)
Glucose-Capillary: 204 mg/dL — ABNORMAL HIGH (ref 70–99)
Glucose-Capillary: 177 mg/dL — ABNORMAL HIGH (ref 70–99)

## 2022-08-25 MED ORDER — AMOXICILLIN-POT CLAVULANATE 875-125 MG PO TABS
1.0000 | ORAL_TABLET | Freq: Two times a day (BID) | ORAL | Status: AC
  Administered 2022-08-25 – 2022-08-28 (×6): 1 via ORAL
  Filled 2022-08-25 (×7): qty 1

## 2022-08-25 MED ORDER — FUROSEMIDE 10 MG/ML IJ SOLN
40.0000 mg | Freq: Every day | INTRAMUSCULAR | Status: DC
  Administered 2022-08-26: 40 mg via INTRAVENOUS
  Filled 2022-08-25: qty 4

## 2022-08-25 NOTE — Hospital Course (Addendum)
81 y.o. male with past medical history significant of systolic CHF with EF of 40-45%, moderate aortic stenosis hypertension, diabetes mellitus, CAD with CABG and PVD, who presented to ED on 08/22/22 with shortness of breath and worsening leg edema.  Patient admitted for acute systolic heart failure.    Following admission, patient underwent debridement of his right foot by podiatry.  Patient underwent lower extremity angiogram with stent placement by vascular surgery patient's renal function worsened and nephrology consulted.  On 1/3, patient had chest pain with troponins peaking to 2500s.  Cardiology following, felt to be non-STEMI.  Felt to be secondary to decompensated heart failure despite doses of IV Lasix and started on Lasix drip.  Hospital course also complicated by development of RSV on 1/8.  Since hospitalization, patient has diuresed almost 7 L and is more than -5 L deficient and is down over 20 pounds from admission.  Patient went for right-sided heart catheterization 1/18 noting moderate to severe reduced cardiac index with mild elevated PA pressure.

## 2022-08-25 NOTE — H&P (View-Only) (Signed)
    Subjective  -   No complaints today   Physical Exam:  Pulmonary: Respirations nonlabored Abdomen: Nontender Vascular: I could not palpate a right femoral pulse.  He has a palpable left femoral pulse. Lower extremity dressings intact bilaterally   Assessment/Plan:   Vascular: The patient has nonhealing wounds on both legs.  The right is worse.  I was unable to palpate a right femoral pulse today.  I have recommended that he undergo angiography for arterial evaluation of both lower extremities.  This will be scheduled for Tuesday.  I discussed the risks and benefits of the procedure as well as the details and he wishes to proceed.  We will continue to monitor his renal function given that he will likely need dye for his angiogram  Bradley [name] 08/25/2022 5:49 PM --  Vitals:   08/25/22 1307 08/25/22 1507  BP: 118/63 (!) 104/55  Pulse: (!) 59 (!) 57  Resp: 19 18  Temp: 98.1 F (36.7 C) 98.6 F (37 C)  SpO2: 100% 98%    Intake/Output Summary (Last 24 hours) at 08/25/2022 1749 Last data filed at 08/25/2022 1507 Gross per 24 hour  Intake 371.23 ml  Output 1700 ml  Net -1328.77 ml     Laboratory CBC    Component Value Date/Time   WBC 10.0 08/24/2022 0356   HGB 10.0 (L) 08/24/2022 0356   HCT 33.7 (L) 08/24/2022 0356   PLT 247 08/24/2022 0356    BMET    Component Value Date/Time   NA 134 (L) 08/25/2022 1115   K 4.4 08/25/2022 1115   CL 101 08/25/2022 1115   CO2 25 08/25/2022 1115   GLUCOSE 208 (H) 08/25/2022 1115   BUN 61 (H) 08/25/2022 1115   CREATININE 1.99 (H) 08/25/2022 1115   CALCIUM 8.2 (L) 08/25/2022 1115   GFRNONAA 33 (L) 08/25/2022 1115   GFRAA >60 03/14/2018 0551    COAG Lab Results  Component Value Date   INR 1.2 03/11/2022   INR 1.0 08/05/2021   INR 1.0 07/09/2021   No results found for: "PTT"  Antibiotics Anti-infectives (From admission, onward)    Start     Dose/Rate Route Frequency Ordered Stop   08/25/22 1530   amoxicillin-clavulanate (AUGMENTIN) 875-125 MG per tablet 1 tablet        1 tablet Oral Every 12 hours 08/25/22 1440 08/29/22 0959   08/23/22 1600  cefTRIAXone (ROCEPHIN) 1 g in sodium chloride 0.9 % 100 mL IVPB  Status:  Discontinued        1 g 200 mL/hr over 30 Minutes Intravenous Every 24 hours 08/22/22 1752 08/25/22 1440   08/22/22 1545  cefTRIAXone (ROCEPHIN) 1 g in sodium chloride 0.9 % 100 mL IVPB        1 g 200 mL/hr over 30 Minutes Intravenous  Once 08/22/22 1531 08/22/22 1746        V. Bradley [name] IV, M.D., FACS [facility] Office: [phone] Pager:  [phone]  

## 2022-08-25 NOTE — Progress Notes (Addendum)
    Subjective  -   No complaints today   Physical Exam:  Pulmonary: Respirations nonlabored Abdomen: Nontender Vascular: I could not palpate a right femoral pulse.  Bradley [name] has a palpable left femoral pulse. Lower extremity dressings intact bilaterally   Assessment/Plan:   Vascular: The patient has nonhealing wounds on both legs.  The right is worse.  I was unable to palpate a right femoral pulse today.  I have recommended that Bradley [name] undergo angiography for arterial evaluation of both lower extremities.  This will be scheduled for Tuesday.  I discussed the risks and benefits of the procedure as well as the details and Bradley [name] wishes to proceed.  We will continue to monitor his renal function given that Bradley [name] will likely need dye for his angiogram  Bradley [name] 08/25/2022 5:49 PM --  Vitals:   08/25/22 1307 08/25/22 1507  BP: 118/63 (!) 104/55  Pulse: (!) 59 (!) 57  Resp: 19 18  Temp: 98.1 F (36.7 C) 98.6 F (37 C)  SpO2: 100% 98%    Intake/Output Summary (Last 24 hours) at 08/25/2022 1749 Last data filed at 08/25/2022 1507 Gross per 24 hour  Intake 371.23 ml  Output 1700 ml  Net -1328.77 ml     Laboratory CBC    Component Value Date/Time   WBC 10.0 08/24/2022 0356   HGB 10.0 (L) 08/24/2022 0356   HCT 33.7 (L) 08/24/2022 0356   PLT 247 08/24/2022 0356    BMET    Component Value Date/Time   NA 134 (L) 08/25/2022 1115   K 4.4 08/25/2022 1115   CL 101 08/25/2022 1115   CO2 25 08/25/2022 1115   GLUCOSE 208 (H) 08/25/2022 1115   BUN 61 (H) 08/25/2022 1115   CREATININE 1.99 (H) 08/25/2022 1115   CALCIUM 8.2 (L) 08/25/2022 1115   GFRNONAA 33 (L) 08/25/2022 1115   GFRAA >60 03/14/2018 0551    COAG Lab Results  Component Value Date   INR 1.2 03/11/2022   INR 1.0 08/05/2021   INR 1.0 07/09/2021   No results found for: "PTT"  Antibiotics Anti-infectives (From admission, onward)    Start     Dose/Rate Route Frequency Ordered Stop   08/25/22 1530   amoxicillin-clavulanate (AUGMENTIN) 875-125 MG per tablet 1 tablet        1 tablet Oral Every 12 hours 08/25/22 1440 08/29/22 0959   08/23/22 1600  cefTRIAXone (ROCEPHIN) 1 g in sodium chloride 0.9 % 100 mL IVPB  Status:  Discontinued        1 g 200 mL/hr over 30 Minutes Intravenous Every 24 hours 08/22/22 1752 08/25/22 1440   08/22/22 1545  cefTRIAXone (ROCEPHIN) 1 g in sodium chloride 0.9 % 100 mL IVPB        1 g 200 mL/hr over 30 Minutes Intravenous  Once 08/22/22 1531 08/22/22 1746        V. Charlena Cross, M.D., [facility] [place] Office: [phone] Pager:  [phone]

## 2022-08-25 NOTE — Progress Notes (Signed)
PROGRESS NOTE    Bradley GilbertJames Scoville   ZOX:[id number] DOB: [birth year]  DOA: 08/22/2022 Date of Service: 08/25/22 PCP: System, Provider Not In     Brief Narrative / Hospital Course:  Bradley [name] is a 81 y.o. 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 Patient stated that he has chronic shortness of breath, which has been progressively worsening in the past several weeks, much worse day of arrival to ED, assoc w/ bilateral leg edema and legs oozing fluid. According to the patient [facility] with concerns for leg cellulitis. 12/28: pt was found to have BNP > 4500,  WBC 11.1, potassium 5.7, worsening renal function w/ Cr 2.84, VSS 121/54, oxygen saturation 100% on room air.  Chest x-ray showed low volume without infiltration.  Patient was admitted to telemetry bed as inpatient for tx CHF 12/29: Cardiology consulted  12/30: Debridement R foot wound w/ podiatry. Vascular surgery planning angiography pending improvementin renal function. 12/31: renal function about same Cr 1.99. Net IO Since Admission: -4,208.77 mL [08/25/22 1516]  Consultants:  Vascular Surgery  Podiatry  Cardiology   Procedures: 12/30 podiatry team performed excisional debridement of the ulcerative areas on the right forefoot      ASSESSMENT & PLAN:   Principal Problem:   Acute on chronic combined systolic and diastolic CHF (congestive heart failure) (HCC) Active Problems:   Cellulitis of lower extremity   CAD (coronary artery disease)   Essential hypertension   Acute renal failure superimposed on stage 3a chronic kidney disease (HCC)   HLD (hyperlipidemia)   Type II diabetes mellitus with renal manifestations (HCC)   Paroxysmal atrial fibrillation (HCC)   Hyperkalemia   PVD (peripheral vascular disease)  (HCC)  Acute on chronic combined systolic and diastolic CHF (congestive heart failure) (HCC) 2D echo on 03/12/2022 showed EF of 40-45% with grade 2 diastolic dysfunction.  BNP> 4500. 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 Saline wet-to-dry gauze dressing applied to the right foot. Santyl ointment and moistened gauze dressing to the ulcerations on the right foot. Recommend vascular consultation  Vascular --> saw patient 12/30 and recommended that more than likely he will require angiography to make sure he has adequate blood flow for wound healing, will defer this for now hoping renal functio to improve and anticipate angiography next week pending his renal function.   Acute renal failure superimposed on stage 3a chronic kidney disease (HCC):  Baseline creatinine 1.38 on 03/14/2022.   On Admission, Cr 2.84 Today 08/25/22 Cr 1.99 Holding Entresto Avoid Nephrotoxic Medications, Contrast Dyes, Hypotension and Dehydration to Ensure Adequate Renal Perfusion and will need to Renally Adjust Meds Monitor renal labs daily  Caution w/ IV fluids given CHF/AS  Vascular  planning angiography if/when renal function improves May need to involve nephrology if worsening For now, ok to hold lasix a couple days and  monitor closely  CAD (coronary artery disease status post CABG and prior PCI as intermittent exertional chest pain. Lipitor, Plavix, imdur,  check A1C and FLP   Essential Hypertension IV hydralazine as needed Metoprolol Patient is on IV Lasix Continue monitor blood pressures per protocol   Moderate aortic stenosis Recent echo done showed moderate aortic stenosis at [facility] TAVR is not indicated at this time and cardiology is following   HLD (hyperlipidemia) Lipid Panel done and showed a total cholesterol/HDL ratio of 2.3, cholesterol level 175, HDL 33, LDL 17, triglycerides 125, VLDL 25 Statin Therapy with Atorvastatin   Type II diabetes mellitus with renal manifestations (HCC): Recent A1c 7.8.   Poorly controlled.   Patient taking glipizide and Jardiance at home which will be held Sliding scale insulin   Paroxysmal atrial fibrillation (HCC) s/p Cardioversion Eliquis Amiodarone 100 mg po daily and Metoprolol Succinate 50 mg po Daily    Hyperkalemia:  S/p Loklema Continue to Monitor and Trend and repeat CMP in the AM   PVD (peripheral vascular disease) (HCC) Follow-up ABI and showed "Right ABI is 0.58 and suggestive for moderate peripheral arterial disease. Normal left ABI, measuring 1.04" Plavix and Lipitor Angiography probably later this week    Normocytic Anemia Continue to Monitor for S/Sx of Bleeding; No overt bleeding noted monitor CBC    DVT prophylaxis: Eliquis  Pertinent IV fluids/nutrition: caution w/ IV fluids - none continuous at this time  Central lines / invasive devices: none   Code Status: DNR  Current Admission Status: inpatient   TOC needs / Dispo plan: none at this time Barriers to discharge / significant pending items: pending angiography which is pending improvement in renal function              Subjective / Brief ROS:  Patient reports no concerns today Denies CP/SOB worsening, some dyspnea/orthopnea Pain controlled.  Denies new  weakness.  Tolerating diet.  Reports no concerns w/ urination/defecation.   Family Communication: none at this time     Objective Findings:  Vitals:   08/25/22 0700 08/25/22 0800 08/25/22 1307 08/25/22 1507  BP:  110/62 118/63 (!) 104/55  Pulse:  62 (!) 59 (!) 57  Resp:  18 19 18   Temp:  97.8 F (36.6 C) 98.1 F (36.7 C) 98.6 F (37 C)  TempSrc:  Oral Oral Oral  SpO2:  97% 100% 98%  Weight: 84.9 kg     Height:        Intake/Output Summary (Last 24 hours) at 08/25/2022 1522 Last data filed at 08/25/2022 1507 Gross per 24 hour  Intake 371.23 ml  Output 1700 ml  Net -1328.77 ml   Filed Weights   08/22/22 1427 08/24/22 0751 08/25/22 0700  Weight: 85.7 kg 85.2 kg 84.9 kg    Examination:  Physical Exam Constitutional:      General: He is not in acute distress.    Appearance: Normal appearance.  Cardiovascular:     Rate and Rhythm: Normal rate and regular rhythm.     Heart sounds: Normal heart sounds.  Pulmonary:     Effort: Pulmonary effort is normal. No respiratory distress.     Breath sounds: Normal breath sounds.  Abdominal:     Tenderness: There is no abdominal tenderness.  Musculoskeletal:     Cervical back: No rigidity.     Comments: Did not unwrap  lower extremities (wrapped to ankles w/ ACE bandage) but no erythema / edema proximal to wrappings   Neurological:     General: No focal deficit present.     Mental Status: He is alert and oriented to person, place, and time.  Psychiatric:        Mood and Affect: Mood normal.        Behavior: Behavior normal.        Thought Content: Thought content normal.          Scheduled Medications:   amiodarone  100 mg Oral Daily   amoxicillin-clavulanate  1 tablet Oral Q12H   apixaban  2.5 mg Oral BID   atorvastatin  80 mg Oral QHS   brinzolamide  1 drop Both Eyes TID   And   brimonidine  1 drop Both Eyes TID   collagenase   Topical Daily   cyanocobalamin  1,000 mcg Oral Daily   [START ON 08/26/2022]  furosemide  40 mg Intravenous Daily   insulin aspart  0-5 Units Subcutaneous QHS   insulin aspart  0-9 Units Subcutaneous TID WC   isosorbide mononitrate  90 mg Oral Daily   metoprolol succinate  50 mg Oral Daily   pantoprazole  40 mg Oral Daily   timolol  1 drop Both Eyes QHS   Travoprost (BAK Free)  1 drop Both Eyes QHS    Continuous Infusions:  sodium chloride Stopped (08/24/22 1617)    PRN Medications:  sodium chloride, albuterol, dextromethorphan-guaiFENesin, HYDROcodone-acetaminophen, nitroGLYCERIN, polyethylene glycol  Antimicrobials from admission:  Anti-infectives (From admission, onward)    Start     Dose/Rate Route Frequency Ordered Stop   08/25/22 1530  amoxicillin-clavulanate (AUGMENTIN) 875-125 MG per tablet 1 tablet        1 tablet Oral Every 12 hours 08/25/22 1440 08/29/22 0959   08/23/22 1600  cefTRIAXone (ROCEPHIN) 1 g in sodium chloride 0.9 % 100 mL IVPB  Status:  Discontinued        1 g 200 mL/hr over 30 Minutes Intravenous Every 24 hours 08/22/22 1752 08/25/22 1440   08/22/22 1545  cefTRIAXone (ROCEPHIN) 1 g in sodium chloride 0.9 % 100 mL IVPB        1 g 200 mL/hr over 30 Minutes Intravenous  Once 08/22/22 1531 08/22/22 1746           Data Reviewed:  I have personally reviewed the following...  CBC: Recent Labs  Lab 08/22/22 1145 08/23/22 1138 08/24/22 0356  WBC 11.1* 10.3 10.0  NEUTROABS  --  7.4 6.8  HGB 10.6* 10.0* 10.0*  HCT 36.5* 33.7* 33.7*  MCV 98.6 98.3 94.7  PLT 260 208 247   Basic Metabolic Panel: Recent Labs  Lab 08/22/22 1145 08/23/22 0354 08/24/22 0356 08/25/22 1115  NA 138 138 138 134*  K 5.7* 5.3* 4.6 4.4  CL 106 107 106 101  CO2 GLUCOSE 241* 160* 126* 208*  BUN 73* 67* 58* 61*  CREATININE 2.84* 2.27* 1.95* 1.99*  CALCIUM 8.5* 8.6* 8.5* 8.2*  MG 3.1* 2.8* 2.5*  --   PHOS  --   --  4.0  --    GFR: Estimated Creatinine Clearance: 32 mL/min (A) (by C-G formula based on SCr of 1.99 mg/dL (H)). Liver  Function Tests: Recent Labs  Lab 08/24/22 0356  AST 16  ALT 33  ALKPHOS 180*  BILITOT 0.7  PROT 6.1*  ALBUMIN 3.0*   No results for input(s): "LIPASE", "AMYLASE" in the last  168 hours. No results for input(s): "AMMONIA" in the last 168 hours. Coagulation Profile: No results for input(s): "INR", "PROTIME" in the last 168 hours. Cardiac Enzymes: No results for input(s): "CKTOTAL", "CKMB", "CKMBINDEX", "TROPONINI" in the last 168 hours. BNP (last 3 results) No results for input(s): "PROBNP" in the last 8760 hours. HbA1C: Recent Labs    08/23/22 0354  HGBA1C 8.9*   CBG: Recent Labs  Lab 08/24/22 1559 08/24/22 2210 08/25/22 0733 08/25/22 1136 08/25/22 1504  GLUCAP 176* 226* 168* 190* 204*   Lipid Profile: Recent Labs    08/22/22 1605  CHOL 75  HDL 33*  LDLCALC 17  TRIG 622  CHOLHDL 2.3   Thyroid Function Tests: No results for input(s): "TSH", "T4TOTAL", "FREET4", "T3FREE", "THYROIDAB" in the last 72 hours. Anemia Panel: No results for input(s): "VITAMINB12", "FOLATE", "FERRITIN", "TIBC", "IRON", "RETICCTPCT" in the last 72 hours. Most Recent Urinalysis On File:  No results found for: "COLORURINE", "APPEARANCEUR", "LABSPEC", "PHURINE", "GLUCOSEU", "HGBUR", "BILIRUBINUR", "KETONESUR", "PROTEINUR", "UROBILINOGEN", "NITRITE", "LEUKOCYTESUR" Sepsis Labs: @LABRCNTIP (procalcitonin:4,lacticidven:4) Microbiology: Recent Results (from the past 240 hour(s))  Blood culture (routine x 2)     Status: None (Preliminary result)   Collection Time: 08/22/22  4:20 PM   Specimen: BLOOD  Result Value Ref Range Status   Specimen Description BLOOD BLOOD RIGHT WRIST  Final   Special Requests   Final    BOTTLES DRAWN AEROBIC AND ANAEROBIC Blood Culture adequate volume   Culture   Final    NO GROWTH 3 DAYS Performed at [facility], [address] [place], [place]    Report Status PENDING  Incomplete  Blood culture (routine x 2)     Status: None (Preliminary  result)   Collection Time: 08/22/22  4:20 PM   Specimen: BLOOD  Result Value Ref Range Status   Specimen Description BLOOD BLOOD RIGHT FOREARM  Final   Special Requests   Final    BOTTLES DRAWN AEROBIC AND ANAEROBIC Blood Culture adequate volume   Culture   Final    NO GROWTH 3 DAYS Performed at [facility], [address] [place], [place]    Report Status PENDING  Incomplete      Radiology Studies last 3 days: 92119 ARTERIAL ABI (SCREENING LOWER EXTREMITY)  Result Date: 08/23/2022 CLINICAL DATA:  Peripheral vascular disease. Right lower extremity rest pain. EXAM: NONINVASIVE PHYSIOLOGIC VASCULAR STUDY OF BILATERAL LOWER EXTREMITIES TECHNIQUE: Evaluation of both lower extremities were performed at rest, including calculation of ankle-brachial indices with single level Doppler, pressure and pulse volume recording. COMPARISON:  None Available. FINDINGS: Right ABI:  0.58 Left ABI:  1.04 Right Lower Extremity:  Irregular waveforms in the right ankle. Left Lower Extremity:  Irregular waveforms in the left ankle. 0.5-0.79 Moderate PAD IMPRESSION: 1. Right ABI is 0.58 and suggestive for moderate peripheral arterial disease. 2. Normal left ABI, measuring 1.04. Electronically Signed   By: 04[birth year]14[birth year]1983 M.D.   On: 08/23/2022 10:02   08/25/2022 Venous Img Lower Bilateral (DVT)  Result Date: 08/22/2022 CLINICAL DATA:  Cellulitis EXAM: BILATERAL LOWER EXTREMITY VENOUS DOPPLER ULTRASOUND TECHNIQUE: Gray-scale sonography with compression, as well as color and duplex ultrasound, were performed to evaluate the deep venous system(s) from the level of the common femoral vein through the popliteal and proximal calf veins. COMPARISON:  None Available. FINDINGS: VENOUS Normal compressibility of the common femoral, superficial femoral, and popliteal veins, as well as the visualized calf veins. Visualized portions of profunda femoral vein and great saphenous vein unremarkable. No  filling defects to suggest  DVT on grayscale or color Doppler imaging. Doppler waveforms show normal direction of venous flow, normal respiratory plasticity and response to augmentation. OTHER None. Limitations: none IMPRESSION: Negative. Electronically Signed   By: Charlett Nose M.D.   On: 08/22/2022 19:08   DG Chest 2 View  Result Date: 08/22/2022 CLINICAL DATA:  History of congestive heart failure with dyspnea on exertion and bilateral leg edema EXAM: CHEST - 2 VIEW COMPARISON:  Chest radiograph dated 03/12/2022 FINDINGS: Low lung volumes. No focal consolidations. Trace bilateral pleural effusions. No pneumothorax. Similar postsurgical cardiomediastinal silhouette. Median sternotomy wires are nondisplaced. Surgical clips project over the mediastinum. IMPRESSION: Low lung volumes with trace bilateral pleural effusions. No focal consolidations. Electronically Signed   By: Agustin Cree M.D.   On: 08/22/2022 12:12             LOS: 3 days       Sunnie Nielsen, DO [facility] 08/25/2022, 3:22 PM    Dictation software may have been used to generate the above note. Typos may occur and escape review in typed/dictated notes. Please contact Dr Lyn Hollingshead directly for clarity if needed.  Staff may message me via secure chat in Epic  but this may not receive an immediate response,  please page me for urgent matters!  If 7PM-7AM, please contact night coverage [email or web address]

## 2022-08-25 NOTE — Progress Notes (Signed)
Rounding Note    Patient Name: Bradley [name] Date of Encounter: 08/25/2022  [facility] Cardiologist: Julien Nordmann, MD   Subjective   C/o leg pain. Dyspnea better but still not quite at baseline.  Inpatient Medications    Scheduled Meds:  amiodarone  100 mg Oral Daily   apixaban  2.5 mg Oral BID   atorvastatin  80 mg Oral QHS   brinzolamide  1 drop Both Eyes TID   And   brimonidine  1 drop Both Eyes TID   collagenase   Topical Daily   cyanocobalamin  1,000 mcg Oral Daily   furosemide  40 mg Intravenous Q12H   insulin aspart  0-5 Units Subcutaneous QHS   insulin aspart  0-9 Units Subcutaneous TID WC   isosorbide mononitrate  90 mg Oral Daily   metoprolol succinate  50 mg Oral Daily   pantoprazole  40 mg Oral Daily   timolol  1 drop Both Eyes QHS   Travoprost (BAK Free)  1 drop Both Eyes QHS   Continuous Infusions:  sodium chloride Stopped (08/24/22 1617)   cefTRIAXone (ROCEPHIN)  IV Stopped (08/24/22 1538)   PRN Meds: sodium chloride, albuterol, dextromethorphan-guaiFENesin, HYDROcodone-acetaminophen, nitroGLYCERIN, polyethylene glycol   Vital Signs    Vitals:   08/25/22 0655 08/25/22 0700 08/25/22 0800 08/25/22 1307  BP: (!) 115/59  110/62 118/63  Pulse: 61  62 (!) 59  Resp:   18 19  Temp: 97.8 F (36.6 C)  97.8 F (36.6 C) 98.1 F (36.7 C)  TempSrc: Oral  Oral Oral  SpO2: 98%  97% 100%  Weight:  84.9 kg    Height:        Intake/Output Summary (Last 24 hours) at 08/25/2022 1404 Last data filed at 08/25/2022 1000 Gross per 24 hour  Intake 371.23 ml  Output 1050 ml  Net -678.77 ml      08/25/2022    7:00 AM 08/24/2022    7:51 AM 08/22/2022    2:27 PM  Last 3 Weights  Weight (lbs) 187 lb 1.6 oz 187 lb 12.8 oz 188 lb 15 oz  Weight (kg) 84.868 kg 85.186 kg 85.7 kg      Telemetry    Nsr/sb - Personally Reviewed  ECG    none - Personally Reviewed  Physical Exam   GEN: No acute distress.   Neck: No JVD Cardiac: RRR, no  murmurs, rubs, or gallops.  Respiratory: Clear to auscultation bilaterally. GI: Soft, nontender, non-distended  MS: min edema; No deformity. wrapped Neuro:  Nonfocal  Psych: Normal affect   Labs    High Sensitivity Troponin:   Recent Labs  Lab 08/22/22 1747 08/23/22 0354  TROPONINIHS 38* 35*     Chemistry Recent Labs  Lab 08/22/22 1145 08/23/22 0354 08/24/22 0356 08/25/22 1115  NA 138 138 138 134*  K 5.7* 5.3* 4.6 4.4  CL 106 107 106 101  CO2 24 22 24 25   GLUCOSE 241* 160* 126* 208*  BUN 73* 67* 58* 61*  CREATININE 2.84* 2.27* 1.95* 1.99*  CALCIUM 8.5* 8.6* 8.5* 8.2*  MG 3.1* 2.8* 2.5*  --   PROT  --   --  6.1*  --   ALBUMIN  --   --  3.0*  --   AST  --   --  16  --   ALT  --   --  33  --   ALKPHOS  --   --  180*  --   BILITOT  --   --  0.7  --   GFRNONAA 22* 28* 34* 33*  ANIONGAP 8 9 8 8     Lipids  Recent Labs  Lab 08/22/22 1605  CHOL 75  TRIG 125  HDL 33*  LDLCALC 17  CHOLHDL 2.3    Hematology Recent Labs  Lab 08/22/22 1145 08/23/22 1138 08/24/22 0356  WBC 11.1* 10.3 10.0  RBC 3.70* 3.43* 3.56*  HGB 10.6* 10.0* 10.0*  HCT 36.5* 33.7* 33.7*  MCV 98.6 98.3 94.7  MCH 28.6 29.2 28.1  MCHC 29.0* 29.7* 29.7*  RDW 20.9* 21.0* 20.6*  PLT 260 208 247   Thyroid No results for input(s): "TSH", "FREET4" in the last 168 hours.  BNP Recent Labs  Lab 08/22/22 1145  BNP >4,500.0*    DDimer No results for input(s): "DDIMER" in the last 168 hours.   Radiology    No results found.  Cardiac Studies   See above  Patient Profile     81 y.o. male admittted with acute diastolic CHF, also with peripheral vascular disease.  Assessment & Plan    HFpEF - he will continue lasix. His kidney function is unchanged. Note consideration for IV hydration. This can be done but will set 94 back in trying to get his lungs dry. Ok to stop the IV lasix if necessary for angiography.  Peripheral vascular disese - note Dr. [place] consult. He has a non-healing wound.       For questions or updates, please contact [facility] Please consult [email or web address] for contact info under        Signed, Coralee Pesa, MD  08/25/2022, 2:04 PM

## 2022-08-26 DIAGNOSIS — I480 Paroxysmal atrial fibrillation: Secondary | ICD-10-CM | POA: Diagnosis not present

## 2022-08-26 DIAGNOSIS — I5043 Acute on chronic combined systolic (congestive) and diastolic (congestive) heart failure: Secondary | ICD-10-CM | POA: Diagnosis not present

## 2022-08-26 DIAGNOSIS — L03119 Cellulitis of unspecified part of limb: Secondary | ICD-10-CM | POA: Diagnosis not present

## 2022-08-26 DIAGNOSIS — I739 Peripheral vascular disease, unspecified: Secondary | ICD-10-CM | POA: Diagnosis not present

## 2022-08-26 LAB — BASIC METABOLIC PANEL
Anion gap: 9 (ref 5–15)
BUN: 60 mg/dL — ABNORMAL HIGH (ref 8–23)
CO2: 24 mmol/L (ref 22–32)
Calcium: 8.6 mg/dL — ABNORMAL LOW (ref 8.9–10.3)
Chloride: 101 mmol/L (ref 98–111)
Creatinine, Ser: 1.78 mg/dL — ABNORMAL HIGH (ref 0.61–1.24)
GFR, Estimated: 38 mL/min — ABNORMAL LOW (ref >60)
Glucose, Bld: 191 mg/dL — ABNORMAL HIGH (ref 70–99)
Potassium: 4.2 mmol/L (ref 3.5–5.1)
Sodium: 134 mmol/L — ABNORMAL LOW (ref 135–145)

## 2022-08-26 LAB — GLUCOSE, CAPILLARY
Glucose-Capillary: 178 mg/dL — ABNORMAL HIGH (ref 70–99)
Glucose-Capillary: 181 mg/dL — ABNORMAL HIGH (ref 70–99)
Glucose-Capillary: 167 mg/dL — ABNORMAL HIGH (ref 70–99)
Glucose-Capillary: 188 mg/dL — ABNORMAL HIGH (ref 70–99)

## 2022-08-26 NOTE — Progress Notes (Signed)
PROGRESS NOTE    Tel Hevia   ZOX:[id number] DOB: October 01, 1940  DOA: 08/22/2022 Date of Service: 08/26/22 PCP: System, Provider Not In     Brief Narrative / Hospital Course:  Bradley [name] is a 82 y.o. 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 Patient stated that he has chronic shortness of breath, which has been progressively worsening in the past several weeks, much worse day of arrival to ED, assoc w/ bilateral leg edema and legs oozing fluid. According to the patient he was sent from the [place] to our hospital with concerns for leg cellulitis. 12/28: pt was found to have BNP > 4500,  WBC 11.1, potassium 5.7, worsening renal function w/ Cr 2.84, VSS 121/54, oxygen saturation 100% on room air.  Chest x-ray showed low volume without infiltration.  Patient was admitted to telemetry bed as inpatient for tx CHF 12/29: Cardiology consulted  12/30: Debridement R foot wound w/ podiatry. Vascular surgery planning angiography pending improvementin renal function. 12/31: renal function about same Cr 1.99. Net IO Since Admission: -4,208.77 mL [08/25/22 1516] 01/01: renal fxn improving to Cr 1.78. Angiography planned for tomorrow   Consultants:  Vascular Surgery  Podiatry  Cardiology   Procedures: 12/30 podiatry team performed excisional debridement of the ulcerative areas on the right forefoot      ASSESSMENT & PLAN:   Principal Problem:   Acute on chronic combined systolic and diastolic CHF (congestive heart failure) ([place]) Active Problems:   Cellulitis of lower extremity   CAD (coronary artery disease)   Essential hypertension   Acute renal failure superimposed on stage 3a chronic kidney disease (HCC)   HLD (hyperlipidemia)   Type II diabetes mellitus with renal manifestations (HCC)   Paroxysmal atrial  fibrillation (HCC)   Hyperkalemia   PVD (peripheral vascular disease) (HCC)  Acute on chronic combined systolic and diastolic CHF (congestive heart failure) ([place]) 2D echo on 03/12/2022 showed EF of 40-45% with grade 2 diastolic dysfunction.  BNP> 4500. 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 Saline wet-to-dry gauze dressing applied to the right foot. Santyl ointment and moistened gauze dressing to the ulcerations on the right foot. Recommend vascular consultation  Vascular --> saw patient 12/30 and recommended that more than likely he will require angiography to make sure he has adequate blood flow for wound healing, will defer this for now hoping renal functio to improve and anticipate angiography tomorrow 08/27/22  Acute renal failure superimposed on stage 3a chronic kidney disease ([place]):  Baseline creatinine 1.38 on 03/14/2022.   On Admission, Cr 2.84 08/25/22 Cr 1.99 Today 08/26/22 Cr 1.78 w/ GFR 38 Holding Entresto, spironolactone, Jardiance Monitor renal labs daily  Withholding  w/ IV fluids given CHF Vascular planning angiography tomorrow Lasix per cardiology   CAD (coronary artery  disease status post CABG and prior PCI as intermittent exertional chest pain. Lipitor, Plavix, imdur,  check A1C and FLP   Essential Hypertension IV hydralazine as needed Metoprolol Patient is  on IV Lasix Continue monitor blood pressures per protocol   Moderate aortic stenosis Recent echo done showed moderate aortic stenosis at [facility] TAVR is not indicated at this time and cardiology is following   HLD (hyperlipidemia) Lipid Panel done and showed a total cholesterol/HDL ratio of 2.3, cholesterol level 175, HDL 33, LDL 17, triglycerides 125, VLDL 25 Statin Therapy with Atorvastatin   Type II diabetes mellitus with renal manifestations ([place]): Recent A1c 7.8.   Poorly controlled.   Patient taking glipizide and Jardiance at home which will be held Sliding scale insulin   Paroxysmal atrial fibrillation (HCC) s/p Cardioversion Eliquis Amiodarone 100 mg po daily and Metoprolol Succinate 50 mg po Daily    Hyperkalemia:  S/p Loklema Continue to Monitor and Trend and repeat CMP in the AM   PVD (peripheral vascular disease) ([place]) Follow-up ABI and showed "Right ABI is 0.58 and suggestive for moderate peripheral arterial disease. Normal left ABI, measuring 1.04" Plavix and Lipitor Angiography tomorrow   Normocytic Anemia Continue to Monitor for S/Sx of Bleeding; No overt bleeding noted monitor CBC    DVT prophylaxis: Eliquis  Pertinent IV fluids/nutrition: avoiding IV fluids - none continuous at this time  Central lines / invasive devices: none   Code Status: DNR  Current Admission Status: inpatient   TOC needs / Dispo plan: none at this time Barriers to discharge / significant pending items: pending angiography tomorrow             Subjective / Brief ROS:  Patient reports no concerns today Denies CP/SOB. Pain controlled.  Denies new weakness.  Tolerating diet.  Reports no concerns w/ urination/defecation.   Family Communication: none at this time     Objective Findings:  Vitals:   08/26/22 0750 08/26/22 0935 08/26/22 1145 08/26/22 1255  BP: 136/63 131/64 (!) 128/57 (!) 129/59  Pulse: 60 62 (!) 58 63  Resp: 18 18 18 19   Temp: 97.8 F (36.6 C)    (!) 97.5 F (36.4 C)  TempSrc: Oral   Axillary  SpO2: 99% 99% 99% 99%  Weight:      Height:        Intake/Output Summary (Last 24 hours) at 08/26/2022 1307 Last data filed at 08/26/2022 1000 Gross per 24 hour  Intake 240 ml  Output 1800 ml  Net -1560 ml   Filed Weights   08/22/22 1427 08/24/22 0751 08/25/22 0700  Weight: 85.7 kg 85.2 kg 84.9 kg    Examination:  Physical Exam Constitutional:      General: He is not in acute distress.    Appearance: Normal appearance.  Cardiovascular:     Rate and Rhythm: Normal rate and regular rhythm.     Heart sounds: Normal heart sounds.  Pulmonary:     Effort: Pulmonary effort is normal. No respiratory distress.     Breath sounds: Normal breath sounds.  Abdominal:     Tenderness: There is no abdominal tenderness.  Musculoskeletal:     Cervical back: No rigidity.     Right lower leg: Edema (trace) present.     Left lower leg: Edema (trace) present.     Comments: Did not unwrap lower extremities (wrapped to ankles w/ ACE bandage) but no erythema / edema proximal to wrappings   Neurological:     General: No focal deficit  present.     Mental Status: He is alert and oriented to person, place, and time.  Psychiatric:        Mood and Affect: Mood normal.        Behavior: Behavior normal.        Thought Content: Thought content normal.          Scheduled Medications:   amiodarone  100 mg Oral Daily   amoxicillin-clavulanate  1 tablet Oral Q12H   apixaban  2.5 mg Oral BID   atorvastatin  80 mg Oral QHS   brinzolamide  1 drop Both Eyes TID   And   brimonidine  1 drop Both Eyes TID   collagenase   Topical Daily   cyanocobalamin  1,000 mcg Oral Daily   furosemide  40 mg Intravenous Daily   insulin aspart  0-5 Units Subcutaneous QHS   insulin aspart  0-9 Units Subcutaneous TID WC   isosorbide mononitrate  90 mg Oral Daily   metoprolol succinate  50 mg Oral Daily   pantoprazole  40 mg Oral Daily   timolol  1 drop Both Eyes QHS    Travoprost (BAK Free)  1 drop Both Eyes QHS    Continuous Infusions:  sodium chloride Stopped (08/24/22 1617)    PRN Medications:  sodium chloride, albuterol, dextromethorphan-guaiFENesin, HYDROcodone-acetaminophen, nitroGLYCERIN, polyethylene glycol  Antimicrobials from admission:  Anti-infectives (From admission, onward)    Start     Dose/Rate Route Frequency Ordered Stop   08/25/22 1530  amoxicillin-clavulanate (AUGMENTIN) 875-125 MG per tablet 1 tablet        1 tablet Oral Every 12 hours 08/25/22 1440 08/29/22 0959   08/23/22 1600  cefTRIAXone (ROCEPHIN) 1 g in sodium chloride 0.9 % 100 mL IVPB  Status:  Discontinued        1 g 200 mL/hr over 30 Minutes Intravenous Every 24 hours 08/22/22 1752 08/25/22 1440   08/22/22 1545  cefTRIAXone (ROCEPHIN) 1 g in sodium chloride 0.9 % 100 mL IVPB        1 g 200 mL/hr over 30 Minutes Intravenous  Once 08/22/22 1531 08/22/22 1746           Data Reviewed:  I have personally reviewed the following...  CBC: Recent Labs  Lab 08/22/22 1145 08/23/22 1138 08/24/22 0356  WBC 11.1* 10.3 10.0  NEUTROABS  --  7.4 6.8  HGB 10.6* 10.0* 10.0*  HCT 36.5* 33.7* 33.7*  MCV 98.6 98.3 94.7  PLT 260 208 247   Basic Metabolic Panel: Recent Labs  Lab 08/22/22 1145 08/23/22 0354 08/24/22 0356 08/25/22 1115 08/26/22 0458  NA 138 138 138 134* 134*  K 5.7* 5.3* 4.6 4.4 4.2  CL 106 107 106 101 101  CO2 24 22 24 25 24   GLUCOSE 241* 160* 126* 208* 191*  BUN 73* 67* 58* 61* 60*  CREATININE 2.84* 2.27* 1.95* 1.99* 1.78*  CALCIUM 8.5* 8.6* 8.5* 8.2* 8.6*  MG 3.1* 2.8* 2.5*  --   --   PHOS  --   --  4.0  --   --    GFR: Estimated Creatinine Clearance: 35.7 mL/min (A) (by C-G formula based on SCr of 1.78 mg/dL (H)). Liver Function Tests: Recent Labs  Lab 08/24/22 0356  AST 16  ALT 33  ALKPHOS 180*  BILITOT 0.7  PROT 6.1*  ALBUMIN 3.0*   No results for input(s): "LIPASE", "AMYLASE" in the last 168 hours. No results for input(s):  "AMMONIA" in the last 168 hours. Coagulation Profile:  No results for input(s): "INR", "PROTIME" in the last 168 hours. Cardiac Enzymes: No results for input(s): "CKTOTAL", "CKMB", "CKMBINDEX", "TROPONINI" in the last 168 hours. BNP (last 3 results) No results for input(s): "PROBNP" in the last 8760 hours. HbA1C: No results for input(s): "HGBA1C" in the last 72 hours.  CBG: Recent Labs  Lab 08/25/22 1136 08/25/22 1504 08/25/22 2107 08/26/22 0751 08/26/22 1142  GLUCAP 190* 204* 177* 178* 181*   Lipid Profile: No results for input(s): "CHOL", "HDL", "LDLCALC", "TRIG", "CHOLHDL", "LDLDIRECT" in the last 72 hours.  Thyroid Function Tests: No results for input(s): "TSH", "T4TOTAL", "FREET4", "T3FREE", "THYROIDAB" in the last 72 hours. Anemia Panel: No results for input(s): "VITAMINB12", "FOLATE", "FERRITIN", "TIBC", "IRON", "RETICCTPCT" in the last 72 hours. Most Recent Urinalysis On File:  No results found for: "COLORURINE", "APPEARANCEUR", "LABSPEC", "PHURINE", "GLUCOSEU", "HGBUR", "BILIRUBINUR", "KETONESUR", "PROTEINUR", "UROBILINOGEN", "NITRITE", "LEUKOCYTESUR" Sepsis Labs: @LABRCNTIP (procalcitonin:4,lacticidven:4) Microbiology: Recent Results (from the past 240 hour(s))  Blood culture (routine x 2)     Status: None (Preliminary result)   Collection Time: 08/22/22  4:20 PM   Specimen: BLOOD  Result Value Ref Range Status   Specimen Description BLOOD BLOOD RIGHT WRIST  Final   Special Requests   Final    BOTTLES DRAWN AEROBIC AND ANAEROBIC Blood Culture adequate volume   Culture   Final    NO GROWTH 4 DAYS Performed at [facility], [address] [place], [place]    Report Status PENDING  Incomplete  Blood culture (routine x 2)     Status: None (Preliminary result)   Collection Time: 08/22/22  4:20 PM   Specimen: BLOOD  Result Value Ref Range Status   Specimen Description BLOOD BLOOD RIGHT FOREARM  Final   Special Requests   Final    BOTTLES DRAWN  AEROBIC AND ANAEROBIC Blood Culture adequate volume   Culture   Final    NO GROWTH 4 DAYS Performed at [facility], [address] [place], [place]    Report Status PENDING  Incomplete      Radiology Studies last 3 days: [address] ARTERIAL ABI (SCREENING LOWER EXTREMITY)  Result Date: 08/23/2022 CLINICAL DATA:  Peripheral vascular disease. Right lower extremity rest pain. EXAM: NONINVASIVE PHYSIOLOGIC VASCULAR STUDY OF BILATERAL LOWER EXTREMITIES TECHNIQUE: Evaluation of both lower extremities were performed at rest, including calculation of ankle-brachial indices with single level Doppler, pressure and pulse volume recording. COMPARISON:  None Available. FINDINGS: Right ABI:  0.58 Left ABI:  1.04 Right Lower Extremity:  Irregular waveforms in the right ankle. Left Lower Extremity:  Irregular waveforms in the left ankle. 0.5-0.79 Moderate PAD IMPRESSION: 1. Right ABI is 0.58 and suggestive for moderate peripheral arterial disease. 2. Normal left ABI, measuring 1.04. Electronically Signed   By: 04-14-1983 M.D.   On: 08/23/2022 10:02   08/25/2022 Venous Img Lower Bilateral (DVT)  Result Date: 08/22/2022 CLINICAL DATA:  Cellulitis EXAM: BILATERAL LOWER EXTREMITY VENOUS DOPPLER ULTRASOUND TECHNIQUE: Gray-scale sonography with compression, as well as color and duplex ultrasound, were performed to evaluate the deep venous system(s) from the level of the common femoral vein through the popliteal and proximal calf veins. COMPARISON:  None Available. FINDINGS: VENOUS Normal compressibility of the common femoral, superficial femoral, and popliteal veins, as well as the visualized calf veins. Visualized portions of profunda femoral vein and great saphenous vein unremarkable. No filling defects to suggest DVT on grayscale or color Doppler imaging. Doppler waveforms show normal direction of venous flow, normal respiratory plasticity and  response to augmentation. OTHER None. Limitations: none IMPRESSION:  Negative. Electronically Signed   By: Charlett Nose M.D.   On: 08/22/2022 19:08             LOS: 4 days       Sunnie Nielsen, DO [facility] 08/26/2022, 1:07 PM    Dictation software may have been used to generate the above note. Typos may occur and escape review in typed/dictated notes. Please contact Dr Lyn Hollingshead directly for clarity if needed.  Staff may message me via secure chat in Epic  but this may not receive an immediate response,  please page me for urgent matters!  If 7PM-7AM, please contact night coverage [email or web address]

## 2022-08-26 NOTE — Progress Notes (Signed)
Rounding Note    Patient Name: Bradley [name] Date of Encounter: 08/26/2022  [place] Cardiologist: Ida Rogue, MD   Subjective   UOP -1.6L. The patient reports shortness of breath overnight. No chest pain.   Inpatient Medications    Scheduled Meds:  amiodarone  100 mg Oral Daily   amoxicillin-clavulanate  1 tablet Oral Q12H   apixaban  2.5 mg Oral BID   atorvastatin  80 mg Oral QHS   brinzolamide  1 drop Both Eyes TID   And   brimonidine  1 drop Both Eyes TID   collagenase   Topical Daily   cyanocobalamin  1,000 mcg Oral Daily   furosemide  40 mg Intravenous Daily   insulin aspart  0-5 Units Subcutaneous QHS   insulin aspart  0-9 Units Subcutaneous TID WC   isosorbide mononitrate  90 mg Oral Daily   metoprolol succinate  50 mg Oral Daily   pantoprazole  40 mg Oral Daily   timolol  1 drop Both Eyes QHS   Travoprost (BAK Free)  1 drop Both Eyes QHS   Continuous Infusions:  sodium chloride Stopped (08/24/22 1617)   PRN Meds: sodium chloride, albuterol, dextromethorphan-guaiFENesin, HYDROcodone-acetaminophen, nitroGLYCERIN, polyethylene glycol   Vital Signs    Vitals:   08/25/22 2109 08/25/22 2347 08/26/22 0400 08/26/22 0750  BP: 112/61 (!) 114/58 129/62 136/63  Pulse: (!) 58 60 60 60  Resp: 20 19 20 18   Temp: 98.5 F (36.9 C) 98.6 F (37 C) 98.2 F (36.8 C) 97.8 F (36.6 C)  TempSrc: Oral Oral Oral Oral  SpO2: 98% 95% 99% 99%  Weight:      Height:        Intake/Output Summary (Last 24 hours) at 08/26/2022 0923 Last data filed at 08/26/2022 0500 Gross per 24 hour  Intake 720 ml  Output 1650 ml  Net -930 ml      08/25/2022    7:00 AM 08/24/2022    7:51 AM 08/22/2022    2:27 PM  Last 3 Weights  Weight (lbs) 187 lb 1.6 oz 187 lb 12.8 oz 188 lb 15 oz  Weight (kg) 84.868 kg 85.186 kg 85.7 kg      Telemetry    SR/SB HR 50-60s - Personally Reviewed  ECG    No new - Personally Reviewed  Physical Exam   GEN: No acute distress.    Neck: No JVD Cardiac: RRR, no murmurs, rubs, or gallops.  Respiratory: Clear to auscultation bilaterally. GI: Soft, nontender, non-distended  MS: 1+ lower leg edema with wraps; No deformity. Neuro:  Nonfocal  Psych: Normal affect   Labs    High Sensitivity Troponin:   Recent Labs  Lab 08/22/22 1747 08/23/22 0354  TROPONINIHS 38* 35*     Chemistry Recent Labs  Lab 08/22/22 1145 08/23/22 0354 08/24/22 0356 08/25/22 1115 08/26/22 0458  NA 138 138 138 134* 134*  K 5.7* 5.3* 4.6 4.4 4.2  CL 106 107 106 101 101  CO2 24 22 24 25 24   GLUCOSE 241* 160* 126* 208* 191*  BUN 73* 67* 58* 61* 60*  CREATININE 2.84* 2.27* 1.95* 1.99* 1.78*  CALCIUM 8.5* 8.6* 8.5* 8.2* 8.6*  MG 3.1* 2.8* 2.5*  --   --   PROT  --   --  6.1*  --   --   ALBUMIN  --   --  3.0*  --   --   AST  --   --  16  --   --  ALT  --   --  33  --   --   ALKPHOS  --   --  180*  --   --   BILITOT  --   --  0.7  --   --   GFRNONAA 22* 28* 34* 33* 38*  ANIONGAP 8 9 8 8 9     Lipids  Recent Labs  Lab 08/22/22 1605  CHOL 75  TRIG 125  HDL 33*  LDLCALC 17  CHOLHDL 2.3    Hematology Recent Labs  Lab 08/22/22 1145 08/23/22 1138 08/24/22 0356  WBC 11.1* 10.3 10.0  RBC 3.70* 3.43* 3.56*  HGB 10.6* 10.0* 10.0*  HCT 36.5* 33.7* 33.7*  MCV 98.6 98.3 94.7  MCH 28.6 29.2 28.1  MCHC 29.0* 29.7* 29.7*  RDW 20.9* 21.0* 20.6*  PLT 260 208 247   Thyroid No results for input(s): "TSH", "FREET4" in the last 168 hours.  BNP Recent Labs  Lab 08/22/22 1145  BNP >4,500.0*    DDimer No results for input(s): "DDIMER" in the last 168 hours.   Radiology    No results found.  Cardiac Studies     Cardiac cath 08/04/22 1. Known native 3VD (RCA not injected, known CTO)  2. Patent LIMA-LAD  3. Patent free RIMA-OM3  4. SVG-RPL with distal 99% lesion on hairpin turn adjacent to distal anastomosis (unchanged compared with Jan 2023 cath)   Recommendations:   1. D/C left radial TR band per protocol  2.  Unchanged coronary anatomy compared with Jan 2023 cath.  The SVG-RPL lesion was attempted previously unsuccessfully.  Given new symptoms, unlikely that this is a culprit for current CP.  3. Continue work-up/management for LFLG aortic stenosis.    Echo 08/05/22 INTERPRETATION ---------------------------------------------------------------    INDETERMINATE.   VALVULAR REGURGITATION: MODERATE AR   VALVULAR STENOSIS: SEVERE AS   Note: LOW-DOSE DOBUTAMINE TO ASSESS AORTIC VALVE GRADIENTS   REST: Peak Vel=2.40m/sec; 14mmHg peak, 66mmHg mean; V2 = 55cm; V1 = 17cm;   LVOT Vel=0.53m/sec;  DI = 0.31; AVA=1.0cm2; SVI = 26.16ml/m2    30mcg: Peak Vel=2.7m/sec; 84mmHg peak, 10mmHg mean; V2 = 59cm; V1 =   12cm;LVOT Vel= 0.6m/sec;  DI = 0.20; AVA=0.6cm2; SVI = 18.57ml/m2    89mcg: Peak Vel=2.47m/sec; 3mmHg peak, 109mmHg mean; V2 = 59cm; V1 =   18cm;LVOT Vel= 0.50m/sec;  DI = 0.30; AVA=1.0cm2; SVI = 27.77ml/m2    47mcg: Peak Vel=2.53m/sec; 35.36mmHg peak, 67mmHg mean; V2 = 55cm; V1 =   11cm;LVOT Vel= 0.24m/sec;  DI = 0.20; AVA=0.6cm2; SVI = 17.65ml/m2    26mcg: Peak Vel=3.40m/sec; 65mmHg peak, 46mmHg mean; V2 = 54cm; V1 =   12cm;LVOT Vel= 0.36m/sec;  DI = 0.22; AVA=0.7cm2; SVI = 18.8ml/m2    MILD TO MODERATE AORTIC REGURGITATION AT REST    NO CHANGE IN LV FUNCTION/STROKE VOLUME WITH DOBUTAMINE   MINIMAL INCREASE IN MEAN/PEAK GRADIENT WITH DOBUTAMINE; MEAN GRADIENT   INCREASED FROM 19 TO 39mmHg   Maximum workload of  1.00 METs was achieved during exercise.   NORMAL RESTING BP - BLUNTED RESPONSE   Echocardiogram completed on 03/12/2022 1. Left ventricular ejection fraction, by estimation, is 40 to 45%. The  left ventricle has mildly decreased function. The left ventricle  demonstrates regional wall motion abnormalities (see scoring  diagram/findings for description). There is moderate  left ventricular hypertrophy. Left ventricular diastolic parameters are  consistent with Grade II diastolic  dysfunction (pseudonormalization).  Elevated left atrial pressure. There is severe hypokinesis of the left  ventricular, basal-mid inferoseptal  wall  and inferior wall.   2. Right ventricular systolic function is mildly reduced. The right  ventricular size is normal.   3. Left atrial size was mildly dilated.   4. Right atrial size was mildly dilated.   5. The mitral valve is degenerative. Mild mitral valve regurgitation.   6. The aortic valve is tricuspid. There is moderate calcification of the  aortic valve. There is moderate thickening of the aortic valve. Aortic  valve regurgitation is mild. 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  While the mean gradient is only mildly   elevated, the abnormal valve area and dimensionless index calculations  suggest at least moderate to severe low-flow:low-gradient aortic stenosis.  Visual assessment of leaflet movement suggests that aortic stenosis is not  critical.   7. The inferior vena cava is normal in size with <50% respiratory  variability, suggesting right atrial pressure of 8 mmHg.   Patient Profile     82 y.o. 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 - patient presented with lower leg edema, suspected cellulitis and SOB found to have BNP>4500 - second admission this month for heart failure - Echo from November 2022 showed LVEF 30-35% - Echo July 2023 showed LVEF 40-45% - Echo 08/02/22 showed mild LV dysfunction, mild LVH, moderate AS - he has mod to severe AS, which may be contributing to symptoms - he reports compliance with lasix 40mg  daily at home - PTA spironolactone 12.5mg  daily, Jardiance 25mg  daily, Entresto 24-26mg BID, and Toprol 50mg  daily - Toprol continued, all other meds held for AKI - In the ER he was given IV lasix 80mg   in the and started on IV lasix 40mg  BID - Net -4.2L - continue to monitor daily weights, strict I/Os, and kidney function   Cellulitis of lower extremity - abx per IM - DVT study negative   Moderate to Severe AS - Echo from July showed LVEF 40-45%, mod LVH, G2DD, mild MR, mod to severe AS, low-flow low gradient aortic stenosis - stress echo at [facility] showed mild LV dysfunction with mild LVH, mild RV systolic dysfunction, moderate AS and mild to mod AS, gradient increased from 19-27mmHg.  Recommended low-dose dobutamine to assess aortic valve gradients. It was disease was moderate and TAVR was not indicated at the time.    CAD s/p CABG and prior PCI - recent cath at [facility] showing unchanged anatomy - HS trop elevated with flat trend - no chest pain reported - continue Lipitor 80mg  daily, Toprol 50mg  daily, Imdur 60mg  daily - no further ischemic work-up at this time   HTN - BP is good, continue current medications   Acute on chronic renal failure CKD stage 3 - Scr 1.3-1.5 at baseline - Scr at discharge at [facility] was 1.8-1.9 - on this admission Scr 2.84, BUN 73 - improving with diuresis   HLD - continue statin therapy   Persistent Afib s/p prior cardioversion - remains in SB - continue amiodarone 100mg  daily - Toprol 50mg  daily - continue Eliquis 4.5mg  BID   Hyperkalemia - s/p lokelma - resolved  PVD - vascular following> planning for lower extremity angiography tomorrow  For questions or updates, please contact [place] Please consult [email or web address] for contact info under  Signed, Kolbee Bogusz Ninfa Meeker, PA-C  08/26/2022, 9:23 AM

## 2022-08-27 ENCOUNTER — Encounter
Admission: EM | Disposition: A | Discharge status: Home Health | Payer: Self-pay | Source: Home / Self Care | Attending: Internal Medicine

## 2022-08-27 ENCOUNTER — Encounter: Payer: Self-pay | Admitting: Vascular Surgery

## 2022-08-27 DIAGNOSIS — A419 Sepsis, unspecified organism: Secondary | ICD-10-CM

## 2022-08-27 DIAGNOSIS — L97419 Non-pressure chronic ulcer of right heel and midfoot with unspecified severity: Secondary | ICD-10-CM

## 2022-08-27 DIAGNOSIS — I739 Peripheral vascular disease, unspecified: Secondary | ICD-10-CM | POA: Diagnosis not present

## 2022-08-27 DIAGNOSIS — L089 Local infection of the skin and subcutaneous tissue, unspecified: Secondary | ICD-10-CM

## 2022-08-27 DIAGNOSIS — I70233 Atherosclerosis of native arteries of right leg with ulceration of ankle: Secondary | ICD-10-CM

## 2022-08-27 DIAGNOSIS — I4819 Other persistent atrial fibrillation: Secondary | ICD-10-CM | POA: Diagnosis not present

## 2022-08-27 DIAGNOSIS — I5043 Acute on chronic combined systolic (congestive) and diastolic (congestive) heart failure: Secondary | ICD-10-CM | POA: Diagnosis not present

## 2022-08-27 DIAGNOSIS — L97319 Non-pressure chronic ulcer of right ankle with unspecified severity: Secondary | ICD-10-CM

## 2022-08-27 HISTORY — PX: ABDOMINAL AORTOGRAM W/LOWER EXTREMITY: CATH118223

## 2022-08-27 LAB — BASIC METABOLIC PANEL
Anion gap: 8 (ref 5–15)
BUN: 50 mg/dL — ABNORMAL HIGH (ref 8–23)
CO2: 25 mmol/L (ref 22–32)
Calcium: 8.8 mg/dL — ABNORMAL LOW (ref 8.9–10.3)
Chloride: 103 mmol/L (ref 98–111)
Creatinine, Ser: 1.58 mg/dL — ABNORMAL HIGH (ref 0.61–1.24)
GFR, Estimated: 44 mL/min — ABNORMAL LOW (ref >60)
Glucose, Bld: 185 mg/dL — ABNORMAL HIGH (ref 70–99)
Potassium: 5 mmol/L (ref 3.5–5.1)
Sodium: 136 mmol/L (ref 135–145)

## 2022-08-27 LAB — CULTURE, BLOOD (ROUTINE X 2)
Culture: NO GROWTH
Culture: NO GROWTH
Special Requests: ADEQUATE
Special Requests: ADEQUATE

## 2022-08-27 LAB — GLUCOSE, CAPILLARY
Glucose-Capillary: 143 mg/dL — ABNORMAL HIGH (ref 70–99)
Glucose-Capillary: 147 mg/dL — ABNORMAL HIGH (ref 70–99)
Glucose-Capillary: 215 mg/dL — ABNORMAL HIGH (ref 70–99)
Glucose-Capillary: 136 mg/dL — ABNORMAL HIGH (ref 70–99)

## 2022-08-27 SURGERY — ABDOMINAL AORTOGRAM W/LOWER EXTREMITY
Anesthesia: IV Sedation (MBSC Only)

## 2022-08-27 MED ORDER — MORPHINE SULFATE (PF) 4 MG/ML IV SOLN
2.0000 mg | INTRAVENOUS | Status: DC | PRN
  Administered 2022-08-28 – 2022-08-30 (×2): 2 mg via INTRAVENOUS
  Filled 2022-08-27 (×3): qty 1

## 2022-08-27 MED ORDER — ACETAMINOPHEN 325 MG PO TABS: 650.0000 mg | ORAL_TABLET | ORAL | Status: DC | PRN

## 2022-08-27 MED ORDER — MIDAZOLAM HCL 5 MG/5ML IJ SOLN
INTRAMUSCULAR | Status: AC
  Filled 2022-08-27: qty 5

## 2022-08-27 MED ORDER — ONDANSETRON HCL 4 MG/2ML IJ SOLN
4.0000 mg | Freq: Four times a day (QID) | INTRAMUSCULAR | Status: DC | PRN
  Administered 2022-09-02: 4 mg via INTRAVENOUS
  Filled 2022-08-27: qty 2

## 2022-08-27 MED ORDER — MIDAZOLAM HCL 2 MG/2ML IJ SOLN
INTRAMUSCULAR | Status: DC | PRN
  Administered 2022-08-27: 2 mg via INTRAVENOUS

## 2022-08-27 MED ORDER — IODIXANOL 320 MG/ML IV SOLN
INTRAVENOUS | Status: DC | PRN
  Administered 2022-08-27: 50 mL via INTRA_ARTERIAL

## 2022-08-27 MED ORDER — SODIUM CHLORIDE 0.9 % IV SOLN: INTRAVENOUS | Status: DC

## 2022-08-27 MED ORDER — CEFAZOLIN SODIUM-DEXTROSE 1-4 GM/50ML-% IV SOLN
INTRAVENOUS | Status: AC
  Filled 2022-08-27: qty 50

## 2022-08-27 MED ORDER — SODIUM CHLORIDE 0.9% FLUSH
3.0000 mL | Freq: Two times a day (BID) | INTRAVENOUS | Status: DC
  Administered 2022-08-28 – 2022-08-30 (×5): 3 mL via INTRAVENOUS

## 2022-08-27 MED ORDER — HEPARIN SODIUM (PORCINE) 1000 UNIT/ML IJ SOLN
INTRAMUSCULAR | Status: DC | PRN
  Administered 2022-08-27: 5000 [IU] via INTRAVENOUS

## 2022-08-27 MED ORDER — OXYCODONE HCL 5 MG PO TABS
5.0000 mg | ORAL_TABLET | ORAL | Status: DC | PRN
  Administered 2022-08-29: 5 mg via ORAL
  Administered 2022-08-30: 10 mg via ORAL
  Administered 2022-08-30: 5 mg via ORAL
  Administered 2022-08-31 – 2022-09-03 (×7): 10 mg via ORAL
  Filled 2022-08-27 (×2): qty 2
  Filled 2022-08-27: qty 1
  Filled 2022-08-27 (×5): qty 2
  Filled 2022-08-27: qty 1
  Filled 2022-08-27: qty 2

## 2022-08-27 MED ORDER — CEFAZOLIN SODIUM-DEXTROSE 1-4 GM/50ML-% IV SOLN
INTRAVENOUS | Status: DC | PRN
  Administered 2022-08-27: 1 g via INTRAVENOUS

## 2022-08-27 MED ORDER — HEPARIN SODIUM (PORCINE) 1000 UNIT/ML IJ SOLN
INTRAMUSCULAR | Status: AC
  Filled 2022-08-27: qty 10

## 2022-08-27 MED ORDER — SODIUM CHLORIDE 0.9 % IV SOLN: 250.0000 mL | INTRAVENOUS | Status: DC | PRN

## 2022-08-27 MED ORDER — SODIUM CHLORIDE 0.9 % IV SOLN: INTRAVENOUS | Status: AC

## 2022-08-27 MED ORDER — SODIUM CHLORIDE 0.9% FLUSH: 3.0000 mL | INTRAVENOUS | Status: DC | PRN

## 2022-08-27 MED ORDER — FENTANYL CITRATE (PF) 100 MCG/2ML IJ SOLN
INTRAMUSCULAR | Status: AC
  Filled 2022-08-27: qty 2

## 2022-08-27 MED ORDER — ASPIRIN 81 MG PO TBEC
81.0000 mg | DELAYED_RELEASE_TABLET | Freq: Every day | ORAL | Status: DC
  Administered 2022-08-27 – 2022-09-16 (×21): 81 mg via ORAL
  Filled 2022-08-27 (×21): qty 1

## 2022-08-27 MED ORDER — CEFAZOLIN SODIUM-DEXTROSE 1-4 GM/50ML-% IV SOLN: 1.0000 g | Freq: Once | INTRAVENOUS | Status: DC

## 2022-08-27 MED ORDER — FENTANYL CITRATE (PF) 100 MCG/2ML IJ SOLN
INTRAMUSCULAR | Status: DC | PRN
  Administered 2022-08-27: 50 ug via INTRAVENOUS

## 2022-08-27 SURGICAL SUPPLY — 24 items
BALLN LUTONIX DCB 7X40X130 (BALLOONS) ×1
BALLOON LUTONIX DCB 7X40X130 (BALLOONS) IMPLANT
CATH BEACON 5 .035 65 KMP TIP (CATHETERS) IMPLANT
CATH BEACON 5 .038 100 VERT TP (CATHETERS) IMPLANT
CATH NAVICROSS ANGLED 135CM (MICROCATHETER) IMPLANT
CATH OMNI 2 5FR 80CM SOS (CATHETERS) IMPLANT
CATH SEEKER .035X150CM (CATHETERS) IMPLANT
COVER DRAPE FLUORO 36X44 (DRAPES) IMPLANT
COVER PROBE ULTRASOUND 5X96 (MISCELLANEOUS) IMPLANT
DEVICE STARCLOSE SE CLOSURE (Vascular Products) IMPLANT
DEVICE TORQUE .025-.038 (MISCELLANEOUS) IMPLANT
GLIDEWIRE ADV .035X260CM (WIRE) IMPLANT
GLIDEWIRE ANGLED SS 035X260CM (WIRE) IMPLANT
KIT ENCORE 26 ADVANTAGE (KITS) IMPLANT
NDL ENTRY 21GA 7CM ECHOTIP (NEEDLE) IMPLANT
NEEDLE ENTRY 21GA 7CM ECHOTIP (NEEDLE) ×1 IMPLANT
PACK ANGIOGRAPHY (CUSTOM PROCEDURE TRAY) IMPLANT
SET INTRO CAPELLA COAXIAL (SET/KITS/TRAYS/PACK) IMPLANT
SHEATH ANL2 6FRX45 HC (SHEATH) IMPLANT
SHEATH BRITE TIP 5FRX11 (SHEATH) IMPLANT
STENT LIFESTAR 9X40 (Permanent Stent) IMPLANT
SYR MEDRAD MARK 7 150ML (SYRINGE) IMPLANT
TUBING CONTRAST HIGH PRESS 72 (TUBING) IMPLANT
WIRE GUIDERIGHT .035X150 (WIRE) IMPLANT

## 2022-08-27 NOTE — Interval H&P Note (Signed)
History and Physical Interval Note:  08/27/2022 10:07 AM  Bradley [name]  has presented today for surgery, with the diagnosis of lower extremity ulcers.  The various methods of treatment have been discussed with the patient and family. After consideration of risks, benefits and other options for treatment, the patient has consented to  Procedure(s): ABDOMINAL AORTOGRAM W/LOWER EXTREMITY (N/A) as a surgical intervention.  The patient's history has been reviewed, patient examined, no change in status, stable for surgery.  I have reviewed the patient's chart and labs.  Questions were answered to the patient's satisfaction.     Hortencia Pilar

## 2022-08-27 NOTE — Progress Notes (Signed)
PROGRESS NOTE    Dakari Irvan   [id number] DOB: 19-Apr-1941  DOA: 08/22/2022 Date of Service: 08/27/22 PCP: System, Provider Not In     Brief Narrative / Hospital Course:  Jelon Ondo is a 82 y.o. 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 Patient stated that he has chronic shortness of breath, which has been progressively worsening in the past several weeks, much worse day of arrival to ED, assoc w/ bilateral leg edema and legs oozing fluid. According to the patient he was sent from the [place] to our hospital with concerns for leg cellulitis. 12/28: pt was found to have BNP > 4500,  WBC 11.1, potassium 5.7, worsening renal function w/ Cr 2.84, VSS 121/54, oxygen saturation 100% on room air.  Chest x-ray showed low volume without infiltration.  Patient was admitted to telemetry bed as inpatient for tx CHF 12/29: Cardiology consulted  12/30: Debridement R foot wound w/ podiatry. Vascular surgery planning angiography pending improvementin renal function. 12/31: renal function about same Cr 1.99. Net IO Since Admission: -4,208.77 mL [08/25/22 1516] 01/01: renal fxn improving to Cr 1.78. Angiography planned for tomorrow  01/02: LE angiography w/ stent to R external iliac artery. Consideration for right femoral to above-knee popliteal artery bypass will be discussed w/ patient   Consultants:  Vascular Surgery  Podiatry  Cardiology   Procedures: 08/24/22 podiatry performed excisional debridement of the ulcerative areas on the right forefoot 08/27/22 vascular surgery performed LE angiography w/ stent to R external iliac artery       ASSESSMENT & PLAN:   Principal Problem:   Acute on chronic combined systolic and diastolic CHF (congestive heart failure) (HCC) Active Problems:   Cellulitis of lower  extremity   CAD (coronary artery disease)   Essential hypertension   Acute renal failure superimposed on stage 3a chronic kidney disease (HCC)   HLD (hyperlipidemia)   Type II diabetes mellitus with renal manifestations (HCC)   Paroxysmal atrial fibrillation (HCC)   Hyperkalemia   PVD (peripheral vascular disease) (HCC)  Acute on chronic combined systolic and diastolic CHF (congestive heart failure) ([place]) 2D echo on 03/12/2022 showed EF of 40-45% with grade 2 diastolic dysfunction.  BNP> 4500. 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 Saline wet-to-dry gauze dressing applied to the right foot. Santyl ointment and moistened gauze dressing to the ulcerations on the right foot. Recommend vascular consultation  Vascular --> saw patient 12/30, angiography and stent RLE 01/02, Consideration for right femoral to above-knee popliteal artery bypass   PVD (peripheral vascular disease) ([place]) S/p LE angiography w/ stent to R external iliac artery.  Plavix and Lipitor Consideration for right femoral to above-knee popliteal artery bypass  Acute renal failure superimposed on stage 3a chronic kidney disease ([place]):  Baseline creatinine 1.38  on 03/14/2022.   On Admission, Cr 2.84 08/25/22 Cr 1.99 Today 08/27/22 Cr improved to 1.58 Holding Entresto, spironolactone, Jardiance Monitor renal labs daily   Withholding  w/ IV fluids given CHF Vascular planning angiography tomorrow Lasix per cardiology   CAD (coronary artery disease status post CABG and prior PCI as intermittent exertional chest pain. Lipitor, Plavix, imdur,  check A1C and FLP   Essential Hypertension IV hydralazine as needed Metoprolol Patient is on IV Lasix Continue monitor blood pressures per protocol   Moderate aortic stenosis Recent echo done showed moderate aortic stenosis at [facility] TAVR is not indicated at this time and cardiology is following   HLD (hyperlipidemia) Lipid Panel done and showed a total cholesterol/HDL ratio of 2.3, cholesterol level 175, HDL 33, LDL 17, triglycerides 125, VLDL 25 Statin Therapy with Atorvastatin   Type II diabetes mellitus with renal manifestations ([place]): Recent A1c 7.8.   Poorly controlled.   Patient taking glipizide and Jardiance at home which will be held Sliding scale insulin   Paroxysmal atrial fibrillation (HCC) s/p Cardioversion Eliquis Amiodarone 100 mg po daily and Metoprolol Succinate 50 mg po Daily    Hyperkalemia:  S/p Loklema Continue to Monitor and Trend and repeat CMP in the AM   Normocytic Anemia Continue to Monitor for S/Sx of Bleeding; No overt bleeding noted monitor CBC    DVT prophylaxis: Eliquis  Pertinent IV fluids/nutrition: avoiding IV fluids - none continuous at this time  Central lines / invasive devices: none   Code Status: DNR  Current Admission Status: inpatient   TOC needs / Dispo plan: none at this time Barriers to discharge / significant pending items: pending vascular clearance, possible popliteal artery bypass              Subjective / Brief ROS:  Patient reports no concerns today Denies CP/SOB. Pain controlled.  Denies new weakness.  Tolerating diet.  Reports no concerns w/ urination/defecation.   Family Communication: none at this time     Objective Findings:  Vitals:   08/27/22 1145 08/27/22 1200  08/27/22 1215 08/27/22 1230  BP: (!) 129/51 (!) 141/57 (!) 147/65 (!) 146/61  Pulse: (!) 57 (!) 57 (!) 57 (!) 58  Resp: 15 12 11 17   Temp:      TempSrc:      SpO2: 98% 96% 96% 97%  Weight:      Height:        Intake/Output Summary (Last 24 hours) at 08/27/2022 1528 Last data filed at 08/27/2022 [id number] Gross per 24 hour  Intake --  Output 500 ml  Net -500 ml   Filed Weights   08/22/22 1427 08/24/22 0751 08/25/22 0700  Weight: 85.7 kg 85.2 kg 84.9 kg    Examination:  Physical Exam Constitutional:      General: He is not in acute distress.    Appearance: Normal appearance.  Cardiovascular:     Rate and Rhythm: Normal rate and regular rhythm.     Heart sounds: Normal heart sounds.  Pulmonary:     Effort: Pulmonary effort is normal. No respiratory distress.     Breath sounds: Normal breath sounds.  Abdominal:     Tenderness: There is no abdominal tenderness.  Musculoskeletal:     Cervical back: No rigidity.     Right lower leg: Edema (trace) present.     Left lower leg: Edema (trace) present.     Comments: Did not unwrap lower extremities (wrapped to ankles w/ ACE bandage) but no erythema /  edema proximal to wrappings   Neurological:     General: No focal deficit present.     Mental Status: He is alert and oriented to person, place, and time.  Psychiatric:        Mood and Affect: Mood normal.        Behavior: Behavior normal.        Thought Content: Thought content normal.          Scheduled Medications:   amiodarone  100 mg Oral Daily   amoxicillin-clavulanate  1 tablet Oral Q12H   apixaban  2.5 mg Oral BID   atorvastatin  80 mg Oral QHS   brinzolamide  1 drop Both Eyes TID   And   brimonidine  1 drop Both Eyes TID   collagenase   Topical Daily   cyanocobalamin  1,000 mcg Oral Daily   fentaNYL       furosemide  40 mg Intravenous Daily   heparin sodium (porcine)       insulin aspart  0-5 Units Subcutaneous QHS   insulin aspart  0-9 Units Subcutaneous TID WC    isosorbide mononitrate  90 mg Oral Daily   metoprolol succinate  50 mg Oral Daily   midazolam       pantoprazole  40 mg Oral Daily   sodium chloride flush  3 mL Intravenous Q12H   timolol  1 drop Both Eyes QHS   Travoprost (BAK Free)  1 drop Both Eyes QHS    Continuous Infusions:  sodium chloride Stopped (08/24/22 1617)   sodium chloride Stopped (08/27/22 1152)   sodium chloride 75 mL/hr at 08/27/22 1153   sodium chloride     ceFAZolin      PRN Medications:  sodium chloride, sodium chloride, acetaminophen, albuterol, ceFAZolin, dextromethorphan-guaiFENesin, fentaNYL, heparin sodium (porcine), HYDROcodone-acetaminophen, midazolam, morphine injection, nitroGLYCERIN, ondansetron (ZOFRAN) IV, oxyCODONE, polyethylene glycol, sodium chloride flush  Antimicrobials from admission:  Anti-infectives (From admission, onward)    Start     Dose/Rate Route Frequency Ordered Stop   08/27/22 1215  ceFAZolin (ANCEF) IVPB 1 g/50 mL premix  Status:  Discontinued        1 g 100 mL/hr over 30 Minutes Intravenous  Once 08/27/22 1204 08/27/22 1245   08/27/22 1024  ceFAZolin (ANCEF) IVPB 1 g/50 mL premix  Status:  Discontinued        over 30 Minutes  Continuous PRN 08/27/22 1024 08/27/22 1144   08/27/22 1015  ceFAZolin (ANCEF) 1-4 GM/50ML-% IVPB       Note to Pharmacy: Kathrin Ruddy L: cabinet override      08/27/22 1015 08/27/22 2229   08/25/22 1530  amoxicillin-clavulanate (AUGMENTIN) 875-125 MG per tablet 1 tablet        1 tablet Oral Every 12 hours 08/25/22 1440 08/29/22 0959   08/23/22 1600  cefTRIAXone (ROCEPHIN) 1 g in sodium chloride 0.9 % 100 mL IVPB  Status:  Discontinued        1 g 200 mL/hr over 30 Minutes Intravenous Every 24 hours 08/22/22 1752 08/25/22 1440   08/22/22 1545  cefTRIAXone (ROCEPHIN) 1 g in sodium chloride 0.9 % 100 mL IVPB        1 g 200 mL/hr over 30 Minutes Intravenous  Once 08/22/22 1531 08/22/22 1746           Data Reviewed:  I have personally reviewed the  following...  CBC: Recent Labs  Lab 08/22/22 1145 08/23/22 1138 08/24/22 0356  WBC 11.1* 10.3 10.0  NEUTROABS  --  7.4 6.8  HGB 10.6* 10.0* 10.0*  HCT 36.5* 33.7* 33.7*  MCV 98.6 98.3 94.7  PLT 260 208 469   Basic Metabolic Panel: Recent Labs  Lab 08/22/22 1145 08/23/22 0354 08/24/22 0356 08/25/22 1115 08/26/22 0458 08/27/22 0859  NA 138 138 138 134* 134* 136  K 5.7* 5.3* 4.6 4.4 4.2 5.0  CL 106 107 106 101 101 103  CO2 24 22 24 25 24 25   GLUCOSE 241* 160* 126* 208* 191* 185*  BUN 73* 67* 58* 61* 60* 50*  CREATININE 2.84* 2.27* 1.95* 1.99* 1.78* 1.58*  CALCIUM 8.5* 8.6* 8.5* 8.2* 8.6* 8.8*  MG 3.1* 2.8* 2.5*  --   --   --   PHOS  --   --  4.0  --   --   --    GFR: Estimated Creatinine Clearance: 40.2 mL/min (A) (by C-G formula based on SCr of 1.58 mg/dL (H)). Liver Function Tests: Recent Labs  Lab 08/24/22 0356  AST 16  ALT 33  ALKPHOS 180*  BILITOT 0.7  PROT 6.1*  ALBUMIN 3.0*   No results for input(s): "LIPASE", "AMYLASE" in the last 168 hours. No results for input(s): "AMMONIA" in the last 168 hours. Coagulation Profile: No results for input(s): "INR", "PROTIME" in the last 168 hours. Cardiac Enzymes: No results for input(s): "CKTOTAL", "CKMB", "CKMBINDEX", "TROPONINI" in the last 168 hours. BNP (last 3 results) No results for input(s): "PROBNP" in the last 8760 hours. HbA1C: No results for input(s): "HGBA1C" in the last 72 hours.  CBG: Recent Labs  Lab 08/26/22 1142 08/26/22 1713 08/26/22 2136 08/27/22 0836 08/27/22 0943  GLUCAP 181* 167* 188* 143* 147*   Lipid Profile: No results for input(s): "CHOL", "HDL", "LDLCALC", "TRIG", "CHOLHDL", "LDLDIRECT" in the last 72 hours.  Thyroid Function Tests: No results for input(s): "TSH", "T4TOTAL", "FREET4", "T3FREE", "THYROIDAB" in the last 72 hours. Anemia Panel: No results for input(s): "VITAMINB12", "FOLATE", "FERRITIN", "TIBC", "IRON", "RETICCTPCT" in the last 72 hours. Most Recent Urinalysis  On File:  No results found for: "COLORURINE", "APPEARANCEUR", "LABSPEC", "PHURINE", "GLUCOSEU", "HGBUR", "BILIRUBINUR", "KETONESUR", "PROTEINUR", "UROBILINOGEN", "NITRITE", "LEUKOCYTESUR" Sepsis Labs: @LABRCNTIP (procalcitonin:4,lacticidven:4) Microbiology: Recent Results (from the past 240 hour(s))  Blood culture (routine x 2)     Status: None   Collection Time: 08/22/22  4:20 PM   Specimen: BLOOD  Result Value Ref Range Status   Specimen Description BLOOD BLOOD RIGHT WRIST  Final   Special Requests   Final    BOTTLES DRAWN AEROBIC AND ANAEROBIC Blood Culture adequate volume   Culture   Final    NO GROWTH 5 DAYS Performed at [facility], [address] [place], [place] [address]    Report Status 08/27/2022 FINAL  Final  Blood culture (routine x 2)     Status: None   Collection Time: 08/22/22  4:20 PM   Specimen: BLOOD  Result Value Ref Range Status   Specimen Description BLOOD BLOOD RIGHT FOREARM  Final   Special Requests   Final    BOTTLES DRAWN AEROBIC AND ANAEROBIC Blood Culture adequate volume   Culture   Final    NO GROWTH 5 DAYS Performed at [facility], [address] [place], [place] [address]    Report Status 08/27/2022 FINAL  Final      Radiology Studies last 3 days: PERIPHERAL VASCULAR CATHETERIZATION  Result Date: 08/27/2022 See surgical note for result.            LOS: 5 days  Emeterio Reeve, DO [facility] 08/27/2022, 3:28 PM    Dictation software may have been used to generate the above note. Typos may occur and escape review in typed/dictated notes. Please contact Dr Sheppard Coil directly for clarity if needed.  Staff may message me via secure chat in [place]  but this may not receive an immediate response,  please page me for urgent matters!  If 7PM-7AM, please contact night coverage [email or web address]

## 2022-08-27 NOTE — Op Note (Signed)
OPERATIVE NOTE  [facility]  Percutaneous Study/Intervention Procedural Note    PROCEDURE: Right lower extremity angiography third order catheter placement Ultrasound-guided access left common femoral artery for sheath placement. 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  POST-OPERATIVE DIAGNOSIS: Same  SURGEON: Katha Cabal, M.D.  ANESTHESIA: Conscious sedation was administered under my direct supervision by the interventional radiology RN. IV Versed plus fentanyl were utilized. Continuous ECG, pulse oximetry and blood pressure was monitored throughout the entire procedure.  Conscious sedation was for a total of 70 minutes.  ESTIMATED BLOOD LOSS: Minimal cc  FINDING(S): 1.  Diffuse atherosclerotic changes of the right lower extremity, multilevel  SPECIMEN(S):  None  INDICATIONS:   Bradley [name] is a 82 y.o. y.o. male who presents with signs of sepsis secondary to infection of the right heel ulcer. He has nonpalpable pulses in association with the heel ulcer. The heel ulcer has not been healing in spite of appropriate wound care.  DESCRIPTION: After obtaining full informed written consent, the patient was brought back to the operating room and placed supine upon the operating table.  The patient received IV antibiotics prior to induction.  After obtaining adequate sedation, the patient was prepped and draped in the standard fashion and appropriate time out is called.    Ultrasound is placed in a sterile sleeve ultrasound as utilized secondary to lack of appropriate landmarks and to avoid vascular injury. Under real-time visualization the left common femoral artery is identified is echolucent and pulsatile indicating patency. Image is recorded for the permanent record. 1% lidocaine is then  infiltrated into the soft tissues with ultrasound visualization. Microneedle is then inserted into the anterior wall of the common femoral artery under direct visualization with ultrasound. Microwire followed by micro-sheath.   J-wire followed by 6 French sheath is then inserted without difficulty.  J-wire is then advanced with pigtail catheter up to the level of T12 and AP projection of the aorta is obtained. Pigtail catheter was repositioned to above the bifurcation and oblique view of the pelvis is obtained.  Using a Stiff angled Glide Wire wire and the pigtail catheter the aortic bifurcation was crossed and the catheter is advanced down to the external iliac artery. Oblique view of the femoral bifurcation is then obtained by hand injection. The wire is then reintroduced and the catheter is positioned within the SFA. AP projections of the right lower extremity are obtained for distal runoff.  After review the images distal images are not adequate and the wire is reintroduced a 6 [place] Ansell sheath is advanced up and over the bifurcation and positioned with its tip in the right common iliac artery.   6000 units of heparin is then given and allowed to circulate for several minutes.  With the sheath tip in the mid right common iliac artery magnified imaging in the LAO projection is then obtained which demonstrates a greater than 90% stenosis at the origin of the right external iliac artery.  Measurements are then made demonstrating a diameter in the right common iliac of approximately 9 mm and a diameter in the external iliac of approximately 6.5 mm.  Therefore a 9 mm x 40 mm Lifestar stent is deployed across this lesion and postdilated with a 7 mm x 40 mm Lutonix drug-eluting balloon inflated to 10 atm for 30 to 40 seconds.  Follow-up imaging demonstrates less than 10% residual  stenosis.  Attention is then turned to the SFA occlusion and the sheath is now advanced through the site of the intervention  and positioned in the mid right common femoral.  Multiple wires and catheters were utilized however I was not able to cross into the true lumen at the level of Hunter's canal.  Hand injections through the sheath were then utilized to create distal runoff.  After review of the images the catheter is removed sheath is pulled back into the left iliac system J-wire is then advanced and socially and LAO projection of the groin is obtained after review of this image a Star Cose device is deployed without difficulty.  INTERPRETATION: The abdominal aorta is opacified with a bolus injection of contrast. There is diffuse atherosclerotic changes clearly visible even under fluoroscopy without contrast enhancement. However, there are no hemodynamically significant lesions noted within the aorta, bilateral common iliac arteries and left external iliac artery.  The right external iliac artery demonstrates a greater than 90% stenosis at its origin.  The right common femoral and profunda femoris arteries are patent although heavily diseased.  At the femoral bifurcation there does appear to be significant common femoral plaque. The right superficial femoral artery demonstrates occlusion at its origin there is reconstitution in the distal Hunter's canal.  There is patency of the popliteal artery and the above-knee segment and the at knee segment.  There is an anatomic variant which demonstrates bifurcation of the peroneal artery and the posterior tibial artery in the area that would normally compromise the mid popliteal.  Anterior tibial origin is noted off the peroneal but is heavily diseased and occludes after approximately 2 cm.  The peroneal is 3 mm in diameter and the dominant runoff to the foot reconstituting the dorsalis pedis distally.  The anterior tibial is reconstituted in its midportion by profunda collaterals and is patent down to the foot.  The posterior tibial is patent in its proximal two thirds.  There is a  short segment of greater than 90% stenosis and then distal to this occlusion.  There is nonfilling of the plantar arteries.  Following angioplasty and stent placement as described above the right external iliac there is now less than 10% residual stenosis.  SUMMARY: Patient is status post successful reconstruction of the inflow with an excellent result for the external iliac artery on the right.  The long segment flush SFA occlusion is more problematic.  There is possibility to attempt crossing in a retrograde fashion perhaps via the posterior tibial artery.  He would also be a candidate for a right femoral to above-knee popliteal artery bypass with his peroneal runoff.  However he does have rather extensive comorbidities and a prolonged surgical intervention may be of higher risk.  This will be discussed with the patient.  COMPLICATIONS: There were no immediate complications.  CONDITION: Margaretmary Dys, M.D. [facility] Office: [phone]   08/27/2022,11:48 AM

## 2022-08-27 NOTE — Progress Notes (Signed)
Rounding Note    Patient Name: Bradley [name] Date of Encounter: 08/27/2022  [place] Cardiologist: Ida Rogue, MD   Subjective   Swelling continues to improve.  Felt short of breath walking to and from the bathroom last night.  No chest pain.  Underwent successful stenting of right external iliac earlier today by vascular surgery.  Inpatient Medications    Scheduled Meds:  amiodarone  100 mg Oral Daily   amoxicillin-clavulanate  1 tablet Oral Q12H   apixaban  2.5 mg Oral BID   atorvastatin  80 mg Oral QHS   brinzolamide  1 drop Both Eyes TID   And   brimonidine  1 drop Both Eyes TID   collagenase   Topical Daily   cyanocobalamin  1,000 mcg Oral Daily   fentaNYL       furosemide  40 mg Intravenous Daily   heparin sodium (porcine)       insulin aspart  0-5 Units Subcutaneous QHS   insulin aspart  0-9 Units Subcutaneous TID WC   isosorbide mononitrate  90 mg Oral Daily   metoprolol succinate  50 mg Oral Daily   midazolam       pantoprazole  40 mg Oral Daily   sodium chloride flush  3 mL Intravenous Q12H   timolol  1 drop Both Eyes QHS   Travoprost (BAK Free)  1 drop Both Eyes QHS   Continuous Infusions:  sodium chloride Stopped (08/24/22 1617)   sodium chloride Stopped (08/27/22 1152)   sodium chloride 75 mL/hr at 08/27/22 1153   sodium chloride     ceFAZolin     PRN Meds: sodium chloride, sodium chloride, acetaminophen, albuterol, ceFAZolin, dextromethorphan-guaiFENesin, fentaNYL, heparin sodium (porcine), HYDROcodone-acetaminophen, midazolam, morphine injection, nitroGLYCERIN, ondansetron (ZOFRAN) IV, oxyCODONE, polyethylene glycol, sodium chloride flush   Vital Signs    Vitals:   08/27/22 1145 08/27/22 1200 08/27/22 1215 08/27/22 1230  BP: (!) 129/51 (!) 141/57 (!) 147/65 (!) 146/61  Pulse: (!) 57 (!) 57 (!) 57 (!) 58  Resp: 15 12 11 17   Temp:      TempSrc:      SpO2: 98% 96% 96% 97%  Weight:      Height:        Intake/Output Summary  (Last 24 hours) at 08/27/2022 1514 Last data filed at 08/27/2022 0711 Gross per 24 hour  Intake --  Output 500 ml  Net -500 ml      08/25/2022    7:00 AM 08/24/2022    7:51 AM 08/22/2022    2:27 PM  Last 3 Weights  Weight (lbs) 187 lb 1.6 oz 187 lb 12.8 oz 188 lb 15 oz  Weight (kg) 84.868 kg 85.186 kg 85.7 kg      Telemetry    Sinus rhythm - Personally Reviewed  ECG    No new tracing - Personally Reviewed  Physical Exam   GEN: No acute distress.   Neck: No JVD Cardiac: Bradycardic but regular with 2/6 systolic murmur.  Left femoral arteriotomy site covered with clean dressing.  No hematoma appreciated. Respiratory: Clear anteriorly. GI: Soft, nontender, non-distended  MS: Trace pretibial edema bilaterally; No deformity. Neuro:  Nonfocal  Psych: Normal affect   Labs    High Sensitivity Troponin:   Recent Labs  Lab 08/22/22 1747 08/23/22 0354  TROPONINIHS 38* 35*     Chemistry Recent Labs  Lab 08/22/22 1145 08/23/22 0354 08/24/22 0356 08/25/22 1115 08/26/22 0458 08/27/22 0859  NA 138 138 138 134* 134*  136  K 5.7* 5.3* 4.6 4.4 4.2 5.0  CL 106 107 106 101 101 103  CO2 24 22 24 25 24 25   GLUCOSE 241* 160* 126* 208* 191* 185*  BUN 73* 67* 58* 61* 60* 50*  CREATININE 2.84* 2.27* 1.95* 1.99* 1.78* 1.58*  CALCIUM 8.5* 8.6* 8.5* 8.2* 8.6* 8.8*  MG 3.1* 2.8* 2.5*  --   --   --   PROT  --   --  6.1*  --   --   --   ALBUMIN  --   --  3.0*  --   --   --   AST  --   --  16  --   --   --   ALT  --   --  33  --   --   --   ALKPHOS  --   --  180*  --   --   --   BILITOT  --   --  0.7  --   --   --   GFRNONAA 22* 28* 34* 33* 38* 44*  ANIONGAP 8 9 8 8 9 8     Lipids  Recent Labs  Lab 08/22/22 1605  CHOL 75  TRIG 125  HDL 33*  LDLCALC 17  CHOLHDL 2.3    Hematology Recent Labs  Lab 08/22/22 1145 08/23/22 1138 08/24/22 0356  WBC 11.1* 10.3 10.0  RBC 3.70* 3.43* 3.56*  HGB 10.6* 10.0* 10.0*  HCT 36.5* 33.7* 33.7*  MCV 98.6 98.3 94.7  MCH 28.6 29.2 28.1   MCHC 29.0* 29.7* 29.7*  RDW 20.9* 21.0* 20.6*  PLT 260 208 247   Thyroid No results for input(s): "TSH", "FREET4" in the last 168 hours.  BNP Recent Labs  Lab 08/22/22 1145  BNP >4,500.0*    DDimer No results for input(s): "DDIMER" in the last 168 hours.   Radiology    PERIPHERAL VASCULAR CATHETERIZATION  Result Date: 08/27/2022 See surgical note for result.   Cardiac Studies   Cardiac cath 08/04/22 1. Known native 3VD (RCA not injected, known CTO)  2. Patent LIMA-LAD  3. Patent free RIMA-OM3  4. SVG-RPL with distal 99% lesion on hairpin turn adjacent to distal anastomosis (unchanged compared with Jan 2023 cath)   Recommendations:   1. D/C left radial TR band per protocol  2. Unchanged coronary anatomy compared with Jan 2023 cath.  The SVG-RPL lesion was attempted previously unsuccessfully.  Given new symptoms, unlikely that this is a culprit for current CP.  3. Continue work-up/management for LFLG aortic stenosis.    Echo 08/05/22 INTERPRETATION ---------------------------------------------------------------    INDETERMINATE.   VALVULAR REGURGITATION: MODERATE AR   VALVULAR STENOSIS: SEVERE AS   Note: LOW-DOSE DOBUTAMINE TO ASSESS AORTIC VALVE GRADIENTS   REST: Peak Vel=2.54m/sec; 41mmHg peak, 17mmHg mean; V2 = 55cm; V1 = 17cm;   LVOT Vel=0.84m/sec;  DI = 0.31; AVA=1.0cm2; SVI = 26.26ml/m2    66mcg: Peak Vel=2.17m/sec; 65mmHg peak, 79mmHg mean; V2 = 59cm; V1 =   12cm;LVOT Vel= 0.28m/sec;  DI = 0.20; AVA=0.6cm2; SVI = 18.81ml/m2    74mcg: Peak Vel=2.81m/sec; 82mmHg peak, 39mmHg mean; V2 = 59cm; V1 =   18cm;LVOT Vel= 0.22m/sec;  DI = 0.30; AVA=1.0cm2; SVI = 27.49ml/m2    39mcg: Peak Vel=2.60m/sec; 35.60mmHg peak, 70mmHg mean; V2 = 55cm; V1 =   11cm;LVOT Vel= 0.4m/sec;  DI = 0.20; AVA=0.6cm2; SVI = 17.20ml/m2    69mcg: Peak Vel=3.54m/sec; 52mmHg peak, 67mmHg mean; V2 = 54cm; V1 =   12cm;LVOT Vel=  0.4m/sec;  DI = 0.22; AVA=0.7cm2; SVI = 18.4ml/m2    MILD TO MODERATE  AORTIC REGURGITATION AT REST    NO CHANGE IN LV FUNCTION/STROKE VOLUME WITH DOBUTAMINE   MINIMAL INCREASE IN MEAN/PEAK GRADIENT WITH DOBUTAMINE; MEAN GRADIENT   INCREASED FROM 19 TO 89mmHg   Maximum workload of  1.00 METs was achieved during exercise.   NORMAL RESTING BP - BLUNTED RESPONSE   Echocardiogram completed on 03/12/2022 1. Left ventricular ejection fraction, by estimation, is 40 to 45%. The  left ventricle has mildly decreased function. The left ventricle  demonstrates regional wall motion abnormalities (see scoring  diagram/findings for description). There is moderate  left ventricular hypertrophy. Left ventricular diastolic parameters are  consistent with Grade II diastolic dysfunction (pseudonormalization).  Elevated left atrial pressure. There is severe hypokinesis of the left  ventricular, basal-mid inferoseptal wall  and inferior wall.   2. Right ventricular systolic function is mildly reduced. The right  ventricular size is normal.   3. Left atrial size was mildly dilated.   4. Right atrial size was mildly dilated.   5. The mitral valve is degenerative. Mild mitral valve regurgitation.   6. The aortic valve is tricuspid. There is moderate calcification of the  aortic valve. There is moderate thickening of the aortic valve. Aortic  valve regurgitation is mild. 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  While the mean gradient is only mildly   elevated, the abnormal valve area and dimensionless index calculations  suggest at least moderate to severe low-flow:low-gradient aortic stenosis.  Visual assessment of leaflet movement suggests that aortic stenosis is not  critical.   7. The inferior vena cava is normal in size with <50% respiratory  variability, suggesting right atrial pressure of 8 mmHg.   Patient Profile     82 y.o. male with a hx of HFrEF, CAD s/p CABGx 3V in 2016, Afib, CKD stage 3, DM2, HLD, and HTN who is being seen 08/23/2022 for the evaluation of  acute heart failure.  Assessment & Plan    Acute on chronic HFrEF: Volume status improving though likely still not euvolemic.  We will need to balance diuresis with contrast exposure for today's iliac intervention.  Patient net negative approximately 5 L this admission with acute intake recorded.  No weights documented in the last 2 days.  Patient currently receiving normal saline at 75 mL/hr. -Discontinue normal saline. -Continue furosemide 40 mg IV twice daily with close monitoring of renal function. -Continue current dose of metoprolol succinate; valsartan-sacubitril, empagliflozin, and spironolactone on hold in the setting of acute kidney injury superimposed on chronic kidney disease.  PAD with critical limb ischemia: Patient's status post right external iliac stenting today.  CTO of right SFA also noted but not intervened upon today. -Continue antibiotics per IM. -Clarify with vascular surgery regarding need for antiplatelet therapy; patient currently on apixaban alone. -Ongoing management of outflow/runoff disease per vascular surgery.  Aortic stenosis: Felt to be moderate by workup at [facility] in 2023.  Intervention not indicated at that time. -Continue diuresis, as above.  Coronary artery disease: No angina reported.  Recent cath showed stable anatomy.  Mild troponin elevation is flat and most consistent with supply-demand mismatch. -Currently only on apixaban in the setting of atrial fibrillation. -Continue antianginal therapy with metoprolol succinate and isosorbide mononitrate.  Persistent atrial fibrillation: Maintaining sinus rhythm. -Continue apixaban 2.5 mg twice daily in the setting of age and creatinine (not interrupted for vascular surgery procedure today). -Continue current doses of amiodarone and  metoprolol.  Acute kidney injury superimposed on chronic kidney disease: Creatinine continuing to improve. -Continue gentle diuresis. -Close monitoring of renal function in  the setting of contrast exposure today. -Consider restarting valsartan-sacubitril, empagliflozin, and spironolactone in stepwise fashion as renal function continues to improve.  For questions or updates, please contact [place] Please consult [email or web address] for contact info under [facility] Cardiology.  Signed, Nelva Bush, MD  08/27/2022, 3:14 PM

## 2022-08-28 ENCOUNTER — Inpatient Hospital Stay (HOSPITAL_COMMUNITY)
Admit: 2022-08-28 | Discharge: 2022-08-28 | Disposition: A | Discharge status: Home / Self Care | Payer: No Typology Code available for payment source | Attending: Internal Medicine | Admitting: Internal Medicine

## 2022-08-28 ENCOUNTER — Other Ambulatory Visit: Payer: Self-pay

## 2022-08-28 ENCOUNTER — Inpatient Hospital Stay: Payer: No Typology Code available for payment source

## 2022-08-28 DIAGNOSIS — I25110 Atherosclerotic heart disease of native coronary artery with unstable angina pectoris: Secondary | ICD-10-CM | POA: Diagnosis not present

## 2022-08-28 DIAGNOSIS — I5021 Acute systolic (congestive) heart failure: Secondary | ICD-10-CM | POA: Diagnosis not present

## 2022-08-28 DIAGNOSIS — I5023 Acute on chronic systolic (congestive) heart failure: Secondary | ICD-10-CM | POA: Diagnosis not present

## 2022-08-28 DIAGNOSIS — N179 Acute kidney failure, unspecified: Secondary | ICD-10-CM | POA: Diagnosis not present

## 2022-08-28 DIAGNOSIS — I739 Peripheral vascular disease, unspecified: Secondary | ICD-10-CM | POA: Diagnosis not present

## 2022-08-28 DIAGNOSIS — I5043 Acute on chronic combined systolic (congestive) and diastolic (congestive) heart failure: Secondary | ICD-10-CM | POA: Diagnosis not present

## 2022-08-28 LAB — TROPONIN I (HIGH SENSITIVITY)
Troponin I (High Sensitivity): 26 ng/L — ABNORMAL HIGH (ref <18)
Troponin I (High Sensitivity): 72 ng/L — ABNORMAL HIGH (ref <18)
Troponin I (High Sensitivity): 2572 ng/L (ref <18)
Troponin I (High Sensitivity): 2172 ng/L (ref <18)

## 2022-08-28 LAB — ECHOCARDIOGRAM COMPLETE
AR max vel: 0.73 cm2
AV Area VTI: 0.89 cm2
AV Area mean vel: 0.82 cm2
AV Mean grad: 14.3 mmHg
AV Peak grad: 25.9 mmHg
Ao pk vel: 2.54 m/s
Area-P 1/2: 5.23 cm2
Calc EF: 53.1 %
Height: 72 in
P 1/2 time: 436 msec
S' Lateral: 4.8 cm
Single Plane A2C EF: 53.9 %
Single Plane A4C EF: 53.7 %
Weight: 2993.6 oz

## 2022-08-28 LAB — CBC
HCT: 37.9 % — ABNORMAL LOW (ref 39.0–52.0)
Hemoglobin: 11.3 g/dL — ABNORMAL LOW (ref 13.0–17.0)
MCH: 29.1 pg (ref 26.0–34.0)
MCHC: 29.8 g/dL — ABNORMAL LOW (ref 30.0–36.0)
MCV: 97.7 fL (ref 80.0–100.0)
Platelets: 271 10*3/uL (ref 150–400)
RBC: 3.88 MIL/uL — ABNORMAL LOW (ref 4.22–5.81)
RDW: 21.5 % — ABNORMAL HIGH (ref 11.5–15.5)
WBC: 14.9 10*3/uL — ABNORMAL HIGH (ref 4.0–10.5)
nRBC: 0 % (ref 0.0–0.2)

## 2022-08-28 LAB — BASIC METABOLIC PANEL
Anion gap: 11 (ref 5–15)
Anion gap: 9 (ref 5–15)
BUN: 43 mg/dL — ABNORMAL HIGH (ref 8–23)
BUN: 51 mg/dL — ABNORMAL HIGH (ref 8–23)
CO2: 23 mmol/L (ref 22–32)
CO2: 26 mmol/L (ref 22–32)
Calcium: 9 mg/dL (ref 8.9–10.3)
Calcium: 8.6 mg/dL — ABNORMAL LOW (ref 8.9–10.3)
Chloride: 99 mmol/L (ref 98–111)
Chloride: 100 mmol/L (ref 98–111)
Creatinine, Ser: 1.61 mg/dL — ABNORMAL HIGH (ref 0.61–1.24)
Creatinine, Ser: 1.8 mg/dL — ABNORMAL HIGH (ref 0.61–1.24)
GFR, Estimated: 43 mL/min — ABNORMAL LOW (ref >60)
GFR, Estimated: 37 mL/min — ABNORMAL LOW (ref >60)
Glucose, Bld: 221 mg/dL — ABNORMAL HIGH (ref 70–99)
Glucose, Bld: 221 mg/dL — ABNORMAL HIGH (ref 70–99)
Potassium: 5.1 mmol/L (ref 3.5–5.1)
Potassium: 4.2 mmol/L (ref 3.5–5.1)
Sodium: 133 mmol/L — ABNORMAL LOW (ref 135–145)
Sodium: 135 mmol/L (ref 135–145)

## 2022-08-28 LAB — GLUCOSE, CAPILLARY
Glucose-Capillary: 201 mg/dL — ABNORMAL HIGH (ref 70–99)
Glucose-Capillary: 205 mg/dL — ABNORMAL HIGH (ref 70–99)
Glucose-Capillary: 201 mg/dL — ABNORMAL HIGH (ref 70–99)
Glucose-Capillary: 187 mg/dL — ABNORMAL HIGH (ref 70–99)

## 2022-08-28 LAB — APTT
aPTT: 69 seconds — ABNORMAL HIGH (ref 24–36)
aPTT: 79 seconds — ABNORMAL HIGH (ref 24–36)

## 2022-08-28 LAB — MAGNESIUM: Magnesium: 2.2 mg/dL (ref 1.7–2.4)

## 2022-08-28 MED ORDER — FUROSEMIDE 10 MG/ML IJ SOLN
80.0000 mg | Freq: Two times a day (BID) | INTRAMUSCULAR | Status: DC
  Administered 2022-08-28 (×2): 80 mg via INTRAVENOUS
  Filled 2022-08-28 (×2): qty 8

## 2022-08-28 MED ORDER — MORPHINE SULFATE (PF) 4 MG/ML IV SOLN
INTRAVENOUS | Status: AC
  Filled 2022-08-28: qty 1

## 2022-08-28 MED ORDER — PERFLUTREN LIPID MICROSPHERE
1.0000 mL | INTRAVENOUS | Status: AC | PRN
  Administered 2022-08-28: 2 mL via INTRAVENOUS

## 2022-08-28 MED ORDER — CHLORHEXIDINE GLUCONATE CLOTH 2 % EX PADS
6.0000 | MEDICATED_PAD | Freq: Every day | CUTANEOUS | Status: DC
  Administered 2022-08-28 – 2022-09-15 (×15): 6 via TOPICAL

## 2022-08-28 MED ORDER — MORPHINE SULFATE (PF) 4 MG/ML IV SOLN
4.0000 mg | Freq: Once | INTRAVENOUS | Status: AC
  Administered 2022-08-28: 4 mg via INTRAVENOUS

## 2022-08-28 MED ORDER — NITROGLYCERIN IN D5W 200-5 MCG/ML-% IV SOLN
0.0000 ug/min | INTRAVENOUS | Status: DC
  Administered 2022-08-28: 5 ug/min via INTRAVENOUS
  Filled 2022-08-28: qty 250

## 2022-08-28 MED ORDER — ASPIRIN 81 MG PO CHEW
324.0000 mg | CHEWABLE_TABLET | Freq: Once | ORAL | Status: AC
  Administered 2022-08-28: 324 mg via ORAL
  Filled 2022-08-28: qty 4

## 2022-08-28 MED ORDER — ASPIRIN 325 MG PO TBEC
DELAYED_RELEASE_TABLET | ORAL | Status: AC
  Filled 2022-08-28: qty 1

## 2022-08-28 MED ORDER — HEPARIN (PORCINE) 25000 UT/250ML-% IV SOLN
1100.0000 [IU]/h | INTRAVENOUS | Status: DC
  Administered 2022-08-28 – 2022-08-31 (×4): 1100 [IU]/h via INTRAVENOUS
  Filled 2022-08-28 (×4): qty 250

## 2022-08-28 MED ORDER — ALUM & MAG HYDROXIDE-SIMETH 200-200-20 MG/5ML PO SUSP
15.0000 mL | Freq: Once | ORAL | Status: AC
  Administered 2022-08-28: 15 mL via ORAL
  Filled 2022-08-28: qty 30

## 2022-08-28 NOTE — Progress Notes (Signed)
ANTICOAGULATION CONSULT NOTE - Initial Consult  Pharmacy Consult for Heparin  Indication: chest pain/ACS  Allergies  Allergen Reactions   Simvastatin Other (See Comments)    Per [place] records Per [facility] records  Per [facility] records Per [facility] records    Patient Measurements: Height: 6' (182.9 cm) Weight: 87.2 kg (192 lb 3.9 oz) IBW/kg (Calculated) : 77.6 Heparin Dosing Weight: 84.9 kg   Vital Signs: Temp: 97.9 F (36.6 C) (01/03 1055) Temp Source: Oral (01/03 1055) BP: 121/58 (01/03 1300) Pulse Rate: 63 (01/03 1300)  Labs: Recent Labs    08/26/22 0458 08/27/22 0859 08/28/22 0357 08/28/22 0447 08/28/22 0758 08/28/22 1426  HGB  --   --   --  11.3*  --   --   HCT  --   --   --  37.9*  --   --   PLT  --   --   --  271  --   --   APTT  --   --   --   --   --  69*  CREATININE 1.78* 1.58*  --  1.61*  --   --   TROPONINIHS  --   --  26*  --  72* 2,572*     Estimated Creatinine Clearance: 39.5 mL/min (A) (by C-G formula based on SCr of 1.61 mg/dL (H)).   Medical History: Past Medical History:  Diagnosis Date   (HFpEF) heart failure with preserved ejection fraction ([place])    Coronary artery disease    Diabetes mellitus without complication ([place])    Hyperlipidemia    Hypertension     Medications:  Medications Prior to Admission  Medication Sig Dispense Refill Last Dose   acetaminophen (TYLENOL) 325 MG tablet Take 650-1,300 mg by mouth 2 (two) times daily as needed.   Past Week   amiodarone (PACERONE) 200 MG tablet Take 100 mg by mouth daily.   Past Week   apixaban (ELIQUIS) 2.5 MG TABS tablet Take 2.5 mg by mouth 2 (two) times daily.   Past Week   atorvastatin (LIPITOR) 80 MG tablet TAKE ONE TABLET BY MOUTH AT BEDTIME FOR CHOLESTEROL   Past Week   Brinzolamide-Brimonidine 1-0.2 % SUSP Place 1 drop into the left eye 3 (three) times daily.   Past Week   carboxymethylcellulose 1 % ophthalmic solution Apply 1 drop to eye at bedtime.   Past Week   empagliflozin (JARDIANCE)  25 MG TABS tablet Take 12.5 mg by mouth every morning.   Past Week   furosemide (LASIX) 40 MG tablet Take 1 tablet (40 mg total) by mouth daily. 30 tablet 2 Past Week   metoprolol succinate (TOPROL-XL) 50 MG 24 hr tablet Take by mouth.   Past Week   nitroGLYCERIN (NITROSTAT) 0.4 MG SL tablet Place 1 tablet (0.4 mg total) under the tongue every 5 (five) minutes x 3 doses as needed for chest pain. 30 tablet 2 prn   pantoprazole (PROTONIX) 40 MG tablet Take 40 mg by mouth daily.   Past Week   polyethylene glycol (MIRALAX / GLYCOLAX) 17 g packet Take 17 g by mouth daily. 14 each 0 prn   Propylene Glycol 0.6 % SOLN Place 1 drop into both eyes 4 (four) times daily.   Past Week   sacubitril-valsartan (ENTRESTO) 24-26 MG Take 1 tablet by mouth every 12 (twelve) hours.   Past Week   spironolactone (ALDACTONE) 25 MG tablet Take 1 tablet by mouth daily.   Past Week   timolol (TIMOPTIC-XR) 0.5 %  ophthalmic gel-forming Place 1 drop into both eyes daily.   Past Week   travoprost, benzalkonium, (TRAVATAN) 0.004 % ophthalmic solution Place 1 drop into both eyes nightly.   Past Week   vitamin B-12 (CYANOCOBALAMIN) 500 MCG tablet Take 1,000 mcg by mouth daily. Before breakfast   Past Week   apixaban (ELIQUIS) 5 MG TABS tablet Take 1 tablet (5 mg total) by mouth 2 (two) times daily. (Patient not taking: Reported on 08/27/2022) 60 tablet 0 Not Taking   clopidogrel (PLAVIX) 75 MG tablet Take 1 tablet (75 mg total) by mouth daily with breakfast. (Patient not taking: Reported on 08/22/2022) 30 tablet 2 Not Taking   glipiZIDE (GLUCOTROL) 5 MG tablet Take by mouth. (Patient not taking: Reported on 08/27/2022)   Not Taking   HYDROcodone-acetaminophen (NORCO/VICODIN) 5-325 MG tablet Take 1 tablet by mouth every 6 (six) hours as needed for moderate pain. (Patient not taking: Reported on 08/27/2022) 20 tablet 0 Not Taking   isosorbide mononitrate (IMDUR) 60 MG 24 hr tablet Take 1 tablet (60 mg total) by mouth daily. (Patient not  taking: Reported on 08/27/2022) 30 tablet 0 Not Taking    Assessment: Pharmacy consulted to dose heparin in this 82 year old male for ACS/NSTEMI.   Pt was on Eliquis 2.5 mg PO BID , last dose on 1/02 @ 2114. CrCl = 39.5 ml/min   Goal of Therapy:  Heparin level 0.3-0.7 units/ml aPTT 66 - 102  seconds Monitor platelets by anticoagulation protocol: Yes   Plan:  Continue heparin infusion at 1100 units/hr aPTT in 8 hours Continue to monitor H&H and platelets Will use aPTT to guide dosing until correlates with HL.   Glean Salvo, PharmD, BCPS Clinical Pharmacist  08/28/2022 3:50 PM

## 2022-08-28 NOTE — Progress Notes (Signed)
Progress Note    08/28/2022 11:10 AM 1 Day Post-Op  Subjective:  Bradley [name] is a 82 y.o. 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   In the emergency department the patient was found to have BNP greater than 4500.  He has worsening bilateral leg edema. Both legs are oozing fluid. He has multiple skin tear in legs. According to the patient he was sent from the [place] today to our hospital with concerns for leg cellulitis.   Yesterday Bradley [name] underwent a percutaneous transluminal angioplasty and stent placement of his right iliac artery.  Puncture site is left groin which is clean dry and intact today.  No signs or symptoms of infection, hematoma, or seroma.  Patient's right lower extremity is warm to touch today.  He has a Doppler pedal pulse.  Patient states his leg feels much better today.  No complaints of pain.   Vitals:   08/28/22 0900 08/28/22 1055  BP:  (!) 107/52  Pulse:  67  Resp:  17  Temp:  97.9 F (36.6 C)  SpO2: 95% 94%   Physical Exam: Cardiac: Regular rate and rhythm.  Patient experienced chest pain last night was started on a nitroglycerin drip.  As well as a heparin drip. Lungs: Clear throughout except rales bilateral bases. Incisions: Left groin clean dry and intact, no hematoma or seroma. Extremities: Right lower extremity warm to touch with Doppler dorsalis pedis pulse. Abdomen: Soft, nontender, positive bowel sounds all quadrants Neurologic: AAOx3 to person place and time  CBC    Component Value Date/Time   WBC 14.9 (H) 08/28/2022 0447   RBC 3.88 (L) 08/28/2022 0447   HGB 11.3 (L) 08/28/2022 0447   HCT 37.9 (L) 08/28/2022 0447   PLT 271 08/28/2022 0447   MCV 97.7 08/28/2022 0447   MCH 29.1 08/28/2022 0447   MCHC 29.8 (L) 08/28/2022 0447   RDW 21.5 (H) 08/28/2022 0447   LYMPHSABS 1.8  08/24/2022 0356   MONOABS 1.0 08/24/2022 0356   EOSABS 0.2 08/24/2022 0356   BASOSABS 0.1 08/24/2022 0356    BMET    Component Value Date/Time   NA 133 (L) 08/28/2022 0447   K 5.1 08/28/2022 0447   CL 99 08/28/2022 0447   CO2 23 08/28/2022 0447   GLUCOSE 221 (H) 08/28/2022 0447   BUN 43 (H) 08/28/2022 0447   CREATININE 1.61 (H) 08/28/2022 0447   CALCIUM 9.0 08/28/2022 0447   GFRNONAA 43 (L) 08/28/2022 0447   GFRAA >60 03/14/2018 0551    INR    Component Value Date/Time   INR 1.2 03/11/2022 2322     Intake/Output Summary (Last 24 hours) at 08/28/2022 1110 Last data filed at 08/28/2022 1023 Gross per 24 hour  Intake 1332.5 ml  Output 1500 ml  Net -167.5 ml     Assessment/Plan:  82 y.o. male is s/p percutaneous transluminal angioplasty and stent placement of his right iliac artery. 1 Day Post-Op  and is recovering as expected.  Patient is currently on a nitroglycerin drip for some chest pain he developed overnight.  He also continues on a heparin drip.  Per Dr. Ella Jubilee MD, Patient is status post successful reconstruction of the inflow with an excellent result for the external iliac artery on the right. The long segment flush SFA occlusion is more problematic. There is possibility to attempt crossing in a retrograde  fashion perhaps via the posterior tibial artery. He would also be a candidate for a right femoral to above-knee popliteal artery bypass with his peroneal runoff. However he does have rather extensive comorbidities and a prolonged surgical intervention may be of higher risk. This will be discussed with the patient.    Drema [facility] 08/28/2022 11:10 AM

## 2022-08-28 NOTE — Progress Notes (Signed)
[facility]  Patient reevaluated this afternoon after transferring to stepdown.  He reports complete resolution of chest pain and significant improvement in dyspnea.  Nitroglycerin infusion is currently off.  He has diuresed well with escalation of furosemide.  Troponin bump noted, up to 2572 on last check.  Echocardiogram today showed stable mild reduction in LVEF.  Wall motion maladies suggest that culprit lesion may be occlusion of the distal left main supplying proximal septal branch.  I do not think that the LIMA-LAD or SVG-OM grafts have been affected based on wall motion abnormalities and patency on recent outside catheterization.  I recommend continued medical therapy, including IV nitroglycerin as needed for chest pain and IV heparin.  Continue aggressive diuresis, as supply-demand mismatch in the setting of acute HFrEF may have contributed to increased chest pain and dyspnea this morning.  Will continue to trend troponin and also repeat a BMP this evening.  Nelva Bush, MD [facility] 08/28/22 6:39 PM

## 2022-08-28 NOTE — Progress Notes (Signed)
Around 0330 pt complained of chest pain. 8/10, L side of chest, sharp, doesn't radiate. Other vital signs stable. EKG done. Pt placed on 2L [place]. Pt given nitro x3 with short relief. Also gave morphine per order. On call provider notified. New orders given.

## 2022-08-28 NOTE — Progress Notes (Signed)
Rounding Note    Patient Name: Bradley [name] Date of Encounter: 08/28/2022  [facility] Cardiologist: Julien Nordmann, MD   Subjective   Patient evaluated urgently this morning due to acute chest pain and dyspnea.  He awoke this morning with shortness of breath and sharp left-sided chest pain.  He had minimal improvement with sublingual nitroglycerin but is feeling a little better with morphine.  He still feels short of breath, better if he sits up.  He has not had any bleeding or discomfort in the groin following yesterday's right external iliac intervention.  Inpatient Medications    Scheduled Meds:  amiodarone  100 mg Oral Daily   amoxicillin-clavulanate  1 tablet Oral Q12H   aspirin EC  81 mg Oral Daily   atorvastatin  80 mg Oral QHS   brinzolamide  1 drop Both Eyes TID   And   brimonidine  1 drop Both Eyes TID   collagenase   Topical Daily   cyanocobalamin  1,000 mcg Oral Daily   furosemide  80 mg Intravenous BID   insulin aspart  0-5 Units Subcutaneous QHS   insulin aspart  0-9 Units Subcutaneous TID WC   isosorbide mononitrate  90 mg Oral Daily   metoprolol succinate  50 mg Oral Daily   pantoprazole  40 mg Oral Daily   sodium chloride flush  3 mL Intravenous Q12H   timolol  1 drop Both Eyes QHS   Travoprost (BAK Free)  1 drop Both Eyes QHS   Continuous Infusions:  sodium chloride Stopped (08/24/22 1617)   sodium chloride     heparin     nitroGLYCERIN     PRN Meds: sodium chloride, sodium chloride, acetaminophen, albuterol, dextromethorphan-guaiFENesin, HYDROcodone-acetaminophen, morphine injection, nitroGLYCERIN, ondansetron (ZOFRAN) IV, oxyCODONE, polyethylene glycol, sodium chloride flush   Vital Signs    Vitals:   08/28/22 0418 08/28/22 0429 08/28/22 0447 08/28/22 0526  BP: (!) 151/77 (!) 145/74 (!) 154/80 (!) 150/79  Pulse:  89 90 93  Resp:    20  Temp: 97.9 F (36.6 C)     TempSrc: Oral     SpO2: 100% 100% 100% 100%  Weight:      Height:         Intake/Output Summary (Last 24 hours) at 08/28/2022 0606 Last data filed at 08/27/2022 2100 Gross per 24 hour  Intake 972.5 ml  Output 1700 ml  Net -727.5 ml      08/25/2022    7:00 AM 08/24/2022    7:51 AM 08/22/2022    2:27 PM  Last 3 Weights  Weight (lbs) 187 lb 1.6 oz 187 lb 12.8 oz 188 lb 15 oz  Weight (kg) 84.868 kg 85.186 kg 85.7 kg      Telemetry    Normal sinus rhythm - Personally Reviewed  ECG    Normal sinus rhythm with first-degree AV block, borderline LVH, inferolateral ST depressions and T wave inversions as well as ST elevation isolated to aVR and V1. - Personally Reviewed  Physical Exam   GEN: No acute distress.  Seated on the edge of the bed. Neck: JVP approximately 8-10 cm. Cardiac: RRR with 2/6 systolic murmur.  No rubs or gallops. Respiratory: Mildly diminished breath sounds at both lung bases. GI: Soft, nontender, non-distended  MS: No edema; No deformity. Neuro:  Nonfocal  Psych: Normal affect   Labs    High Sensitivity Troponin:   Recent Labs  Lab 08/22/22 1747 08/23/22 0354 08/28/22 0357  TROPONINIHS 38* 35* 26*  Chemistry Recent Labs  Lab 08/23/22 0354 08/24/22 0356 08/25/22 1115 08/26/22 0458 08/27/22 0859 08/28/22 0447  NA 138 138   < > 134* 136 133*  K 5.3* 4.6   < > 4.2 5.0 5.1  CL 107 106   < > 101 103 99  CO2 22 24   < > 24 25 23   GLUCOSE 160* 126*   < > 191* 185* 221*  BUN 67* 58*   < > 60* 50* 43*  CREATININE 2.27* 1.95*   < > 1.78* 1.58* 1.61*  CALCIUM 8.6* 8.5*   < > 8.6* 8.8* 9.0  MG 2.8* 2.5*  --   --   --  2.2  PROT  --  6.1*  --   --   --   --   ALBUMIN  --  3.0*  --   --   --   --   AST  --  16  --   --   --   --   ALT  --  33  --   --   --   --   ALKPHOS  --  180*  --   --   --   --   BILITOT  --  0.7  --   --   --   --   GFRNONAA 28* 34*   < > 38* 44* 43*  ANIONGAP 9 8   < > 9 8 11    < > = values in this interval not displayed.    Lipids  Recent Labs  Lab 08/22/22 1605  CHOL 75  TRIG 125   HDL 33*  LDLCALC 17  CHOLHDL 2.3    Hematology Recent Labs  Lab 08/23/22 1138 08/24/22 0356 08/28/22 0447  WBC 10.3 10.0 14.9*  RBC 3.43* 3.56* 3.88*  HGB 10.0* 10.0* 11.3*  HCT 33.7* 33.7* 37.9*  MCV 98.3 94.7 97.7  MCH 29.2 28.1 29.1  MCHC 29.7* 29.7* 29.8*  RDW 21.0* 20.6* 21.5*  PLT 208 247 271   Thyroid No results for input(s): "TSH", "FREET4" in the last 168 hours.  BNP Recent Labs  Lab 08/22/22 1145  BNP >4,500.0*    DDimer No results for input(s): "DDIMER" in the last 168 hours.   Radiology    DG Chest Port 1 View  Result Date: 08/28/2022 CLINICAL DATA:  Chest pain tonight EXAM: PORTABLE CHEST 1 VIEW COMPARISON:  08/22/2022 FINDINGS: Cardiomegaly and vascular pedicle widening. Prior CABG. Stable aortic contours. Diffuse hazy appearance of the chest with interstitial opacity from [facility] lines and small right pleural effusion. No pneumothorax. Artifact from EKG leads. IMPRESSION: CHF. Electronically Signed   By: Jorje Guild M.D.   On: 08/28/2022 04:14   PERIPHERAL VASCULAR CATHETERIZATION  Result Date: 08/27/2022 See surgical note for result.   Cardiac Studies   Cardiac cath 08/04/22 1. Known native 3VD (RCA not injected, known CTO)  2. Patent LIMA-LAD  3. Patent free RIMA-OM3  4. SVG-RPL with distal 99% lesion on hairpin turn adjacent to distal anastomosis (unchanged compared with Jan 2023 cath)   Recommendations:   1. D/C left radial TR band per protocol  2. Unchanged coronary anatomy compared with Jan 2023 cath.  The SVG-RPL lesion was attempted previously unsuccessfully.  Given new symptoms, unlikely that this is a culprit for current CP.  3. Continue work-up/management for LFLG aortic stenosis.    Echo 08/05/22 INTERPRETATION ---------------------------------------------------------------    INDETERMINATE.   VALVULAR REGURGITATION: MODERATE AR   VALVULAR STENOSIS: SEVERE AS  Note: LOW-DOSE DOBUTAMINE TO ASSESS AORTIC VALVE GRADIENTS   REST:  Peak Vel=2.22m/sec; 74mmHg peak, 34mmHg mean; V2 = 55cm; V1 = 17cm;   LVOT Vel=0.35m/sec;  DI = 0.31; AVA=1.0cm2; SVI = 26.19ml/m2    46mcg: Peak Vel=2.110m/sec; 52mmHg peak, 85mmHg mean; V2 = 59cm; V1 =   12cm;LVOT Vel= 0.64m/sec;  DI = 0.20; AVA=0.6cm2; SVI = 18.66ml/m2    106mcg: Peak Vel=2.35m/sec; 74mmHg peak, 31mmHg mean; V2 = 59cm; V1 =   18cm;LVOT Vel= 0.60m/sec;  DI = 0.30; AVA=1.0cm2; SVI = 27.76ml/m2    58mcg: Peak Vel=2.6m/sec; 35.70mmHg peak, 74mmHg mean; V2 = 55cm; V1 =   11cm;LVOT Vel= 0.34m/sec;  DI = 0.20; AVA=0.6cm2; SVI = 17.55ml/m2    52mcg: Peak Vel=3.68m/sec; 55mmHg peak, 35mmHg mean; V2 = 54cm; V1 =   12cm;LVOT Vel= 0.77m/sec;  DI = 0.22; AVA=0.7cm2; SVI = 18.91ml/m2    MILD TO MODERATE AORTIC REGURGITATION AT REST    NO CHANGE IN LV FUNCTION/STROKE VOLUME WITH DOBUTAMINE   MINIMAL INCREASE IN MEAN/PEAK GRADIENT WITH DOBUTAMINE; MEAN GRADIENT   INCREASED FROM 19 TO 29mmHg   Maximum workload of  1.00 METs was achieved during exercise.   NORMAL RESTING BP - BLUNTED RESPONSE   Echocardiogram completed on 03/12/2022 1. Left ventricular ejection fraction, by estimation, is 40 to 45%. The  left ventricle has mildly decreased function. The left ventricle  demonstrates regional wall motion abnormalities (see scoring  diagram/findings for description). There is moderate  left ventricular hypertrophy. Left ventricular diastolic parameters are  consistent with Grade II diastolic dysfunction (pseudonormalization).  Elevated left atrial pressure. There is severe hypokinesis of the left  ventricular, basal-mid inferoseptal wall  and inferior wall.   2. Right ventricular systolic function is mildly reduced. The right  ventricular size is normal.   3. Left atrial size was mildly dilated.   4. Right atrial size was mildly dilated.   5. The mitral valve is degenerative. Mild mitral valve regurgitation.   6. The aortic valve is tricuspid. There is moderate calcification of the   aortic valve. There is moderate thickening of the aortic valve. Aortic  valve regurgitation is mild. 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  While the mean gradient is only mildly   elevated, the abnormal valve area and dimensionless index calculations  suggest at least moderate to severe low-flow:low-gradient aortic stenosis.  Visual assessment of leaflet movement suggests that aortic stenosis is not  critical.   7. The inferior vena cava is normal in size with <50% respiratory  variability, suggesting right atrial pressure of 8 mmHg.   Patient Profile     82 y.o. 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  Assessment & Plan    Coronary artery disease with unstable angina: Mr. Matsuura awoke this morning with worsening shortness of breath, orthopnea, and sharp left-sided chest pain that was only slightly responsive to nitroglycerin.  Discomfort has improved with IV morphine though he still complains of 4/10 chest pain.  His EKG shows more pronounced ST/T changes suggestive of global ischemia but localizing primarily to the inferolateral leads.  His hemoglobin is slightly better than yesterday.  I suspect that his acute symptoms reflect supply-demand mismatch in the setting of worsening acute on chronic HFrEF.  His chest radiograph this morning suggests  increasing pulmonary edema.  He underwent coronary angiography at [facility] less than a month ago, showing stable findings of severe native CAD, patent LIMA-LAD and SVG-OM, and patent SVG-R PLA with severe disease at the distal anastomosis that could could not be intervened upon previously at [facility]. -Agree with initiation of nitroglycerin infusion.  I  will hold isosorbide mononitrate while patient is on IV nitroglycerin. -Discontinue apixaban and initiate heparin infusion in case cardiac catheterization is necessary.  However, I would favor deferring this unless Mr. Schleyer has refractory symptoms as multiple catheterizations as recently as a month ago have shown severe disease with patent bypass grafts and no targets for intervention. -Continue current dose of metoprolol for now; up titration can be considered for antianginal therapy though we will need to keep a close eye on his PR interval with mild first-degree AV block noted on most recent EKG. -Continue atorvastatin 80 mg daily and aspirin 81 mg daily. -Trend troponin until it has peaked.  Acute on chronic HFrEF: Suspect worsening heart failure may be contributing to chest pain and progressive dyspnea/orthopnea. -Increase furosemide to 80 mg IV twice daily, first dose now. -If renal function remains stable with escalation of diuresis, consider stepwise reintroduction of Entresto, Jardiance, and spironolactone. -Repeat echo to ensure there has been no significant interval change from recent echoes at [facility], given acute exacerbation.  Aortic stenosis: Moderate based on most recent workup at [facility]. -Continue outpatient follow-up.  PAD with critical limb ischemia: Patient's status post right external iliac stenting yesterday.  He still has CTO of right SFA. -Continue aspirin and heparin (in the lieu of apixaban).  Try to avoid triple therapy if possible to minimize risk for bleeding. -Continue statin therapy. -Continue wound care and ongoing follow-up by vascular surgery.  Persistent atrial fibrillation: Mr. Woehler is maintaining sinus rhythm. -Continue amiodarone and metoprolol. -Transition apixaban to heparin infusion pending workup/management of unstable angina.  Acute kidney injury superimposed on chronic kidney disease: Creatinine essentially unchanged since yesterday, improved  from admission. -Escalate diuresis, as above, in the setting of acute on chronic HFrEF. -Monitor renal function closely with contrast exposure yesterday. -Avoid nephrotoxic agents.  For questions or updates, please contact [facility] Please consult [email or web address] for contact info under [facility] Card[facility].     Signed, Yvonne Kendall, MD  08/28/2022, 6:06 AM

## 2022-08-28 NOTE — Progress Notes (Addendum)
CROSS COVER NOTE  NAME: Bradley [name] MRN: [id number] DOB : 09-Feb-1941 ATTENDING PHYSICIAN: Emeterio Reeve, DO    Date of Service   08/28/2022   HPI/Events of Note   This morning Bradley [name] is reporting 8/10 sharp (L) side chest pain. The pain is reproducible on exam. He endorses dyspnea, denies abdominal pain, nausea, or palpitations. Pain decreased to 3/10 after receiving 3 SL Nitro and IV morphine. Patient later had recurrence of 8/10 CP after ~30 mins.   Bradley [name] is a 82 y.o. 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 He is being treated for CHF exacerbation and underwent angiography and stenting of (R) external iliac with vascular surgery yesterday.  Interventions   Assessment/Plan:  EKG-> ST elevation in aVR and V1 with global ST depression in 1,2, 3, aVF, v4-v6 CXR--> Kerley lines seen indicating interstitial edema Hold IVF Troponin- 26--> Case discussed with night TR[facility] attending, did not feel EKG met STEMI criteria, he is in agreement with above plan.  Bradley [name]'s 8/10 chest pain returned  ASA 361m Dr End STEMI MD paged- [facility]. Recommends medical management. Agrees with Nitroglycerin gtt if chest pain not resolved with morphine. Move over to ICU for aggressive Nitro titration. Dr End en route to bedside to evaluate Bradley [name]  CBC (HGB 11.3 up from 10.0 on 08/24/22) , BMP Mg    Greatly appreciate Dr EDarnelle Bosassistance and management.      To reach the provider On-Call:   7AM- 7PM see care teams to locate the attending and reach out to them via [email or web address] 7PM-7AM contact night-coverage If you still have difficulty reaching the appropriate provider, please page the [facility](Director on Call) for [facility] on amion for assistance  This document  was prepared using DSet designersoftware and may include unintentional dictation errors.  KNeomia GlassDNP, MBA, FNP-BC Nurse Practitioner [facility] Hospital[facility]ager ([phone]

## 2022-08-28 NOTE — Progress Notes (Addendum)
Attempted to meet with patient and his niece Lenell Antu today.  Patient is currently asleep after experiencing a rough night per the niece.  Briefly discussed heart failure and how he is going to take care of himself at home.  She states that she along with the patient's sister tried to prepare meals for him that are healthy but he does not always eat what has been prepared for him.  She was given the living with heart failure teaching booklet, zone magnet, info on heart failure and low-sodium along with weight chart.  Also explained follow-up in the outpatient heart failure clinic.  Plan to come back at another time when patient is awake.  Pricilla Riffle RN CHFN

## 2022-08-28 NOTE — Progress Notes (Signed)
Attempted to met with patient and his niece to discuss heart failure. He was off the floor for procedure.  Make arrangements to speak with both of them tomorrow at [place]

## 2022-08-28 NOTE — Progress Notes (Signed)
ANTICOAGULATION CONSULT NOTE - Initial Consult  Pharmacy Consult for Heparin  Indication: chest pain/ACS  Allergies  Allergen Reactions   Simvastatin Other (See Comments)    Per [place] records Per [facility] records  Per [facility] records Per [facility]: [facility] records    Patient Measurements: Height: 6' (182.9 cm) Weight: 84.9 kg (187 lb 1.6 oz) IBW/kg (Calculated) : 77.6 Heparin Dosing Weight: 84.9 kg   Vital Signs: Temp: 97.9 F (36.6 C) (01/03 0418) Temp Source: Oral (01/03 0418) BP: 150/79 (01/03 0526) Pulse Rate: 93 (01/03 0526)  Labs: Recent Labs    08/26/22 0458 08/27/22 0859 08/28/22 0357 08/28/22 0447  HGB  --   --   --  11.3*  HCT  --   --   --  37.9*  PLT  --   --   --  271  CREATININE 1.78* 1.58*  --  1.61*  TROPONINIHS  --   --  26*  --     Estimated Creatinine Clearance: 39.5 mL/min (A) (by C-G formula based on SCr of 1.61 mg/dL (H)).   Medical History: Past Medical History:  Diagnosis Date   (HFpEF) heart failure with preserved ejection fraction ([place])    Coronary artery disease    Diabetes mellitus without complication ([place])    Hyperlipidemia    Hypertension     Medications:  Medications Prior to Admission  Medication Sig Dispense Refill Last Dose   acetaminophen (TYLENOL) 325 MG tablet Take 650-1,300 mg by mouth 2 (two) times daily as needed.   Past Week   amiodarone (PACERONE) 200 MG tablet Take 100 mg by mouth daily.   Past Week   apixaban (ELIQUIS) 2.5 MG TABS tablet Take 2.5 mg by mouth 2 (two) times daily.   Past Week   atorvastatin (LIPITOR) 80 MG tablet TAKE ONE TABLET BY MOUTH AT BEDTIME FOR CHOLESTEROL   Past Week   Brinzolamide-Brimonidine 1-0.2 % SUSP Place 1 drop into the left eye 3 (three) times daily.   Past Week   carboxymethylcellulose 1 % ophthalmic solution Apply 1 drop to eye at bedtime.   Past Week   empagliflozin (JARDIANCE) 25 MG TABS tablet Take 12.5 mg by mouth every morning.   Past Week   furosemide (LASIX) 40 MG tablet Take 1 tablet (40  mg total) by mouth daily. 30 tablet 2 Past Week   metoprolol succinate (TOPROL-XL) 50 MG 24 hr tablet Take by mouth.   Past Week   nitroGLYCERIN (NITROSTAT) 0.4 MG SL tablet Place 1 tablet (0.4 mg total) under the tongue every 5 (five) minutes x 3 doses as needed for chest pain. 30 tablet 2 prn   pantoprazole (PROTONIX) 40 MG tablet Take 40 mg by mouth daily.   Past Week   polyethylene glycol (MIRALAX / GLYCOLAX) 17 g packet Take 17 g by mouth daily. 14 each 0 prn   Propylene Glycol 0.6 % SOLN Place 1 drop into both eyes 4 (four) times daily.   Past Week   sacubitril-valsartan (ENTRESTO) 24-26 MG Take 1 tablet by mouth every 12 (twelve) hours.   Past Week   spironolactone (ALDACTONE) 25 MG tablet Take 1 tablet by mouth daily.   Past Week   timolol (TIMOPTIC-XR) 0.5 % ophthalmic gel-forming Place 1 drop into both eyes daily.   Past Week   travoprost, benzalkonium, (TRAVATAN) 0.004 % ophthalmic solution Place 1 drop into both eyes nightly.   Past Week   vitamin B-12 (CYANOCOBALAMIN) 500 MCG tablet Take 1,000 mcg by mouth daily. Before breakfast  Past Week   apixaban (ELIQUIS) 5 MG TABS tablet Take 1 tablet (5 mg total) by mouth 2 (two) times daily. (Patient not taking: Reported on 08/27/2022) 60 tablet 0 Not Taking   clopidogrel (PLAVIX) 75 MG tablet Take 1 tablet (75 mg total) by mouth daily with breakfast. (Patient not taking: Reported on 08/22/2022) 30 tablet 2 Not Taking   glipiZIDE (GLUCOTROL) 5 MG tablet Take by mouth. (Patient not taking: Reported on 08/27/2022)   Not Taking   HYDROcodone-acetaminophen (NORCO/VICODIN) 5-325 MG tablet Take 1 tablet by mouth every 6 (six) hours as needed for moderate pain. (Patient not taking: Reported on 08/27/2022) 20 tablet 0 Not Taking   isosorbide mononitrate (IMDUR) 60 MG 24 hr tablet Take 1 tablet (60 mg total) by mouth daily. (Patient not taking: Reported on 08/27/2022) 30 tablet 0 Not Taking    Assessment: Pharmacy consulted to dose heparin in this 82 year  old male for ACS/NSTEMI.   Pt was on Eliquis 2.5 mg PO BID , last dose on 1/02 @ 2114. CrCl = 39.5 ml/min   Goal of Therapy:  Heparin level 0.3-0.7 units/ml aPTT 66 - 102  seconds Monitor platelets by anticoagulation protocol: Yes   Plan:  No bolus b/c recent use of Eliquis Start heparin infusion at 1100 units/hr Continue to monitor H&H and platelets Will use aPTT to guide dosing until correlates with HL. Will check HL and aPTT 8 hrs after start of drip.   Bradley [name] D 08/28/2022,5:54 AM

## 2022-08-28 NOTE — Progress Notes (Signed)
PROGRESS NOTE    Bradley [name]  RUE:[id number] DOB: [birth year] DOA: 08/22/2022 PCP: System, Provider Not In  [id number]/IC05A-[id number]  LOS: 6 days   Brief hospital course: Bradley [name] is a 82 y.o. 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 Patient stated that he has chronic shortness of breath, which has been progressively worsening in the past several weeks, much worse day of arrival to ED, assoc w/ bilateral leg edema and legs oozing fluid. According to the patient he was sent from the [place] to our hospital with concerns for leg cellulitis. 12/28: pt was found to have BNP > 4500,  WBC 11.1, potassium 5.7, worsening renal function w/ Cr 2.84, VSS 121/54, oxygen saturation 100% on room air.  Chest x-ray showed low volume without infiltration.  Patient was admitted to telemetry bed as inpatient for tx CHF 12/29: Cardiology consulted  12/30: Debridement R foot wound w/ podiatry. Vascular surgery planning angiography pending improvementin renal function. 12/31: renal function about same Cr 1.99. Net IO Since Admission: -4,208.77 mL [08/25/22 1516] 01/01: renal fxn improving to Cr 1.78. Angiography planned for tomorrow  01/02: LE angiography w/ stent to R external iliac artery. Consideration for right femoral to above-knee popliteal artery bypass will be discussed w/ patient   Assessment & Plan: Acute on chronic combined systolic and diastolic CHF (congestive heart failure) (HCC) 2D echo on 03/12/2022 showed EF of 40-45% with grade 2 diastolic dysfunction.  BNP> 4500. 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  --cont Augmentin --cont dressing change per order   PVD (peripheral vascular disease) ([place]) S/p LE angiography w/ stent to R external iliac artery.  --cont Asa and statin Consideration for right femoral to above-knee popliteal artery bypass   Acute renal failure superimposed on stage 3a chronic kidney disease ([place]):  Baseline creatinine 1.38 on 03/14/2022.   On Admission, Cr 2.84 --Holding Entresto, spironolactone, Jardiance --monitor Cr while diuresing   NSTEMI CAD (coronary artery disease status post CABG and prior PCI --underwent coronary angiography at [place] less than a month ago, showing stable findings of severe native CAD, patent LIMA-LAD and SVG-OM, and patent SVG-R PLA with severe disease at the distal anastomosis that could could not be intervened upon previously at [facility].  --chest pain this morning with increasing trop --cont heparin gtt --cont ASA and statin --trend trop   Essential Hypertension --cont Toprol   Moderate aortic stenosis Recent echo done showed moderate aortic stenosis at [place] TAVR is not indicated at this time and cardiology is following   HLD (hyperlipidemia) Lipid Panel done and showed a total cholesterol/HDL ratio of 2.3, cholesterol level 175, HDL 33, LDL 17, triglycerides 125, VLDL 25 Statin Therapy with Atorvastatin    Type II diabetes mellitus with renal manifestations ([place]):  Recent A1c 7.8.   Poorly controlled.   Patient taking glipizide and Jardiance at home which are held Sliding scale insulin   Paroxysmal atrial fibrillation [facility]) s/p Cardioversion Amiodarone 100 mg po daily and Metoprolol Succinate 50 mg po Daily --cont heparin gtt    Hyperkalemia:  S/p Loklema --  monitor   Normocytic Anemia --No overt bleeding noted monitor CBC    DVT prophylaxis: QP:[id number] gtt Code Status: DNR  Family Communication: Bradley [name] updated at bedside  today Level of care: Stepdown Dispo:   The patient is from: home Anticipated d/c is to: undetermined Anticipated d/c date is: undetermined   Subjective and Interval History:  Pt had chest pain early this morning, started on nitro gtt and heparin gtt, given morphine.  Chest pain resolved.   Objective: Vitals:   08/28/22 1200 08/28/22 1300 08/28/22 1400 08/28/22 1608  BP: (!) 114/57 (!) 121/58 (!) 117/56 (!) 124/57  Pulse: 64 63 62 65  Resp: 12 14 14 16   Temp:      TempSrc:      SpO2: 95% 96% 96% 91%  Weight:      Height:        Intake/Output Summary (Last 24 hours) at 08/28/2022 1729 Last data filed at 08/28/2022 1600 Gross per 24 hour  Intake 709.69 ml  Output 1500 ml  Net -790.31 ml   Filed Weights   08/24/22 0751 08/25/22 0700 08/28/22 1055  Weight: 85.2 kg 84.9 kg 87.2 kg    Examination:   Constitutional: NAD, AAOx3 HEENT: conjunctivae and lids normal, EOMI CV: No cyanosis.   RESP: normal respiratory effort, on RA Extremities: BLE in ACE wraps SKIN: warm, dry Neuro: II - XII grossly intact.     Data Reviewed: I have personally reviewed labs and imaging studies  Time spent: 50 minutes  Enzo Bi, MD [facility] If 7PM-7AM, please contact night-coverage 08/28/2022, 5:29 PM

## 2022-08-29 DIAGNOSIS — I509 Heart failure, unspecified: Secondary | ICD-10-CM

## 2022-08-29 DIAGNOSIS — I480 Paroxysmal atrial fibrillation: Secondary | ICD-10-CM | POA: Diagnosis not present

## 2022-08-29 DIAGNOSIS — I214 Non-ST elevation (NSTEMI) myocardial infarction: Secondary | ICD-10-CM | POA: Diagnosis not present

## 2022-08-29 DIAGNOSIS — I739 Peripheral vascular disease, unspecified: Secondary | ICD-10-CM | POA: Diagnosis not present

## 2022-08-29 DIAGNOSIS — I5043 Acute on chronic combined systolic (congestive) and diastolic (congestive) heart failure: Secondary | ICD-10-CM | POA: Diagnosis not present

## 2022-08-29 LAB — CBC
HCT: 31.9 % — ABNORMAL LOW (ref 39.0–52.0)
Hemoglobin: 9.7 g/dL — ABNORMAL LOW (ref 13.0–17.0)
MCH: 29.2 pg (ref 26.0–34.0)
MCHC: 30.4 g/dL (ref 30.0–36.0)
MCV: 96.1 fL (ref 80.0–100.0)
Platelets: 183 10*3/uL (ref 150–400)
RBC: 3.32 MIL/uL — ABNORMAL LOW (ref 4.22–5.81)
RDW: 21.6 % — ABNORMAL HIGH (ref 11.5–15.5)
WBC: 9.5 10*3/uL (ref 4.0–10.5)
nRBC: 0 % (ref 0.0–0.2)

## 2022-08-29 LAB — MAGNESIUM: Magnesium: 2.1 mg/dL (ref 1.7–2.4)

## 2022-08-29 LAB — APTT: aPTT: 71 seconds — ABNORMAL HIGH (ref 24–36)

## 2022-08-29 LAB — TROPONIN I (HIGH SENSITIVITY): Troponin I (High Sensitivity): 1216 ng/L (ref <18)

## 2022-08-29 LAB — HEPARIN LEVEL (UNFRACTIONATED): Heparin Unfractionated: 1.1 IU/mL — ABNORMAL HIGH (ref 0.30–0.70)

## 2022-08-29 LAB — GLUCOSE, CAPILLARY
Glucose-Capillary: 159 mg/dL — ABNORMAL HIGH (ref 70–99)
Glucose-Capillary: 204 mg/dL — ABNORMAL HIGH (ref 70–99)
Glucose-Capillary: 219 mg/dL — ABNORMAL HIGH (ref 70–99)
Glucose-Capillary: 212 mg/dL — ABNORMAL HIGH (ref 70–99)

## 2022-08-29 MED ORDER — FUROSEMIDE 10 MG/ML IJ SOLN
80.0000 mg | Freq: Two times a day (BID) | INTRAMUSCULAR | Status: DC
  Administered 2022-08-29 – 2022-09-01 (×8): 80 mg via INTRAVENOUS
  Filled 2022-08-29 (×8): qty 8

## 2022-08-29 MED ORDER — POLYETHYLENE GLYCOL 3350 17 G PO PACK
17.0000 g | PACK | Freq: Two times a day (BID) | ORAL | Status: DC
  Administered 2022-08-29 – 2022-09-12 (×15): 17 g via ORAL
  Filled 2022-08-29 (×32): qty 1

## 2022-08-29 MED ORDER — INSULIN ASPART 100 UNIT/ML IJ SOLN
3.0000 [IU] | Freq: Three times a day (TID) | INTRAMUSCULAR | Status: DC
  Administered 2022-08-29 – 2022-09-16 (×50): 3 [IU] via SUBCUTANEOUS
  Filled 2022-08-29 (×47): qty 1

## 2022-08-29 NOTE — Progress Notes (Addendum)
Rounding Note    Patient Name: Bradley [name] Date of Encounter: 08/29/2022  [place] Cardiologist: Ida Rogue, MD   Subjective   Episode of chest pain yesterday treated medically Denies significant chest pain overnight or this morning Woke up with significant shortness of breath, concerned he still has fluid in his lungs Received IV Lasix 80 twice daily yesterday, -5.9 L total this admission  Inpatient Medications    Scheduled Meds:  amiodarone  100 mg Oral Daily   aspirin EC  81 mg Oral Daily   atorvastatin  80 mg Oral QHS   brinzolamide  1 drop Both Eyes TID   And   brimonidine  1 drop Both Eyes TID   Chlorhexidine Gluconate Cloth  6 each Topical Daily   collagenase   Topical Daily   cyanocobalamin  1,000 mcg Oral Daily   furosemide  80 mg Intravenous BID   insulin aspart  0-5 Units Subcutaneous QHS   insulin aspart  0-9 Units Subcutaneous TID WC   metoprolol succinate  50 mg Oral Daily   pantoprazole  40 mg Oral Daily   sodium chloride flush  3 mL Intravenous Q12H   timolol  1 drop Both Eyes QHS   Travoprost (BAK Free)  1 drop Both Eyes QHS   Continuous Infusions:  sodium chloride Stopped (08/24/22 1617)   sodium chloride     heparin 1,100 Units/hr (08/29/22 0128)   nitroGLYCERIN Stopped (08/28/22 1058)   PRN Meds: sodium chloride, sodium chloride, acetaminophen, albuterol, dextromethorphan-guaiFENesin, HYDROcodone-acetaminophen, morphine injection, nitroGLYCERIN, ondansetron (ZOFRAN) IV, oxyCODONE, polyethylene glycol, sodium chloride flush   Vital Signs    Vitals:   08/29/22 0700 08/29/22 0800 08/29/22 0900 08/29/22 1000  BP: 139/64 (!) 119/58 (!) 138/119 128/61  Pulse: 68 66 73 66  Resp: 15 13 15 10   Temp:  98.7 F (37.1 C)    TempSrc:  Oral    SpO2: 100% 100% 100% 100%  Weight:      Height:        Intake/Output Summary (Last 24 hours) at 08/29/2022 1039 Last data filed at 08/28/2022 1600 Gross per 24 hour  Intake 349.69 ml  Output  750 ml  Net -400.31 ml      08/28/2022   10:55 AM 08/25/2022    7:00 AM 08/24/2022    7:51 AM  Last 3 Weights  Weight (lbs) 192 lb 3.9 oz 187 lb 1.6 oz 187 lb 12.8 oz  Weight (kg) 87.2 kg 84.868 kg 85.186 kg      Telemetry     - Personally Reviewed  ECG     - Personally Reviewed  Physical Exam   GEN: No acute distress.   Neck:  JVD 8+ Cardiac: RRR, no murmurs, rubs, or gallops.  Respiratory: Clear to auscultation bilaterally.  Scattered Rales, wheezes  GI: Soft, nontender, non-distended  MS: No edema; No deformity. Neuro:  Nonfocal  Psych: Normal affect   Labs    High Sensitivity Troponin:   Recent Labs  Lab 08/28/22 0357 08/28/22 0758 08/28/22 1426 08/28/22 2138 08/29/22 0518  TROPONINIHS 26* 72* 2,572* 2,172* 1,216*     Chemistry Recent Labs  Lab 08/24/22 0356 08/25/22 1115 08/27/22 0859 08/28/22 0447 08/28/22 2307 08/29/22 0518  NA 138   < > 136 133* 135  --   K 4.6   < > 5.0 5.1 4.2  --   CL 106   < > 103 99 100  --   CO2 24   < >  25 23 26   --   GLUCOSE 126*   < > 185* 221* 221*  --   BUN 58*   < > 50* 43* 51*  --   CREATININE 1.95*   < > 1.58* 1.61* 1.80*  --   CALCIUM 8.5*   < > 8.8* 9.0 8.6*  --   MG 2.5*  --   --  2.2  --  2.1  PROT 6.1*  --   --   --   --   --   ALBUMIN 3.0*  --   --   --   --   --   AST 16  --   --   --   --   --   ALT 33  --   --   --   --   --   ALKPHOS 180*  --   --   --   --   --   BILITOT 0.7  --   --   --   --   --   GFRNONAA 34*   < > 44* 43* 37*  --   ANIONGAP 8   < > 8 11 9   --    < > = values in this interval not displayed.    Lipids  Recent Labs  Lab 08/22/22 1605  CHOL 75  TRIG 125  HDL 33*  LDLCALC 17  CHOLHDL 2.3    Hematology Recent Labs  Lab 08/24/22 0356 08/28/22 0447 08/29/22 0518  WBC 10.0 14.9* 9.5  RBC 3.56* 3.88* 3.32*  HGB 10.0* 11.3* 9.7*  HCT 33.7* 37.9* 31.9*  MCV 94.7 97.7 96.1  MCH 28.1 29.1 29.2  MCHC 29.7* 29.8* 30.4  RDW 20.6* 21.5* 21.6*  PLT 247 271 183    Thyroid No results for input(s): "TSH", "FREET4" in the last 168 hours.  BNP Recent Labs  Lab 08/22/22 1145  BNP >4,500.0*    DDimer No results for input(s): "DDIMER" in the last 168 hours.   Radiology    ECHOCARDIOGRAM COMPLETE  Result Date: 08/28/2022    ECHOCARDIOGRAM REPORT   Patient Name:   DAUNTAE POEPPING Date of Exam: 08/28/2022 Medical Rec #:  [id number]    Height:       72.0 in Accession #:    [id number]   Weight:       187.1 lb Date of Birth:  [birth year]    BSA:          2.071 m Patient Age:    82 years     BP:           141/79 mmHg Patient Gender: M            HR:           87 bpm. Exam Location:  [facility] Procedure: 2D Echo, Cardiac Doppler, Color Doppler and Intracardiac            Opacification Agent Indications:     CHF-acute systolic [id number]  History:         Patient has prior history of Echocardiogram examinations, most                  recent 03/12/2022. Risk Factors:Diabetes and Hypertension. Heart                  failure with preserved ejection fraction.  Sonographer:     Sherrie Sport Referring Phys:  [phone] CHRISTOPHER END Diagnosing Phys: Ida Rogue MD  Sonographer Comments: Suboptimal apical window. IMPRESSIONS  1. Left ventricular ejection fraction, by estimation, is 40 to 45%. The left ventricle has mildly decreased function. The left ventricle demonstrates global hypokinesis with severe hypokinesis of the bassal anterior and anteroseptal wall, and inferior wall. Left ventricular diastolic parameters are consistent with Grade II diastolic dysfunction (pseudonormalization). There is the interventricular septum is flattened in systole and diastole, consistent with right ventricular pressure and volume overload.  2. Right ventricular systolic function is normal. The right ventricular size is normal. There is moderately elevated pulmonary artery systolic pressure. The estimated right ventricular systolic pressure is 96.2 mmHg.  3. Left atrial size was mildly dilated.  4. Right atrial size  was mildly dilated.  5. The mitral valve is normal in structure. Mild to moderate mitral valve regurgitation. No evidence of mitral stenosis.  6. Tricuspid valve regurgitation is mild to moderate.  7. The aortic valve is tricuspid. There is moderate calcification of the aortic valve. Aortic valve regurgitation is mild to moderate. Mild aortic valve stenosis by Aortic valve mean gradient measures 14.3 mmHg. Aortic valve Vmax measures 2.54 m/s.Moderate stenosis by Aortic valve area, by VTI measures 0.89 cm.  8. The inferior vena cava is normal in size with greater than 50% respiratory variability, suggesting right atrial pressure of 3 mmHg. FINDINGS  Left Ventricle: Left ventricular ejection fraction, by estimation, is 40 to 45%. The left ventricle has mildly decreased function. The left ventricle demonstrates global hypokinesis. Definity contrast agent was given IV to delineate the left ventricular  endocardial borders. The left ventricular internal cavity size was normal in size. There is no left ventricular hypertrophy. The interventricular septum is flattened in systole and diastole, consistent with right ventricular pressure and volume overload. Left ventricular diastolic parameters are consistent with Grade II diastolic dysfunction (pseudonormalization). Right Ventricle: The right ventricular size is normal. No increase in right ventricular wall thickness. Right ventricular systolic function is normal. There is moderately elevated pulmonary artery systolic pressure. The tricuspid regurgitant velocity is 3.35 m/s, and with an assumed right atrial pressure of 5 mmHg, the estimated right ventricular systolic pressure is 95.2 mmHg. Left Atrium: Left atrial size was mildly dilated. Right Atrium: Right atrial size was mildly dilated. Pericardium: There is no evidence of pericardial effusion. Mitral Valve: The mitral valve is normal in structure. There is mild thickening of the mitral valve leaflet(s). Mild to  moderate mitral valve regurgitation. No evidence of mitral valve stenosis. Tricuspid Valve: The tricuspid valve is normal in structure. Tricuspid valve regurgitation is mild to moderate. No evidence of tricuspid stenosis. Aortic Valve: The aortic valve is tricuspid. There is moderate calcification of the aortic valve. Aortic valve regurgitation is mild to moderate. Aortic regurgitation PHT measures 436 msec. Mild aortic stenosis is present. Aortic valve mean gradient measures 14.3 mmHg. Aortic valve peak gradient measures 25.9 mmHg. Aortic valve area, by VTI measures 0.89 cm. Pulmonic Valve: The pulmonic valve was normal in structure. Pulmonic valve regurgitation is trivial. No evidence of pulmonic stenosis. Aorta: The aortic root is normal in size and structure. Venous: The inferior vena cava is normal in size with greater than 50% respiratory variability, suggesting right atrial pressure of 3 mmHg. IAS/Shunts: No atrial level shunt detected by color flow Doppler.  LEFT VENTRICLE PLAX 2D LVIDd:         5.20 cm      Diastology LVIDs:         4.80 cm      LV e' lateral:   9.03 cm/s LV PW:  1.10 cm      LV E/e' lateral: 15.1 LV IVS:        1.10 cm LVOT diam:     2.00 cm LV SV:         43 LV SV Index:   21 LVOT Area:     3.14 cm  LV Volumes (MOD) LV vol d, MOD A2C: 156.0 ml LV vol d, MOD A4C: 177.0 ml LV vol s, MOD A2C: 71.9 ml LV vol s, MOD A4C: 81.9 ml LV SV MOD A2C:     84.1 ml LV SV MOD A4C:     177.0 ml LV SV MOD BP:      88.4 ml RIGHT VENTRICLE RV Basal diam:  4.60 cm RV Mid diam:    3.50 cm RV S prime:     7.51 cm/s TAPSE (M-mode): 0.9 cm LEFT ATRIUM             Index        RIGHT ATRIUM           Index LA diam:        4.80 cm 2.32 cm/m   RA Area:     16.20 cm LA Vol (A2C):   57.0 ml 27.52 ml/m  RA Volume:   38.70 ml  18.69 ml/m LA Vol (A4C):   66.7 ml 32.21 ml/m LA Biplane Vol: 64.8 ml 31.29 ml/m  AORTIC VALVE                     PULMONIC VALVE AV Area (Vmax):    0.73 cm      PR End Diast Vel:  7.40 msec AV Area (Vmean):   0.82 cm AV Area (VTI):     0.89 cm AV Vmax:           254.33 cm/s AV Vmean:          172.667 cm/s AV VTI:            0.482 m AV Peak Grad:      25.9 mmHg AV Mean Grad:      14.3 mmHg LVOT Vmax:         59.10 cm/s LVOT Vmean:        45.000 cm/s LVOT VTI:          0.136 m LVOT/AV VTI ratio: 0.28 AI PHT:            436 msec  AORTA Ao Root diam: 2.70 cm MITRAL VALVE                TRICUSPID VALVE MV Area (PHT): 5.23 cm     TR Peak grad:   44.9 mmHg MV Decel Time: 145 msec     TR Vmax:        335.00 cm/s MV E velocity: 136.00 cm/s MV A velocity: 52.40 cm/s   SHUNTS MV E/A ratio:  2.60         Systemic VTI:  0.14 m                             Systemic Diam: 2.00 cm Ida Rogue MD Electronically signed by Ida Rogue MD Signature Date/Time: 08/28/2022/8:30:12 AM    Final    DG Chest Port 1 View  Result Date: 08/28/2022 CLINICAL DATA:  Chest pain tonight EXAM: PORTABLE CHEST 1 VIEW COMPARISON:  08/22/2022 FINDINGS: Cardiomegaly and vascular pedicle widening. Prior CABG. Stable aortic contours. Diffuse hazy appearance  of the chest with interstitial opacity from [facility] and small right pleural effusion. No pneumothorax. Artifact from EKG leads. IMPRESSION: CHF. Electronically Signed   By: Jorje Guild M.D.   On: 08/28/2022 04:14    Cardiac Studies   Echo   1. Left ventricular ejection fraction, by estimation, is 40 to 45%. The  left ventricle has mildly decreased function. The left ventricle  demonstrates global hypokinesis with severe hypokinesis of the bassal  anterior and anteroseptal wall, and inferior  wall. Left ventricular diastolic parameters are consistent with Grade II  diastolic dysfunction (pseudonormalization). There is the interventricular  septum is flattened in systole and diastole, consistent with right  ventricular pressure and volume  overload.   2. Right ventricular systolic function is normal. The right ventricular  size is normal. There is  moderately elevated pulmonary artery systolic  pressure. The estimated right ventricular systolic pressure is [id number] mmHg.   3. Left atrial size was mildly dilated.   4. Right atrial size was mildly dilated.   5. The mitral valve is normal in structure. Mild to moderate mitral valve  regurgitation. No evidence of mitral stenosis.   6. Tricuspid valve regurgitation is mild to moderate.   7. The aortic valve is tricuspid. There is moderate calcification of the  aortic valve. Aortic valve regurgitation is mild to moderate. Mild aortic  valve stenosis by Aortic valve mean gradient measures 14.3 mmHg. Aortic  valve Vmax measures 2.54  m/s.Moderate stenosis by Aortic valve area, by VTI measures 0.89 cm.   8. The inferior vena cava is normal in size with greater than 50%  respiratory variability, suggesting right atrial pressure of 3 mmHg.   Patient Profile     82 y.o. 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   Assessment & Plan    Coronary artery disease with angina Episode of angina yesterday, slight response to nitro, no improvement with IV morphine No additional anginal symptoms overnight or this morning Seen by interventional cardiology yesterday, cardiac catheterization less than 1 month ago showing stable severe three-vessel disease, grafts patent -Restarted on nitro infusion and heparin infusion yesterday for chest pain -On aspirin, Lipitor Troponin continues to trend downward 2500 on August 28, 2098, 1200 this morning despite events from yesterday -Plan to continue heparin infusion today, hold nitro infusion    Acute on chronic diastolic and systolic CHF Significant shortness of  breath this morning per the patient concerning for continued fluid overload Recommend we continue Lasix IV 80 twice daily In stool, Jardiance, spironolactone on hold for now Cardiogram stable from prior studies Incentive spirometer  Arctic valve stenosis Moderate, outpatient follow-up  PAD with critical limb ischemia Status post right external iliac stenting 2 days ago ETO of right SFA On aspirin, heparin, Eliquis on hold for now  Paroxysmal atrial fibrillation  on amiodarone and metoprolol, on heparin infusion Maintaining normal sinus rhythm  Acute on chronic kidney failure Will continue to monitor in the setting of aggressive diuretics Recently received contrast for PV procedure   Total encounter time more than 5*0 minutes  Greater than 50% was spent in counseling and coordination of care with the patient    For questions or updates, please contact [place] Please consult [email or web address] for contact info under  Signed, Ida Rogue, MD  08/29/2022, 10:39 AM

## 2022-08-29 NOTE — Inpatient Diabetes Management (Signed)
Inpatient Diabetes Program Recommendations  AACE/ADA: New Consensus Statement on Inpatient Glycemic Control   Target Ranges:  Prepandial:   less than 140 mg/dL      Peak postprandial:   less than 180 mg/dL (1-2 hours)      Critically ill patients:  140 - 180 mg/dL    Latest Reference Range & Units 08/28/22 08:00 08/28/22 11:01 08/28/22 16:23 08/28/22 20:33 08/29/22 07:26  Glucose-Capillary 70 - 99 mg/dL 201 (H) 205 (H) 201 (H) 187 (H) 159 (H)   Review of Glycemic Control  Diabetes history: DM2 Outpatient Diabetes medications: Jardiance 12.5 mg daily, Glipizide 5 mg daily Current orders for Inpatient glycemic control: Novolog 0-9 units TID with meals, Novolog 0-5 units QHS  Inpatient Diabetes Program Recommendations:    Insulin: Please consider ordering Novolog 3 units TID with meals for meal coverage if patient eats at least 50% of meals.  Thanks, Barnie Alderman, RN, MSN, [place] Diabetes Coordinator Inpatient Diabetes Program [phone] (Team Pager from 8am to [place])

## 2022-08-29 NOTE — Progress Notes (Addendum)
   Heart Failure Nurse Navigator Note  Met with the patient and his niece Lenell Antu who was in his room at the time when I rounded in the ICU.  She states that she has read through the heart failure materials and had been doing a lot of as mentioned in the teaching materials.  There had been some dietary indiscretions on Christmas morning patient had country  ham.  He also mentioned that he wanted his niece to bring him steak biscuit from [employer].  Researched on the internet the  sodium content in that biscuit which is 1270 mg of sodium.  Made aware that he should not be eating any more than 2000 mg a day.  His niece Lenell Antu states they try to make good healthy meals for the patient.  Discussed fresh or frozen, foods in their natural state.  Also went over fluid restriction of 64 ounces and everything that constitutes a liquid.  Patient does weigh himself daily at home.  Discussed reporting 2 pound weight gain overnight or 5 pounds of the week along with changes in symptoms such as increasing shortness of breath, lower extremity edema PND and orthopnea etc.  He voices understanding.  Patient states that he is very interested in following with the outpatient heart failure clinic but is not sure that [facility] would [facility]  They will look into that with the [place]

## 2022-08-29 NOTE — Progress Notes (Signed)
PROGRESS NOTE    Bradley [name]  ION:[id number] DOB: 11/24/1940 DOA: 08/22/2022 PCP: System, Provider Not In  254A/254A-AA  LOS: 7 days   Brief hospital course: Bradley [name] is a 82 y.o. 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 Patient stated that he has chronic shortness of breath, which has been progressively worsening in the past several weeks, much worse day of arrival to ED, assoc w/ bilateral leg edema and legs oozing fluid. According to the patient he was sent from the [place] to our hospital with concerns for leg cellulitis. 12/28: pt was found to have BNP > 4500,  WBC 11.1, potassium 5.7, worsening renal function w/ Cr 2.84, VSS 121/54, oxygen saturation 100% on room air.  Chest x-ray showed low volume without infiltration.  Patient was admitted to telemetry bed as inpatient for tx CHF 12/29: Cardiology consulted  12/30: Debridement R foot wound w/ podiatry. Vascular surgery planning angiography pending improvementin renal function. 12/31: renal function about same Cr 1.99. Net IO Since Admission: -4,208.77 mL [08/25/22 1516] 01/01: renal fxn improving to Cr 1.78. Angiography planned for tomorrow  01/02: LE angiography w/ stent to R external iliac artery. Consideration for right femoral to above-knee popliteal artery bypass will be discussed w/ patient   Assessment & Plan:  Acute on chronic combined systolic and diastolic CHF (congestive heart failure) (HCC) 2D echo on 03/12/2022 showed EF of 40-45% with grade 2 diastolic dysfunction.  BNP> 4500. 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  Plan: --cont Augmentin --cont dressing change per order   PVD (peripheral vascular disease) ([place]) S/p LE angiography w/ stent to R external iliac artery.  --cont Asa and statin Consideration for right femoral to above-knee popliteal artery bypass   Acute renal failure superimposed on stage 3a chronic kidney disease ([place]):  Baseline creatinine 1.38 on 03/14/2022.   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  --chest pain morning of 1/3 with increasing trop from 72 to 2572, trending down now. Plan: --cont heparin gtt, per cardio --cont ASA and statin   Essential Hypertension --cont Toprol   Moderate aortic stenosis Recent echo done showed moderate aortic stenosis at [facility] TAVR is not indicated at this time and cardiology is following   HLD (hyperlipidemia) Lipid Panel done and showed a total cholesterol/HDL ratio of 2.3, cholesterol level 175, HDL 33, LDL 17, triglycerides 125, VLDL 25 Statin Therapy with Atorvastatin    Type II diabetes mellitus with renal manifestations ([place]):  Recent A1c 7.8.   Poorly controlled.   Patient taking glipizide and Jardiance at home which are held --SSI --add mealtime 3u TID   Paroxysmal atrial fibrillation (HCC) s/p Cardioversion Amiodarone 100 mg po daily and Metoprolol Succinate  50 mg po Daily --cont heparin gtt    Hyperkalemia:  S/p Loklema --monitor   Normocytic Anemia --No overt bleeding noted monitor CBC    DVT prophylaxis:  YI:RSWNIOE gtt Code Status: DNR  Family Communication:  Level of care: Progressive Dispo:   The patient is from: home Anticipated d/c is to: home Anticipated d/c date is: 2-3 days   Subjective and Interval History:  Pt reported shortness of breath this morning, resolved later.   Objective: Vitals:   08/29/22 1300 08/29/22 1400 08/29/22 1500 08/29/22 1606  BP: 138/61 (!) 138/59  (!) 140/61  Pulse: 70 74 70 65  Resp: 15 13 13 16   Temp: 97.7 F (36.5 C)   98 F (36.7 C)  TempSrc: Oral   Oral  SpO2: 99% 98% 100% 99%  Weight:      Height:        Intake/Output Summary (Last 24 hours) at 08/29/2022 1741 Last data filed at 08/29/2022 1638 Gross per 24 hour  Intake 240 ml  Output 1026 ml  Net -786 ml   Filed Weights   08/24/22 0751 08/25/22 0700 08/28/22 1055  Weight: 85.2 kg 84.9 kg 87.2 kg    Examination:   Constitutional: NAD, AAOx3, sitting in recliner HEENT: conjunctivae and lids normal, EOMI CV: No cyanosis.   RESP: normal respiratory effort, on 2L sating 100% Extremities: ACE wrap over both lower legs Neuro: II - XII grossly intact.   Psych: Normal mood and affect.  Appropriate judgement and reason   Data Reviewed: I have personally reviewed labs and imaging studies  Time spent: 35 minutes  Enzo Bi, MD [facility] If 7PM-7AM, please contact night-coverage 08/29/2022, 5:41 PM

## 2022-08-29 NOTE — Progress Notes (Signed)
ANTICOAGULATION CONSULT NOTE - Initial Consult  Pharmacy Consult for Heparin  Indication: chest pain/ACS  Allergies  Allergen Reactions   Simvastatin Other (See Comments)    Per [place] records Per [facility] records  Per [facility] records Per [facility] records    Patient Measurements: Height: 6' (182.9 cm) Weight: 87.2 kg (192 lb 3.9 oz) IBW/kg (Calculated) : 77.6 Heparin Dosing Weight: 84.9 kg   Vital Signs: Temp: 98.1 F (36.7 C) (01/03 2000) Temp Source: Oral (01/04 0000) BP: 117/52 (01/04 0000) Pulse Rate: 62 (01/04 0000)  Labs: Recent Labs    08/27/22 0859 08/28/22 0357 08/28/22 0447 08/28/22 0758 08/28/22 1426 08/28/22 2138 08/28/22 2307 08/29/22 0518  HGB  --   --  11.3*  --   --   --   --  9.7*  HCT  --   --  37.9*  --   --   --   --  31.9*  PLT  --   --  271  --   --   --   --  183  APTT  --   --   --   --  69*  --  79* 71*  HEPARINUNFRC  --   --   --   --   --   --   --  1.10*  CREATININE 1.58*  --  1.61*  --   --   --  1.80*  --   TROPONINIHS  --    < >  --    < > 2,572* 2,172*  --  1,216*   < > = values in this interval not displayed.     Estimated Creatinine Clearance: 35.3 mL/min (A) (by C-G formula based on SCr of 1.8 mg/dL (H)).   Medical History: Past Medical History:  Diagnosis Date   (HFpEF) heart failure with preserved ejection fraction ([place])    Coronary artery disease    Diabetes mellitus without complication ([place])    Hyperlipidemia    Hypertension     Medications:  Medications Prior to Admission  Medication Sig Dispense Refill Last Dose   acetaminophen (TYLENOL) 325 MG tablet Take 650-1,300 mg by mouth 2 (two) times daily as needed.   Past Week   amiodarone (PACERONE) 200 MG tablet Take 100 mg by mouth daily.   Past Week   apixaban (ELIQUIS) 2.5 MG TABS tablet Take 2.5 mg by mouth 2 (two) times daily.   Past Week   atorvastatin (LIPITOR) 80 MG tablet TAKE ONE TABLET BY MOUTH AT BEDTIME FOR CHOLESTEROL   Past Week   Brinzolamide-Brimonidine  1-0.2 % SUSP Place 1 drop into the left eye 3 (three) times daily.   Past Week   carboxymethylcellulose 1 % ophthalmic solution Apply 1 drop to eye at bedtime.   Past Week   empagliflozin (JARDIANCE) 25 MG TABS tablet Take 12.5 mg by mouth every morning.   Past Week   furosemide (LASIX) 40 MG tablet Take 1 tablet (40 mg total) by mouth daily. 30 tablet 2 Past Week   metoprolol succinate (TOPROL-XL) 50 MG 24 hr tablet Take by mouth.   Past Week   nitroGLYCERIN (NITROSTAT) 0.4 MG SL tablet Place 1 tablet (0.4 mg total) under the tongue every 5 (five) minutes x 3 doses as needed for chest pain. 30 tablet 2 prn   pantoprazole (PROTONIX) 40 MG tablet Take 40 mg by mouth daily.   Past Week   polyethylene glycol (MIRALAX / GLYCOLAX) 17 g packet Take 17 g by mouth daily.  14 each 0 prn   Propylene Glycol 0.6 % SOLN Place 1 drop into both eyes 4 (four) times daily.   Past Week   sacubitril-valsartan (ENTRESTO) 24-26 MG Take 1 tablet by mouth every 12 (twelve) hours.   Past Week   spironolactone (ALDACTONE) 25 MG tablet Take 1 tablet by mouth daily.   Past Week   timolol (TIMOPTIC-XR) 0.5 % ophthalmic gel-forming Place 1 drop into both eyes daily.   Past Week   travoprost, benzalkonium, (TRAVATAN) 0.004 % ophthalmic solution Place 1 drop into both eyes nightly.   Past Week   vitamin B-12 (CYANOCOBALAMIN) 500 MCG tablet Take 1,000 mcg by mouth daily. Before breakfast   Past Week   apixaban (ELIQUIS) 5 MG TABS tablet Take 1 tablet (5 mg total) by mouth 2 (two) times daily. (Patient not taking: Reported on 08/27/2022) 60 tablet 0 Not Taking   clopidogrel (PLAVIX) 75 MG tablet Take 1 tablet (75 mg total) by mouth daily with breakfast. (Patient not taking: Reported on 08/22/2022) 30 tablet 2 Not Taking   glipiZIDE (GLUCOTROL) 5 MG tablet Take by mouth. (Patient not taking: Reported on 08/27/2022)   Not Taking   HYDROcodone-acetaminophen (NORCO/VICODIN) 5-325 MG tablet Take 1 tablet by mouth every 6 (six) hours as  needed for moderate pain. (Patient not taking: Reported on 08/27/2022) 20 tablet 0 Not Taking   isosorbide mononitrate (IMDUR) 60 MG 24 hr tablet Take 1 tablet (60 mg total) by mouth daily. (Patient not taking: Reported on 08/27/2022) 30 tablet 0 Not Taking    Assessment: Pharmacy consulted to dose heparin in this 82 year old male for ACS/NSTEMI.   Pt was on Eliquis 2.5 mg PO BID , last dose on 1/02 @ 2114. CrCl = 39.5 ml/min   Goal of Therapy:  Heparin level 0.3-0.7 units/ml aPTT 66 - 102  seconds Monitor platelets by anticoagulation protocol: Yes   Plan:  1/3:  aPTT @ 2307 = 79, therapeutic X 2 Will continue pt on current rate and recheck aPTT and HL on 1/4 with AM labs.  1/4 @ 0518:  aPTT = 71,  HL = 1.10 aPTT therapeutic X 3 but HL remains elevated. Will continue pt on current rate and recheck HL and aPTT on 1/5 with AM labs.   Conception Doebler D Clinical Pharmacist  08/29/2022 6:23 AM

## 2022-08-29 NOTE — Progress Notes (Signed)
ANTICOAGULATION CONSULT NOTE - Initial Consult  Pharmacy Consult for Heparin  Indication: chest pain/ACS  Allergies  Allergen Reactions   Simvastatin Other (See Comments)    Per [place] records Per [facility] records  Per [facility] records Per [facility] records    Patient Measurements: Height: 6' (182.9 cm) Weight: 87.2 kg (192 lb 3.9 oz) IBW/kg (Calculated) : 77.6 Heparin Dosing Weight: 84.9 kg   Vital Signs: Temp: 98.1 F (36.7 C) (01/03 2000) Temp Source: Oral (01/03 2000) BP: 129/65 (01/03 2000) Pulse Rate: 70 (01/03 2000)  Labs: Recent Labs    08/27/22 0859 08/28/22 0357 08/28/22 0447 08/28/22 0758 08/28/22 1426 08/28/22 2138 08/28/22 2307  HGB  --   --  11.3*  --   --   --   --   HCT  --   --  37.9*  --   --   --   --   PLT  --   --  271  --   --   --   --   APTT  --   --   --   --  69*  --  79*  CREATININE 1.58*  --  1.61*  --   --   --  1.80*  TROPONINIHS  --    < >  --  72* 2,572* 2,172*  --    < > = values in this interval not displayed.     Estimated Creatinine Clearance: 35.3 mL/min (A) (by C-G formula based on SCr of 1.8 mg/dL (H)).   Medical History: Past Medical History:  Diagnosis Date   (HFpEF) heart failure with preserved ejection fraction ([place])    Coronary artery disease    Diabetes mellitus without complication ([place])    Hyperlipidemia    Hypertension     Medications:  Medications Prior to Admission  Medication Sig Dispense Refill Last Dose   acetaminophen (TYLENOL) 325 MG tablet Take 650-1,300 mg by mouth 2 (two) times daily as needed.   Past Week   amiodarone (PACERONE) 200 MG tablet Take 100 mg by mouth daily.   Past Week   apixaban (ELIQUIS) 2.5 MG TABS tablet Take 2.5 mg by mouth 2 (two) times daily.   Past Week   atorvastatin (LIPITOR) 80 MG tablet TAKE ONE TABLET BY MOUTH AT BEDTIME FOR CHOLESTEROL   Past Week   Brinzolamide-Brimonidine 1-0.2 % SUSP Place 1 drop into the left eye 3 (three) times daily.   Past Week   carboxymethylcellulose 1  % ophthalmic solution Apply 1 drop to eye at bedtime.   Past Week   empagliflozin (JARDIANCE) 25 MG TABS tablet Take 12.5 mg by mouth every morning.   Past Week   furosemide (LASIX) 40 MG tablet Take 1 tablet (40 mg total) by mouth daily. 30 tablet 2 Past Week   metoprolol succinate (TOPROL-XL) 50 MG 24 hr tablet Take by mouth.   Past Week   nitroGLYCERIN (NITROSTAT) 0.4 MG SL tablet Place 1 tablet (0.4 mg total) under the tongue every 5 (five) minutes x 3 doses as needed for chest pain. 30 tablet 2 prn   pantoprazole (PROTONIX) 40 MG tablet Take 40 mg by mouth daily.   Past Week   polyethylene glycol (MIRALAX / GLYCOLAX) 17 g packet Take 17 g by mouth daily. 14 each 0 prn   Propylene Glycol 0.6 % SOLN Place 1 drop into both eyes 4 (four) times daily.   Past Week   sacubitril-valsartan (ENTRESTO) 24-26 MG Take 1 tablet by mouth every  12 (twelve) hours.   Past Week   spironolactone (ALDACTONE) 25 MG tablet Take 1 tablet by mouth daily.   Past Week   timolol (TIMOPTIC-XR) 0.5 % ophthalmic gel-forming Place 1 drop into both eyes daily.   Past Week   travoprost, benzalkonium, (TRAVATAN) 0.004 % ophthalmic solution Place 1 drop into both eyes nightly.   Past Week   vitamin B-12 (CYANOCOBALAMIN) 500 MCG tablet Take 1,000 mcg by mouth daily. Before breakfast   Past Week   apixaban (ELIQUIS) 5 MG TABS tablet Take 1 tablet (5 mg total) by mouth 2 (two) times daily. (Patient not taking: Reported on 08/27/2022) 60 tablet 0 Not Taking   clopidogrel (PLAVIX) 75 MG tablet Take 1 tablet (75 mg total) by mouth daily with breakfast. (Patient not taking: Reported on 08/22/2022) 30 tablet 2 Not Taking   glipiZIDE (GLUCOTROL) 5 MG tablet Take by mouth. (Patient not taking: Reported on 08/27/2022)   Not Taking   HYDROcodone-acetaminophen (NORCO/VICODIN) 5-325 MG tablet Take 1 tablet by mouth every 6 (six) hours as needed for moderate pain. (Patient not taking: Reported on 08/27/2022) 20 tablet 0 Not Taking   isosorbide  mononitrate (IMDUR) 60 MG 24 hr tablet Take 1 tablet (60 mg total) by mouth daily. (Patient not taking: Reported on 08/27/2022) 30 tablet 0 Not Taking    Assessment: Pharmacy consulted to dose heparin in this 82 year old male for ACS/NSTEMI.   Pt was on Eliquis 2.5 mg PO BID , last dose on 1/02 @ 2114. CrCl = 39.5 ml/min   Goal of Therapy:  Heparin level 0.3-0.7 units/ml aPTT 66 - 102  seconds Monitor platelets by anticoagulation protocol: Yes   Plan:  1/3:  aPTT @ 2307 = 79, therapeutic X 2 Will continue pt on current rate and recheck aPTT and HL on 1/4 with AM labs.  Tahir Blank D Clinical Pharmacist  08/29/2022 12:21 AM

## 2022-08-29 NOTE — TOC Progression Note (Signed)
Transition of Care [facility]) - Progression Note    Patient Details  Name: Bradley [name] MRN: [id number] Date of Birth: 1940-12-06  Transition of Care [facility]) CM/SW [place], [place] Phone Number: 08/29/2022, 4:09 PM  Clinical Narrative:     CSW notes per initial assessment  [facility] he sees a Dr. Devin Going. Lajuana Matte is the SW with the [place] for needed services- [phone].   TOC is following for discharge planning needs   Expected Discharge Plan: [place] Barriers to Discharge: Continued Medical Work up  Expected Discharge Plan and Services   Discharge Planning Services: CM Consult   Living arrangements for the past 2 months: Single Family Home                                       Social Determinants of Health (SDOH) Interventions SDOH Screenings   Food Insecurity: No Food Insecurity (08/23/2022)  Housing: Low Risk  (08/23/2022)  Transportation Needs: No Transportation Needs (08/23/2022)  Utilities: Not At Risk (08/23/2022)  Tobacco Use: Medium Risk (08/27/2022)    Readmission Risk Interventions     No data to display

## 2022-08-30 DIAGNOSIS — I509 Heart failure, unspecified: Secondary | ICD-10-CM | POA: Diagnosis not present

## 2022-08-30 DIAGNOSIS — I5043 Acute on chronic combined systolic (congestive) and diastolic (congestive) heart failure: Secondary | ICD-10-CM | POA: Diagnosis not present

## 2022-08-30 DIAGNOSIS — I214 Non-ST elevation (NSTEMI) myocardial infarction: Secondary | ICD-10-CM | POA: Diagnosis not present

## 2022-08-30 DIAGNOSIS — L03119 Cellulitis of unspecified part of limb: Secondary | ICD-10-CM | POA: Diagnosis not present

## 2022-08-30 DIAGNOSIS — I480 Paroxysmal atrial fibrillation: Secondary | ICD-10-CM | POA: Diagnosis not present

## 2022-08-30 LAB — BASIC METABOLIC PANEL
Anion gap: 8 (ref 5–15)
BUN: 51 mg/dL — ABNORMAL HIGH (ref 8–23)
CO2: 27 mmol/L (ref 22–32)
Calcium: 8.6 mg/dL — ABNORMAL LOW (ref 8.9–10.3)
Chloride: 101 mmol/L (ref 98–111)
Creatinine, Ser: 1.68 mg/dL — ABNORMAL HIGH (ref 0.61–1.24)
GFR, Estimated: 41 mL/min — ABNORMAL LOW (ref >60)
Glucose, Bld: 153 mg/dL — ABNORMAL HIGH (ref 70–99)
Potassium: 3.8 mmol/L (ref 3.5–5.1)
Sodium: 136 mmol/L (ref 135–145)

## 2022-08-30 LAB — CBC
HCT: 32.5 % — ABNORMAL LOW (ref 39.0–52.0)
Hemoglobin: 9.7 g/dL — ABNORMAL LOW (ref 13.0–17.0)
MCH: 28.6 pg (ref 26.0–34.0)
MCHC: 29.8 g/dL — ABNORMAL LOW (ref 30.0–36.0)
MCV: 95.9 fL (ref 80.0–100.0)
Platelets: 187 10*3/uL (ref 150–400)
RBC: 3.39 MIL/uL — ABNORMAL LOW (ref 4.22–5.81)
RDW: 21.5 % — ABNORMAL HIGH (ref 11.5–15.5)
WBC: 9.2 10*3/uL (ref 4.0–10.5)
nRBC: 0 % (ref 0.0–0.2)

## 2022-08-30 LAB — HEPARIN LEVEL (UNFRACTIONATED): Heparin Unfractionated: 0.61 IU/mL (ref 0.30–0.70)

## 2022-08-30 LAB — GLUCOSE, CAPILLARY
Glucose-Capillary: 143 mg/dL — ABNORMAL HIGH (ref 70–99)
Glucose-Capillary: 166 mg/dL — ABNORMAL HIGH (ref 70–99)
Glucose-Capillary: 147 mg/dL — ABNORMAL HIGH (ref 70–99)
Glucose-Capillary: 220 mg/dL — ABNORMAL HIGH (ref 70–99)

## 2022-08-30 LAB — APTT: aPTT: 80 seconds — ABNORMAL HIGH (ref 24–36)

## 2022-08-30 LAB — MAGNESIUM: Magnesium: 2 mg/dL (ref 1.7–2.4)

## 2022-08-30 NOTE — Progress Notes (Signed)
Rounding Note    Patient Name: Bradley [name] Date of Encounter: 08/30/2022  [place] Cardiologist: Ida Rogue, MD   Subjective    Reports breathing is slowly improving, still not perfect but did not have PND/orthopnea last night Denies chest pain concerning for angina Family at the bedside, concern for his immobility, inability to weight-bear on his right foot that has ulcers  Receiving IV Lasix 80 twice daily ,  -7.9 L total this admission  Inpatient Medications    Scheduled Meds:  amiodarone  100 mg Oral Daily   aspirin EC  81 mg Oral Daily   atorvastatin  80 mg Oral QHS   brinzolamide  1 drop Both Eyes TID   And   brimonidine  1 drop Both Eyes TID   Chlorhexidine Gluconate Cloth  6 each Topical Daily   collagenase   Topical Daily   cyanocobalamin  1,000 mcg Oral Daily   furosemide  80 mg Intravenous BID   insulin aspart  0-5 Units Subcutaneous QHS   insulin aspart  0-9 Units Subcutaneous TID WC   insulin aspart  3 Units Subcutaneous TID WC   metoprolol succinate  50 mg Oral Daily   pantoprazole  40 mg Oral Daily   polyethylene glycol  17 g Oral BID   sodium chloride flush  3 mL Intravenous Q12H   timolol  1 drop Both Eyes QHS   Travoprost (BAK Free)  1 drop Both Eyes QHS   Continuous Infusions:  sodium chloride Stopped (08/24/22 1617)   sodium chloride     heparin 1,100 Units/hr (08/30/22 0500)   PRN Meds: sodium chloride, sodium chloride, acetaminophen, albuterol, dextromethorphan-guaiFENesin, HYDROcodone-acetaminophen, morphine injection, nitroGLYCERIN, ondansetron (ZOFRAN) IV, oxyCODONE, sodium chloride flush   Vital Signs    Vitals:   08/30/22 0339 08/30/22 0732 08/30/22 1000 08/30/22 1207  BP: (!) 120/57 (!) 123/57  (!) 115/50  Pulse: 61 60  (!) 57  Resp: 18     Temp: 98.1 F (36.7 C) 98.4 F (36.9 C)  97.6 F (36.4 C)  TempSrc: Oral     SpO2: 91% 100% 94% 96%  Weight:      Height:        Intake/Output Summary (Last 24 hours)  at 08/30/2022 1217 Last data filed at 08/30/2022 1038 Gross per 24 hour  Intake 1039.7 ml  Output 2951 ml  Net -1911.3 ml      08/28/2022   10:55 AM 08/25/2022    7:00 AM 08/24/2022    7:51 AM  Last 3 Weights  Weight (lbs) 192 lb 3.9 oz 187 lb 1.6 oz 187 lb 12.8 oz  Weight (kg) 87.2 kg 84.868 kg 85.186 kg      Telemetry    NSR  - Personally Reviewed  ECG     - Personally Reviewed  Physical Exam   Constitutional:  oriented to person, place, and time. No distress.  HENT:  Head: Grossly normal Eyes:  no discharge. No scleral icterus.  Neck: No JVD, no carotid bruits  Cardiovascular: Regular rate and rhythm, no murmurs appreciated Pulmonary/Chest: Clear to auscultation bilaterally, no wheezes , scattered rales Abdominal: Soft.  no distension.  no tenderness.  Musculoskeletal: Normal range of motion Neurological:  normal muscle tone. Coordination normal. No atrophy Skin: Skin warm and dry Psychiatric: normal affect, pleasant   Labs    High Sensitivity Troponin:   Recent Labs  Lab 08/28/22 0357 08/28/22 0758 08/28/22 1426 08/28/22 2138 08/29/22 0518  TROPONINIHS 26* 72* 2,572* 2,172*  1,216*     Chemistry Recent Labs  Lab 08/24/22 0356 08/25/22 1115 08/28/22 0447 08/28/22 2307 08/29/22 0518 08/30/22 0730  NA 138   < > 133* 135  --  136  K 4.6   < > 5.1 4.2  --  3.8  CL 106   < > 99 100  --  101  CO2 24   < > 23 26  --  27  GLUCOSE 126*   < > 221* 221*  --  153*  BUN 58*   < > 43* 51*  --  51*  CREATININE 1.95*   < > 1.61* 1.80*  --  1.68*  CALCIUM 8.5*   < > 9.0 8.6*  --  8.6*  MG 2.5*  --  2.2  --  2.1 2.0  PROT 6.1*  --   --   --   --   --   ALBUMIN 3.0*  --   --   --   --   --   AST 16  --   --   --   --   --   ALT 33  --   --   --   --   --   ALKPHOS 180*  --   --   --   --   --   BILITOT 0.7  --   --   --   --   --   GFRNONAA 34*   < > 43* 37*  --  41*  ANIONGAP 8   < > 11 9  --  8   < > = values in this interval not displayed.    Lipids  No  results for input(s): "CHOL", "TRIG", "HDL", "LABVLDL", "LDLCALC", "CHOLHDL" in the last 168 hours.   Hematology Recent Labs  Lab 08/28/22 0447 08/29/22 0518 08/30/22 0730  WBC 14.9* 9.5 9.2  RBC 3.88* 3.32* 3.39*  HGB 11.3* 9.7* 9.7*  HCT 37.9* 31.9* 32.5*  MCV 97.7 96.1 95.9  MCH 29.1 29.2 28.6  MCHC 29.8* 30.4 29.8*  RDW 21.5* 21.6* 21.5*  PLT 271 183 187   Thyroid No results for input(s): "TSH", "FREET4" in the last 168 hours.  BNP No results for input(s): "BNP", "PROBNP" in the last 168 hours.   DDimer No results for input(s): "DDIMER" in the last 168 hours.   Radiology    No results found.  Cardiac Studies   Echo   1. Left ventricular ejection fraction, by estimation, is 40 to 45%. The  left ventricle has mildly decreased function. The left ventricle  demonstrates global hypokinesis with severe hypokinesis of the bassal  anterior and anteroseptal wall, and inferior  wall. Left ventricular diastolic parameters are consistent with Grade II  diastolic dysfunction (pseudonormalization). There is the interventricular  septum is flattened in systole and diastole, consistent with right  ventricular pressure and volume  overload.   2. Right ventricular systolic function is normal. The right ventricular  size is normal. There is moderately elevated pulmonary artery systolic  pressure. The estimated right ventricular systolic pressure is 43.1 mmHg.   3. Left atrial size was mildly dilated.   4. Right atrial size was mildly dilated.   5. The mitral valve is normal in structure. Mild to moderate mitral valve  regurgitation. No evidence of mitral stenosis.   6. Tricuspid valve regurgitation is mild to moderate.   7. The aortic valve is tricuspid. There is moderate calcification of the  aortic valve. Aortic valve regurgitation is  mild to moderate. Mild aortic  valve stenosis by Aortic valve mean gradient measures 14.3 mmHg. Aortic  valve Vmax measures 2.54  m/s.Moderate  stenosis by Aortic valve area, by VTI measures 0.89 cm.   8. The inferior vena cava is normal in size with greater than 50%  respiratory variability, suggesting right atrial pressure of 3 mmHg.   Patient Profile     82 y.o. 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   Assessment & Plan    Coronary artery disease with angina Episode of angina several days ago,  No additional anginal symptoms past 48 hours cardiac catheterization less than 1 month ago at outside facility showing stable severe three-vessel disease, grafts patent Off nitro infusion , continues on heparin infusion yesterday  -On aspirin, Lipitor Troponin continues to trend downward , peak 2500 on August 28, 2098, 1200 this morning despite events from yesterday -Medical management recommended by interventional cardiology  Acute on chronic diastolic and systolic CHF Significant shortness of breath t this admission concerning for hypervolemia, improved symptoms with Lasix IV 80 twice daily, stable renal function Jardiance, spironolactone on hold for now Consider transition to Lasix 40 mg daily through the weekend Incentive spirometer  Arctic valve stenosis Moderate, outpatient follow-up  PAD with critical limb ischemia Status post right external iliac stenting 2 days ago Occlusion of right SFA On aspirin, heparin, Eliquis on hold for now Unclear whether additional intervention needed, he continues to have pain in the foot with slow healing ulcers, unable to weight-bear  Paroxysmal atrial fibrillation  on amiodarone and metoprolol, on heparin infusion Maintaining normal sinus rhythm  Acute on chronic kidney failure Will  continue to monitor in the setting of aggressive diuretics Recently received contrast for PV procedure  Deconditioning/slow healing foot ulcers May need placement at skilled nursing as he is alone, he is unable to ambulate well   Total encounter time more than 50 minutes  Greater than 50% was spent in counseling and coordination of care with the patient    For questions or updates, please contact [place] Please consult [email or web address] for contact info under        Signed, Ida Rogue, MD  08/30/2022, 12:17 PM

## 2022-08-30 NOTE — Progress Notes (Signed)
Mobility Specialist - Progress Note   08/30/22 1139  Mobility  Activity  (bed level exercises)  Level of Assistance Independent  Distance Ambulated (ft) 0 ft  Range of Motion/Exercises Active;Right leg;Left leg  Activity Response Tolerated well  $Mobility charge 1 Mobility   Pt in fowler position upon entry, utilizing RA. Pt denied OOB ambulation, very adamant about not being able to walk due to R heel pain. Pt educated on NWB and WBAT methods, however still denied ambulation, states "if I could walk I would".Pt completed bed levels exercises, left in fowler position with needs within reach. Author recommending PT eval to this date.   Candie [place] Mobility Specialist 08/30/22 11:43 AM

## 2022-08-30 NOTE — Evaluation (Signed)
Physical Therapy Evaluation Patient Details Name: Bradley Bradley [name] MRN: [id number] DOB: 1941-06-30 Today's Date: 08/30/2022  History of Present Illness  Bradley Bradley [name] is an 81yoM who comes to [facility] on 08/22/22 Bradley [name] CP and LEE. PMH: CHF, CAD, DM, HTN, HLD. Pt underwent vascular surgery 08/27/22 Bradley [name] Dr. Gilda Crease, PTA stent Rt EIA. Cariology following 2/2 elevated troponin and angina in the setting of known 3 vessel disease. Pt admitted Dec 6-15 at [facility], DC back to home alone without any home services, had support from niece, not mobilizing very much due to ongoing pain along Rt heel to lower calf/achilles pain.  Clinical Impression  Pt asleep on entry, niece at bedside who helps wake him, pt agreeable to assessment, but is encouraged to move one step at a time as his Rt foot pain has been persistently limiting for a while now. RN coordinates analgesia prior to session. Pt hesitant earlier to get OOB b/Bradley [name] concern his Rt heel/achilles would hurt more, but this session, he tolerates some EOB sitting with worse pain in dependent position, then agreeable to attempt rising to standing with use of falls mat to cushion feet. Here he tolerates standing x3 minutes with RW support. Pt expressed concerns of dependence on RW, hence Bradley Bradley [name] provides supportive education on using the 'right equipment for the right task' and to consider what mobility tolerance changes could be achieved with use of the right equipment. At end of session, we all agree that AMB likely better tolerated with use of new cushioned diabetic shoes which niece will plan on bringing to hospital tomorrow pending wintry mix forecast. Pt appears to have good strength today and good upright tolerance absent any SOB, tachycardia, dizziness. Pt is well poised to take his rehab at home where he will have countless more opportunities to mobilize throughout the day and once he is more tolerant of weightbearing activity can be evaluated more in depth by HHPT for  additional acute deficits in transfers and AMB.      Recommendations for follow up therapy are one component of a multi-disciplinary discharge planning process, led by the attending physician.  Recommendations may be updated based on patient status, additional functional criteria and insurance authorization.  Follow Up Recommendations Home health PT      Assistance Recommended at Discharge Set up Supervision/Assistance  Patient can return home with the following  A little help with bathing/dressing/bathroom;Assistance with cooking/housework;Help with stairs or ramp for entrance;Assist for transportation;Direct supervision/assist for financial management;Direct supervision/assist for medications management    Equipment Recommendations None recommended by PT  Recommendations for Other Services       Functional Status Assessment Patient has had a recent decline in their functional status and demonstrates the ability to make significant improvements in function in a reasonable and predictable amount of time.     Precautions / Restrictions Precautions Precautions: Fall Restrictions Weight Bearing Restrictions: No      Mobility  Bed Mobility Overal bed mobility: Modified Independent             General bed mobility comments: good strength/powert moving fromsupine to Rt EOB    Transfers Overall transfer level: Needs assistance Equipment used: Rolling walker (2 wheels) Transfers: Sit to/from Stand Sit to Stand: Supervision           General transfer comment: 2 times in session, variable bed heights, once with bed rail, once with RW, author places cushion falls mat on floor to facilitate antalgia in weightbearing on tile    Ambulation/Gait Ambulation/Gait assistance:  (  deferred at present given utility from mat and lack of availabel footwear, niece will try to bring tomorrow for AMB trial)                Stairs            Wheelchair Mobility    Modified  Rankin (Stroke Patients Only)       Balance Overall balance assessment: Modified Independent (stands bedside with RW x3 minutes)                                           Pertinent Vitals/Pain Pain Assessment Pain Assessment: Faces Faces Pain Scale: Hurts little more Pain Location: Rt posterior heel/achilles (grimaces with palption of several spots in this region. Pain Descriptors / Indicators: Tender, Aching Pain Intervention(s): Limited activity within patient's tolerance, Monitored during session, Premedicated before session, Repositioned    Home Living Family/patient expects to be discharged to:: Private residence Living Arrangements: Alone Available Help at Discharge: Family;Friend(s);Available PRN/intermittently Type of Home: House Home Access: Stairs to enter Entrance Stairs-Rails:  (grabs a post \) Entrance Stairs-Number of Steps: 3   Home Layout: One level Home Equipment: Conservation officer, nature (2 wheels);Cane - single point;Shower seat      Prior Function Prior Level of Function : Independent/Modified Independent;Driving;Working/employed             Mobility Comments: SPC recently due to pain: ADLs Comments: independent with ADLs     Hand Dominance        Extremity/Trunk Assessment                Communication      Cognition Arousal/Alertness: Awake/alert Behavior During Therapy: WFL for tasks assessed/performed Overall Cognitive Status: Within Functional Limits for tasks assessed                                          General Comments      Exercises     Assessment/Plan    PT Assessment Patient needs continued PT services  PT Problem List Decreased activity tolerance;Decreased mobility;Decreased balance;Decreased knowledge of precautions;Decreased safety awareness       PT Treatment Interventions DME instruction;Gait training;Stair training;Functional mobility training;Therapeutic activities;Therapeutic  exercise;Balance training;Cognitive remediation;Patient/family education    PT Goals (Current goals can be found in the Care Plan section)  Acute Rehab PT Goals Patient Stated Goal: better pain control for foot PT Goal Formulation: With patient Time For Goal Achievement: 09/13/22 Potential to Achieve Goals: Good    Frequency Min 2X/week     Co-evaluation               AM-PAC PT "6 Clicks" Mobility  Outcome Measure Help needed turning from your back to your side while in a flat bed without using bedrails?: None Help needed moving from lying on your back to sitting on the side of a flat bed without using bedrails?: None Help needed moving to and from a bed to a chair (including a wheelchair)?: None Help needed standing up from a chair using your arms (e.g., wheelchair or bedside chair)?: None Help needed to walk in hospital room?: A Little Help needed climbing 3-5 steps with a railing? : A Little 6 Click Score: 22    End of Session   Activity Tolerance: Patient tolerated treatment  well;Patient limited by pain Patient left: in bed;with family/visitor present;with call bell/phone within reach (seted EOB as preferred so he can 'work his legs') Nurse Communication: Mobility status PT Visit Diagnosis: Other abnormalities of gait and mobility (R26.89);Unsteadiness on feet (R26.81)    Time: 1561-5379 PT Time Calculation (min) (ACUTE ONLY): 38 min   Charges:   PT Evaluation $PT Eval Moderate Complexity: 1 Mod PT Treatments $Therapeutic Activity: 8-22 mins      2:46 PM, 08/30/22 [name], PT, DPT Physical Therapist - [facility]  [phone] ([place])    Bradley Bradley [name] 08/30/2022, 2:40 PM

## 2022-08-30 NOTE — Progress Notes (Signed)
Progress Note    08/30/2022 3:27 PM 3 Days Post-Op  Subjective:  Bradley [name] is a 82 y.o. 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    In the emergency department the patient was found to have BNP greater than 4500.  He has worsening bilateral leg edema. Both legs are oozing fluid. He has multiple skin tear in legs. According to the patient he was sent from the [place] to our hospital with concerns for leg cellulitis.    On 08/28/2022 Bradley [name] underwent a percutaneous transluminal angioplasty and stent placement of his right iliac artery.  Puncture site is left groin which is clean dry and intact today.  No signs or symptoms of infection, hematoma, or seroma.  Patient's right lower extremity is warm to touch today.  He has a Doppler pedal pulse.  Patient states his leg feels better every day.  No complaints of pain.   Vitals:   08/30/22 1000 08/30/22 1207  BP:  (!) 115/50  Pulse:  (!) 57  Resp:    Temp:  97.6 F (36.4 C)  SpO2: 94% 96%   Physical Exam: Cardiac:  Regular rate and rhythm. Patient is no longer on Nitroglycerin drip.  Lungs:  Clear throughout except rales bilateral bases.  Incisions:  Left groin clean dry and intact, no hematoma or seroma.  Extremities:  Right lower extremity warm to touch with Doppler dorsalis pedis pulse. Open sore/ulcer to top of right foot. See media.    Abdomen:  Soft, nontender, positive bowel sounds all quadrants  Neurologic: AAOx3 to person place and time   CBC    Component Value Date/Time   WBC 9.2 08/30/2022 0730   RBC 3.39 (L) 08/30/2022 0730   HGB 9.7 (L) 08/30/2022 0730   HCT 32.5 (L) 08/30/2022 0730   PLT 187 08/30/2022 0730   MCV 95.9 08/30/2022 0730   MCH 28.6 08/30/2022 0730   MCHC 29.8 (L) 08/30/2022 0730   RDW 21.5 (H) 08/30/2022 0730   LYMPHSABS 1.8 08/24/2022 0356    MONOABS 1.0 08/24/2022 0356   EOSABS 0.2 08/24/2022 0356   BASOSABS 0.1 08/24/2022 0356    BMET    Component Value Date/Time   NA 136 08/30/2022 0730   K 3.8 08/30/2022 0730   CL 101 08/30/2022 0730   CO2 27 08/30/2022 0730   GLUCOSE 153 (H) 08/30/2022 0730   BUN 51 (H) 08/30/2022 0730   CREATININE 1.68 (H) 08/30/2022 0730   CALCIUM 8.6 (L) 08/30/2022 0730   GFRNONAA 41 (L) 08/30/2022 0730   GFRAA >60 03/14/2018 0551    INR    Component Value Date/Time   INR 1.2 03/11/2022 2322     Intake/Output Summary (Last 24 hours) at 08/30/2022 1527 Last data filed at 08/30/2022 1357 Gross per 24 hour  Intake 1279.7 ml  Output 3175 ml  Net -1895.3 ml     Assessment/Plan:  82 y.o. male is s/p percutaneous transluminal angioplasty and stent placement of his right iliac artery, and is recovering as expected.   3 Days Post-Op   Per Dr. Ella Jubilee MD, Patient is status post successful reconstruction of the inflow with an excellent result for the external iliac artery on the right. The long segment flush SFA occlusion is more problematic. There is possibility to attempt crossing in a retrograde fashion perhaps via the posterior tibial artery. He is not  a candidate for a right femoral to above-knee popliteal artery bypass at this time due to his comorbidity's and his need for nitroglycerin drip post procedure due to extreme chest pain.   Plan is to discharge home with outpatient wound care. We will see him back in the office for a follow up evaluation. The hope is he now has enough blood flow to heal his ulcers and he won't need general anesthesia with a 4 hour surgery.    Drema Pry [facility] 08/30/2022 3:27 PM

## 2022-08-30 NOTE — Progress Notes (Signed)
[place] for Heparin  Indication: chest pain/ACS and afib  Allergies  Allergen Reactions   Simvastatin Other (See Comments)    Per [place] records Per [facility] records  Per [facility] records Per [facility] records    Patient Measurements: Height: 6' (182.9 cm) Weight: 87.2 kg (192 lb 3.9 oz) IBW/kg (Calculated) : 77.6 Heparin Dosing Weight: 84.9 kg   Vital Signs: Temp: 98.4 F (36.9 C) (01/05 0732) Temp Source: Oral (01/05 0339) BP: 123/57 (01/05 0732) Pulse Rate: 60 (01/05 0732)  Labs: Recent Labs    08/28/22 0447 08/28/22 0758 08/28/22 1426 08/28/22 2138 08/28/22 2307 08/29/22 0518 08/30/22 0730  HGB 11.3*  --   --   --   --  9.7* 9.7*  HCT 37.9*  --   --   --   --  31.9* 32.5*  PLT 271  --   --   --   --  183 187  APTT  --    < > 69*  --  79* 71* 80*  HEPARINUNFRC  --   --   --   --   --  1.10* 0.61  CREATININE 1.61*  --   --   --  1.80*  --  1.68*  TROPONINIHS  --    < > 2,572* 2,172*  --  1,216*  --    < > = values in this interval not displayed.     Estimated Creatinine Clearance: 37.9 mL/min (A) (by C-G formula based on SCr of 1.68 mg/dL (H)).   Medical History: Past Medical History:  Diagnosis Date   (HFpEF) heart failure with preserved ejection fraction ([place])    Coronary artery disease    Diabetes mellitus without complication ([place])    Hyperlipidemia    Hypertension     Medications:  Medications Prior to Admission  Medication Sig Dispense Refill Last Dose   acetaminophen (TYLENOL) 325 MG tablet Take 650-1,300 mg by mouth 2 (two) times daily as needed.   Past Week   amiodarone (PACERONE) 200 MG tablet Take 100 mg by mouth daily.   Past Week   apixaban (ELIQUIS) 2.5 MG TABS tablet Take 2.5 mg by mouth 2 (two) times daily.   Past Week   atorvastatin (LIPITOR) 80 MG tablet TAKE ONE TABLET BY MOUTH AT BEDTIME FOR CHOLESTEROL   Past Week   Brinzolamide-Brimonidine 1-0.2 % SUSP Place 1 drop into the left eye 3 (three) times  daily.   Past Week   carboxymethylcellulose 1 % ophthalmic solution Apply 1 drop to eye at bedtime.   Past Week   empagliflozin (JARDIANCE) 25 MG TABS tablet Take 12.5 mg by mouth every morning.   Past Week   furosemide (LASIX) 40 MG tablet Take 1 tablet (40 mg total) by mouth daily. 30 tablet 2 Past Week   metoprolol succinate (TOPROL-XL) 50 MG 24 hr tablet Take by mouth.   Past Week   nitroGLYCERIN (NITROSTAT) 0.4 MG SL tablet Place 1 tablet (0.4 mg total) under the tongue every 5 (five) minutes x 3 doses as needed for chest pain. 30 tablet 2 prn   pantoprazole (PROTONIX) 40 MG tablet Take 40 mg by mouth daily.   Past Week   polyethylene glycol (MIRALAX / GLYCOLAX) 17 g packet Take 17 g by mouth daily. 14 each 0 prn   Propylene Glycol 0.6 % SOLN Place 1 drop into both eyes 4 (four) times daily.   Past Week   sacubitril-valsartan (ENTRESTO) 24-26 MG Take 1 tablet  by mouth every 12 (twelve) hours.   Past Week   spironolactone (ALDACTONE) 25 MG tablet Take 1 tablet by mouth daily.   Past Week   timolol (TIMOPTIC-XR) 0.5 % ophthalmic gel-forming Place 1 drop into both eyes daily.   Past Week   travoprost, benzalkonium, (TRAVATAN) 0.004 % ophthalmic solution Place 1 drop into both eyes nightly.   Past Week   vitamin B-12 (CYANOCOBALAMIN) 500 MCG tablet Take 1,000 mcg by mouth daily. Before breakfast   Past Week   apixaban (ELIQUIS) 5 MG TABS tablet Take 1 tablet (5 mg total) by mouth 2 (two) times daily. (Patient not taking: Reported on 08/27/2022) 60 tablet 0 Not Taking   clopidogrel (PLAVIX) 75 MG tablet Take 1 tablet (75 mg total) by mouth daily with breakfast. (Patient not taking: Reported on 08/22/2022) 30 tablet 2 Not Taking   glipiZIDE (GLUCOTROL) 5 MG tablet Take by mouth. (Patient not taking: Reported on 08/27/2022)   Not Taking   HYDROcodone-acetaminophen (NORCO/VICODIN) 5-325 MG tablet Take 1 tablet by mouth every 6 (six) hours as needed for moderate pain. (Patient not taking: Reported on  08/27/2022) 20 tablet 0 Not Taking   isosorbide mononitrate (IMDUR) 60 MG 24 hr tablet Take 1 tablet (60 mg total) by mouth daily. (Patient not taking: Reported on 08/27/2022) 30 tablet 0 Not Taking    Assessment: 82 y.o. 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 Pt was on apixaban PTA. Will use aPTT to monitor heparin.    1/4 0518 HL 1.1 aPTT 71 1/5 0730 HL 0.61 aPTT 80    Goal of Therapy:  Heparin level 0.3-0.7 units/ml once aPTT and heparin level correlate.  aPTT 66 - 102  seconds Monitor platelets by anticoagulation protocol: Yes   Plan:  aPTT is therapeutic, starting to correlate with heparin level. Will continue heparin infusion at 1100 units/hr. Will recheck heparin level and aPTT with CBC with AM labs. If heparin level trends down or remains stable, may switch to heparin level monitoring.   Oswald Hillock, PharmD, BCPS Clinical Pharmacist  08/30/2022 8:10 AM

## 2022-08-30 NOTE — Progress Notes (Signed)
PROGRESS NOTE    Bradley [name]  UJW:[id number] DOB: 1941/05/03 DOA: 08/22/2022 PCP: System, Provider Not In  254A/254A-AA  LOS: 8 days   Brief hospital course: Bradley [name] is a 82 y.o. 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 Patient stated that he has chronic shortness of breath, which has been progressively worsening in the past several weeks, much worse day of arrival to ED, assoc w/ bilateral leg edema and legs oozing fluid. According to the patient he was sent from the [place] to our hospital with concerns for leg cellulitis. 12/28: pt was found to have BNP > 4500,  WBC 11.1, potassium 5.7, worsening renal function w/ Cr 2.84, VSS 121/54, oxygen saturation 100% on room air.  Chest x-ray showed low volume without infiltration.  Patient was admitted to telemetry bed as inpatient for tx CHF 12/29: Cardiology consulted  12/30: Debridement R foot wound w/ podiatry. Vascular surgery planning angiography pending improvementin renal function. 12/31: renal function about same Cr 1.99. Net IO Since Admission: -4,208.77 mL [08/25/22 1516] 01/01: renal fxn improving to Cr 1.78. Angiography planned for tomorrow  01/02: LE angiography w/ stent to R external iliac artery. Consideration for right femoral to above-knee popliteal artery bypass will be discussed w/ patient   Assessment & Plan:  Acute on chronic combined systolic and diastolic CHF (congestive heart failure) (HCC) 2D echo on 03/12/2022 showed EF of 40-45% with grade 2 diastolic dysfunction.  BNP> 4500. 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 --Podiatry --> saw patient 12/30 and performed excisional debridement of the ulcerative areas on the right forefoot.  Plan: --cont dressing change per order  Right foot/ankle pain --pain appears to be located around achillis tender, not where ulcers are.  Both flexion and extension of right ankle causes pain, and pt unable to bear weight on it. --MRI right ankle    PVD (peripheral vascular disease) (HCC) S/p LE angiography w/ stent to R external iliac artery on 08/28/22 --pt has improved perfusion after the stent placement. Plan: --cont Asa and statin --due to recent NSTEMI, too soon for right femoral to above-knee popliteal artery bypass, will follow up with Dr. Delana Meyer 3-4 weeks after discharge.   Acute renal failure superimposed on stage 3a chronic kidney disease ([place]):  Baseline creatinine 1.38 on 03/14/2022.   On Admission, Cr 2.84 --Holding Entresto, spironolactone, Jardiance --monitor Cr while diuresing   NSTEMI CAD (coronary artery disease status post CABG and prior PCI --underwent coronary angiography at [place] less than a month ago, showing stable findings of severe native CAD, patent LIMA-LAD and SVG-OM, and patent SVG-R PLA with severe disease at the distal anastomosis that could could not be intervened upon previously at [facility].  --chest pain morning of 1/3 with increasing trop from 72 to 2572, trending down now. Plan: --cont heparin gtt --cont ASA and statin   Essential Hypertension --cont Toprol   Moderate aortic stenosis Recent echo done showed moderate aortic stenosis at [facility] TAVR is not indicated at this time and cardiology is following   HLD (hyperlipidemia) Lipid Panel done and showed a total cholesterol/HDL ratio of 2.3, cholesterol level 175, HDL 33,  LDL 17, triglycerides 125, VLDL 25 Statin Therapy with Atorvastatin    Type II diabetes mellitus with renal manifestations [facility]):  Recent A1c 7.8.   Poorly controlled.   Patient taking  glipizide and Jardiance at home which are held --SSI --mealtime 3u TID   Paroxysmal atrial fibrillation (HCC) s/p Cardioversion Amiodarone 100 mg po daily and Metoprolol Succinate 50 mg po Daily --cont heparin gtt    Hyperkalemia:  S/p Loklema --monitor   Normocytic Anemia --No overt bleeding noted monitor CBC    DVT prophylaxis: [id number] gtt Code Status: DNR  Family Communication:  Level of care: Progressive Dispo:   The patient is from: home Anticipated d/c is to: home Anticipated d/c date is: 1-2 days   Subjective and Interval History:  Pt reported dyspnea improved.  Complained of right foot pain causing him to be unable to bear weight.  MRI right ankle ordered.   Objective: Vitals:   08/30/22 0732 08/30/22 1000 08/30/22 1207 08/30/22 1538  BP: (!) 123/57  (!) 115/50 (!) 127/57  Pulse: 60  (!) 57 (!) 59  Resp:      Temp: 98.4 F (36.9 C)  97.6 F (36.4 C) 97.7 F (36.5 C)  TempSrc:      SpO2: 100% 94% 96% 96%  Weight:      Height:        Intake/Output Summary (Last 24 hours) at 08/30/2022 1923 Last data filed at 08/30/2022 1357 Gross per 24 hour  Intake 1039.7 ml  Output 2575 ml  Net -1535.3 ml   Filed Weights   08/24/22 0751 08/25/22 0700 08/28/22 1055  Weight: 85.2 kg 84.9 kg 87.2 kg    Examination:   Constitutional: NAD, AAOx3 HEENT: conjunctivae and lids normal, EOMI CV: No cyanosis.   RESP: normal respiratory effort, on RA SKIN: warm, dry.  Superficial ulcers present over right lower leg and dorsum foot. Neuro: II - XII grossly intact.   Psych: Normal mood and affect.  Appropriate judgement and reason   Data Reviewed: I have personally reviewed labs and imaging studies  Time spent: 50 minutes  Enzo Bi, MD [facility] If 7PM-7AM, please contact night-coverage 08/30/2022, 7:23 PM

## 2022-08-30 NOTE — Hospital Course (Signed)
Minus 1.9/7.8. No wt in 2 days

## 2022-08-31 ENCOUNTER — Inpatient Hospital Stay: Payer: No Typology Code available for payment source

## 2022-08-31 DIAGNOSIS — I5043 Acute on chronic combined systolic (congestive) and diastolic (congestive) heart failure: Secondary | ICD-10-CM | POA: Diagnosis not present

## 2022-08-31 DIAGNOSIS — I739 Peripheral vascular disease, unspecified: Secondary | ICD-10-CM | POA: Diagnosis not present

## 2022-08-31 DIAGNOSIS — I2510 Atherosclerotic heart disease of native coronary artery without angina pectoris: Secondary | ICD-10-CM | POA: Diagnosis not present

## 2022-08-31 DIAGNOSIS — I480 Paroxysmal atrial fibrillation: Secondary | ICD-10-CM | POA: Diagnosis not present

## 2022-08-31 DIAGNOSIS — I2583 Coronary atherosclerosis due to lipid rich plaque: Secondary | ICD-10-CM

## 2022-08-31 LAB — CBC
HCT: 31.6 % — ABNORMAL LOW (ref 39.0–52.0)
Hemoglobin: 9.6 g/dL — ABNORMAL LOW (ref 13.0–17.0)
MCH: 29.3 pg (ref 26.0–34.0)
MCHC: 30.4 g/dL (ref 30.0–36.0)
MCV: 96.3 fL (ref 80.0–100.0)
Platelets: 178 10*3/uL (ref 150–400)
RBC: 3.28 MIL/uL — ABNORMAL LOW (ref 4.22–5.81)
RDW: 21.8 % — ABNORMAL HIGH (ref 11.5–15.5)
WBC: 8.1 10*3/uL (ref 4.0–10.5)
nRBC: 0 % (ref 0.0–0.2)

## 2022-08-31 LAB — GLUCOSE, CAPILLARY
Glucose-Capillary: 162 mg/dL — ABNORMAL HIGH (ref 70–99)
Glucose-Capillary: 238 mg/dL — ABNORMAL HIGH (ref 70–99)
Glucose-Capillary: 136 mg/dL — ABNORMAL HIGH (ref 70–99)
Glucose-Capillary: 204 mg/dL — ABNORMAL HIGH (ref 70–99)

## 2022-08-31 LAB — BASIC METABOLIC PANEL
Anion gap: 10 (ref 5–15)
BUN: 51 mg/dL — ABNORMAL HIGH (ref 8–23)
CO2: 27 mmol/L (ref 22–32)
Calcium: 8.8 mg/dL — ABNORMAL LOW (ref 8.9–10.3)
Chloride: 100 mmol/L (ref 98–111)
Creatinine, Ser: 1.69 mg/dL — ABNORMAL HIGH (ref 0.61–1.24)
GFR, Estimated: 40 mL/min — ABNORMAL LOW (ref >60)
Glucose, Bld: 178 mg/dL — ABNORMAL HIGH (ref 70–99)
Potassium: 3.9 mmol/L (ref 3.5–5.1)
Sodium: 137 mmol/L (ref 135–145)

## 2022-08-31 LAB — APTT: aPTT: 87 seconds — ABNORMAL HIGH (ref 24–36)

## 2022-08-31 LAB — HEPARIN LEVEL (UNFRACTIONATED): Heparin Unfractionated: 0.47 IU/mL (ref 0.30–0.70)

## 2022-08-31 LAB — MAGNESIUM: Magnesium: 1.9 mg/dL (ref 1.7–2.4)

## 2022-08-31 MED ORDER — APIXABAN 2.5 MG PO TABS
2.5000 mg | ORAL_TABLET | Freq: Two times a day (BID) | ORAL | Status: DC
  Administered 2022-08-31 – 2022-09-16 (×32): 2.5 mg via ORAL
  Filled 2022-08-31 (×33): qty 1

## 2022-08-31 MED ORDER — ISOSORBIDE MONONITRATE ER 30 MG PO TB24
30.0000 mg | ORAL_TABLET | Freq: Every day | ORAL | Status: DC
  Administered 2022-08-31 – 2022-09-16 (×17): 30 mg via ORAL
  Filled 2022-08-31 (×17): qty 1

## 2022-08-31 MED ORDER — SACUBITRIL-VALSARTAN 24-26 MG PO TABS
1.0000 | ORAL_TABLET | Freq: Two times a day (BID) | ORAL | Status: DC
  Administered 2022-08-31 – 2022-09-07 (×15): 1 via ORAL
  Filled 2022-08-31 (×15): qty 1

## 2022-08-31 MED ORDER — GADOBUTROL 1 MMOL/ML IV SOLN
9.0000 mL | Freq: Once | INTRAVENOUS | Status: AC | PRN
  Administered 2022-08-31: 9 mL via INTRAVENOUS

## 2022-08-31 NOTE — Progress Notes (Signed)
Postprocedural day #3, right external iliac stent:  Vital signs remained stable.  The right foot is actually fairly warm, patient acknowledges that this is much different than preprocedurally.  I discussed with him today the fact that he has significant medical comorbidities that would favor a period of observation to see if he will improve without needing a secondary procedure to address the infrainguinal arterial occlusive disease.  He has good flow through the deep femoral by clinical state.  He is on aspirin and heparin, eventually would be transitioned obviously to outpatient anticoagulation.  No immediate plans for intervention on the SFA, again with a period of observation given his medical comorbidities.  He may be able to avoid another procedure.

## 2022-08-31 NOTE — Progress Notes (Signed)
Mobility Specialist - Progress Note   08/31/22 0923  Mobility  Activity Ambulated with assistance in hallway;Stood at bedside;Dangled on edge of bed  Level of Assistance Standby assist, set-up cues, supervision of patient - no hands on  Assistive Device Front wheel walker  Distance Ambulated (ft) 50 ft  Activity Response Tolerated well  Mobility Referral Yes  $Mobility charge 1 Mobility   Pt in bed on RA upon arrival. Pt STS and ambulates in hallway SBA with no LOB noted. Pt left EOB with needs in reach and bed alarm set.   Gretchen Short  Mobility Specialist  08/31/22 9:24 AM

## 2022-08-31 NOTE — Progress Notes (Signed)
PROGRESS NOTE    Bradley [name]  ZHY:[id number] DOB: Oct 07, 1940 DOA: 08/22/2022 PCP: System, Provider Not In  254A/254A-AA  LOS: 9 days   Brief hospital course: Bradley [name] is a 82 y.o. 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 Patient stated that he has chronic shortness of breath, which has been progressively worsening in the past several weeks, much worse day of arrival to ED, assoc w/ bilateral leg edema and legs oozing fluid. According to the patient he was sent from the [place] to our hospital with concerns for leg cellulitis. 12/28: pt was found to have BNP > 4500,  WBC 11.1, potassium 5.7, worsening renal function w/ Cr 2.84, VSS 121/54, oxygen saturation 100% on room air.  Chest x-ray showed low volume without infiltration.  Patient was admitted to telemetry bed as inpatient for tx CHF 12/29: Cardiology consulted  12/30: Debridement R foot wound w/ podiatry. Vascular surgery planning angiography pending improvementin renal function. 12/31: renal function about same Cr 1.99. Net IO Since Admission: -4,208.77 mL [08/25/22 1516] 01/01: renal fxn improving to Cr 1.78. Angiography planned for tomorrow  01/02: LE angiography w/ stent to R external iliac artery. Consideration for right femoral to above-knee popliteal artery bypass will be discussed w/ patient   Assessment & Plan:  Acute on chronic combined systolic and diastolic CHF (congestive heart failure) (HCC) 2D echo on 03/12/2022 showed EF of 40-45% with grade 2 diastolic dysfunction.  BNP> 4500. 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 --Podiatry -->  saw patient 12/30 and performed excisional debridement of the ulcerative areas on the right forefoot.  Plan: --cont dressing change per order  Right foot/ankle pain, resolved --pain appears to be located around achillis tender, not where ulcers are.  Both flexion and extension of right ankle causes pain, and pt unable to bear weight on it. --MRI right ankle no acute finding --pain suddenly resolved and now can bear weight.   PVD (peripheral vascular disease) ([place]) S/p LE angiography w/ stent to R external iliac artery on 08/28/22 --pt has improved perfusion after the stent placement. Plan: --cont Asa and statin --due to recent NSTEMI, too soon for right femoral to above-knee popliteal artery bypass, will follow up with Dr. Delana Meyer 3-4 weeks after discharge.   Acute renal failure superimposed on stage 3a chronic kidney disease ([place]):  Baseline creatinine 1.38 on 03/14/2022.   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  --chest pain morning of 1/3 with increasing trop from 72 to 2572, trending down now. --s/p heparin gtt Plan: --d/c heparin gtt today --cont ASA and statin   Essential Hypertension --cont Toprol   Moderate aortic stenosis Recent echo done showed moderate aortic stenosis at [facility] TAVR is not indicated at this time and cardiology is following   HLD (hyperlipidemia) Lipid Panel done and showed a total cholesterol/HDL ratio of  2.3, cholesterol level 175, HDL 33, LDL 17, triglycerides 125, VLDL 25 Statin Therapy with Atorvastatin    Type II diabetes mellitus with renal manifestations (HCC):  Recent A1c 7.8.   Poorly controlled.    Patient taking glipizide and Jardiance at home which are held --SSI --mealtime 3u TID   Paroxysmal atrial fibrillation (HCC) s/p Cardioversion Amiodarone 100 mg po daily and Metoprolol Succinate 50 mg po Daily --d/c heparin gtt and resume home Eliquis today   Hyperkalemia:  S/p Loklema --monitor   Normocytic Anemia --No overt bleeding noted monitor CBC    DVT prophylaxis: [id number] Code Status: DNR  Family Communication:  Level of care: Progressive Dispo:   The patient is from: home Anticipated d/c is to: home Anticipated d/c date is: 1-2 days   Subjective and Interval History:  Pt reported that during transfer for MRI, his right foot pain suddenly resolved, and he was able to put weight on it.  Pt was able to walk today.   Objective: Vitals:   08/31/22 0404 08/31/22 0500 08/31/22 0739 08/31/22 1136  BP: (!) 112/58  130/60 (!) 115/53  Pulse: (!) 59  68 (!) 59  Resp: 20  19 18   Temp: 98.4 F (36.9 C)  98 F (36.7 C)   TempSrc: Oral     SpO2: 98%  95% 97%  Weight:  87.1 kg    Height:        Intake/Output Summary (Last 24 hours) at 08/31/2022 1703 Last data filed at 08/31/2022 1137 Gross per 24 hour  Intake --  Output 1450 ml  Net -1450 ml   Filed Weights   08/25/22 0700 08/28/22 1055 08/31/22 0500  Weight: 84.9 kg 87.2 kg 87.1 kg    Examination:   Constitutional: NAD, AAOx3 HEENT: conjunctivae and lids normal, EOMI CV: No cyanosis.   RESP: normal respiratory effort, on RA SKIN: warm, dry Neuro: II - XII grossly intact.   Psych: Normal mood and affect.  Appropriate judgement and reason   Data Reviewed: I have personally reviewed labs and imaging studies  Time spent: 35 minutes  10/30/22, MD [facility] If 7PM-7AM, please contact night-coverage 08/31/2022, 5:03 PM

## 2022-08-31 NOTE — Progress Notes (Signed)
[place] for Heparin  Indication: chest pain/ACS and afib  Allergies  Allergen Reactions   Simvastatin Other (See Comments)    Per [place] records Per [facility] records  Per [facility] records Per [facility] records    Patient Measurements: Height: 6' (182.9 cm) Weight: 87.2 kg (192 lb 3.9 oz) IBW/kg (Calculated) : 77.6 Heparin Dosing Weight: 84.9 kg   Vital Signs: Temp: 98.4 F (36.9 C) (01/06 0404) Temp Source: Oral (01/06 0404) BP: 112/58 (01/06 0404) Pulse Rate: 59 (01/06 0404)  Labs: Recent Labs    08/28/22 1426 08/28/22 2138 08/28/22 2307 08/28/22 2307 08/29/22 0518 08/30/22 0730 08/31/22 0529  HGB  --   --   --    < > 9.7* 9.7* 9.6*  HCT  --   --   --   --  31.9* 32.5* 31.6*  PLT  --   --   --   --  183 187 178  APTT 69*  --  79*  --  71* 80* 87*  HEPARINUNFRC  --   --   --   --  1.10* 0.61 0.47  CREATININE  --   --  1.80*  --   --  1.68* 1.69*  TROPONINIHS 2,572* 2,172*  --   --  1,216*  --   --    < > = values in this interval not displayed.     Estimated Creatinine Clearance: 37.6 mL/min (A) (by C-G formula based on SCr of 1.69 mg/dL (H)).   Medical History: Past Medical History:  Diagnosis Date   (HFpEF) heart failure with preserved ejection fraction ([place])    Coronary artery disease    Diabetes mellitus without complication ([place])    Hyperlipidemia    Hypertension     Medications:  Medications Prior to Admission  Medication Sig Dispense Refill Last Dose   acetaminophen (TYLENOL) 325 MG tablet Take 650-1,300 mg by mouth 2 (two) times daily as needed.   Past Week   amiodarone (PACERONE) 200 MG tablet Take 100 mg by mouth daily.   Past Week   apixaban (ELIQUIS) 2.5 MG TABS tablet Take 2.5 mg by mouth 2 (two) times daily.   Past Week   atorvastatin (LIPITOR) 80 MG tablet TAKE ONE TABLET BY MOUTH AT BEDTIME FOR CHOLESTEROL   Past Week   Brinzolamide-Brimonidine 1-0.2 % SUSP Place 1 drop into the left eye 3 (three) times  daily.   Past Week   carboxymethylcellulose 1 % ophthalmic solution Apply 1 drop to eye at bedtime.   Past Week   empagliflozin (JARDIANCE) 25 MG TABS tablet Take 12.5 mg by mouth every morning.   Past Week   furosemide (LASIX) 40 MG tablet Take 1 tablet (40 mg total) by mouth daily. 30 tablet 2 Past Week   metoprolol succinate (TOPROL-XL) 50 MG 24 hr tablet Take by mouth.   Past Week   nitroGLYCERIN (NITROSTAT) 0.4 MG SL tablet Place 1 tablet (0.4 mg total) under the tongue every 5 (five) minutes x 3 doses as needed for chest pain. 30 tablet 2 prn   pantoprazole (PROTONIX) 40 MG tablet Take 40 mg by mouth daily.   Past Week   polyethylene glycol (MIRALAX / GLYCOLAX) 17 g packet Take 17 g by mouth daily. 14 each 0 prn   Propylene Glycol 0.6 % SOLN Place 1 drop into both eyes 4 (four) times daily.   Past Week   sacubitril-valsartan (ENTRESTO) 24-26 MG Take 1 tablet by mouth every 12 (twelve)  hours.   Past Week   spironolactone (ALDACTONE) 25 MG tablet Take 1 tablet by mouth daily.   Past Week   timolol (TIMOPTIC-XR) 0.5 % ophthalmic gel-forming Place 1 drop into both eyes daily.   Past Week   travoprost, benzalkonium, (TRAVATAN) 0.004 % ophthalmic solution Place 1 drop into both eyes nightly.   Past Week   vitamin B-12 (CYANOCOBALAMIN) 500 MCG tablet Take 1,000 mcg by mouth daily. Before breakfast   Past Week   apixaban (ELIQUIS) 5 MG TABS tablet Take 1 tablet (5 mg total) by mouth 2 (two) times daily. (Patient not taking: Reported on 08/27/2022) 60 tablet 0 Not Taking   clopidogrel (PLAVIX) 75 MG tablet Take 1 tablet (75 mg total) by mouth daily with breakfast. (Patient not taking: Reported on 08/22/2022) 30 tablet 2 Not Taking   glipiZIDE (GLUCOTROL) 5 MG tablet Take by mouth. (Patient not taking: Reported on 08/27/2022)   Not Taking   HYDROcodone-acetaminophen (NORCO/VICODIN) 5-325 MG tablet Take 1 tablet by mouth every 6 (six) hours as needed for moderate pain. (Patient not taking: Reported on  08/27/2022) 20 tablet 0 Not Taking   isosorbide mononitrate (IMDUR) 60 MG 24 hr tablet Take 1 tablet (60 mg total) by mouth daily. (Patient not taking: Reported on 08/27/2022) 30 tablet 0 Not Taking    Assessment: 82 y.o. 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 Pt was on apixaban PTA. Will use aPTT to monitor heparin.    1/4 0518 HL 1.1 aPTT 71 1/5 0730 HL 0.61 aPTT 80 1/6 0529 HL 0.47 aPTT 87    Goal of Therapy:  Heparin level 0.3-0.7 units/ml once aPTT and heparin level correlate.  aPTT 66 - 102  seconds Monitor platelets by anticoagulation protocol: Yes   Plan:  1/6 @ 0529:  aPTT = 87, HL = 0.47 - both therapeutic X 2 Will use HL to guide dosing from here on Will recheck HL on 1/7 with AM labs.   Scherrie Gerlach, PharmD Clinical Pharmacist  08/31/2022 6:01 AM

## 2022-08-31 NOTE — Progress Notes (Signed)
Mobility Specialist - Progress Note   08/31/22 1353  Mobility  Activity Ambulated with assistance in room;Transferred from chair to bed  Level of Assistance Standby assist, set-up cues, supervision of patient - no hands on  Assistive Device None  Distance Ambulated (ft) 5 ft  Activity Response Tolerated well  Mobility Referral Yes  $Mobility charge 1 Mobility   Pt sitting in recliner on RA upon arrival. Pt STS and ambulates from recliner to bed SBA. PT left in bed with needs in reach and bed alarm set.   Gretchen Short  Mobility Specialist  08/31/22 1:54 PM

## 2022-08-31 NOTE — Progress Notes (Signed)
BLE dressings changed.

## 2022-08-31 NOTE — Progress Notes (Signed)
Cardiology Progress Note   Patient Name: Bradley [name] Date of Encounter: 08/31/2022  Primary Cardiologist: Ida Rogue, MD  Subjective   Feels well this AM. R foot feels much better, no pain and was able to ambulate w/o chest pain or dyspnea.  Woke up this AM and his chest was "rattling."  Inpatient Medications    Scheduled Meds:  amiodarone  100 mg Oral Daily   aspirin EC  81 mg Oral Daily   atorvastatin  80 mg Oral QHS   brinzolamide  1 drop Both Eyes TID   And   brimonidine  1 drop Both Eyes TID   Chlorhexidine Gluconate Cloth  6 each Topical Daily   collagenase   Topical Daily   cyanocobalamin  1,000 mcg Oral Daily   furosemide  80 mg Intravenous BID   insulin aspart  0-5 Units Subcutaneous QHS   insulin aspart  0-9 Units Subcutaneous TID WC   insulin aspart  3 Units Subcutaneous TID WC   metoprolol succinate  50 mg Oral Daily   pantoprazole  40 mg Oral Daily   polyethylene glycol  17 g Oral BID   timolol  1 drop Both Eyes QHS   Travoprost (BAK Free)  1 drop Both Eyes QHS   Continuous Infusions:  sodium chloride Stopped (08/24/22 1617)   sodium chloride     heparin 1,100 Units/hr (08/31/22 0132)   PRN Meds: sodium chloride, sodium chloride, acetaminophen, albuterol, dextromethorphan-guaiFENesin, HYDROcodone-acetaminophen, morphine injection, nitroGLYCERIN, ondansetron (ZOFRAN) IV, oxyCODONE, sodium chloride flush   Vital Signs    Vitals:   08/31/22 0012 08/31/22 0404 08/31/22 0500 08/31/22 0739  BP: (!) 121/57 (!) 112/58  130/60  Pulse: (!) 58 (!) 59  68  Resp: 16 20  19   Temp: 98.7 F (37.1 C) 98.4 F (36.9 C)  98 F (36.7 C)  TempSrc: Oral Oral    SpO2: 96% 98%  95%  Weight:   87.1 kg   Height:        Intake/Output Summary (Last 24 hours) at 08/31/2022 0927 Last data filed at 08/30/2022 2143 Gross per 24 hour  Intake 600 ml  Output 1900 ml  Net -1300 ml   Filed Weights   08/25/22 0700 08/28/22 1055 08/31/22 0500  Weight: 84.9 kg 87.2 kg 87.1  kg    Physical Exam   GEN: Well nourished, well developed, in no acute distress.  HEENT: Grossly normal.  Neck: Supple, JVD to jaw, no carotid bruits or masses. Cardiac: RRR, 2/6 SEM throughout, no rubs or gallops. No clubbing, cyanosis.  Lower leg wrapped.  1+ bilat LE edema noted.  Respiratory:  Respirations regular and unlabored, coarse breath sounds w/ scattered wheezing throughout. GI: Semi-firm, nontender, BS + x 4. MS: no deformity or atrophy. Skin: warm and dry, no rash. Neuro:  Strength and sensation are intact. Psych: AAOx3.  Normal affect.  Labs    Chemistry Recent Labs  Lab 08/28/22 2307 08/30/22 0730 08/31/22 0529  NA 135 136 137  K 4.2 3.8 3.9  CL 100 101 100  CO2 26 27 27   GLUCOSE 221* 153* 178*  BUN 51* 51* 51*  CREATININE 1.80* 1.68* 1.69*  CALCIUM 8.6* 8.6* 8.8*  GFRNONAA 37* 41* 40*  ANIONGAP 9 8 10      Hematology Recent Labs  Lab 08/29/22 0518 08/30/22 0730 08/31/22 0529  WBC 9.5 9.2 8.1  RBC 3.32* 3.39* 3.28*  HGB 9.7* 9.7* 9.6*  HCT 31.9* 32.5* 31.6*  MCV 96.1 95.9 96.3  MCH  29.2 28.6 29.3  MCHC 30.4 29.8* 30.4  RDW 21.6* 21.5* 21.8*  PLT 183 187 178    Cardiac Enzymes  Recent Labs  Lab 08/28/22 0357 08/28/22 0758 08/28/22 1426 08/28/22 2138 08/29/22 0518  TROPONINIHS 26* 72* 2,572* 2,172* 1,216*      BNP    Component Value Date/Time   BNP >4,500.0 (H) 08/22/2022 1145   Lipids  Lab Results  Component Value Date   CHOL 75 08/22/2022   HDL 33 (L) 08/22/2022   LDLCALC 17 08/22/2022   TRIG 125 08/22/2022   CHOLHDL 2.3 08/22/2022    HbA1c  Lab Results  Component Value Date   HGBA1C 8.9 (H) 08/23/2022    Radiology    DG Chest Port 1 View  Result Date: 08/28/2022 CLINICAL DATA:  Chest pain tonight EXAM: PORTABLE CHEST 1 VIEW COMPARISON:  08/22/2022 FINDINGS: Cardiomegaly and vascular pedicle widening. Prior CABG. Stable aortic contours. Diffuse hazy appearance of the chest with interstitial opacity from [facility]  lines and small right pleural effusion. No pneumothorax. Artifact from EKG leads. IMPRESSION: CHF. Electronically Signed   By: Tiburcio Pea M.D.   On: 08/28/2022 04:14   PERIPHERAL VASCULAR CATHETERIZATION  Result Date: 08/27/2022 See surgical note for result.   Telemetry    RSR - Personally Reviewed  Cardiac Studies   Cardiac Catheterization  11.2022  Diagnostic Dominance: Right  *Med Rx _____________   Cardiac Catheterization  12.10.2023 ([facility])  1. Known native 3VD (RCA not injected, known CTO)  2. Patent LIMA-LAD  3. Patent free RIMA-OM3  4. SVG-RPL with distal 99% lesion on hairpin turn adjacent to distal anastomosis (unchanged compared with Jan 2023 cath)   Recommendations:   1. D/C left radial TR band per protocol  2. Unchanged coronary anatomy compared with Jan 2023 cath.  The SVG-RPL lesion was attempted previously unsuccessfully.  Given new symptoms, unlikely that this is a culprit for current CP.  3. Continue work-up/management for LFLG aortic stenosis.  _____________   2D Echocardiogram 1.3.2024   1. Left ventricular ejection fraction, by estimation, is 40 to 45%. The  left ventricle has mildly decreased function. The left ventricle  demonstrates global hypokinesis with severe hypokinesis of the bassal  anterior and anteroseptal wall, and inferior  wall. Left ventricular diastolic parameters are consistent with Grade II  diastolic dysfunction (pseudonormalization). There is the interventricular  septum is flattened in systole and diastole, consistent with right  ventricular pressure and volume  overload.   2. Right ventricular systolic function is normal. The right ventricular  size is normal. There is moderately elevated pulmonary artery systolic  pressure. The estimated right ventricular systolic pressure is 49.9 mmHg.   3. Left atrial size was mildly dilated.   4. Right atrial size was mildly dilated.   5. The mitral valve is normal in structure. Mild  to moderate mitral valve  regurgitation. No evidence of mitral stenosis.   6. Tricuspid valve regurgitation is mild to moderate.   7. The aortic valve is tricuspid. There is moderate calcification of the  aortic valve. Aortic valve regurgitation is mild to moderate. Mild aortic  valve stenosis by Aortic valve mean gradient measures 14.3 mmHg. Aortic  valve Vmax measures 2.54  m/s.Moderate stenosis by Aortic valve area, by VTI measures 0.89 cm.   8. The inferior vena cava is normal in size with greater than 50%  respiratory variability, suggesting right atrial pressure of 3 mmHg.  _____________   Patient Profile     82 y.o. male  with history of coronary artery disease status post three-vessel CABG (2016), HFrEF, moderate aortic stenosis, persistent atrial fibrillation, chronic kidney disease stage III, hypertension, hyperlipidemia, type 2 diabetes mellitus, and PAD status post right external iliac intervention (08/27/2022), whom we are following due to heart failure.   Assessment & Plan    1.  CAD/NSTEMI:  H/o CAD and CABG x 3 w/ last cath in 07/2022 w/ severe native multivessel CAD w/ patent LIMA  LAD, patent RIMA  OM3, and 99% distal lesion in VG  RPL w/ native RPL dzs  Med rx advised.  Presented 12/28 w/ lower ext edema and cellulitis.  Initially w/ mild HsTrop elevation followed by c/p on the morning of 1/3 and further rise in HsTrop to 2572.  Cath report reviewed by interventional cardiology w/ rec for ongoing med rx.  Ambulated last night w/o chest pain or dyspnea.  Now off of IV ntg.  Cont asa, statin, ? blocker, resume long-acting nitrate therapy.  2.  Acute on chronic HFmrEF:  EF 40-45% by echo this admission.  Has responded well to IV diuresis.  Minus 1.3L overnight and minus 9.1L since admission.  Wt has been variable - currently listed as up 2.2 kg since 12/31.  Renal fxn relatively stable. Coarse breath sounds on exam w/ ongoing JVD and mild lower ext edema.  Cont IV lasix - 80 BID.   Ideally would like to dry him out as this is his second admission in a month for CHF.  Home doses of entresto, jardiance, and spiro held during admission r/t AKI on CKD III.  Creat and BPs stable.  Would benefit from slow re-introduction of home meds, will start w/ low-dose entresto.  3.  PAD/foot ulcers:  S/p R iliac PTA and stenting this admission.  Residual SFA dzs w/ plan for outpt f/u and consideration for repeat procedure in the future.  His R foot feels much better this AM, denies heel pain, which had been present the past few days.  On ASA.  Transitioning heparin to eliquis as outlined above.  4.  PAF:  Maintaining sinus rhythm on amio and metoprolol.  Given no plans for additional invasive procedures, will transition heparin to reduced-dose eliquis.  5.  Moderate Ao Stenosis:  Recent eval @ [facility] w/ dobutamine echo  Follow as outpt.  6.  Acute on chronic stage III kidney dzs:  renal fxn stable.  Follow w/ diuresis.  7.  DMII:  A1c 8.9.  Insulin mgmt per IM.  Will plan to resume SGLT2i in near future.  Signed, Nicolasa Ducking, NP  08/31/2022, 9:27 AM    For questions or updates, please contact   Please consult [email or web address] for contact info under Cardiology/STEMI.

## 2022-09-01 ENCOUNTER — Inpatient Hospital Stay: Payer: No Typology Code available for payment source

## 2022-09-01 DIAGNOSIS — I2510 Atherosclerotic heart disease of native coronary artery without angina pectoris: Secondary | ICD-10-CM | POA: Diagnosis not present

## 2022-09-01 DIAGNOSIS — I480 Paroxysmal atrial fibrillation: Secondary | ICD-10-CM | POA: Diagnosis not present

## 2022-09-01 DIAGNOSIS — I5043 Acute on chronic combined systolic (congestive) and diastolic (congestive) heart failure: Secondary | ICD-10-CM | POA: Diagnosis not present

## 2022-09-01 DIAGNOSIS — I739 Peripheral vascular disease, unspecified: Secondary | ICD-10-CM | POA: Diagnosis not present

## 2022-09-01 LAB — BASIC METABOLIC PANEL
Anion gap: 10 (ref 5–15)
BUN: 51 mg/dL — ABNORMAL HIGH (ref 8–23)
CO2: 29 mmol/L (ref 22–32)
Calcium: 8.8 mg/dL — ABNORMAL LOW (ref 8.9–10.3)
Chloride: 98 mmol/L (ref 98–111)
Creatinine, Ser: 1.83 mg/dL — ABNORMAL HIGH (ref 0.61–1.24)
GFR, Estimated: 37 mL/min — ABNORMAL LOW (ref >60)
Glucose, Bld: 135 mg/dL — ABNORMAL HIGH (ref 70–99)
Potassium: 4.2 mmol/L (ref 3.5–5.1)
Sodium: 137 mmol/L (ref 135–145)

## 2022-09-01 LAB — CBC
HCT: 32.4 % — ABNORMAL LOW (ref 39.0–52.0)
Hemoglobin: 9.7 g/dL — ABNORMAL LOW (ref 13.0–17.0)
MCH: 29 pg (ref 26.0–34.0)
MCHC: 29.9 g/dL — ABNORMAL LOW (ref 30.0–36.0)
MCV: 96.7 fL (ref 80.0–100.0)
Platelets: 187 10*3/uL (ref 150–400)
RBC: 3.35 MIL/uL — ABNORMAL LOW (ref 4.22–5.81)
RDW: 21.6 % — ABNORMAL HIGH (ref 11.5–15.5)
WBC: 8 10*3/uL (ref 4.0–10.5)
nRBC: 0 % (ref 0.0–0.2)

## 2022-09-01 LAB — GLUCOSE, CAPILLARY
Glucose-Capillary: 117 mg/dL — ABNORMAL HIGH (ref 70–99)
Glucose-Capillary: 212 mg/dL — ABNORMAL HIGH (ref 70–99)
Glucose-Capillary: 227 mg/dL — ABNORMAL HIGH (ref 70–99)
Glucose-Capillary: 155 mg/dL — ABNORMAL HIGH (ref 70–99)

## 2022-09-01 LAB — HEPARIN LEVEL (UNFRACTIONATED): Heparin Unfractionated: 0.56 IU/mL (ref 0.30–0.70)

## 2022-09-01 LAB — MAGNESIUM: Magnesium: 1.9 mg/dL (ref 1.7–2.4)

## 2022-09-01 LAB — BRAIN NATRIURETIC PEPTIDE: B Natriuretic Peptide: 3682.6 pg/mL — ABNORMAL HIGH (ref 0.0–100.0)

## 2022-09-01 NOTE — Plan of Care (Signed)
  Problem: Education: Goal: Ability to demonstrate management of disease process will improve Outcome: Progressing Goal: Ability to verbalize understanding of medication therapies will improve Outcome: Progressing Goal: Individualized Educational Video(s) Outcome: Progressing   Problem: Activity: Goal: Capacity to carry out activities will improve Outcome: Progressing   Problem: Cardiac: Goal: Ability to achieve and maintain adequate cardiopulmonary perfusion will improve Outcome: Progressing   Problem: Education: Goal: Ability to describe self-care measures that may prevent or decrease complications (Diabetes Survival Skills Education) will improve Outcome: Progressing Goal: Individualized Educational Video(s) Outcome: Progressing   Problem: Coping: Goal: Ability to adjust to condition or change in health will improve Outcome: Progressing   Problem: Fluid Volume: Goal: Ability to maintain a balanced intake and output will improve Outcome: Progressing   Problem: Health Behavior/Discharge Planning: Goal: Ability to identify and utilize available resources and services will improve Outcome: Progressing Goal: Ability to manage health-related needs will improve Outcome: Progressing   Problem: Metabolic: Goal: Ability to maintain appropriate glucose levels will improve Outcome: Progressing   Problem: Nutritional: Goal: Maintenance of adequate nutrition will improve Outcome: Progressing Goal: Progress toward achieving an optimal weight will improve Outcome: Progressing   Problem: Skin Integrity: Goal: Risk for impaired skin integrity will decrease Outcome: Progressing   Problem: Tissue Perfusion: Goal: Adequacy of tissue perfusion will improve Outcome: Progressing   Problem: Education: Goal: Knowledge of General Education information will improve Description: Including pain rating scale, medication(s)/side effects and non-pharmacologic comfort measures Outcome:  Progressing   Problem: Health Behavior/Discharge Planning: Goal: Ability to manage health-related needs will improve Outcome: Progressing   Problem: Clinical Measurements: Goal: Ability to maintain clinical measurements within normal limits will improve Outcome: Progressing Goal: Will remain free from infection Outcome: Progressing Goal: Diagnostic test results will improve Outcome: Progressing Goal: Respiratory complications will improve Outcome: Progressing Goal: Cardiovascular complication will be avoided Outcome: Progressing   Problem: Activity: Goal: Risk for activity intolerance will decrease Outcome: Progressing   Problem: Nutrition: Goal: Adequate nutrition will be maintained Outcome: Progressing   Problem: Coping: Goal: Level of anxiety will decrease Outcome: Progressing   Problem: Elimination: Goal: Will not experience complications related to bowel motility Outcome: Progressing Goal: Will not experience complications related to urinary retention Outcome: Progressing   Problem: Pain Managment: Goal: General experience of comfort will improve Outcome: Progressing   Problem: Safety: Goal: Ability to remain free from injury will improve Outcome: Progressing   Problem: Skin Integrity: Goal: Risk for impaired skin integrity will decrease Outcome: Progressing   Problem: Education: Goal: Understanding of CV disease, CV risk reduction, and recovery process will improve Outcome: Progressing Goal: Individualized Educational Video(s) Outcome: Progressing   Problem: Activity: Goal: Ability to return to baseline activity level will improve Outcome: Progressing   Problem: Cardiovascular: Goal: Ability to achieve and maintain adequate cardiovascular perfusion will improve Outcome: Progressing Goal: Vascular access site(s) Level 0-1 will be maintained Outcome: Progressing   Problem: Health Behavior/Discharge Planning: Goal: Ability to safely manage  health-related needs after discharge will improve Outcome: Progressing   

## 2022-09-01 NOTE — Plan of Care (Signed)
Problem: Education: Goal: Ability to demonstrate management of disease process will improve 09/01/2022 1507 by Meko Bellanger P, RN Outcome: Progressing 09/01/2022 1507 by Sharol Given, RN Outcome: Progressing Goal: Ability to verbalize understanding of medication therapies will improve 09/01/2022 1507 by Reeshemah Nazaryan, Gerda Diss, RN Outcome: Progressing 09/01/2022 1507 by Sharol Given, RN Outcome: Progressing Goal: Individualized Educational Video(s) 09/01/2022 1507 by Sharol Given, RN Outcome: Progressing 09/01/2022 1507 by Sharol Given, RN Outcome: Progressing   Problem: Activity: Goal: Capacity to carry out activities will improve 09/01/2022 1507 by Arsen Mangione P, RN Outcome: Progressing 09/01/2022 1507 by Sharol Given, RN Outcome: Progressing   Problem: Cardiac: Goal: Ability to achieve and maintain adequate cardiopulmonary perfusion will improve 09/01/2022 1507 by Manda Holstad P, RN Outcome: Progressing 09/01/2022 1507 by Sharol Given, RN Outcome: Progressing   Problem: Education: Goal: Ability to describe self-care measures that may prevent or decrease complications (Diabetes Survival Skills Education) will improve 09/01/2022 1507 by Sharol Given, RN Outcome: Progressing 09/01/2022 1507 by Sharol Given, RN Outcome: Progressing Goal: Individualized Educational Video(s) 09/01/2022 1507 by Sharol Given, RN Outcome: Progressing 09/01/2022 1507 by Sharol Given, RN Outcome: Progressing   Problem: Coping: Goal: Ability to adjust to condition or change in health will improve 09/01/2022 1507 by Kyian Obst P, RN Outcome: Progressing 09/01/2022 1507 by Sharol Given, RN Outcome: Progressing   Problem: Fluid Volume: Goal: Ability to maintain a balanced intake and output will improve 09/01/2022 1507 by Laruen Risser P, RN Outcome: Progressing 09/01/2022 1507 by Sharol Given, RN Outcome: Progressing   Problem: Health  Behavior/Discharge Planning: Goal: Ability to identify and utilize available resources and services will improve 09/01/2022 1507 by Tamara Monteith P, RN Outcome: Progressing 09/01/2022 1507 by Sharol Given, RN Outcome: Progressing Goal: Ability to manage health-related needs will improve 09/01/2022 1507 by Arber Wiemers P, RN Outcome: Progressing 09/01/2022 1507 by Randal Buba P, RN Outcome: Progressing   Problem: Metabolic: Goal: Ability to maintain appropriate glucose levels will improve 09/01/2022 1507 by Jaxen Samples P, RN Outcome: Progressing 09/01/2022 1507 by Randal Buba P, RN Outcome: Progressing   Problem: Nutritional: Goal: Maintenance of adequate nutrition will improve 09/01/2022 1507 by Maxamilian Amadon P, RN Outcome: Progressing 09/01/2022 1507 by Sharol Given, RN Outcome: Progressing Goal: Progress toward achieving an optimal weight will improve 09/01/2022 1507 by Orvis Stann P, RN Outcome: Progressing 09/01/2022 1507 by Randal Buba P, RN Outcome: Progressing   Problem: Skin Integrity: Goal: Risk for impaired skin integrity will decrease 09/01/2022 1507 by Shante Archambeault P, RN Outcome: Progressing 09/01/2022 1507 by Sharol Given, RN Outcome: Progressing   Problem: Tissue Perfusion: Goal: Adequacy of tissue perfusion will improve 09/01/2022 1507 by Murvin Gift P, RN Outcome: Progressing 09/01/2022 1507 by Sharol Given, RN Outcome: Progressing   Problem: Education: Goal: Knowledge of General Education information will improve Description: Including pain rating scale, medication(s)/side effects and non-pharmacologic comfort measures 09/01/2022 1507 by Sharol Given, RN Outcome: Progressing 09/01/2022 1507 by Sharol Given, RN Outcome: Progressing   Problem: Health Behavior/Discharge Planning: Goal: Ability to manage health-related needs will improve 09/01/2022 1507 by Amalee Olsen P, RN Outcome:  Progressing 09/01/2022 1507 by Sharol Given, RN Outcome: Progressing   Problem: Clinical Measurements: Goal: Ability to maintain clinical measurements within normal limits will improve 09/01/2022 1507 by Jimena Wieczorek P, RN Outcome: Progressing 09/01/2022 1507 by Sharol Given, RN Outcome: Progressing Goal: Will remain free from infection 09/01/2022 1507  by Ardeen Jourdain, RN Outcome: Progressing 09/01/2022 1507 by Ardeen Jourdain, RN Outcome: Progressing Goal: Diagnostic test results will improve 09/01/2022 1507 by Ardeen Jourdain, RN Outcome: Progressing 09/01/2022 1507 by Ardeen Jourdain, RN Outcome: Progressing Goal: Respiratory complications will improve 09/01/2022 1507 by Ardeen Jourdain, RN Outcome: Progressing 09/01/2022 1507 by Ardeen Jourdain, RN Outcome: Progressing Goal: Cardiovascular complication will be avoided 09/01/2022 1507 by Alyssia Heese P, RN Outcome: Progressing 09/01/2022 1507 by Ardeen Jourdain, RN Outcome: Progressing   Problem: Activity: Goal: Risk for activity intolerance will decrease 09/01/2022 1507 by Nadav Swindell P, RN Outcome: Progressing 09/01/2022 1507 by Ardeen Jourdain, RN Outcome: Progressing   Problem: Nutrition: Goal: Adequate nutrition will be maintained 09/01/2022 1507 by Jennife Zaucha P, RN Outcome: Progressing 09/01/2022 1507 by Candace Cruise P, RN Outcome: Progressing   Problem: Coping: Goal: Level of anxiety will decrease 09/01/2022 1507 by Mehar Sagen P, RN Outcome: Progressing 09/01/2022 1507 by Candace Cruise P, RN Outcome: Progressing   Problem: Elimination: Goal: Will not experience complications related to bowel motility 09/01/2022 1507 by Ardeen Jourdain, RN Outcome: Progressing 09/01/2022 1507 by Ardeen Jourdain, RN Outcome: Progressing Goal: Will not experience complications related to urinary retention 09/01/2022 1507 by Polk Minor P, RN Outcome: Progressing 09/01/2022  1507 by Candace Cruise P, RN Outcome: Progressing   Problem: Pain Managment: Goal: General experience of comfort will improve 09/01/2022 1507 by Nuel Dejaynes P, RN Outcome: Progressing 09/01/2022 1507 by Ardeen Jourdain, RN Outcome: Progressing   Problem: Safety: Goal: Ability to remain free from injury will improve 09/01/2022 1507 by Shalayne Leach P, RN Outcome: Progressing 09/01/2022 1507 by Candace Cruise P, RN Outcome: Progressing   Problem: Skin Integrity: Goal: Risk for impaired skin integrity will decrease 09/01/2022 1507 by Jannessa Ogden P, RN Outcome: Progressing 09/01/2022 1507 by Ardeen Jourdain, RN Outcome: Progressing   Problem: Education: Goal: Understanding of CV disease, CV risk reduction, and recovery process will improve 09/01/2022 1507 by Carianna Lague P, RN Outcome: Progressing 09/01/2022 1507 by Ardeen Jourdain, RN Outcome: Progressing Goal: Individualized Educational Video(s) 09/01/2022 1507 by Ardeen Jourdain, RN Outcome: Progressing 09/01/2022 1507 by Ardeen Jourdain, RN Outcome: Progressing   Problem: Activity: Goal: Ability to return to baseline activity level will improve 09/01/2022 1507 by Shaliah Wann P, RN Outcome: Progressing 09/01/2022 1507 by Ardeen Jourdain, RN Outcome: Progressing   Problem: Cardiovascular: Goal: Ability to achieve and maintain adequate cardiovascular perfusion will improve 09/01/2022 1507 by Khamari Yousuf P, RN Outcome: Progressing 09/01/2022 1507 by Ardeen Jourdain, RN Outcome: Progressing Goal: Vascular access site(s) Level 0-1 will be maintained 09/01/2022 1507 by Caedyn Raygoza P, RN Outcome: Progressing 09/01/2022 1507 by Ardeen Jourdain, RN Outcome: Progressing   Problem: Health Behavior/Discharge Planning: Goal: Ability to safely manage health-related needs after discharge will improve 09/01/2022 1507 by Lechelle Wrigley P, RN Outcome: Progressing 09/01/2022 1507 by Ardeen Jourdain,  RN Outcome: Progressing

## 2022-09-01 NOTE — Progress Notes (Signed)
PROGRESS NOTE    Bradley [name]  NVB:[id number] DOB: 22-Oct-1940 DOA: 08/22/2022 PCP: System, Provider Not In  [id number]/254A-AA  LOS: 10 days   Brief hospital course: Bradley [name] is a 82 y.o. 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 Patient stated that he has chronic shortness of breath, which has been progressively worsening in the past several weeks, much worse day of arrival to ED, assoc w/ bilateral leg edema and legs oozing fluid. According to the patient he was sent from the [place] to our hospital with concerns for leg cellulitis. 12/28: pt was found to have BNP > 4500,  WBC 11.1, potassium 5.7, worsening renal function w/ Cr 2.84, VSS 121/54, oxygen saturation 100% on room air.  Chest x-ray showed low volume without infiltration.  Patient was admitted to telemetry bed as inpatient for tx CHF 12/29: Cardiology consulted  12/30: Debridement R foot wound w/ podiatry. Vascular surgery planning angiography pending improvementin renal function. 12/31: renal function about same Cr 1.99. Net IO Since Admission: -4,208.77 mL [08/25/22 1516] 01/01: renal fxn improving to Cr 1.78. Angiography planned for tomorrow  01/02: LE angiography w/ stent to R external iliac artery. Consideration for right femoral to above-knee popliteal artery bypass will be discussed w/ patient   Assessment & Plan:  Acute on chronic combined systolic and diastolic CHF (congestive heart failure) (HCC) 2D echo on 03/12/2022 showed EF of 40-45% with grade 2 diastolic dysfunction.  BNP> 4500. 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 --Podiatry --> saw  patient 12/30 and performed excisional debridement of the ulcerative areas on the right forefoot.  Plan: --cont dressing change per order  Right foot/ankle pain, resolved --pain appears to be located around achillis tender, not where ulcers are.  Both flexion and extension of right ankle causes pain, and pt unable to bear weight on it. --MRI right ankle no acute finding --pain suddenly resolved and can bear weight on 1/6.   PVD (peripheral vascular disease) ([place]) S/p LE angiography w/ stent to R external iliac artery on 08/28/22 --pt has improved perfusion after the stent placement. Plan: --cont Asa and statin --due to recent NSTEMI, too soon for right femoral to above-knee popliteal artery bypass, will follow up with Dr. Delana Meyer 3-4 weeks after discharge.   Acute renal failure superimposed on stage 3a chronic kidney disease ([place]):  Baseline creatinine 1.38 on 03/14/2022.   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  --chest pain morning of 1/3 with increasing trop from 72 to 2572, trending down now. --s/p heparin gtt Plan: --cont ASA and statin   Moderate aortic stenosis Recent echo done showed moderate aortic stenosis at [facility] TAVR is not indicated at this time and cardiology is following   HLD (hyperlipidemia) Lipid Panel done and showed a total cholesterol/HDL ratio of 2.3, cholesterol level 175, HDL 33, LDL 17, triglycerides 125,  VLDL 25 Statin Therapy with Atorvastatin    Type II diabetes mellitus with renal manifestations [facility]):  Recent A1c 7.8.   Poorly controlled.   Patient taking glipizide and Jardiance at home which are  held --SSI --mealtime 3u TID   Paroxysmal atrial fibrillation (HCC) s/p Cardioversion Amiodarone 100 mg po daily and Metoprolol Succinate 50 mg po Daily --cont Eliquis   Hyperkalemia:  S/p Loklema --monitor   Normocytic Anemia --No overt bleeding noted monitor CBC    DVT prophylaxis: [id number] Code Status: DNR  Family Communication:  Level of care: Progressive Dispo:   The patient is from: home Anticipated d/c is to: home Anticipated d/c date is: 1-2 days   Subjective and Interval History:  Pt reported right foot started hurting again today.  Reported dyspnea improved.   Objective: Vitals:   09/01/22 0500 09/01/22 0535 09/01/22 0757 09/01/22 1158  BP:  (!) 127/58 (!) 114/51 (!) 98/55  Pulse:  (!) 58 (!) 59 (!) 52  Resp:   18 18  Temp:  98.9 F (37.2 C) 98.3 F (36.8 C) 98.1 F (36.7 C)  TempSrc:  Oral  Oral  SpO2:  96% 96% 96%  Weight: 83.7 kg     Height:        Intake/Output Summary (Last 24 hours) at 09/01/2022 1749 Last data filed at 09/01/2022 1648 Gross per 24 hour  Intake 120 ml  Output 700 ml  Net -580 ml   Filed Weights   08/28/22 1055 08/31/22 0500 09/01/22 0500  Weight: 87.2 kg 87.1 kg 83.7 kg    Examination:   Constitutional: NAD, AAOx3 HEENT: conjunctivae and lids normal, EOMI CV: No cyanosis.   RESP: normal respiratory effort, on RA Extremities: ACE wrap over both LE's. SKIN: warm, dry Neuro: II - XII grossly intact.   Psych: Normal mood and affect.  Appropriate judgement and reason   Data Reviewed: I have personally reviewed labs and imaging studies  Time spent: 35 minutes  Darlin Priestly, MD [facility] If 7PM-7AM, please contact night-coverage 09/01/2022, 5:49 PM

## 2022-09-01 NOTE — Progress Notes (Signed)
Mobility Specialist - Progress Note   09/01/22 0929  Mobility  Activity Ambulated with assistance in room  Level of Assistance Standby assist, set-up cues, supervision of patient - no hands on  Assistive Device Front wheel walker  Distance Ambulated (ft) 10 ft  Activity Response Tolerated well  Mobility Referral Yes  $Mobility charge 1 Mobility   Pt OOB in bathroom upon arrival. Pt STS and ambulates to bed Supervision. Pt does endorse chest tightness at end of session. RN notified. Pt left EOB with needs in reach and bed alarm on.   Gretchen Short  Mobility Specialist  09/01/22 9:30 AM

## 2022-09-01 NOTE — TOC Progression Note (Signed)
Transition of Care [facility]) - Progression Note    Patient Details  Name: Bradley [name] MRN: [id number] Date of Birth: 1941/08/18  Transition of Care [facility]) CM/SW Contact  Izola Price, RN Phone Number: 09/01/2022, 4:28 PM  Clinical Narrative:  1/7: [name] at [facility] has accepted for [facility] when ready for discharge and orders placed. Was active with [facility] in July. Pending [facility] authorization.  Patient will go home to nieces house at discharge.  Simmie Davies RN CM     Expected Discharge Plan: [place] Barriers to Discharge: Continued Medical Work up  Expected Discharge Plan and Services   Discharge Planning Services: CM Consult   Living arrangements for the past 2 months: Single Family Home                                       Social Determinants of Health (SDOH) Interventions [place]: No Food Insecurity (08/23/2022)  Housing: Low Risk  (08/23/2022)  Transportation Needs: No Transportation Needs (08/23/2022)  Utilities: Not At Risk (08/23/2022)  Tobacco Use: Medium Risk (08/27/2022)    Readmission Risk Interventions     No data to display

## 2022-09-01 NOTE — Progress Notes (Addendum)
Cardiology Progress Note   Patient Name: Bradley [name] Date of Encounter: 09/01/2022  Primary Cardiologist: Julien Nordmann, MD  Subjective   Feels a little more short of breath this morning.  Has been wheezing-says he can hear it.  Albuterol nebulizer ordered as needed, but he has not received.  Discussed with nurse this morning.  No chest pain.  Right heel pain has returned little more mild.  Inpatient Medications    Scheduled Meds:  amiodarone  100 mg Oral Daily   apixaban  2.5 mg Oral BID   aspirin EC  81 mg Oral Daily   atorvastatin  80 mg Oral QHS   brinzolamide  1 drop Both Eyes TID   And   brimonidine  1 drop Both Eyes TID   Chlorhexidine Gluconate Cloth  6 each Topical Daily   collagenase   Topical Daily   cyanocobalamin  1,000 mcg Oral Daily   furosemide  80 mg Intravenous BID   insulin aspart  0-5 Units Subcutaneous QHS   insulin aspart  0-9 Units Subcutaneous TID WC   insulin aspart  3 Units Subcutaneous TID WC   isosorbide mononitrate  30 mg Oral Daily   metoprolol succinate  50 mg Oral Daily   pantoprazole  40 mg Oral Daily   polyethylene glycol  17 g Oral BID   sacubitril-valsartan  1 tablet Oral BID   timolol  1 drop Both Eyes QHS   Travoprost (BAK Free)  1 drop Both Eyes QHS   Continuous Infusions:  sodium chloride Stopped (08/24/22 1617)   sodium chloride     PRN Meds: sodium chloride, sodium chloride, acetaminophen, albuterol, dextromethorphan-guaiFENesin, HYDROcodone-acetaminophen, morphine injection, nitroGLYCERIN, ondansetron (ZOFRAN) IV, oxyCODONE, sodium chloride flush   Vital Signs    Vitals:   08/31/22 2015 09/01/22 0028 09/01/22 0500 09/01/22 0535  BP: 109/60 (!) 110/50  (!) 127/58  Pulse: (!) 55 (!) 54  (!) 58  Resp:  14    Temp: 98.3 F (36.8 C) 98 F (36.7 C)  98.9 F (37.2 C)  TempSrc: Oral Oral  Oral  SpO2: 96% 98%  96%  Weight:   83.7 kg   Height:        Intake/Output Summary (Last 24 hours) at 09/01/2022 0728 Last data filed  at 09/01/2022 0028 Gross per 24 hour  Intake 360 ml  Output 1850 ml  Net -1490 ml   Filed Weights   08/28/22 1055 08/31/22 0500 09/01/22 0500  Weight: 87.2 kg 87.1 kg 83.7 kg    Physical Exam   GEN: Well nourished, well developed, in no acute distress.  HEENT: Grossly normal.  Neck: Supple, JVD to jaw.  No bruits or masses.   Cardiac: RRR, 2/6 systolic ejection murmur throughout, no rubs or gallops. No clubbing, cyanosis, trace bilateral lower extremity edema.  Lower legs wrapped. Respiratory:  Respirations regular and unlabored, clear to auscultation on inspiration however, expiratory wheezing noted. GI: Soft, nontender, nondistended, BS + x 4. MS: no deformity or atrophy. Skin: warm and dry, no rash. Neuro:  Strength and sensation are intact. Psych: AAOx3.  Normal affect.  Labs    Chemistry Recent Labs  Lab 08/30/22 0730 08/31/22 0529 09/01/22 0621  NA 136 137 137  K 3.8 3.9 4.2  CL 101 100 98  CO2 27 27 29   GLUCOSE 153* 178* 135*  BUN 51* 51* 51*  CREATININE 1.68* 1.69* 1.83*  CALCIUM 8.6* 8.8* 8.8*  GFRNONAA 41* 40* 37*  ANIONGAP 8 10 10  Hematology Recent Labs  Lab 08/30/22 0730 08/31/22 0529 09/01/22 0621  WBC 9.2 8.1 8.0  RBC 3.39* 3.28* 3.35*  HGB 9.7* 9.6* 9.7*  HCT 32.5* 31.6* 32.4*  MCV 95.9 96.3 96.7  MCH 28.6 29.3 29.0  MCHC 29.8* 30.4 29.9*  RDW 21.5* 21.8* 21.6*  PLT 187 178 187    Cardiac Enzymes  Recent Labs  Lab 08/28/22 0357 08/28/22 0758 08/28/22 1426 08/28/22 2138 08/29/22 0518  TROPONINIHS 26* 72* 2,572* 2,172* 1,216*      BNP    Component Value Date/Time   BNP >4,500.0 (H) 08/22/2022 1145   Lipids  Lab Results  Component Value Date   CHOL 75 08/22/2022   HDL 33 (L) 08/22/2022   LDLCALC 17 08/22/2022   TRIG 125 08/22/2022   CHOLHDL 2.3 08/22/2022    HbA1c  Lab Results  Component Value Date   HGBA1C 8.9 (H) 08/23/2022    Radiology    MR ANKLE RIGHT W WO CONTRAST  Result Date: 08/31/2022 CLINICAL  DATA:  Achilles tendinitis.  Pain over Achilles tendon. EXAM: MRI OF THE RIGHT ANKLE WITHOUT AND WITH CONTRAST TECHNIQUE: Multiplanar, multisequence MR imaging of the ankle was performed before and after the administration of intravenous contrast. CONTRAST:  33mL GADAVIST GADOBUTROL 1 MMOL/ML IV SOLN COMPARISON:  None Available. FINDINGS: TENDONS Peroneal: Intact and normally positioned. Posteromedial: Intact and normally positioned. Anterior: Intact and normally positioned. Achilles: Intact. Plantar Fascia: Minimal thickening of the central cord of the plantar fascia of with mild adjacent soft tissue enhancement and a small plantar calcaneal spur. No evidence of fascial rupture or marrow edema. LIGAMENTS Lateral: The anterior and posterior talofibular and calcaneofibular ligaments are intact.The inferior tibiofibular ligaments appear intact. Medial: The deltoid and visualized portions of the spring ligament appear intact. CARTILAGE AND BONES Ankle Joint: Cystic osteochondral lesion of the talar dome medially appears degenerative. No unstable osteochondral fragment identified. No significant ankle joint effusion or abnormal synovial enhancement. Subtalar Joints/Sinus Tarsi: Unremarkable. Bones: Minimal midfoot degenerative changes. No acute or significant extra-articular osseous findings. Other: 5 mm ganglion between the medial and middle cuneiform bones, likely incidental. Mild subcutaneous edema about the ankle without focal fluid collection or suspicious soft tissue enhancement. IMPRESSION: IMPRESSION 1. The Achilles tendon appears normal. 2. Possible mild plantar fasciitis. 3. Degenerative osteochondral lesion of the talar dome medially. No acute osseous findings. 4. The ankle tendons and ligaments appear intact. Electronically Signed   By: Carey Bullocks M.D.   On: 08/31/2022 09:54   Telemetry    Regular sinus rhythm with rare PVCs/couplets- Personally Reviewed  Cardiac Studies   Cardiac Catheterization   11.2022   Diagnostic Dominance: Right  *Med Rx _____________    Cardiac Catheterization  12.10.2023 ([facility])   1. Known native 3VD (RCA not injected, known CTO)  2. Patent LIMA-LAD  3. Patent free RIMA-OM3  4. SVG-RPL with distal 99% lesion on hairpin turn adjacent to distal anastomosis (unchanged compared with Jan 2023 cath)   Recommendations:   1. D/C left radial TR band per protocol  2. Unchanged coronary anatomy compared with Jan 2023 cath.  The SVG-RPL lesion was attempted previously unsuccessfully.  Given new symptoms, unlikely that this is a culprit for current CP.  3. Continue work-up/management for LFLG aortic stenosis.  _____________    2D Echocardiogram 1.3.2024    1. Left ventricular ejection fraction, by estimation, is 40 to 45%. The  left ventricle has mildly decreased function. The left ventricle  demonstrates global hypokinesis with severe hypokinesis of  the bassal  anterior and anteroseptal wall, and inferior  wall. Left ventricular diastolic parameters are consistent with Grade II  diastolic dysfunction (pseudonormalization). There is the interventricular  septum is flattened in systole and diastole, consistent with right  ventricular pressure and volume  overload.   2. Right ventricular systolic function is normal. The right ventricular  size is normal. There is moderately elevated pulmonary artery systolic  pressure. The estimated right ventricular systolic pressure is 40.8 mmHg.   3. Left atrial size was mildly dilated.   4. Right atrial size was mildly dilated.   5. The mitral valve is normal in structure. Mild to moderate mitral valve  regurgitation. No evidence of mitral stenosis.   6. Tricuspid valve regurgitation is mild to moderate.   7. The aortic valve is tricuspid. There is moderate calcification of the  aortic valve. Aortic valve regurgitation is mild to moderate. Mild aortic  valve stenosis by Aortic valve mean gradient measures 14.3 mmHg.  Aortic  valve Vmax measures 2.54  m/s.Moderate stenosis by Aortic valve area, by VTI measures 0.89 cm.   8. The inferior vena cava is normal in size with greater than 50%  respiratory variability, suggesting right atrial pressure of 3 mmHg.  _____________   Patient Profile     82 y.o. 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    Assessment & Plan    1.  CAD/NSTEMI:  H/o CAD and CABG x 3 w/ last cath in 07/2022 w/ severe native multivessel CAD w/ patent LIMA  LAD, patent RIMA  OM3, and 99% distal lesion in VG  RPL w/ native RPL dzs  Med rx advised.  Presented 12/28 w/ lower ext edema and cellulitis.  Initially w/ mild HsTrop elevation followed by c/p on the morning of 1/3 and further rise in HsTrop to 2572.  Cath report reviewed by interventional cardiology w/ rec for ongoing med rx.  No chest pain.  Breathing slightly worse this morning with increasing wheezing.  Cont asa, statin, ? blocker, and long-acting nitrate.  2.  Acute on chronic HFmrEF: EF 40-45% by echo this admission. Has responded well to IV diuresis.  Minus 1.49L overnight and minus 10L since admission.  Wt recorded as down 3.4 kg since yesterday.  Creat bumping some @ 1.83 this AM w/ stable BUN of 51 and bicarb of 29.  Breathing slightly worse this morning in the setting of expiratory wheezing.  Still with evidence of volume overload on exam.  Continue IV diuresis for 1 more day and suspect we can transition to oral loop diuretic tomorrow.  BP stable following resumption of entresto 1/6.  Cont ? blocker.  Look to resume spiro, perhaps tomorrow.  With creat clearance hovering around 30, may not be best SGLT2i candidate (was on @ home  prev).  3.  PAD/Foot ulcers:   S/p R iliac PTA and stenting this admission.  Residual SFA dzs w/ plan for outpt f/u and consideration for repeat procedure in the future.   Right heel pain worse again this morning.  MRI in January 6 without acute findings.  4.  PAF:  Maintaining sinus rhythm on amio and metoprolol.   Back on eliquis. Cont amio/? blocker.  5.  Moderate Ao Stenosis:  Recent eval @ [facility]  w/ dobutamine echo  Follow as outpt.   6.  Acute on chronic stage III kidney disease:  As above, creat bumping slightly.  Still with evidence of volume overload.  IV diuresis for at least 1 more day.  Follow-up creatinine in the morning.  7.  DMII:  A1c 8.9. Insulin mgmt per IM.   Signed, Nicolasa Ducking, NP  09/01/2022, 7:28 AM    For questions or updates, please contact   Please consult [email or web address] for contact info under Cardiology/STEMI.

## 2022-09-01 NOTE — Progress Notes (Signed)
Mobility Specialist - Progress Note   09/01/22 0918  Mobility  Activity Ambulated with assistance in room;Ambulated with assistance to bathroom;Stood at bedside;Dangled on edge of bed  Level of Assistance Standby assist, set-up cues, supervision of patient - no hands on  Assistive Device Front wheel walker  Distance Ambulated (ft) 10 ft  Activity Response Tolerated well  Mobility Referral Yes  $Mobility charge 1 Mobility   Pt semi-supine in bed on RA upon arrival. Pt STS and ambulates to bathroom Supervision. Pt left in bathroom with education on how to call for assistance.   Gretchen Short  Mobility Specialist  09/01/22 9:19 AM

## 2022-09-02 ENCOUNTER — Inpatient Hospital Stay: Payer: No Typology Code available for payment source

## 2022-09-02 DIAGNOSIS — I5043 Acute on chronic combined systolic (congestive) and diastolic (congestive) heart failure: Secondary | ICD-10-CM | POA: Diagnosis not present

## 2022-09-02 LAB — CBC
HCT: 33.2 % — ABNORMAL LOW (ref 39.0–52.0)
Hemoglobin: 10.1 g/dL — ABNORMAL LOW (ref 13.0–17.0)
MCH: 29.1 pg (ref 26.0–34.0)
MCHC: 30.4 g/dL (ref 30.0–36.0)
MCV: 95.7 fL (ref 80.0–100.0)
Platelets: 206 10*3/uL (ref 150–400)
RBC: 3.47 MIL/uL — ABNORMAL LOW (ref 4.22–5.81)
RDW: 21.7 % — ABNORMAL HIGH (ref 11.5–15.5)
WBC: 7.8 10*3/uL (ref 4.0–10.5)
nRBC: 0 % (ref 0.0–0.2)

## 2022-09-02 LAB — BASIC METABOLIC PANEL
Anion gap: 10 (ref 5–15)
BUN: 55 mg/dL — ABNORMAL HIGH (ref 8–23)
CO2: 29 mmol/L (ref 22–32)
Calcium: 8.6 mg/dL — ABNORMAL LOW (ref 8.9–10.3)
Chloride: 96 mmol/L — ABNORMAL LOW (ref 98–111)
Creatinine, Ser: 1.92 mg/dL — ABNORMAL HIGH (ref 0.61–1.24)
GFR, Estimated: 35 mL/min — ABNORMAL LOW (ref >60)
Glucose, Bld: 166 mg/dL — ABNORMAL HIGH (ref 70–99)
Potassium: 4.2 mmol/L (ref 3.5–5.1)
Sodium: 135 mmol/L (ref 135–145)

## 2022-09-02 LAB — GLUCOSE, CAPILLARY
Glucose-Capillary: 138 mg/dL — ABNORMAL HIGH (ref 70–99)
Glucose-Capillary: 124 mg/dL — ABNORMAL HIGH (ref 70–99)
Glucose-Capillary: 207 mg/dL — ABNORMAL HIGH (ref 70–99)
Glucose-Capillary: 176 mg/dL — ABNORMAL HIGH (ref 70–99)

## 2022-09-02 LAB — RESP PANEL BY RT-PCR (RSV, FLU A&B, COVID)  RVPGX2
Influenza A by PCR: NEGATIVE
Influenza B by PCR: NEGATIVE
Resp Syncytial Virus by PCR: POSITIVE — AB
SARS Coronavirus 2 by RT PCR: NEGATIVE

## 2022-09-02 LAB — MAGNESIUM: Magnesium: 2.1 mg/dL (ref 1.7–2.4)

## 2022-09-02 MED ORDER — IPRATROPIUM-ALBUTEROL 0.5-2.5 (3) MG/3ML IN SOLN
3.0000 mL | Freq: Four times a day (QID) | RESPIRATORY_TRACT | Status: DC
  Administered 2022-09-02: 3 mL via RESPIRATORY_TRACT
  Filled 2022-09-02: qty 3

## 2022-09-02 MED ORDER — IPRATROPIUM-ALBUTEROL 0.5-2.5 (3) MG/3ML IN SOLN
3.0000 mL | Freq: Four times a day (QID) | RESPIRATORY_TRACT | Status: DC
  Administered 2022-09-02 – 2022-09-03 (×2): 3 mL via RESPIRATORY_TRACT
  Filled 2022-09-02 (×2): qty 3

## 2022-09-02 MED ORDER — FUROSEMIDE 40 MG PO TABS: 60.0000 mg | ORAL_TABLET | Freq: Every day | ORAL | Status: DC

## 2022-09-02 NOTE — Progress Notes (Signed)
Progress Note  Patient Name: Bradley [name] Date of Encounter: 09/02/2022  Primary Cardiologist: Mariah Milling  Subjective   No chest pain. Dyspnea improving. Some nausea. BUN/SCr trending up. Documented UOP 860 mL over the past 24 hours, net - 12 L for the admission. Weight trend 83.7 to 83.3 kg over the past 24 hours.   Inpatient Medications    Scheduled Meds:  amiodarone  100 mg Oral Daily   apixaban  2.5 mg Oral BID   aspirin EC  81 mg Oral Daily   atorvastatin  80 mg Oral QHS   brinzolamide  1 drop Both Eyes TID   And   brimonidine  1 drop Both Eyes TID   Chlorhexidine Gluconate Cloth  6 each Topical Daily   collagenase   Topical Daily   cyanocobalamin  1,000 mcg Oral Daily   furosemide  80 mg Intravenous BID   insulin aspart  0-5 Units Subcutaneous QHS   insulin aspart  0-9 Units Subcutaneous TID WC   insulin aspart  3 Units Subcutaneous TID WC   isosorbide mononitrate  30 mg Oral Daily   metoprolol succinate  50 mg Oral Daily   pantoprazole  40 mg Oral Daily   polyethylene glycol  17 g Oral BID   sacubitril-valsartan  1 tablet Oral BID   timolol  1 drop Both Eyes QHS   Travoprost (BAK Free)  1 drop Both Eyes QHS   Continuous Infusions:  sodium chloride Stopped (08/24/22 1617)   sodium chloride     PRN Meds: sodium chloride, sodium chloride, acetaminophen, albuterol, dextromethorphan-guaiFENesin, HYDROcodone-acetaminophen, morphine injection, nitroGLYCERIN, ondansetron (ZOFRAN) IV, oxyCODONE, sodium chloride flush   Vital Signs    Vitals:   09/01/22 1801 09/01/22 2223 09/02/22 0309 09/02/22 0727  BP: (!) 94/47 (!) 115/56 (!) 112/57 122/77  Pulse:  (!) 57 (!) 57 63  Resp: 16 12 18 16   Temp: 98.3 F (36.8 C) 98.6 F (37 C) 98.6 F (37 C) 98.6 F (37 C)  TempSrc: Oral Oral Oral Oral  SpO2: 98% 99% 95% 95%  Weight:    83.3 kg  Height:        Intake/Output Summary (Last 24 hours) at 09/02/2022 0851 Last data filed at 09/02/2022 0733 Gross per 24 hour  Intake 240  ml  Output 1600 ml  Net -1360 ml   Filed Weights   08/31/22 0500 09/01/22 0500 09/02/22 0727  Weight: 87.1 kg 83.7 kg 83.3 kg    Telemetry    SR with artifact - Personally Reviewed  ECG    No new tracings - Personally Reviewed  Physical Exam   GEN: No acute distress.   Neck: No JVD. Cardiac: RRR, II/VI systolic murmur RUSB, no rubs, or gallops.  Respiratory: Mild expiratory wheezing bilaterally.  GI: Soft, nontender, non-distended.   MS: No edema; No deformity. Neuro:  Alert and oriented x 3; Nonfocal.  Psych: Normal affect.  Labs    Chemistry Recent Labs  Lab 08/31/22 0529 09/01/22 0621 09/02/22 0520  NA 137 137 135  K 3.9 4.2 4.2  CL 100 98 96*  CO2 27 29 29   GLUCOSE 178* 135* 166*  BUN 51* 51* 55*  CREATININE 1.69* 1.83* 1.92*  CALCIUM 8.8* 8.8* 8.6*  GFRNONAA 40* 37* 35*  ANIONGAP 10 10 10      Hematology Recent Labs  Lab 08/31/22 0529 09/01/22 0621 09/02/22 0520  WBC 8.1 8.0 7.8  RBC 3.28* 3.35* 3.47*  HGB 9.6* 9.7* 10.1*  HCT 31.6* 32.4* 33.2*  MCV 96.3 96.7 95.7  MCH 29.3 29.0 29.1  MCHC 30.4 29.9* 30.4  RDW 21.8* 21.6* 21.7*  PLT 178 187 206    Cardiac EnzymesNo results for input(s): "TROPONINI" in the last 168 hours. No results for input(s): "TROPIPOC" in the last 168 hours.   BNP Recent Labs  Lab 09/01/22 0616  BNP 3,682.6*     DDimer No results for input(s): "DDIMER" in the last 168 hours.   Radiology    DG Chest 2 View  Result Date: 09/01/2022 IMPRESSION: 1. Enlargement of the cardiopericardial silhouette with vascular congestion and interstitial edema. Pulmonary edema pattern has decreased in the interval. 2. Patchy airspace disease at the left base with small bilateral pleural effusions. Electronically Signed   By: [name] M.D.   On: 09/01/2022 11:45    Cardiac Studies   2D echo 08/28/2022: 1. Left ventricular ejection fraction, by estimation, is 40 to 45%. The  left ventricle has mildly decreased function. The left  ventricle  demonstrates global hypokinesis with severe hypokinesis of the bassal  anterior and anteroseptal wall, and inferior  wall. Left ventricular diastolic parameters are consistent with Grade II  diastolic dysfunction (pseudonormalization). There is the interventricular  septum is flattened in systole and diastole, consistent with right  ventricular pressure and volume  overload.   2. Right ventricular systolic function is normal. The right ventricular  size is normal. There is moderately elevated pulmonary artery systolic  pressure. The estimated right ventricular systolic pressure is 49.9 mmHg.   3. Left atrial size was mildly dilated.   4. Right atrial size was mildly dilated.   5. The mitral valve is normal in structure. Mild to moderate mitral valve  regurgitation. No evidence of mitral stenosis.   6. Tricuspid valve regurgitation is mild to moderate.   7. The aortic valve is tricuspid. There is moderate calcification of the  aortic valve. Aortic valve regurgitation is mild to moderate. Mild aortic  valve stenosis by Aortic valve mean gradient measures 14.3 mmHg. Aortic  valve Vmax measures 2.54  m/s.Moderate stenosis by Aortic valve area, by VTI measures 0.89 cm.   8. The inferior vena cava is normal in size with greater than 50%  respiratory variability, suggesting right atrial pressure of 3 mmHg.  __________  LHC 08/04/2022 ([facility]):  1. Known native 3VD (RCA not injected, known CTO)  2. Patent LIMA-LAD  3. Patent free RIMA-OM3  4. SVG-RPL with distal 99% lesion on hairpin turn adjacent to distal anastomosis (unchanged compared with Jan 2023 cath)   Recommendations:   1. D/C left radial TR band per protocol  2. Unchanged coronary anatomy compared with Jan 2023 cath.  The SVG-RPL lesion was attempted previously unsuccessfully.  Given new symptoms, unlikely that this is a culprit for current CP.  3. Continue work-up/management for LFLG aortic stenosis.   Patient  Profile     82 y.o. male with history of CAD s/p 3-vessel CABG in 2016, HFrEF, moderate aortic stenosis, persistent Afib, CKD stage III, DM2, HTN, HLD, anemia, and PAD s/p right external iliac intervention 08/27/2022 whom we are seeing for HFrEF.   Assessment & Plan    1. CAD s/p CABG with elevated troponin -No chest pain -Elevated troponin likely supply demand ischemia in the setting of known multivessel CAD with cellulitis, volume overload, anemia, and underlying CKD -LHC on 08/04/2022 at [facility] showed known native 3-vessel CAD with patent LIMA to LAD and RIMA to OM3 with 99% distal lesion of the SVG-RPL on hairpin  turn adjacent to the distal anastomosis and unchanged from [facility] in 08/2021 with recommendations for ongoing medical management  -ASA, Imdur, Liptior, Toprol   2. Acute on chronic HFmrEF: -Volume status improved -Continue Toprol XL and Entresto -Not on MRA or SGLT2i in the setting of CKD -Hold IV Lasix with worsening renal function   3. Persistent Afib: -Maintaining sinus rhythm on metoprolol and amiodarone -AST/ALT and TSH normal on last check -CHADS2VASc at least 6 (CHF, HTN, age x 2, DM, vascular disease) -Eliquis 2.5 mg bid, meets reduced dosing criteria with age and SCr  4. Moderate aortic stenosis: -Recent evaluation at [facility] with dobutamine echo -Follow up as outpatient  5. PAD: Status post right iliac PTA and stenting this admission -Residual SFA disease with plan for outpatient follow up and consideration for repeat procedure in the future -MRI in January 6 without acute findings -ASA and statin -Followed by vascular surgery  6. Acute on CKD stage III: -BUN/SCr slightly worse this morning -Hold IV Lasix for today  7. HTN: -Blood pressure well controlled -Continue therapy as outlined above       For questions or updates, please contact [place] Please consult [email or web address] for contact info under Cardiology/STEMI.    Signed, Christell Faith, PA-C [place] Pager: [phone] 09/02/2022, 8:51 AM

## 2022-09-02 NOTE — Progress Notes (Signed)
PROGRESS NOTE    Bradley [name]  ELF:[id number] DOB: [birth year] DOA: 08/22/2022 PCP: System, Provider Not In  254A/254A-AA  LOS: 11 days   Brief hospital course: Bradley [name] is a 82 y.o. 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 Patient stated that he has chronic shortness of breath, which has been progressively worsening in the past several weeks, much worse day of arrival to ED, assoc w/ bilateral leg edema and legs oozing fluid. According to the patient he was sent from the [place] to our hospital with concerns for leg cellulitis. 12/28: pt was found to have BNP > 4500,  WBC 11.1, potassium 5.7, worsening renal function w/ Cr 2.84, VSS 121/54, oxygen saturation 100% on room air.  Chest x-ray showed low volume without infiltration.  Patient was admitted to telemetry bed as inpatient for tx CHF 12/29: Cardiology consulted  12/30: Debridement R foot wound w/ podiatry. Vascular surgery planning angiography pending improvementin renal function. 12/31: renal function about same Cr 1.99. Net IO Since Admission: -4,208.77 mL [08/25/22 1516] 01/01: renal fxn improving to Cr 1.78. Angiography planned for tomorrow  01/02: LE angiography w/ stent to R external iliac artery. Consideration for right femoral to above-knee popliteal artery bypass will be discussed w/ patient   Assessment & Plan:  Acute on chronic combined systolic and diastolic CHF (congestive heart failure) (HCC) 2D echo on 03/12/2022 showed EF of 40-45% with grade 2 diastolic dysfunction.  BNP> 4500. 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 --Podiatry --> saw patient 12/30 and performed excisional debridement of the ulcerative areas on the right forefoot.  Plan: --cont dressing change per order  Right foot/ankle pain, intermittent --pain appears to be located around achillis tender, not where ulcers are.  Both flexion and extension of right ankle causes pain, and pt unable to bear weight on it. --MRI right ankle no acute finding --pain suddenly resolved and can bear weight on 1/6.   PVD (peripheral vascular disease) (HCC) S/p LE angiography w/ stent to R external iliac artery on 08/28/22 --pt has improved perfusion after the stent placement. Plan: --cont Asa and statin --due to recent NSTEMI, too soon for right femoral to above-knee popliteal artery bypass, will follow up with Bradley [name] 3-4 weeks after discharge.   Acute renal failure superimposed on stage 3a chronic kidney disease (HCC):  Baseline creatinine 1.38 on 03/14/2022.   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  --chest pain morning of 1/3 with increasing trop from 72 to 2572, trending down now. --s/p heparin gtt Plan: --cont ASA and statin   Moderate aortic stenosis Recent echo done showed moderate aortic stenosis at [facility] TAVR is not indicated at this time and cardiology is following   HLD (hyperlipidemia) Lipid Panel done and showed a total cholesterol/HDL ratio of 2.3, cholesterol  level 175, HDL 33, LDL 17, triglycerides 125, VLDL 25 --cont Lipitor   Type II diabetes mellitus with renal manifestations ([place]):  Recent A1c 7.8.   Poorly controlled.   Patient taking glipizide and Jardiance at home  which are held --SSI --mealtime 3u TID   Paroxysmal atrial fibrillation (HCC) s/p Cardioversion Amiodarone 100 mg po daily and Metoprolol Succinate 50 mg po Daily --cont Eliquis   Hyperkalemia:  S/p Loklema --monitor   Normocytic Anemia --No overt bleeding noted monitor CBC  RSV infection --developed cough and wheezing today --start DuoNeb QID    DVT prophylaxis: WU:[id number] Code Status: DNR  Family Communication:  Level of care: Progressive Dispo:   The patient is from: home Anticipated d/c is to: home Anticipated d/c date is: 1-2 days   Subjective and Interval History:  Pt developed new cough with sputum production.  Complained of right foot hurting again.  Pt tested RSV pos today.   Objective: Vitals:   09/02/22 0727 09/02/22 1119 09/02/22 1401 09/02/22 1547  BP: 122/77 (!) 100/49  (!) 108/55  Pulse: 63 (!) 57  61  Resp: 16 18  20   Temp: 98.6 F (37 C) 98.3 F (36.8 C)  98.6 F (37 C)  TempSrc: Oral Oral  Oral  SpO2: 95% 97% 97% 96%  Weight: 83.3 kg     Height:        Intake/Output Summary (Last 24 hours) at 09/02/2022 1808 Last data filed at 09/02/2022 0900 Gross per 24 hour  Intake 480 ml  Output 1600 ml  Net -1120 ml   Filed Weights   08/31/22 0500 09/01/22 0500 09/02/22 0727  Weight: 87.1 kg 83.7 kg 83.3 kg    Examination:   Constitutional: NAD, AAOx3 HEENT: conjunctivae and lids normal, EOMI, hoarse CV: No cyanosis.   RESP: normal respiratory effort, diffuse wheezing Extremities: both legs wrapped SKIN: warm, dry Neuro: II - XII grossly intact.   Psych: Normal mood and affect.  Appropriate judgement and reason   Data Reviewed: I have personally reviewed labs and imaging studies  Time spent: 35 minutes  Bradley Bi, MD [facility] If 7PM-7AM, please contact night-coverage 09/02/2022, 6:08 PM

## 2022-09-03 DIAGNOSIS — I5043 Acute on chronic combined systolic (congestive) and diastolic (congestive) heart failure: Secondary | ICD-10-CM | POA: Diagnosis not present

## 2022-09-03 LAB — GLUCOSE, CAPILLARY
Glucose-Capillary: 176 mg/dL — ABNORMAL HIGH (ref 70–99)
Glucose-Capillary: 140 mg/dL — ABNORMAL HIGH (ref 70–99)
Glucose-Capillary: 210 mg/dL — ABNORMAL HIGH (ref 70–99)
Glucose-Capillary: 180 mg/dL — ABNORMAL HIGH (ref 70–99)

## 2022-09-03 LAB — CBC
HCT: 31.3 % — ABNORMAL LOW (ref 39.0–52.0)
Hemoglobin: 9.4 g/dL — ABNORMAL LOW (ref 13.0–17.0)
MCH: 29.1 pg (ref 26.0–34.0)
MCHC: 30 g/dL (ref 30.0–36.0)
MCV: 96.9 fL (ref 80.0–100.0)
Platelets: 202 10*3/uL (ref 150–400)
RBC: 3.23 MIL/uL — ABNORMAL LOW (ref 4.22–5.81)
RDW: 22 % — ABNORMAL HIGH (ref 11.5–15.5)
WBC: 6.9 10*3/uL (ref 4.0–10.5)
nRBC: 0 % (ref 0.0–0.2)

## 2022-09-03 LAB — BASIC METABOLIC PANEL
Anion gap: 8 (ref 5–15)
BUN: 65 mg/dL — ABNORMAL HIGH (ref 8–23)
CO2: 30 mmol/L (ref 22–32)
Calcium: 8.5 mg/dL — ABNORMAL LOW (ref 8.9–10.3)
Chloride: 97 mmol/L — ABNORMAL LOW (ref 98–111)
Creatinine, Ser: 2.2 mg/dL — ABNORMAL HIGH (ref 0.61–1.24)
GFR, Estimated: 29 mL/min — ABNORMAL LOW (ref >60)
Glucose, Bld: 181 mg/dL — ABNORMAL HIGH (ref 70–99)
Potassium: 4 mmol/L (ref 3.5–5.1)
Sodium: 135 mmol/L (ref 135–145)

## 2022-09-03 LAB — MAGNESIUM: Magnesium: 2.3 mg/dL (ref 1.7–2.4)

## 2022-09-03 MED ORDER — OXYCODONE HCL 5 MG PO TABS
5.0000 mg | ORAL_TABLET | ORAL | Status: DC | PRN
  Administered 2022-09-03 – 2022-09-11 (×12): 5 mg via ORAL
  Filled 2022-09-03 (×12): qty 1

## 2022-09-03 MED ORDER — ACETAMINOPHEN 500 MG PO TABS: 1000.0000 mg | ORAL_TABLET | Freq: Three times a day (TID) | ORAL | Status: DC | PRN

## 2022-09-03 MED ORDER — IPRATROPIUM-ALBUTEROL 0.5-2.5 (3) MG/3ML IN SOLN
3.0000 mL | Freq: Three times a day (TID) | RESPIRATORY_TRACT | Status: DC
  Administered 2022-09-03 – 2022-09-05 (×6): 3 mL via RESPIRATORY_TRACT
  Filled 2022-09-03 (×6): qty 3

## 2022-09-03 NOTE — Progress Notes (Signed)
PT Cancellation Note  Patient Details Name: Jaedin Regina MRN: [id number] DOB: 1940/09/14   Cancelled Treatment:    Reason Eval/Treat Not Completed: Pain limiting ability to participate Delorise Royals tattempt pt asleep; 2nd attempt, pt EOB eating meal. Pt declines OOB at this time, has not been keeping up on pain meds. He reportedly has continued to work his legs and sit EOB with meals.) Pt educated on importance of being mobile OOB each day to convince caregivers he will not decline at DC. Pt encouraged to premedicate tomorrow in anticipation of the hours of the day he is most likely to be OOB and mobile.   2:00 PM, 09/03/22 [name], PT, DPT Physical Therapist - [facility]  [phone] ([place])    [place] C 09/03/2022, 1:59 PM

## 2022-09-03 NOTE — TOC Progression Note (Signed)
Transition of Care [facility]) - Progression Note    Patient Details  Name: [name] MRN: [id number] Date of Birth: 1941/03/29  Transition of Care [facility]) CM/SW [place], Patrick Phone Number: 09/03/2022, 2:11 PM  Clinical Narrative:     [name] at [place] has accepted for [facility] when ready for discharge. CSW has reached out to MD to request [facility] orders for [facility] authorization through [place].   Patient will go home to nieces house at discharge [name], [name] [id number]     Expected Discharge Plan: [place] Barriers to Discharge: Continued Medical Work up  Expected Discharge Plan and Services   Discharge Planning Services: CM Consult   Living arrangements for the past 2 months: [place] Determinants of Health (SDOH) Interventions SDOH Screenings   Food Insecurity: No Food Insecurity (08/23/2022)  Housing: Low Risk  (08/23/2022)  Transportation Needs: No Transportation Needs (08/23/2022)  Utilities: Not At Risk (08/23/2022)  Tobacco Use: Medium Risk (08/27/2022)    Readmission Risk Interventions     No data to display

## 2022-09-03 NOTE — Progress Notes (Signed)
Progress Note  Patient Name: Bradley [name] Date of Encounter: 09/03/2022  Primary Cardiologist: [name]  Subjective   No chest pain. Dyspnea improving.  Chest x-ray yesterday concerning for worsening CHF.  Tested positive for RSV.  BUN/SCr continue trending up.   Inpatient Medications    Scheduled Meds:  amiodarone  100 mg Oral Daily   apixaban  2.5 mg Oral BID   aspirin EC  81 mg Oral Daily   atorvastatin  80 mg Oral QHS   brinzolamide  1 drop Both Eyes TID   And   brimonidine  1 drop Both Eyes TID   Chlorhexidine Gluconate Cloth  6 each Topical Daily   collagenase   Topical Daily   cyanocobalamin  1,000 mcg Oral Daily   insulin aspart  0-5 Units Subcutaneous QHS   insulin aspart  0-9 Units Subcutaneous TID WC   insulin aspart  3 Units Subcutaneous TID WC   ipratropium-albuterol  3 mL Nebulization QID   isosorbide mononitrate  30 mg Oral Daily   metoprolol succinate  50 mg Oral Daily   pantoprazole  40 mg Oral Daily   polyethylene glycol  17 g Oral BID   sacubitril-valsartan  1 tablet Oral BID   timolol  1 drop Both Eyes QHS   Travoprost (BAK Free)  1 drop Both Eyes QHS   Continuous Infusions:  sodium chloride Stopped (08/24/22 1617)   sodium chloride     PRN Meds: sodium chloride, sodium chloride, acetaminophen, albuterol, dextromethorphan-guaiFENesin, nitroGLYCERIN, ondansetron (ZOFRAN) IV, oxyCODONE, sodium chloride flush   Vital Signs    Vitals:   09/03/22 0014 09/03/22 0018 09/03/22 0512 09/03/22 0834  BP: (!) 104/36 (!) 99/47 (!) 115/51 (!) 112/52  Pulse: (!) 57  (!) 57 (!) 58  Resp: 20  20 18   Temp: 98.6 F (37 C)  98.8 F (37.1 C) 98.6 F (37 C)  TempSrc: Oral  Oral Oral  SpO2: 96%  99% 97%  Weight:      Height:        Intake/Output Summary (Last 24 hours) at 09/03/2022 0842 Last data filed at 09/02/2022 1700 Gross per 24 hour  Intake 720 ml  Output --  Net 720 ml    Filed Weights   08/31/22 0500 09/01/22 0500 09/02/22 0727  Weight: 87.1 kg  83.7 kg 83.3 kg    Telemetry    SR, 60s bpm- Personally Reviewed  ECG    No new tracings - Personally Reviewed  Physical Exam   GEN: No acute distress.   Neck: No JVD. Cardiac: RRR, II/VI systolic murmur RUSB, no rubs, or gallops.  Respiratory: Expiratory wheezing bilaterally.  GI: Soft, nontender, non-distended.   MS: No edema; No deformity. Neuro:  Alert and oriented x 3; Nonfocal.  Psych: Normal affect.  Labs    Chemistry Recent Labs  Lab 09/01/22 0621 09/02/22 0520 09/03/22 0642  NA 137 135 135  K 4.2 4.2 4.0  CL 98 96* 97*  CO2 29 29 30   GLUCOSE 135* 166* 181*  BUN 51* 55* 65*  CREATININE 1.83* 1.92* 2.20*  CALCIUM 8.8* 8.6* 8.5*  GFRNONAA 37* 35* 29*  ANIONGAP 10 10 8       Hematology Recent Labs  Lab 09/01/22 0621 09/02/22 0520 09/03/22 0642  WBC 8.0 7.8 6.9  RBC 3.35* 3.47* 3.23*  HGB 9.7* 10.1* 9.4*  HCT 32.4* 33.2* 31.3*  MCV 96.7 95.7 96.9  MCH 29.0 29.1 29.1  MCHC 29.9* 30.4 30.0  RDW 21.6* 21.7* 22.0*  PLT 187 206 202     Cardiac EnzymesNo results for input(s): "TROPONINI" in the last 168 hours. No results for input(s): "TROPIPOC" in the last 168 hours.   BNP Recent Labs  Lab 09/01/22 0616  BNP 3,682.6*      DDimer No results for input(s): "DDIMER" in the last 168 hours.   Radiology    DG Chest 2 View  Result Date: 09/01/2022 IMPRESSION: 1. Enlargement of the cardiopericardial silhouette with vascular congestion and interstitial edema. Pulmonary edema pattern has decreased in the interval. 2. Patchy airspace disease at the left base with small bilateral pleural effusions. Electronically Signed   By: [name] M.D.   On: 09/01/2022 11:45    Cardiac Studies   2D echo 08/28/2022: 1. Left ventricular ejection fraction, by estimation, is 40 to 45%. The  left ventricle has mildly decreased function. The left ventricle  demonstrates global hypokinesis with severe hypokinesis of the bassal  anterior and anteroseptal wall, and  inferior  wall. Left ventricular diastolic parameters are consistent with Grade II  diastolic dysfunction (pseudonormalization). There is the interventricular  septum is flattened in systole and diastole, consistent with right  ventricular pressure and volume  overload.   2. Right ventricular systolic function is normal. The right ventricular  size is normal. There is moderately elevated pulmonary artery systolic  pressure. The estimated right ventricular systolic pressure is 49.9 mmHg.   3. Left atrial size was mildly dilated.   4. Right atrial size was mildly dilated.   5. The mitral valve is normal in structure. Mild to moderate mitral valve  regurgitation. No evidence of mitral stenosis.   6. Tricuspid valve regurgitation is mild to moderate.   7. The aortic valve is tricuspid. There is moderate calcification of the  aortic valve. Aortic valve regurgitation is mild to moderate. Mild aortic  valve stenosis by Aortic valve mean gradient measures 14.3 mmHg. Aortic  valve Vmax measures 2.54  m/s.Moderate stenosis by Aortic valve area, by VTI measures 0.89 cm.   8. The inferior vena cava is normal in size with greater than 50%  respiratory variability, suggesting right atrial pressure of 3 mmHg.  __________  LHC 08/04/2022 ([facility]):  1. Known native 3VD (RCA not injected, known CTO)  2. Patent LIMA-LAD  3. Patent free RIMA-OM3  4. SVG-RPL with distal 99% lesion on hairpin turn adjacent to distal anastomosis (unchanged compared with Jan 2023 cath)   Recommendations:   1. D/C left radial TR band per protocol  2. Unchanged coronary anatomy compared with Jan 2023 cath.  The SVG-RPL lesion was attempted previously unsuccessfully.  Given new symptoms, unlikely that this is a culprit for current CP.  3. Continue work-up/management for LFLG aortic stenosis.   Patient Profile     82 y.o. male with history of CAD s/p 3-vessel CABG in 2016, HFrEF, moderate aortic stenosis, persistent Afib,  CKD stage III, DM2, HTN, HLD, anemia, and PAD s/p right external iliac intervention 08/27/2022 whom we are seeing for HFrEF.   Assessment & Plan    1. CAD s/p CABG with elevated troponin -No chest pain -Elevated troponin likely supply demand ischemia in the setting of known multivessel CAD with cellulitis, volume overload, anemia, and underlying CKD -LHC on 08/04/2022 at [facility] showed known native 3-vessel CAD with patent LIMA to LAD and RIMA to OM3 with 99% distal lesion of the SVG-RPL on hairpin turn adjacent to the distal anastomosis and unchanged from [facility] in 08/2021 with recommendations for ongoing medical management  -  ASA, Imdur, Liptior, Toprol   2. Acute on chronic HFmrEF: -Volume status improved -Suspect his chest x-ray findings are more RSV related as he does not appear volume overloaded -Continue Toprol XL and Entresto -Not on MRA or SGLT2i in the setting of CKD -Continue to hold Lasix, reassess tomorrow -If, following improvement from pulmonary infection, and if he remains dyspneic with worsening renal function, we may consider RHC, though would ideally like to defer this at this time  3. Persistent Afib: -Maintaining sinus rhythm on metoprolol and amiodarone -AST/ALT and TSH normal on last check -CHADS2VASc at least 6 (CHF, HTN, age x 2, DM, vascular disease) -Eliquis 2.5 mg bid, meets reduced dosing criteria with age and SCr  4. Moderate aortic stenosis: -Recent evaluation at [facility] with dobutamine echo -Follow up as outpatient  5. PAD: Status post right iliac PTA and stenting this admission -Residual SFA disease with plan for outpatient follow up and consideration for repeat procedure in the future -MRI in January 6 without acute findings -ASA and statin -Followed by vascular surgery  6. Acute on CKD stage III: -BUN/SCr slightly worse this morning -Cannot exclude contrast-induced nephropathy -Continue to hold Lasix for today  7. HTN: -Blood pressure well  controlled -Continue therapy as outlined above  8.  RSV: -Management per primary service      For questions or updates, please contact [facility] Please consult [email or web address] for contact info under Cardiology/STEMI.    Signed, Eula Listen, PA-C [facility] Pager: [phone] 09/03/2022, 8:42 AM

## 2022-09-03 NOTE — Plan of Care (Signed)
°  Problem: Education: °Goal: Ability to demonstrate management of disease process will improve °Outcome: Progressing °Goal: Ability to verbalize understanding of medication therapies will improve °Outcome: Progressing °Goal: Individualized Educational Video(s) °Outcome: Progressing °  °

## 2022-09-03 NOTE — Progress Notes (Signed)
PROGRESS NOTE    Bradley [name]  ZOX:[id number] DOB: [birth year] DOA: 08/22/2022 PCP: System, Provider Not In  254A/254A-AA  LOS: 12 days   Brief hospital course:   Assessment & Plan: Bradley [name] is a 82 y.o. 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   Acute on chronic combined systolic and diastolic CHF (congestive heart failure) ([place]) 2D echo on 03/12/2022 showed EF of 40-45% with grade 2 diastolic dysfunction.  BNP> 4500. 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 --Podiatry --> saw patient 12/30 and performed excisional debridement of the ulcerative areas on the right forefoot.  Plan: --cont dressing change per order  Right foot/ankle pain, intermittent --pain appears to be located around achillis tender, not where ulcers are.  Both flexion and extension of right ankle causes pain, and pt unable to bear weight on it. --MRI right ankle no acute finding --pain suddenly resolved and can bear weight on 1/6.  Now, pain is intermittent. --pt was ordered quite a bit of opioids for this, which is being tapered down.   PVD (peripheral vascular disease) ([place]) S/p LE angiography w/ stent to R external iliac artery on 08/28/22 --pt has improved perfusion after the stent placement. Plan: --cont Asa and statin --due to recent NSTEMI, too soon for right femoral to above-knee popliteal artery bypass, will follow up with Dr. Delana Meyer 3-4 weeks after discharge.   Acute renal failure superimposed on stage 3a chronic kidney disease ([place]):  Baseline creatinine 1.38 on  03/14/2022.   On Admission, Cr 2.84 --monitor Cr while diuresing   NSTEMI CAD (coronary artery disease status post CABG and prior PCI --underwent coronary angiography at [facility] less than a month ago, showing stable findings of severe native CAD, patent LIMA-LAD and SVG-OM, and patent SVG-R PLA with severe disease at the distal anastomosis that could could not be intervened upon previously at [facility].  --chest pain morning of 1/3 with increasing trop from 72 to 2572, trending down since. --s/p heparin gtt Plan: --cont ASA and statin   Moderate aortic stenosis Recent echo done showed moderate aortic stenosis at [facility] TAVR is not indicated at this time and cardiology is following   HLD (hyperlipidemia) Lipid Panel done and showed a total cholesterol/HDL ratio of 2.3, cholesterol level 175, HDL 33, LDL 17, triglycerides 125, VLDL 25 --cont Lipitor   Type II diabetes mellitus with renal manifestations ([place]):  Recent A1c 7.8.   Poorly controlled.   Patient taking glipizide and Jardiance at home which are held --SSI --mealtime 3u TID   Paroxysmal atrial fibrillation (HCC) s/p Cardioversion Amiodarone 100 mg po daily and Metoprolol Succinate 50 mg po Daily --cont Eliquis   Hyperkalemia:  S/p Loklema --monitor   Normocytic Anemia --No overt bleeding noted monitor CBC  RSV infection --developed cough and wheezing on 1/8, tested pos for RSV.  Not hypoxic.   --cont DuoNeb QID    DVT prophylaxis: WJ:[id number] Code Status: DNR  Family Communication:  Level of care: Med-Surg Dispo:   The patient is from: home Anticipated d/c is to: home Anticipated d/c date is: 1-2 days, if cleared by cardiology.  Subjective and Interval History:  Pt said no pain today.    Cr continues to trend up.  Diuretic held.   Objective: Vitals:   09/03/22 0834 09/03/22 1220 09/03/22 1429 09/03/22 1555  BP: (!) 112/52 (!) 94/43  (!) 113/54  Pulse: (!) 58 (!) 55  74  Resp: 18 16  20   Temp: 98.6 F (37  C) (!) 97.4 F (36.3 C)  98.1 F (36.7 C)  TempSrc: Oral     SpO2: 97% 95% 92% 97%  Weight:      Height:        Intake/Output Summary (Last 24 hours) at 09/03/2022 1754 Last data filed at 09/03/2022 1500 Gross per 24 hour  Intake --  Output 500 ml  Net -500 ml   Filed Weights   08/31/22 0500 09/01/22 0500 09/02/22 0727  Weight: 87.1 kg 83.7 kg 83.3 kg    Examination:   Constitutional: NAD, AAOx3 HEENT: conjunctivae and lids normal, EOMI CV: No cyanosis.   RESP: normal respiratory effort, mild wheezing, congested cough, on RA Extremities: both legs wrapped in ACE wrap SKIN: warm, dry Neuro: II - XII grossly intact.   Psych: Normal mood and affect.  Appropriate judgement and reason   Data Reviewed: I have personally reviewed labs and imaging studies  Time spent: 35 minutes  11/01/22, MD [facility] If 7PM-7AM, please contact night-coverage 09/03/2022, 5:54 PM

## 2022-09-04 DIAGNOSIS — I2510 Atherosclerotic heart disease of native coronary artery without angina pectoris: Secondary | ICD-10-CM | POA: Diagnosis not present

## 2022-09-04 DIAGNOSIS — I739 Peripheral vascular disease, unspecified: Secondary | ICD-10-CM | POA: Diagnosis not present

## 2022-09-04 DIAGNOSIS — I5043 Acute on chronic combined systolic (congestive) and diastolic (congestive) heart failure: Secondary | ICD-10-CM | POA: Diagnosis not present

## 2022-09-04 DIAGNOSIS — I4819 Other persistent atrial fibrillation: Secondary | ICD-10-CM | POA: Diagnosis not present

## 2022-09-04 DIAGNOSIS — L03115 Cellulitis of right lower limb: Secondary | ICD-10-CM | POA: Diagnosis not present

## 2022-09-04 DIAGNOSIS — I214 Non-ST elevation (NSTEMI) myocardial infarction: Secondary | ICD-10-CM | POA: Diagnosis not present

## 2022-09-04 LAB — BASIC METABOLIC PANEL
Anion gap: 10 (ref 5–15)
BUN: 74 mg/dL — ABNORMAL HIGH (ref 8–23)
CO2: 27 mmol/L (ref 22–32)
Calcium: 8.7 mg/dL — ABNORMAL LOW (ref 8.9–10.3)
Chloride: 97 mmol/L — ABNORMAL LOW (ref 98–111)
Creatinine, Ser: 2.17 mg/dL — ABNORMAL HIGH (ref 0.61–1.24)
GFR, Estimated: 30 mL/min — ABNORMAL LOW (ref >60)
Glucose, Bld: 175 mg/dL — ABNORMAL HIGH (ref 70–99)
Potassium: 4.5 mmol/L (ref 3.5–5.1)
Sodium: 134 mmol/L — ABNORMAL LOW (ref 135–145)

## 2022-09-04 LAB — CBC
HCT: 34.2 % — ABNORMAL LOW (ref 39.0–52.0)
Hemoglobin: 10.3 g/dL — ABNORMAL LOW (ref 13.0–17.0)
MCH: 29.1 pg (ref 26.0–34.0)
MCHC: 30.1 g/dL (ref 30.0–36.0)
MCV: 96.6 fL (ref 80.0–100.0)
Platelets: 243 10*3/uL (ref 150–400)
RBC: 3.54 MIL/uL — ABNORMAL LOW (ref 4.22–5.81)
RDW: 22.2 % — ABNORMAL HIGH (ref 11.5–15.5)
WBC: 8 10*3/uL (ref 4.0–10.5)
nRBC: 0 % (ref 0.0–0.2)

## 2022-09-04 LAB — GLUCOSE, CAPILLARY
Glucose-Capillary: 170 mg/dL — ABNORMAL HIGH (ref 70–99)
Glucose-Capillary: 218 mg/dL — ABNORMAL HIGH (ref 70–99)
Glucose-Capillary: 249 mg/dL — ABNORMAL HIGH (ref 70–99)
Glucose-Capillary: 137 mg/dL — ABNORMAL HIGH (ref 70–99)
Glucose-Capillary: 127 mg/dL — ABNORMAL HIGH (ref 70–99)

## 2022-09-04 LAB — MAGNESIUM: Magnesium: 2.5 mg/dL — ABNORMAL HIGH (ref 1.7–2.4)

## 2022-09-04 MED ORDER — ALBUMIN HUMAN 25 % IV SOLN
25.0000 g | Freq: Once | INTRAVENOUS | Status: AC
  Administered 2022-09-04: 25 g via INTRAVENOUS
  Filled 2022-09-04: qty 100

## 2022-09-04 NOTE — Progress Notes (Signed)
Progress Note  Patient Name: Bradley [name] Date of Encounter: 09/04/2022  Primary Cardiologist: [name]  Subjective   No chest pain. Dyspnea improving with nebulizer therapy.  Inpatient Medications    Scheduled Meds:  amiodarone  100 mg Oral Daily   apixaban  2.5 mg Oral BID   aspirin EC  81 mg Oral Daily   atorvastatin  80 mg Oral QHS   brinzolamide  1 drop Both Eyes TID   And   brimonidine  1 drop Both Eyes TID   Chlorhexidine Gluconate Cloth  6 each Topical Daily   collagenase   Topical Daily   cyanocobalamin  1,000 mcg Oral Daily   insulin aspart  0-5 Units Subcutaneous QHS   insulin aspart  0-9 Units Subcutaneous TID WC   insulin aspart  3 Units Subcutaneous TID WC   ipratropium-albuterol  3 mL Nebulization TID   isosorbide mononitrate  30 mg Oral Daily   metoprolol succinate  50 mg Oral Daily   pantoprazole  40 mg Oral Daily   polyethylene glycol  17 g Oral BID   sacubitril-valsartan  1 tablet Oral BID   timolol  1 drop Both Eyes QHS   Travoprost (BAK Free)  1 drop Both Eyes QHS   Continuous Infusions:  sodium chloride Stopped (08/24/22 1617)   sodium chloride     PRN Meds: sodium chloride, sodium chloride, acetaminophen, albuterol, dextromethorphan-guaiFENesin, nitroGLYCERIN, ondansetron (ZOFRAN) IV, oxyCODONE, sodium chloride flush   Vital Signs    Vitals:   09/03/22 2042 09/04/22 0023 09/04/22 0445 09/04/22 0737  BP: (!) 106/58 96/63 (!) 114/53 (!) 122/54  Pulse: 78 (!) 58 (!) 57 (!) 59  Resp: 12 20 15 20   Temp: 98.7 F (37.1 C) 97.7 F (36.5 C) 98.9 F (37.2 C) 98.5 F (36.9 C)  TempSrc: Oral Oral Oral Oral  SpO2: 95% 95% (!) 88% 96%  Weight:      Height:        Intake/Output Summary (Last 24 hours) at 09/04/2022 0745 Last data filed at 09/03/2022 1500 Gross per 24 hour  Intake --  Output 500 ml  Net -500 ml    Filed Weights   08/31/22 0500 09/01/22 0500 09/02/22 0727  Weight: 87.1 kg 83.7 kg 83.3 kg    Telemetry    SR, 60s bpm-  Personally Reviewed  ECG    No new tracings - Personally Reviewed  Physical Exam   GEN: No acute distress.   Neck: No JVD. Cardiac: RRR, II/VI systolic murmur RUSB, no rubs, or gallops.  Respiratory: Improving expiratory wheezing along the bilateral upper lungs.  GI: Soft, nontender, non-distended.   MS: No edema; No deformity. Neuro:  Alert and oriented x 3; Nonfocal.  Psych: Normal affect.  Labs    Chemistry Recent Labs  Lab 09/02/22 0520 09/03/22 0642 09/04/22 0628  NA 135 135 134*  K 4.2 4.0 4.5  CL 96* 97* 97*  CO2 29 30 27   GLUCOSE 166* 181* 175*  BUN 55* 65* 74*  CREATININE 1.92* 2.20* 2.17*  CALCIUM 8.6* 8.5* 8.7*  GFRNONAA 35* 29* 30*  ANIONGAP 10 8 10       Hematology Recent Labs  Lab 09/02/22 0520 09/03/22 0642 09/04/22 0628  WBC 7.8 6.9 8.0  RBC 3.47* 3.23* 3.54*  HGB 10.1* 9.4* 10.3*  HCT 33.2* 31.3* 34.2*  MCV 95.7 96.9 96.6  MCH 29.1 29.1 29.1  MCHC 30.4 30.0 30.1  RDW 21.7* 22.0* 22.2*  PLT 206 202 243  Cardiac EnzymesNo results for input(s): "TROPONINI" in the last 168 hours. No results for input(s): "TROPIPOC" in the last 168 hours.   BNP Recent Labs  Lab 09/01/22 0616  BNP 3,682.6*      DDimer No results for input(s): "DDIMER" in the last 168 hours.   Radiology    DG Chest 2 View  Result Date: 09/01/2022 IMPRESSION: 1. Enlargement of the cardiopericardial silhouette with vascular congestion and interstitial edema. Pulmonary edema pattern has decreased in the interval. 2. Patchy airspace disease at the left base with small bilateral pleural effusions. Electronically Signed   By: [name] M.D.   On: 09/01/2022 11:45    Cardiac Studies   2D echo 08/28/2022: 1. Left ventricular ejection fraction, by estimation, is 40 to 45%. The  left ventricle has mildly decreased function. The left ventricle  demonstrates global hypokinesis with severe hypokinesis of the bassal  anterior and anteroseptal wall, and inferior  wall.  Left ventricular diastolic parameters are consistent with Grade II  diastolic dysfunction (pseudonormalization). There is the interventricular  septum is flattened in systole and diastole, consistent with right  ventricular pressure and volume  overload.   2. Right ventricular systolic function is normal. The right ventricular  size is normal. There is moderately elevated pulmonary artery systolic  pressure. The estimated right ventricular systolic pressure is 49.9 mmHg.   3. Left atrial size was mildly dilated.   4. Right atrial size was mildly dilated.   5. The mitral valve is normal in structure. Mild to moderate mitral valve  regurgitation. No evidence of mitral stenosis.   6. Tricuspid valve regurgitation is mild to moderate.   7. The aortic valve is tricuspid. There is moderate calcification of the  aortic valve. Aortic valve regurgitation is mild to moderate. Mild aortic  valve stenosis by Aortic valve mean gradient measures 14.3 mmHg. Aortic  valve Vmax measures 2.54  m/s.Moderate stenosis by Aortic valve area, by VTI measures 0.89 cm.   8. The inferior vena cava is normal in size with greater than 50%  respiratory variability, suggesting right atrial pressure of 3 mmHg.  __________  LHC 08/04/2022 ([facility]):  1. Known native 3VD (RCA not injected, known CTO)  2. Patent LIMA-LAD  3. Patent free RIMA-OM3  4. SVG-RPL with distal 99% lesion on hairpin turn adjacent to distal anastomosis (unchanged compared with Jan 2023 cath)   Recommendations:   1. D/C left radial TR band per protocol  2. Unchanged coronary anatomy compared with Jan 2023 cath.  The SVG-RPL lesion was attempted previously unsuccessfully.  Given new symptoms, unlikely that this is a culprit for current CP.  3. Continue work-up/management for LFLG aortic stenosis.   Patient Profile     82 y.o. male with history of CAD s/p 3-vessel CABG in 2016, HFrEF, moderate aortic stenosis, persistent Afib, CKD stage III, DM2,  HTN, HLD, anemia, and PAD s/p right external iliac intervention 08/27/2022 whom we are seeing for HFrEF.   Assessment & Plan    1. CAD s/p CABG with elevated troponin -No chest pain -Elevated troponin likely supply demand ischemia in the setting of known multivessel CAD with cellulitis, volume overload, anemia, and underlying CKD -LHC on 08/04/2022 at [facility] showed known native 3-vessel CAD with patent LIMA to LAD and RIMA to OM3 with 99% distal lesion of the SVG-RPL on hairpin turn adjacent to the distal anastomosis and unchanged from [facility] in 08/2021 with recommendations for ongoing medical management  -ASA, Imdur, Liptior, Toprol   2.  Acute on chronic HFmrEF: -Volume status improved -Suspect his chest x-ray findings from 1/8 are more RSV related as he does not appear volume overloaded -Continue Toprol XL and Entresto -Not on MRA or SGLT2i in the setting of CKD -Continue to hold Lasix with AKI, reassess tomorrow -If, following improvement from pulmonary infection, he remains dyspneic with worsening renal function, we may consider RHC, though would ideally like to defer this at this time  3. Persistent Afib: -Maintaining sinus rhythm on metoprolol and amiodarone -AST/ALT and TSH normal on last check -CHADS2VASc at least 6 (CHF, HTN, age x 2, DM, vascular disease) -Eliquis 2.5 mg bid, meets reduced dosing criteria with age and SCr  4. Moderate aortic stenosis: -Recent evaluation at [facility] with dobutamine echo -Follow up as outpatient  5. PAD: -Status post right iliac PTA and stenting this admission -Residual SFA disease with plan for outpatient follow up and consideration for repeat procedure in the future -MRI in January 6 without acute findings -ASA and statin -Followed by vascular surgery  6. Acute on CKD stage III: -Cannot exclude contrast-induced nephropathy -Continue to hold Lasix for today  7. HTN: -Blood pressure well controlled -Continue therapy as outlined above  8.   RSV: -Management per primary service      For questions or updates, please contact [facility] Please consult [email or web address] for contact info under Cardiology/STEMI.    Signed, Christell Faith, PA-C [place] Pager: [phone] 09/04/2022, 7:45 AM

## 2022-09-04 NOTE — Progress Notes (Signed)
Progress Note   Patient: Bradley [name] YTK:[id number] DOB: 1940-11-15 DOA: 08/22/2022     13 DOS: the patient was seen and examined on 09/04/2022   Brief hospital course: Bradley [name] is a 82 y.o. 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 Patient stated that he has chronic shortness of breath, which has been progressively worsening in the past several weeks, much worse day of arrival to ED, assoc w/ bilateral leg edema and legs oozing fluid. According to the patient he was sent from the [place] to our hospital with concerns for leg cellulitis. 12/28: pt was found to have BNP > 4500,  WBC 11.1, potassium 5.7, worsening renal function w/ Cr 2.84, VSS 121/54, oxygen saturation 100% on room air.  Chest x-ray showed low volume without infiltration.  Patient was admitted to telemetry bed as inpatient for tx CHF 12/30: Debridement R foot wound w/ podiatry. Vascular surgery planning angiography pending improvementin renal function. 12/31: renal function about same Cr 1.99. Net IO Since Admission: -4,208.77 mL [08/25/22 1516] 01/01: renal fxn improving to Cr 1.78. Angiography planned for tomorrow  01/02: LE angiography w/ stent to R external iliac artery.         Assessment and Plan: Acute on chronic combined systolic and diastolic CHF (congestive heart failure) ([place]) 2D echo on 03/12/2022 showed EF of 40-45% with grade 2 diastolic dysfunction.  BNP> 4500. --has been diuresed with IV lasix 80 BID per cardio Diuretics on hold due to worsening renal function, currently still on Entresto and metoprolol. Currently, volume status has improved.  Acute renal failure superimposed on stage 3a chronic kidney disease ([place]):  Baseline creatinine 1.38 on 03/14/2022.   On Admission, Cr 2.84 Renal function is still higher than baseline, will  give a dose of albumin.   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 --Podiatry --> saw patient 12/30 and performed excisional debridement of the ulcerative areas on the right forefoot.  Plan: --cont dressing change per order   PVD (peripheral vascular disease) ([place]) S/p LE angiography w/ stent to R external iliac artery on 08/28/22 --pt has improved perfusion after the stent placement. Continue statin and aspirin.      NSTEMI CAD (coronary artery disease status post CABG and prior PCI --underwent coronary angiography at [facility] less than a month ago, showing stable findings of severe native CAD, patent LIMA-LAD and SVG-OM, and patent SVG-R PLA with severe disease at the distal anastomosis that could could not be intervened upon previously at [facility].  --chest pain morning of 1/3 with increasing trop from 72 to 2572, trending down since. Completed course of heparin drip, continue aspirin and statin.  Followed by cardiology.   Moderate aortic stenosis Recent echo done showed moderate aortic stenosis at [facility] TAVR is not indicated at this time and cardiology is following   HLD (hyperlipidemia) Continue Lipitor.   Type II diabetes mellitus with renal manifestations [facility]):  Recent A1c 7.8.   Poorly controlled.   Continue current insulin regimen, resume home regimen at discharge.   Paroxysmal atrial fibrillation (HCC) s/p Cardioversion Amiodarone 100 mg po daily and Metoprolol Succinate 50 mg po Daily --cont Eliquis   Hyperkalemia:  Improved.   Normocytic Anemia --No overt bleeding noted monitor CBC   RSV infection --developed  cough and wheezing on 1/8, tested pos for RSV.  Not hypoxic.   --cont DuoNeb QID        Subjective:  Patient doing much better, denies any shortness of breath.  Currently off oxygen.  Physical Exam: Vitals:   09/04/22 0737  09/04/22 0752 09/04/22 1121 09/04/22 1327  BP: (!) 122/54  (!) 99/52   Pulse: (!) 59  (!) 57   Resp: 20  18   Temp: 98.5 F (36.9 C)  97.6 F (36.4 C)   TempSrc: Oral  Oral   SpO2: 96% 99% 100% 100%  Weight:      Height:       General exam: Appears calm and comfortable  Respiratory system: A few crackles in the base.Marland Kitchen Respiratory effort normal. Cardiovascular system: S1 & S2 heard, RRR. No JVD, murmurs, rubs, gallops or clicks. No pedal edema. Gastrointestinal system: Abdomen is nondistended, soft and nontender. No organomegaly or masses felt. Normal bowel sounds heard. Central nervous system: Alert and oriented. No focal neurological deficits. Extremities: Symmetric 5 x 5 power. Skin: No rashes, lesions or ulcers Psychiatry: Judgement and insight appear normal. Mood & affect appropriate.   Data Reviewed:  Lab results reviewed.  Family Communication:   Disposition: Status is: Inpatient Remains inpatient appropriate because: Severity of disease IV infusion.  Planned Discharge Destination: Home with Home Health    Time spent: 35 minutes  Author: Sharen Hones, MD 09/04/2022 2:01 PM  For on call review [email or web address].

## 2022-09-04 NOTE — Progress Notes (Signed)
Progress Note    09/04/2022 10:30 AM 8 Days Post-Op  Subjective:  Bradley [name] is a 82 y.o. 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  Patient is now    Postprocedural day #7, is a 82 y.o. 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  :   Vital signs remained stable.  The right foot is actually fairly warm, patient acknowledges that this is much different than preprocedurally.   I discussed with him again today just as Dr Shelia Media had over the past weekend the fact that he has significant medical comorbidities that would favor a period of observation to see if he will improve without needing a secondary procedure to address the infrainguinal arterial occlusive disease.  He has good flow through the deep femoral by clinical state.   He is on aspirin and plavix and Eliquis now and transitioned obviously to outpatient anticoagulation.  No immediate plans for intervention on the SFA, again with a period of observation given his medical comorbidities.  He may be able to avoid another procedure. Both his lower extremities are UNA BOOT wrapped today. Media pictures show some improvement. No complications overnight and patients vitals all remain stable. We will continue to follow.   Vitals:   09/04/22 0737 09/04/22 0752  BP: (!) 122/54   Pulse: (!) 59   Resp: 20   Temp: 98.5 F (36.9 C)   SpO2: 96% 99%   Physical Exam: Cardiac:  RRR Positive systolic murmur, no rubs or gallops Lungs:  Clear throughout with mild intermittent wheezes Incisions:  N/A Extremities:  Bilateral lower extremities UNA BOOT wrapped. Media pictures show improving  sores.  Abdomen:  Positive bowel sounds, soft, flat NT/ND Neurologic: AAOX3 Person Place and Date  CBC    Component Value Date/Time   WBC 8.0 09/04/2022 0628   RBC 3.54 (L) 09/04/2022 0628   HGB 10.3 (L) 09/04/2022 0628   HCT 34.2 (L) 09/04/2022 0628   PLT 243 09/04/2022 0628   MCV 96.6 09/04/2022 0628   MCH 29.1 09/04/2022 0628   MCHC 30.1 09/04/2022 0628   RDW 22.2 (H) 09/04/2022 0628   LYMPHSABS 1.8 08/24/2022 0356   MONOABS 1.0 08/24/2022 0356   EOSABS 0.2 08/24/2022 0356   BASOSABS 0.1 08/24/2022 0356    BMET    Component Value Date/Time   NA 134 (L) 09/04/2022 0628   K 4.5 09/04/2022 0628   CL 97 (L) 09/04/2022 0628   CO2 27 09/04/2022 0628   GLUCOSE 175 (H) 09/04/2022 0628   BUN 74 (H) 09/04/2022 0628   CREATININE 2.17 (H) 09/04/2022 0628   CALCIUM 8.7 (L) 09/04/2022 0628   GFRNONAA 30 (L) 09/04/2022 0628   GFRAA >60 03/14/2018 0551    INR    Component Value Date/Time   INR 1.2 03/11/2022 2322     Intake/Output Summary (Last 24 hours) at 09/04/2022 1030 Last data filed at 09/04/2022 0913 Gross per 24 hour  Intake 480 ml  Output 1100 ml  Net -620 ml     Assessment/Plan:  82 y.o. male is s/p is a 82 y.o. male with medical history significant of sCHF with EF of 40-45%, hypertension, hyperlipidemia, diabetes mellitus, CAD, CABG, former smoker,  PVD, who presents to ED 08/22/22 with shortness of breath and worsening leg edema.   8 Days Post-Op  Plan: Due to his significant medical comorbidities that would favor a period of observation to see if he will improve without needing a secondary procedure to address the infrainguinal arterial occlusive disease.  He has good flow through the deep femoral by clinical state.  DVT prophylaxis:  Eliquis, ASA, Plavix   Drema Pry [facility] 09/04/2022 10:30 AM

## 2022-09-04 NOTE — Progress Notes (Signed)
PT Cancellation Note  Patient Details Name: Bradley [name] MRN: [id number] DOB: 1941/05/27   Cancelled Treatment:    Reason Eval/Treat Not Completed: Patient declined, no reason specified Patient states he already walked to bathroom got cleaned up and came back. States mobility specialist burley will be back at 3 to walk with him. Declines any PT at this time and declined me coming back around lunchtime when offered. Will continue to follow as needed.    Sokhna Christoph 09/04/2022, 10:23 AM

## 2022-09-04 NOTE — Progress Notes (Signed)
Mobility Specialist - Progress Note   09/04/22 1406  Mobility  Activity Ambulated independently in hallway  Level of Assistance Independent after set-up  Assistive Device Front wheel walker  Distance Ambulated (ft) 60 ft  Activity Response Tolerated well  Mobility Referral Yes  $Mobility charge 1 Mobility   Pt sitting in recliner on RA upon arrival. Pt STS and ambulates in hallway ModI. Pt returns to EOB with needs in reach and bed alarm on.   Gretchen Short  Mobility Specialist  09/04/22 2:07 PM

## 2022-09-05 ENCOUNTER — Inpatient Hospital Stay: Payer: No Typology Code available for payment source

## 2022-09-05 DIAGNOSIS — B338 Other specified viral diseases: Secondary | ICD-10-CM | POA: Insufficient documentation

## 2022-09-05 DIAGNOSIS — N179 Acute kidney failure, unspecified: Secondary | ICD-10-CM | POA: Diagnosis not present

## 2022-09-05 DIAGNOSIS — L8990 Pressure ulcer of unspecified site, unspecified stage: Secondary | ICD-10-CM | POA: Insufficient documentation

## 2022-09-05 DIAGNOSIS — I5043 Acute on chronic combined systolic (congestive) and diastolic (congestive) heart failure: Secondary | ICD-10-CM | POA: Diagnosis not present

## 2022-09-05 DIAGNOSIS — N1831 Chronic kidney disease, stage 3a: Secondary | ICD-10-CM | POA: Diagnosis not present

## 2022-09-05 DIAGNOSIS — I214 Non-ST elevation (NSTEMI) myocardial infarction: Secondary | ICD-10-CM | POA: Diagnosis not present

## 2022-09-05 DIAGNOSIS — N170 Acute kidney failure with tubular necrosis: Secondary | ICD-10-CM | POA: Diagnosis not present

## 2022-09-05 LAB — CBC
HCT: 33.1 % — ABNORMAL LOW (ref 39.0–52.0)
Hemoglobin: 10.2 g/dL — ABNORMAL LOW (ref 13.0–17.0)
MCH: 29.3 pg (ref 26.0–34.0)
MCHC: 30.8 g/dL (ref 30.0–36.0)
MCV: 95.1 fL (ref 80.0–100.0)
Platelets: 245 10*3/uL (ref 150–400)
RBC: 3.48 MIL/uL — ABNORMAL LOW (ref 4.22–5.81)
RDW: 22.1 % — ABNORMAL HIGH (ref 11.5–15.5)
WBC: 7.7 10*3/uL (ref 4.0–10.5)
nRBC: 0 % (ref 0.0–0.2)

## 2022-09-05 LAB — PROCALCITONIN: Procalcitonin: 0.18 ng/mL

## 2022-09-05 LAB — BASIC METABOLIC PANEL
Anion gap: 11 (ref 5–15)
BUN: 73 mg/dL — ABNORMAL HIGH (ref 8–23)
CO2: 26 mmol/L (ref 22–32)
Calcium: 8.5 mg/dL — ABNORMAL LOW (ref 8.9–10.3)
Chloride: 98 mmol/L (ref 98–111)
Creatinine, Ser: 2.09 mg/dL — ABNORMAL HIGH (ref 0.61–1.24)
GFR, Estimated: 31 mL/min — ABNORMAL LOW (ref >60)
Glucose, Bld: 124 mg/dL — ABNORMAL HIGH (ref 70–99)
Potassium: 4.3 mmol/L (ref 3.5–5.1)
Sodium: 135 mmol/L (ref 135–145)

## 2022-09-05 LAB — GLUCOSE, CAPILLARY
Glucose-Capillary: 126 mg/dL — ABNORMAL HIGH (ref 70–99)
Glucose-Capillary: 112 mg/dL — ABNORMAL HIGH (ref 70–99)
Glucose-Capillary: 151 mg/dL — ABNORMAL HIGH (ref 70–99)
Glucose-Capillary: 132 mg/dL — ABNORMAL HIGH (ref 70–99)

## 2022-09-05 LAB — BRAIN NATRIURETIC PEPTIDE: B Natriuretic Peptide: 3373.6 pg/mL — ABNORMAL HIGH (ref 0.0–100.0)

## 2022-09-05 LAB — MAGNESIUM: Magnesium: 2.5 mg/dL — ABNORMAL HIGH (ref 1.7–2.4)

## 2022-09-05 MED ORDER — IPRATROPIUM-ALBUTEROL 0.5-2.5 (3) MG/3ML IN SOLN: 3.0000 mL | RESPIRATORY_TRACT | Status: DC | PRN

## 2022-09-05 MED ORDER — SODIUM CHLORIDE 0.9 % IV SOLN
1.0000 g | INTRAVENOUS | Status: DC
  Administered 2022-09-05 – 2022-09-09 (×5): 1 g via INTRAVENOUS
  Filled 2022-09-05 (×4): qty 10
  Filled 2022-09-05 (×2): qty 1

## 2022-09-05 MED ORDER — IPRATROPIUM-ALBUTEROL 0.5-2.5 (3) MG/3ML IN SOLN: 3.0000 mL | Freq: Four times a day (QID) | RESPIRATORY_TRACT | Status: DC

## 2022-09-05 MED ORDER — AZITHROMYCIN 250 MG PO TABS
500.0000 mg | ORAL_TABLET | Freq: Every day | ORAL | Status: DC
  Administered 2022-09-05: 500 mg via ORAL
  Filled 2022-09-05: qty 2

## 2022-09-05 MED ORDER — METHYLPREDNISOLONE SODIUM SUCC 125 MG IJ SOLR
80.0000 mg | INTRAMUSCULAR | Status: DC
  Administered 2022-09-05 – 2022-09-06 (×2): 80 mg via INTRAVENOUS
  Filled 2022-09-05 (×2): qty 2

## 2022-09-05 MED ORDER — MOMETASONE FURO-FORMOTEROL FUM 200-5 MCG/ACT IN AERO
2.0000 | INHALATION_SPRAY | Freq: Two times a day (BID) | RESPIRATORY_TRACT | Status: DC
  Administered 2022-09-05 – 2022-09-16 (×23): 2 via RESPIRATORY_TRACT
  Filled 2022-09-05: qty 8.8

## 2022-09-05 NOTE — Progress Notes (Signed)
Rounding Note    Patient Name: Bradley [name] Date of Encounter: 09/05/2022  Foster Cardiologist: Ida Rogue, MD   Subjective   Increased wheezing today, family at the bedside reports noticing a difference, He has general malaise, increased cough Denies significant lower extremity edema or abdominal distention Diuretics on hold for acute on chronic renal failure  Inpatient Medications    Scheduled Meds:  amiodarone  100 mg Oral Daily   apixaban  2.5 mg Oral BID   aspirin EC  81 mg Oral Daily   atorvastatin  80 mg Oral QHS   azithromycin  500 mg Oral Daily   brinzolamide  1 drop Both Eyes TID   And   brimonidine  1 drop Both Eyes TID   Chlorhexidine Gluconate Cloth  6 each Topical Daily   collagenase   Topical Daily   cyanocobalamin  1,000 mcg Oral Daily   insulin aspart  0-5 Units Subcutaneous QHS   insulin aspart  0-9 Units Subcutaneous TID WC   insulin aspart  3 Units Subcutaneous TID WC   isosorbide mononitrate  30 mg Oral Daily   methylPREDNISolone (SOLU-MEDROL) injection  80 mg Intravenous Q24H   metoprolol succinate  50 mg Oral Daily   mometasone-formoterol  2 puff Inhalation BID   pantoprazole  40 mg Oral Daily   polyethylene glycol  17 g Oral BID   sacubitril-valsartan  1 tablet Oral BID   timolol  1 drop Both Eyes QHS   Travoprost (BAK Free)  1 drop Both Eyes QHS   Continuous Infusions:  sodium chloride Stopped (08/24/22 1617)   sodium chloride     cefTRIAXone (ROCEPHIN)  IV     PRN Meds: sodium chloride, sodium chloride, acetaminophen, albuterol, dextromethorphan-guaiFENesin, ipratropium-albuterol, nitroGLYCERIN, ondansetron (ZOFRAN) IV, oxyCODONE, sodium chloride flush   Vital Signs    Vitals:   09/05/22 0429 09/05/22 0730 09/05/22 0743 09/05/22 0900  BP:    112/87  Pulse:  66  66  Resp:    (!) 22  Temp:    98.9 F (37.2 C)  TempSrc:    Oral  SpO2:  100% 97% 100%  Weight: 88.4 kg     Height:        Intake/Output Summary  (Last 24 hours) at 09/05/2022 1405 Last data filed at 09/05/2022 1210 Gross per 24 hour  Intake --  Output 1300 ml  Net -1300 ml      09/05/2022    4:29 AM 09/02/2022    7:27 AM 09/01/2022    5:00 AM  Last 3 Weights  Weight (lbs) 194 lb 14.2 oz 183 lb 11.2 oz 184 lb 8.4 oz  Weight (kg) 88.4 kg 83.326 kg 83.7 kg      Telemetry    Normal sinus rhythm- Personally Reviewed  ECG     - Personally Reviewed  Physical Exam   GEN: No acute distress.   Neck: No JVD Cardiac: RRR, no murmurs, rubs, or gallops.  Respiratory: Coarse breath sounds bilaterally GI: Soft, nontender, non-distended  MS: No edema; No deformity. Neuro:  Nonfocal  Psych: Normal affect   Labs    High Sensitivity Troponin:   Recent Labs  Lab 08/28/22 0357 08/28/22 0758 08/28/22 1426 08/28/22 2138 08/29/22 0518  TROPONINIHS 26* 72* 2,572* 2,172* 1,216*     Chemistry Recent Labs  Lab 09/03/22 0642 09/04/22 0628 09/05/22 0344  NA 135 134* 135  K 4.0 4.5 4.3  CL 97* 97* 98  CO2 30 27 26   GLUCOSE  181* 175* 124*  BUN 65* 74* 73*  CREATININE 2.20* 2.17* 2.09*  CALCIUM 8.5* 8.7* 8.5*  MG 2.3 2.5* 2.5*  GFRNONAA 29* 30* 31*  ANIONGAP 8 10 11     Lipids No results for input(s): "CHOL", "TRIG", "HDL", "LABVLDL", "LDLCALC", "CHOLHDL" in the last 168 hours.  Hematology Recent Labs  Lab 09/03/22 3295 09/04/22 0628 09/05/22 0344  WBC 6.9 8.0 7.7  RBC 3.23* 3.54* 3.48*  HGB 9.4* 10.3* 10.2*  HCT 31.3* 34.2* 33.1*  MCV 96.9 96.6 95.1  MCH 29.1 29.1 29.3  MCHC 30.0 30.1 30.8  RDW 22.0* 22.2* 22.1*  PLT 202 243 245   Thyroid No results for input(s): "TSH", "FREET4" in the last 168 hours.  BNP Recent Labs  Lab 09/01/22 0616 09/05/22 0344  BNP 3,682.6* 3,373.6*    DDimer No results for input(s): "DDIMER" in the last 168 hours.   Radiology    No results found.  Cardiac Studies   Echo cardiogram Left ventricular ejection fraction, by estimation, is 40 to 45%. The  left ventricle has mildly  decreased function. The left ventricle  demonstrates global hypokinesis with severe hypokinesis of the bassal  anterior and anteroseptal wall, and inferior  wall. Left ventricular diastolic parameters are consistent with Grade II  diastolic dysfunction (pseudonormalization). There is the interventricular  septum is flattened in systole and diastole, consistent with right  ventricular pressure and volume  overload.   2. Right ventricular systolic function is normal. The right ventricular  size is normal. There is moderately elevated pulmonary artery systolic  pressure. The estimated right ventricular systolic pressure is 18.8 mmHg.   3. Left atrial size was mildly dilated.   4. Right atrial size was mildly dilated.   5. The mitral valve is normal in structure. Mild to moderate mitral valve  regurgitation. No evidence of mitral stenosis.   6. Tricuspid valve regurgitation is mild to moderate.   7. The aortic valve is tricuspid. There is moderate calcification of the  aortic valve. Aortic valve regurgitation is mild to moderate. Mild aortic  valve stenosis by Aortic valve mean gradient measures 14.3 mmHg. Aortic  valve Vmax measures 2.54  m/s.Moderate stenosis by Aortic valve area, by VTI measures 0.89 cm.   8. The inferior vena cava is normal in size with greater than 50%  respiratory variability, suggesting right atrial pressure of 3 mmHg.   Patient Profile      82 y.o. 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    Assessment & Plan    Coronary artery disease with angina Episode of angina last week treated with nitro infusion, heparin Denies significant angina since that time   Troponin peak 2500 on August 28, 2022, trending down Recent ischemic workup, cardiac catheterization 1 month ago at outside facility showing stable severe three-vessel disease, grafts patent -Medical management recommended by interventional cardiology -On aspirin, Lipitor, Imdur, metoprolol succinate   Acute on chronic respiratory distress This admission and has been treated with IV Lasix twice daily, Lasix held for worsening renal function acute on chronic, creatinine greater than 2 Lasix, Jardiance, spironolactone held -Worsening respiratory distress, several days ago diagnosed with RSV positive, bronchospastic cough consistent with viral etiology   Arctic valve stenosis Moderate, outpatient follow-up   PAD with critical  limb ischemia  nonhealing wounds on the feet Status post right external iliac stenting this admission Occlusion of right SFA On aspirin, Eliquis, statin   Paroxysmal atrial fibrillation  on amiodarone and metoprolol, Eliquis Maintaining normal sinus rhythm   Acute on chronic kidney failure Diuretics on hold, this admission received contrast for PV procedure   Deconditioning/slow healing foot ulcers     Total encounter time more than 50 minutes  Greater than 50% was spent in counseling and coordination of care with the patient   For questions or updates, please contact  [facility] Please consult [email or web address] for contact info under        Signed, Julien Nordmann, MD  09/05/2022, 2:05 PM

## 2022-09-05 NOTE — Consult Note (Addendum)
[facility]  CONSULT NOTE    Date: 09/05/2022                  Patient Name:  Bradley [name]  MRN: [id number]  DOB: June 11, 1941  Age / Sex: 82 y.o., male         PCP: System, Provider Not In                 Service Requesting Consult: [place]                 Reason for Consult: Chronic kidney disease stage IIIA            History of Present Illness: Bradley [name] is a 82 y.o.  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  Alert and oriented.  Patient states he is room air at baseline.  He is normally able to tolerate daily ADLs.  Patient states he has progressively became short of breath over the past several weeks.  Requiring frequent rest breaks during ADLs.  Denies known fever or chills.  Also reports mild chest discomfort.  Denies nausea, vomiting, or diarrhea.  States he continues to urinate as usual.  Baseline GFR appears to be between 35 and 37.  In December 2023.  Appears elevated on admission at 2.84.  Patient has received multiple rounds of IV diuretics during this admission resulting in fluctuating creatinine.  Echo completed on 08/29/2022 confirms EF of 40 to 45% with grade 2 diastolic dysfunction mildly dilated right and left atrium, mild to moderate MVR and TVR.  Also notes moderate aortic valve calcification and stenosis with mild to moderate AVR  Medications: Outpatient medications: Medications Prior  to Admission  Medication Sig Dispense Refill Last Dose   acetaminophen (TYLENOL) 325 MG tablet Take 650-1,300 mg by mouth 2 (two) times daily as needed.   Past Week   amiodarone (PACERONE) 200 MG tablet Take 100 mg by mouth daily.   Past Week   apixaban (ELIQUIS) 2.5 MG TABS tablet Take 2.5 mg by mouth 2 (two) times daily.   Past Week   atorvastatin (LIPITOR) 80 MG tablet TAKE ONE TABLET BY MOUTH AT BEDTIME FOR CHOLESTEROL   Past Week   Brinzolamide-Brimonidine 1-0.2 % SUSP Place 1 drop into the left eye 3 (three) times daily.   Past Week   carboxymethylcellulose 1 % ophthalmic solution Apply 1 drop to eye at bedtime.   Past Week   empagliflozin (JARDIANCE) 25 MG TABS tablet Take 12.5 mg by mouth every morning.   Past Week   furosemide (LASIX) 40 MG tablet Take 1 tablet (40 mg total) by mouth daily. 30 tablet 2 Past Week   metoprolol succinate (TOPROL-XL) 50 MG 24 hr tablet Take by mouth.   Past Week   nitroGLYCERIN (NITROSTAT) 0.4 MG SL tablet Place 1 tablet (0.4 mg total) under the tongue every 5 (five) minutes x 3 doses as needed for chest pain. 30 tablet 2 prn   pantoprazole (PROTONIX) 40 MG tablet Take 40 mg  by mouth daily.   Past Week   polyethylene glycol (MIRALAX / GLYCOLAX) 17 g packet Take 17 g by mouth daily. 14 each 0 prn   Propylene Glycol 0.6 % SOLN Place 1 drop into both eyes 4 (four) times daily.   Past Week   sacubitril-valsartan (ENTRESTO) 24-26 MG Take 1 tablet by mouth every 12 (twelve) hours.   Past Week   spironolactone (ALDACTONE) 25 MG tablet Take 1 tablet by mouth daily.   Past Week   timolol (TIMOPTIC-XR) 0.5 % ophthalmic gel-forming Place 1 drop into both eyes daily.   Past Week   travoprost, benzalkonium, (TRAVATAN) 0.004 % ophthalmic solution Place 1 drop into both eyes nightly.   Past Week   vitamin B-12 (CYANOCOBALAMIN) 500 MCG tablet Take 1,000 mcg by mouth daily. Before breakfast   Past Week   apixaban (ELIQUIS) 5 MG TABS tablet Take 1 tablet (5 mg total) by mouth  2 (two) times daily. (Patient not taking: Reported on 08/27/2022) 60 tablet 0 Not Taking   clopidogrel (PLAVIX) 75 MG tablet Take 1 tablet (75 mg total) by mouth daily with breakfast. (Patient not taking: Reported on 08/22/2022) 30 tablet 2 Not Taking   glipiZIDE (GLUCOTROL) 5 MG tablet Take by mouth. (Patient not taking: Reported on 08/27/2022)   Not Taking   HYDROcodone-acetaminophen (NORCO/VICODIN) 5-325 MG tablet Take 1 tablet by mouth every 6 (six) hours as needed for moderate pain. (Patient not taking: Reported on 08/27/2022) 20 tablet 0 Not Taking   isosorbide mononitrate (IMDUR) 60 MG 24 hr tablet Take 1 tablet (60 mg total) by mouth daily. (Patient not taking: Reported on 08/27/2022) 30 tablet 0 Not Taking    Current medications: Current Facility-Administered Medications  Medication Dose Route Frequency Provider Last Rate Last Admin   0.9 %  sodium chloride infusion   Intravenous PRN Schnier, Latina Craver, MD   Stopped at 08/24/22 1617   0.9 %  sodium chloride infusion  250 mL Intravenous PRN Schnier, Latina Craver, MD       acetaminophen (TYLENOL) tablet 1,000 mg  1,000 mg Oral TID PRN Darlin Priestly, MD       albuterol (PROVENTIL) (2.5 MG/3ML) 0.083% nebulizer solution 3 mL  3 mL Nebulization Q4H PRN Schnier, Latina Craver, MD   3 mL at 09/01/22 2228   amiodarone (PACERONE) tablet 100 mg  100 mg Oral Daily Schnier, Latina Craver, MD   100 mg at 09/05/22 1000   apixaban (ELIQUIS) tablet 2.5 mg  2.5 mg Oral BID Creig Hines, NP   2.5 mg at 09/05/22 1001   aspirin EC tablet 81 mg  81 mg Oral Daily End, Christopher, MD   81 mg at 09/05/22 1000   atorvastatin (LIPITOR) tablet 80 mg  80 mg Oral QHS Schnier, Latina Craver, MD   80 mg at 09/04/22 2153   azithromycin (ZITHROMAX) tablet 500 mg  500 mg Oral Daily Marrion Coy, MD   500 mg at 09/05/22 1558   brinzolamide (AZOPT) 1 % ophthalmic suspension 1 drop  1 drop Both Eyes TID Renford Dills, MD   1 drop at 09/05/22 1559   And   brimonidine (ALPHAGAN) 0.2  % ophthalmic solution 1 drop  1 drop Both Eyes TID Renford Dills, MD   1 drop at 09/05/22 1559   cefTRIAXone (ROCEPHIN) 1 g in sodium chloride 0.9 % 100 mL IVPB  1 g Intravenous Q24H Marrion Coy, MD 200 mL/hr at 09/05/22 1614 1 g at 09/05/22 1614  Chlorhexidine Gluconate Cloth 2 % PADS 6 each  6 each Topical Daily Enzo Bi, MD   6 each at 09/05/22 1042   collagenase (SANTYL) ointment   Topical Daily Schnier, Dolores Lory, MD   Given at 09/05/22 1839   cyanocobalamin (VITAMIN B12) tablet 1,000 mcg  1,000 mcg Oral Daily Schnier, Dolores Lory, MD   1,000 mcg at 09/05/22 1001   dextromethorphan-guaiFENesin ([place] DM) 30-600 MG per 12 hr tablet 1 tablet  1 tablet Oral BID PRN Schnier, Dolores Lory, MD       insulin aspart (novoLOG) injection 0-5 Units  0-5 Units Subcutaneous QHS Katha Cabal, MD   2 Units at 08/31/22 2056   insulin aspart (novoLOG) injection 0-9 Units  0-9 Units Subcutaneous TID WC Schnier, Dolores Lory, MD   2 Units at 09/05/22 1705   insulin aspart (novoLOG) injection 3 Units  3 Units Subcutaneous TID WC Enzo Bi, MD   3 Units at 09/05/22 1705   ipratropium-albuterol (DUONEB) 0.5-2.5 (3) MG/3ML nebulizer solution 3 mL  3 mL Nebulization Q4H PRN Sharen Hones, MD       isosorbide mononitrate (IMDUR) 24 hr tablet 30 mg  30 mg Oral Daily Theora Gianotti, NP   30 mg at 09/05/22 1000   methylPREDNISolone sodium succinate (SOLU-MEDROL) 125 mg/2 mL injection 80 mg  80 mg Intravenous Q24H Sharen Hones, MD   80 mg at 09/05/22 1558   metoprolol succinate (TOPROL-XL) 24 hr tablet 50 mg  50 mg Oral Daily Schnier, Dolores Lory, MD   50 mg at 09/05/22 1001   mometasone-formoterol (DULERA) 200-5 MCG/ACT inhaler 2 puff  2 puff Inhalation BID Sharen Hones, MD   2 puff at 09/05/22 1224   nitroGLYCERIN (NITROSTAT) SL tablet 0.4 mg  0.4 mg Sublingual Q5 Min x 3 PRN Schnier, Dolores Lory, MD   0.4 mg at 08/28/22 0343   ondansetron (ZOFRAN) injection 4 mg  4 mg Intravenous Q6H PRN Schnier, Dolores Lory,  MD   4 mg at 09/02/22 5400   oxyCODONE (Oxy IR/ROXICODONE) immediate release tablet 5 mg  5 mg Oral Q4H PRN Enzo Bi, MD   5 mg at 09/05/22 1606   pantoprazole (PROTONIX) EC tablet 40 mg  40 mg Oral Daily Schnier, Dolores Lory, MD   40 mg at 09/05/22 1001   polyethylene glycol (MIRALAX / GLYCOLAX) packet 17 g  17 g Oral BID Enzo Bi, MD   17 g at 09/03/22 0908   sacubitril-valsartan (ENTRESTO) 24-26 mg per tablet  1 tablet Oral BID Theora Gianotti, NP   1 tablet at 09/05/22 1000   sodium chloride flush (NS) 0.9 % injection 3 mL  3 mL Intravenous PRN Schnier, Dolores Lory, MD       timolol (TIMOPTIC) 0.5 % ophthalmic solution 1 drop  1 drop Both Eyes QHS Schnier, Dolores Lory, MD   1 drop at 09/04/22 2154   Travoprost (BAK Free) (TRAVATAN) 0.004 % ophthalmic solution SOLN 1 drop  1 drop Both Eyes QHS Schnier, Dolores Lory, MD   1 drop at 09/04/22 2153      Allergies: Allergies  Allergen Reactions   Simvastatin Other (See Comments)    Per [place] records Per [facility] records  Per [facility] records Per [place] records      Past Medical History: Past Medical History:  Diagnosis Date   (HFpEF) heart failure with preserved ejection fraction ([place])    Coronary artery disease    Diabetes mellitus without complication ([place])    Hyperlipidemia  Hypertension      Past Surgical History: Past Surgical History:  Procedure Laterality Date   ABDOMINAL AORTOGRAM W/LOWER EXTREMITY N/A 08/27/2022   Procedure: ABDOMINAL AORTOGRAM W/LOWER EXTREMITY;  Surgeon: Renford Dills, MD;  Location: [facility] INVASIVE CV LAB;  Service: Cardiovascular;  Laterality: N/A;   CATARACT EXTRACTION     CHOLECYSTECTOMY     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   [facility]: LIMA-LAD, SVG-OM1, and SVG-rPDA   INTRAMEDULLARY (IM) NAIL INTERTROCHANTERIC Right 03/12/2022   Procedure: INTRAMEDULLARY (IM) NAIL INTERTROCHANTRIC;  Surgeon: Christena Flake, MD;  Location: [facility] ORS;  Service: Orthopedics;  Laterality: Right;   LEFT HEART CATH  AND CORS/GRAFTS ANGIOGRAPHY N/A 07/10/2021   Procedure: LEFT HEART CATH AND CORS/GRAFTS ANGIOGRAPHY;  Surgeon: Yvonne Kendall, MD;  Location: [facility] INVASIVE CV LAB;  Service: Cardiovascular;  Laterality: N/A;     Family History: Family History  Problem Relation Age of Onset   CAD Mother    Urinary tract infection Father    Hip fracture Father    Cancer Sister    Cancer Brother      Social History: Social History   Socioeconomic History   Marital status: Widowed    Spouse name: Not on file   Number of children: Not on file   Years of education: Not on file   Highest education level: Not on file  Occupational History   Not on file  Tobacco Use   Smoking status: Former   Smokeless tobacco: Never  Vaping Use   Vaping Use: Never used  Substance and Sexual Activity   Alcohol use: Never   Drug use: Never   Sexual activity: Not on file  Other Topics Concern   Not on file  Social History Narrative   Not on file   Social Determinants of Health   Financial Resource Strain: Not on file  Food Insecurity: No Food Insecurity (08/23/2022)   Hunger Vital Sign    Worried About Running Out of Food in the Last Year: Never true    Ran Out of Food in the Last Year: Never true  Transportation Needs: No Transportation Needs (08/23/2022)   PRAPARE - Administrator, Civil Service (Medical): No    Lack of Transportation (Non-Medical): No  Physical Activity: Not on file  Stress: Not on file  Social Connections: Not on file  Intimate Partner Violence: Not At Risk (08/23/2022)   Humiliation, Afraid, Rape, and Kick questionnaire    Fear of Current or Ex-Partner: No    Emotionally Abused: No    Physically Abused: No    Sexually Abused: No     Review of Systems: Review of Systems  Constitutional:  Negative for chills, fever and malaise/fatigue.  HENT:  Negative for congestion, sore throat and tinnitus.   Eyes:  Negative for blurred vision and redness.  Respiratory:   Positive for shortness of breath. Negative for cough and wheezing.   Cardiovascular:  Positive for leg swelling. Negative for chest pain, palpitations and claudication.  Gastrointestinal:  Negative for abdominal pain, blood in stool, diarrhea, nausea and vomiting.  Genitourinary:  Negative for flank pain, frequency and hematuria.  Musculoskeletal:  Negative for back pain, falls and myalgias.  Skin:  Negative for rash.  Neurological:  Negative for dizziness, weakness and headaches.  Endo/Heme/Allergies:  Does not bruise/bleed easily.  Psychiatric/Behavioral:  Negative for depression. The patient is not nervous/anxious and does not have insomnia.     Vital Signs: Blood pressure (!) 136/59, pulse (!) 59, temperature  98.4 F (36.9 C), resp. rate 16, height 6' (1.829 m), weight 88.4 kg, SpO2 99 %.  Weight trends: Filed Weights   09/01/22 0500 09/02/22 0727 09/05/22 0429  Weight: 83.7 kg 83.3 kg 88.4 kg    Physical Exam: General: NAD  Head: Normocephalic, atraumatic. Moist oral mucosal membranes  Eyes: Anicteric  Lungs:  Basilar crackles, normal effort, 2 L [place]  Heart: Regular rate and rhythm  Abdomen:  Soft, nontender  Extremities: No peripheral edema.  Neurologic: Alert and oriented, moving all four extremities  Skin: No lesions  Access: None     Lab results: Basic Metabolic Panel: Recent Labs  Lab 09/01/22 0621 09/02/22 0520 09/03/22 0642 09/04/22 0628 09/05/22 0344  NA 137 135 135 134* 135  K 4.2 4.2 4.0 4.5 4.3  CL 98 96* 97* 97* 98  CO2 29 29 30 27 26   GLUCOSE 135* 166* 181* 175* 124*  BUN 51* 55* 65* 74* 73*  CREATININE 1.83* 1.92* 2.20* 2.17* 2.09*  CALCIUM 8.8* 8.6* 8.5* 8.7* 8.5*  MG 1.9 2.1 2.3 2.5* 2.5*    Liver Function Tests: No results for input(s): "AST", "ALT", "ALKPHOS", "BILITOT", "PROT", "ALBUMIN" in the last 168 hours. No results for input(s): "LIPASE", "AMYLASE" in the last 168 hours. No results for input(s): "AMMONIA" in the last 168  hours.  CBC: Recent Labs  Lab 09/01/22 0621 09/02/22 0520 09/03/22 0642 09/04/22 0628 09/05/22 0344  WBC 8.0 7.8 6.9 8.0 7.7  HGB 9.7* 10.1* 9.4* 10.3* 10.2*  HCT 32.4* 33.2* 31.3* 34.2* 33.1*  MCV 96.7 95.7 96.9 96.6 95.1  PLT 187 206 202 243 245    Cardiac Enzymes: No results for input(s): "CKTOTAL", "CKMB", "CKMBINDEX", "TROPONINI" in the last 168 hours.  BNP: Invalid input(s): "POCBNP"  CBG: Recent Labs  Lab 09/04/22 1740 09/04/22 2116 09/05/22 0908 09/05/22 1208 09/05/22 1700  GLUCAP 137* 127* 126* 112* 151*    Microbiology: Results for orders placed or performed during the hospital encounter of 08/22/22  Blood culture (routine x 2)     Status: None   Collection Time: 08/22/22  4:20 PM   Specimen: BLOOD  Result Value Ref Range Status   Specimen Description BLOOD BLOOD RIGHT WRIST  Final   Special Requests   Final    BOTTLES DRAWN AEROBIC AND ANAEROBIC Blood Culture adequate volume   Culture   Final    NO GROWTH 5 DAYS Performed at [facility], [address], [place], [place]    Report Status 08/27/2022 FINAL  Final  Blood culture (routine x 2)     Status: None   Collection Time: 08/22/22  4:20 PM   Specimen: BLOOD  Result Value Ref Range Status   Specimen Description BLOOD BLOOD RIGHT FOREARM  Final   Special Requests   Final    BOTTLES DRAWN AEROBIC AND ANAEROBIC Blood Culture adequate volume   Culture   Final    NO GROWTH 5 DAYS Performed at [facility], [address] [place], [place]    Report Status 08/27/2022 FINAL  Final  Resp panel by RT-PCR (RSV, Flu A&B, Covid)     Status: Abnormal   Collection Time: 09/02/22  4:51 PM  Result Value Ref Range Status   SARS Coronavirus 2 by RT PCR NEGATIVE NEGATIVE Final    Comment: (NOTE) SARS-CoV-2 target nucleic acids are NOT DETECTED.  The SARS-CoV-2 RNA is generally detectable in upper respiratory specimens during the acute phase of infection. The  lowest concentration of  SARS-CoV-2 viral copies this assay can detect is 138 copies/mL. A negative result does not preclude SARS-Cov-2 infection and should not be used as the sole basis for treatment or other patient management decisions. A negative result may occur with  improper specimen collection/handling, submission of specimen other than nasopharyngeal swab, presence of viral mutation(s) within the areas targeted by this assay, and inadequate number of viral copies(<138 copies/mL). A negative result must be combined with clinical observations, patient history, and epidemiological information. The expected result is Negative.  Fact Sheet for Patients:  BloggerCourse.comhttps://www.fda.gov/media/152166/download  Fact Sheet for Healthcare Providers:  SeriousBroker.ithttps://www.fda.gov/media/152162/download  This test is no t yet approved or cleared by the [place] FDA and  has been authorized for detection and/or diagnosis of SARS-CoV-2 by FDA under an Emergency Use Authorization (EUA). This EUA will remain  in effect (meaning this test can be used) for the duration of the COVID-19 declaration under Section 564(b)(1) of the Act, 21 U.S.C.section 360bbb-3(b)(1), unless the authorization is terminated  or revoked sooner.       Influenza A by PCR NEGATIVE NEGATIVE Final   Influenza B by PCR NEGATIVE NEGATIVE Final    Comment: (NOTE) The Xpert Xpress SARS-CoV-2/FLU/RSV plus assay is intended as an aid in the diagnosis of influenza from Nasopharyngeal swab specimens and should not be used as a sole basis for treatment. Nasal washings and aspirates are unacceptable for Xpert Xpress SARS-CoV-2/FLU/RSV testing.  Fact Sheet for Patients: BloggerCourse.comhttps://www.fda.gov/media/152166/download  Fact Sheet for Healthcare Providers: SeriousBroker.ithttps://www.fda.gov/media/152162/download  This test is not yet approved or cleared by the [place] FDA and has been authorized for detection and/or diagnosis of SARS-CoV-2 by FDA under  an Emergency Use Authorization (EUA). This EUA will remain in effect (meaning this test can be used) for the duration of the COVID-19 declaration under Section 564(b)(1) of the Act, 21 U.S.C. section 360bbb-3(b)(1), unless the authorization is terminated or revoked.     Resp Syncytial Virus by PCR POSITIVE (A) NEGATIVE Final    Comment: (NOTE) Fact Sheet for Patients: BloggerCourse.comhttps://www.fda.gov/media/152166/download  Fact Sheet for Healthcare Providers: SeriousBroker.ithttps://www.fda.gov/media/152162/download  This test is not yet approved or cleared by the [place] FDA and has been authorized for detection and/or diagnosis of SARS-CoV-2 by FDA under an Emergency Use Authorization (EUA). This EUA will remain in effect (meaning this test can be used) for the duration of the COVID-19 declaration under Section 564(b)(1) of the Act, 21 U.S.C. section 360bbb-3(b)(1), unless the authorization is terminated or revoked.  Performed at [facility], [address] [place], [place] [id number]     Coagulation Studies: No results for input(s): "LABPROT", "INR" in the last 72 hours.  Urinalysis: No results for input(s): "COLORURINE", "LABSPEC", "PHURINE", "GLUCOSEU", "HGBUR", "BILIRUBINUR", "KETONESUR", "PROTEINUR", "UROBILINOGEN", "NITRITE", "LEUKOCYTESUR" in the last 72 hours.  Invalid input(s): "APPERANCEUR"    Imaging: DG Chest 2 View  Result Date: 09/05/2022 CLINICAL DATA:  Possible pneumonia EXAM: CHEST - 2 VIEW COMPARISON:  Chest x-ray dated September 02, 2022 FINDINGS: Unchanged cardiomegaly. Prior median sternotomy and CABG. Similar interstitial opacities and small bilateral pleural effusions. Lower lung predominant linear opacities are likely due to atelectasis. No evidence of pneumothorax. IMPRESSION: Similar interstitial opacities and small bilateral pleural effusions, likely due to pulmonary edema. Electronically Signed   By: Allegra LaiLeah  Strickland M.D.   On: 09/05/2022 17:55     Assessment  & Plan: Bradley [name] is a 82 y.o.  male with past medical conditions of hyperlipidemia, hypertension, chronic systolic heart failure with EF 40 to  45%, PVD, and chronic kidney disease stage III a, who was admitted to [facility] on 08/22/2022 for Acute on chronic combined systolic and diastolic CHF (congestive heart failure) ([facility]) [I50.43] Cellulitis of lower extremity, unspecified laterality [L03.119] Acute on chronic congestive heart failure, unspecified heart failure type ([facility]) [I50.9]  1.  Acute kidney injury on chronic kidney disease stage III A.  Baseline GFR appears to be between 35 and 37.  Current GFR 31, has been as low as 29.  Agree with initially holding oral diuretics to allow renal recovery.  Was prescribed oral furosemide 60 mg daily.  Based on physical assessment, patient not overly overloaded.  Will recommend decreasing daily dose of oral furosemide.  If unsuccessful, may consider IV furosemide drip, as it also may  cause worsening renal function.   2.  Acute on chronic systolic heart failure.  Echo completed on 08/28/2022 shows EF 40 to 45% with grade 2 diastolic dysfunction.  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  Also notes aortic calcification with stenosis.  Cardiology following.  Diuretics currently held due to worsening kidney failure.  Suggest continuing oral furosemide at decreased dose.   LOS: 14 Shantelle Breeze 1/11/20247:12 PM   Case reviewed and plan of care was formulated and discussed with NP.  I agree with the note as documented.  Concern about fluid overload in setting of AKI.Marland Kitchen BNP is high and CXR favors pulm edema BP in 110-120 systolic range Requiring some O2 - about 1 L/min as documented  Plan to observe today Will reassess tomorrow for need of re-initiation of diuretic Avoid Hypotension, NSAIDs and iv contrast

## 2022-09-05 NOTE — TOC Progression Note (Signed)
Transition of Care [facility]) - Progression Note    Patient Details  Name: Ikey Omary MRN: [id number] Date of Birth: 1940/09/21  Transition of Care [facility]) CM/SW Contact  Beverly Sessions, RN Phone Number: 09/05/2022, 11:35 AM  Clinical Narrative:     Per [name] with [facility] has been obtained for home health at discharge   Expected Discharge Plan: [place] Barriers to Discharge: Continued Medical Work up  Expected Discharge Plan and Services   Discharge Planning Services: CM Consult   Living arrangements for the past 2 months: [place]                                       Social Determinants of Health (SDOH) Interventions SDOH Screenings   Food Insecurity: No Food Insecurity (08/23/2022)  Housing: Low Risk  (08/23/2022)  Transportation Needs: No Transportation Needs (08/23/2022)  Utilities: Not At Risk (08/23/2022)  Tobacco Use: Medium Risk (08/27/2022)    Readmission Risk Interventions     No data to display

## 2022-09-05 NOTE — Progress Notes (Signed)
Progress Note   Patient: Bradley [name] NUU:[id number] DOB: [birth year] DOA: 08/22/2022     14 DOS: the patient was seen and examined on 09/05/2022   Brief hospital course: Kodah Maret is a 82 y.o. 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 Patient stated that he has chronic shortness of breath, which has been progressively worsening in the past several weeks, much worse day of arrival to ED, assoc w/ bilateral leg edema and legs oozing fluid. According to the patient he was sent from the [place] to our hospital with concerns for leg cellulitis. 12/28: pt was found to have BNP > 4500,  WBC 11.1, potassium 5.7, worsening renal function w/ Cr 2.84, VSS 121/54, oxygen saturation 100% on room air.  Chest x-ray showed low volume without infiltration.  Patient was admitted to telemetry bed as inpatient for tx CHF 12/30: Debridement R foot wound w/ podiatry. Vascular surgery planning angiography pending improvementin renal function. 12/31: renal function about same Cr 1.99. Net IO Since Admission: -4,208.77 mL [08/25/22 1516] 01/01: renal fxn improving to Cr 1.78. Angiography planned for tomorrow  01/02: LE angiography w/ stent to R external iliac artery.  1/11.  Patient was found to have RSV positive on 1/8.  Renal function was worse after IV Lasix, but has been better gradually since discontinuation of IV Lasix 3 days ago.       Patient became more short of breath today with some wheezing.  Started steroids and antibiotics.       Assessment and Plan: RSV infection. Patient developed short of breath and wheezing today.  He also has a cough, with a white mucus.  He has a mild elevation of procalcitonin level.  He also has some bronchospasm.  I am concerned that patient may develop secondary bacterial pneumonia.  Will start  antibiotics with Rocephin and Zithromax. Obtain chest x-ray. Also added Solu-Medrol for bronchospasm.  Continue bronchodilator.   Acute on chronic combined systolic and diastolic CHF (congestive heart failure) ([place]) 2D echo on 03/12/2022 showed EF of 40-45% with grade 2 diastolic dysfunction.  BNP> 4500. --has been diuresed with IV lasix 80 BID per cardio Diuretics on hold due to worsening renal function, currently still on Entresto and metoprolol. Discussed with cardiology and nephrology, they will decide patient volume status and whether diuretics can be restarted.   Acute renal failure superimposed on stage 3a chronic kidney disease ([place]):  Baseline creatinine 1.38 on 03/14/2022.   On Admission, Cr 2.84 Renal function is better, but not at baseline.  Consult nephrology.   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 --Podiatry --> saw patient 12/30 and performed excisional debridement of the ulcerative areas on the right forefoot.  Plan: --cont dressing change per order   PVD (peripheral vascular disease) ([place]) S/p LE angiography w/ stent to R external iliac artery on 08/28/22 --pt has improved perfusion after the stent placement. Continue statin and aspirin.       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  --  chest pain morning of 1/3 with increasing trop from 72 to 2572, trending down since. Completed course of heparin drip, continue aspirin and statin.  Followed by cardiology.   Moderate aortic stenosis Recent echo done showed moderate aortic stenosis at [facility] TAVR is not  indicated at this time and cardiology is following   HLD (hyperlipidemia) Continue Lipitor.   Type II diabetes mellitus with renal manifestations [facility]):  Recent A1c 7.8.   Poorly controlled.   Continue current insulin regimen, resume home regimen at discharge.   Paroxysmal atrial fibrillation (HCC) s/p Cardioversion Amiodarone 100 mg po daily and Metoprolol Succinate 50 mg po Daily --cont Eliquis   Hyperkalemia:  Improved.   Normocytic Anemia --No overt bleeding noted monitor CBC       Acute on chronic combined systolic and diastolic CHF (congestive heart failure) ([place]) 2D echo on 03/12/2022 showed EF of 40-45% with grade 2 diastolic dysfunction.  BNP> 4500. --has been diuresed with IV lasix 80 BID per cardio Diuretics on hold due to worsening renal function, currently still on Entresto and metoprolol. Currently, volume status has improved.   Acute renal failure superimposed on stage 3a chronic kidney disease ([place]):  Baseline creatinine 1.38 on 03/14/2022.   On Admission, Cr 2.84 Renal function is still higher than baseline, will give a dose of albumin.   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 --Podiatry --> saw patient 12/30 and performed excisional debridement of the ulcerative areas on the right forefoot.  Plan: --cont dressing change per order   PVD (peripheral vascular disease) ([place]) S/p LE angiography w/ stent to R external iliac artery on 08/28/22 --pt has improved perfusion after the stent placement. Continue statin and aspirin.       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  --chest pain morning of 1/3 with increasing trop from 72 to 2572, trending down since. Completed course of heparin drip, continue aspirin and statin.  Followed by cardiology.   Moderate aortic stenosis Recent echo done showed moderate aortic stenosis at [facility] TAVR is not indicated at this time and cardiology is following   HLD (hyperlipidemia) Continue Lipitor.   Type II diabetes mellitus with renal manifestations [facility]):  Recent A1c 7.8.   Poorly controlled.   Continue current insulin regimen, resume home regimen at discharge.   Paroxysmal atrial fibrillation (HCC) s/p Cardioversion Amiodarone 100 mg po daily and Metoprolol Succinate 50 mg po Daily --cont Eliquis   Hyperkalemia:  Improved.   Normocytic Anemia --No overt bleeding noted monitor CBC       Subjective:  Patient became more short of breath today, he has a cough, with minimal white mucus.  No fever or chills.  Physical Exam: Vitals:   09/05/22 0429 09/05/22 0730 09/05/22 0743 09/05/22 0900  BP:    112/87  Pulse:  66  66  Resp:    (!) 22  Temp:    98.9 F (37.2 C)  TempSrc:    Oral  SpO2:  100% 97% 100%  Weight: 88.4 kg     Height:       General exam: Appears calm and comfortable  Respiratory  system: Decreased breathing sounds with wheezes. Respiratory effort normal. Cardiovascular system: S1 & S2 heard, RRR. No JVD, murmurs, rubs, gallops or clicks. No pedal edema. Gastrointestinal system: Abdomen is nondistended, soft and nontender. No organomegaly or masses felt. Normal bowel sounds heard. Central nervous system: Alert and oriented x2. No focal neurological deficits. Extremities: Symmetric 5 x 5 power. Skin: No rashes, lesions or ulcers Psychiatry:  Mood & affect appropriate.   Data Reviewed:  Lab results reviewed.  Family Communication: Niece updated  Disposition: Status is: Inpatient Remains inpatient appropriate because: Severity of disease, high risk of  deterioration.  Planned Discharge Destination: Home with Home Health    Time spent: 50 minutes  Author: Marrion Coy, MD 09/05/2022 1:13 PM  For on call review [email or web address].

## 2022-09-05 NOTE — Progress Notes (Signed)
PT Cancellation Note  Patient Details Name: Bradley [name] MRN: [id number] DOB: 18-Jun-1941   Cancelled Treatment:    Reason Eval/Treat Not Completed: Patient declined, no reason specified Patient states he is not feeling well, sob and coughing. Will re-attempt later as time allows.   Jayshon Dommer 09/05/2022, 10:30 AM

## 2022-09-06 ENCOUNTER — Inpatient Hospital Stay: Payer: No Typology Code available for payment source

## 2022-09-06 DIAGNOSIS — E1122 Type 2 diabetes mellitus with diabetic chronic kidney disease: Secondary | ICD-10-CM

## 2022-09-06 DIAGNOSIS — L03119 Cellulitis of unspecified part of limb: Secondary | ICD-10-CM | POA: Diagnosis not present

## 2022-09-06 DIAGNOSIS — N1832 Chronic kidney disease, stage 3b: Secondary | ICD-10-CM

## 2022-09-06 DIAGNOSIS — B338 Other specified viral diseases: Secondary | ICD-10-CM | POA: Diagnosis not present

## 2022-09-06 DIAGNOSIS — N170 Acute kidney failure with tubular necrosis: Secondary | ICD-10-CM

## 2022-09-06 DIAGNOSIS — R7989 Other specified abnormal findings of blood chemistry: Secondary | ICD-10-CM | POA: Insufficient documentation

## 2022-09-06 DIAGNOSIS — N1831 Chronic kidney disease, stage 3a: Secondary | ICD-10-CM | POA: Diagnosis not present

## 2022-09-06 DIAGNOSIS — I272 Pulmonary hypertension, unspecified: Secondary | ICD-10-CM | POA: Insufficient documentation

## 2022-09-06 DIAGNOSIS — I5043 Acute on chronic combined systolic (congestive) and diastolic (congestive) heart failure: Secondary | ICD-10-CM | POA: Diagnosis not present

## 2022-09-06 DIAGNOSIS — I214 Non-ST elevation (NSTEMI) myocardial infarction: Secondary | ICD-10-CM | POA: Diagnosis not present

## 2022-09-06 DIAGNOSIS — I509 Heart failure, unspecified: Secondary | ICD-10-CM | POA: Diagnosis not present

## 2022-09-06 LAB — GLUCOSE, CAPILLARY
Glucose-Capillary: 174 mg/dL — ABNORMAL HIGH (ref 70–99)
Glucose-Capillary: 195 mg/dL — ABNORMAL HIGH (ref 70–99)
Glucose-Capillary: 229 mg/dL — ABNORMAL HIGH (ref 70–99)
Glucose-Capillary: 284 mg/dL — ABNORMAL HIGH (ref 70–99)

## 2022-09-06 LAB — BASIC METABOLIC PANEL
Anion gap: 12 (ref 5–15)
BUN: 67 mg/dL — ABNORMAL HIGH (ref 8–23)
CO2: 23 mmol/L (ref 22–32)
Calcium: 8.7 mg/dL — ABNORMAL LOW (ref 8.9–10.3)
Chloride: 99 mmol/L (ref 98–111)
Creatinine, Ser: 1.8 mg/dL — ABNORMAL HIGH (ref 0.61–1.24)
GFR, Estimated: 37 mL/min — ABNORMAL LOW (ref >60)
Glucose, Bld: 178 mg/dL — ABNORMAL HIGH (ref 70–99)
Potassium: 4.8 mmol/L (ref 3.5–5.1)
Sodium: 134 mmol/L — ABNORMAL LOW (ref 135–145)

## 2022-09-06 LAB — TROPONIN I (HIGH SENSITIVITY)
Troponin I (High Sensitivity): 60 ng/L — ABNORMAL HIGH (ref <18)
Troponin I (High Sensitivity): 54 ng/L — ABNORMAL HIGH (ref <18)

## 2022-09-06 LAB — MAGNESIUM: Magnesium: 2.6 mg/dL — ABNORMAL HIGH (ref 1.7–2.4)

## 2022-09-06 MED ORDER — DOXYCYCLINE HYCLATE 100 MG PO TABS
100.0000 mg | ORAL_TABLET | Freq: Two times a day (BID) | ORAL | Status: AC
  Administered 2022-09-06 – 2022-09-09 (×8): 100 mg via ORAL
  Filled 2022-09-06 (×8): qty 1

## 2022-09-06 MED ORDER — FUROSEMIDE 10 MG/ML IJ SOLN
40.0000 mg | Freq: Every day | INTRAMUSCULAR | Status: DC
  Administered 2022-09-06: 40 mg via INTRAVENOUS
  Filled 2022-09-06: qty 4

## 2022-09-06 MED ORDER — METHOCARBAMOL 500 MG PO TABS
500.0000 mg | ORAL_TABLET | Freq: Four times a day (QID) | ORAL | Status: DC | PRN
  Administered 2022-09-06 – 2022-09-08 (×2): 500 mg via ORAL
  Filled 2022-09-06 (×2): qty 1

## 2022-09-06 MED ORDER — MORPHINE SULFATE (PF) 2 MG/ML IV SOLN
2.0000 mg | Freq: Once | INTRAVENOUS | Status: AC
  Administered 2022-09-06: 2 mg via INTRAVENOUS
  Filled 2022-09-06: qty 1

## 2022-09-06 NOTE — Progress Notes (Addendum)
[facility]  ROUNDING NOTE   Subjective:   Patient seen resting in bed, no family at bedside Alert and oriented Poor appetite Respiratory status improved 4 L [place]  Objective:  Vital signs in last 24 hours:  Temp:  [97.4 F (36.3 C)-98.4 F (36.9 C)] 97.6 F (36.4 C) (01/12 0754) Pulse Rate:  [55-110] 110 (01/12 0754) Resp:  [14-19] 14 (01/12 0809) BP: (109-136)/(59-83) 109/70 (01/12 0754) SpO2:  [86 %-100 %] 100 % (01/12 0809) Weight:  [88.5 kg] 88.5 kg (01/12 0605)  Weight change: 0.1 kg Filed Weights   09/02/22 0727 09/05/22 0429 09/06/22 0605  Weight: 83.3 kg 88.4 kg 88.5 kg    Intake/Output: I/O last 3 completed shifts: In: 100 [IV Piggyback:100] Out: 1800 [Urine:1800]   Intake/Output this shift:  Total I/O In: 240 [P.O.:240] Out: -   Physical Exam: General: NAD  Head: Normocephalic, atraumatic.   Eyes: Anicteric  Lungs:  Fine crackles, normal effort, 4L [place]  Heart: Regular rate and rhythm  Abdomen:  Soft, nontender, nondistended  Extremities: 2+ peripheral edema.  Neurologic: Alert and oriented, moving all four extremities  Skin: No lesions  Access: None    Basic Metabolic Panel: Recent Labs  Lab 09/02/22 0520 09/03/22 0642 09/04/22 0628 09/05/22 0344 09/06/22 0346  NA 135 135 134* 135 134*  K 4.2 4.0 4.5 4.3 4.8  CL 96* 97* 97* 98 99  CO2 29 30 27 26 23   GLUCOSE 166* 181* 175* 124* 178*  BUN 55* 65* 74* 73* 67*  CREATININE 1.92* 2.20* 2.17* 2.09* 1.80*  CALCIUM 8.6* 8.5* 8.7* 8.5* 8.7*  MG 2.1 2.3 2.5* 2.5* 2.6*    Liver Function Tests: No results for input(s): "AST", "ALT", "ALKPHOS", "BILITOT", "PROT", "ALBUMIN" in the last 168 hours. No results for input(s): "LIPASE", "AMYLASE" in the last 168 hours. No results for input(s): "AMMONIA" in the last 168 hours.  CBC: Recent Labs  Lab 09/01/22 0621 09/02/22 0520 09/03/22 0642 09/04/22 0628 09/05/22 0344  WBC 8.0 7.8 6.9 8.0 7.7  HGB 9.7* 10.1* 9.4* 10.3* 10.2*  HCT  32.4* 33.2* 31.3* 34.2* 33.1*  MCV 96.7 95.7 96.9 96.6 95.1  PLT 187 206 202 243 245    Cardiac Enzymes: No results for input(s): "CKTOTAL", "CKMB", "CKMBINDEX", "TROPONINI" in the last 168 hours.  BNP: Invalid input(s): "POCBNP"  CBG: Recent Labs  Lab 09/05/22 1208 09/05/22 1700 09/05/22 2121 09/06/22 0804 09/06/22 1238  GLUCAP 112* 151* 132* 174* 195*    Microbiology: Results for orders placed or performed during the hospital encounter of 08/22/22  Blood culture (routine x 2)     Status: None   Collection Time: 08/22/22  4:20 PM   Specimen: BLOOD  Result Value Ref Range Status   Specimen Description BLOOD BLOOD RIGHT WRIST  Final   Special Requests   Final    BOTTLES DRAWN AEROBIC AND ANAEROBIC Blood Culture adequate volume   Culture   Final    NO GROWTH 5 DAYS Performed at [facility], [address] [place], [place]    Report Status 08/27/2022 FINAL  Final  Blood culture (routine x 2)     Status: None   Collection Time: 08/22/22  4:20 PM   Specimen: BLOOD  Result Value Ref Range Status   Specimen Description BLOOD BLOOD RIGHT FOREARM  Final   Special Requests   Final    BOTTLES DRAWN AEROBIC AND ANAEROBIC Blood Culture adequate volume   Culture   Final    NO  GROWTH 5 DAYS Performed at [facility], [address] [place], [place] [address]    Report Status 08/27/2022 FINAL  Final  Resp panel by RT-PCR (RSV, Flu A&B, Covid)     Status: Abnormal   Collection Time: 09/02/22  4:51 PM  Result Value Ref Range Status   SARS Coronavirus 2 by RT PCR NEGATIVE NEGATIVE Final    Comment: (NOTE) SARS-CoV-2 target nucleic acids are NOT DETECTED.  The SARS-CoV-2 RNA is generally detectable in upper respiratory specimens during the acute phase of infection. The lowest concentration of SARS-CoV-2 viral copies this assay can detect is 138 copies/mL. A negative result does not preclude SARS-Cov-2 infection and should not be used as the sole  basis for treatment or other patient management decisions. A negative result may occur with  improper specimen collection/handling, submission of specimen other than nasopharyngeal swab, presence of viral mutation(s) within the areas targeted by this assay, and inadequate number of viral copies(<138 copies/mL). A negative result must be combined with clinical observations, patient history, and epidemiological information. The expected result is Negative.  Fact Sheet for Patients:  EntrepreneurPulse.com.au  Fact Sheet for Healthcare Providers:  IncredibleEmployment.be  This test is no t yet approved or cleared by the [place] FDA and  has been authorized for detection and/or diagnosis of SARS-CoV-2 by FDA under an Emergency Use Authorization (EUA). This EUA will remain  in effect (meaning this test can be used) for the duration of the COVID-19 declaration under Section 564(b)(1) of the Act, 21 U.S.C.section 360bbb-3(b)(1), unless the authorization is terminated  or revoked sooner.       Influenza A by PCR NEGATIVE NEGATIVE Final   Influenza B by PCR NEGATIVE NEGATIVE Final    Comment: (NOTE) The Xpert Xpress SARS-CoV-2/FLU/RSV plus assay is intended as an aid in the diagnosis of influenza from Nasopharyngeal swab specimens and should not be used as a sole basis for treatment. Nasal washings and aspirates are unacceptable for Xpert Xpress SARS-CoV-2/FLU/RSV testing.  Fact Sheet for Patients: EntrepreneurPulse.com.au  Fact Sheet for Healthcare Providers: IncredibleEmployment.be  This test is not yet approved or cleared by the [place] FDA and has been authorized for detection and/or diagnosis of SARS-CoV-2 by FDA under an Emergency Use Authorization (EUA). This EUA will remain in effect (meaning this test can be used) for the duration of the COVID-19 declaration under Section 564(b)(1) of the Act,  21 U.S.C. section 360bbb-3(b)(1), unless the authorization is terminated or revoked.     Resp Syncytial Virus by PCR POSITIVE (A) NEGATIVE Final    Comment: (NOTE) Fact Sheet for Patients: EntrepreneurPulse.com.au  Fact Sheet for Healthcare Providers: IncredibleEmployment.be  This test is not yet approved or cleared by the [place] FDA and has been authorized for detection and/or diagnosis of SARS-CoV-2 by FDA under an Emergency Use Authorization (EUA). This EUA will remain in effect (meaning this test can be used) for the duration of the COVID-19 declaration under Section 564(b)(1) of the Act, 21 U.S.C. section 360bbb-3(b)(1), unless the authorization is terminated or revoked.  Performed at [facility], [address] [place], [place] [address]     Coagulation Studies: No results for input(s): "LABPROT", "INR" in the last 72 hours.  Urinalysis: No results for input(s): "COLORURINE", "LABSPEC", "PHURINE", "GLUCOSEU", "HGBUR", "BILIRUBINUR", "KETONESUR", "PROTEINUR", "UROBILINOGEN", "NITRITE", "LEUKOCYTESUR" in the last 72 hours.  Invalid input(s): "APPERANCEUR"    Imaging: DG Chest Port 1 View  Result Date: 09/06/2022 CLINICAL DATA:  Chest pain. EXAM: PORTABLE CHEST 1  VIEW COMPARISON:  09/05/2022 FINDINGS: Stable heart size status post prior CABG. Increase in pulmonary interstitial edema and bilateral pleural effusions, right greater than left. Associated bibasilar atelectasis. No pneumothorax or focal airspace consolidation. IMPRESSION: Increase in pulmonary interstitial edema and bilateral pleural effusions, right greater than left. Electronically Signed   By: Irish Lack M.D.   On: 09/06/2022 08:52   DG Chest 2 View  Result Date: 09/05/2022 CLINICAL DATA:  Possible pneumonia EXAM: CHEST - 2 VIEW COMPARISON:  Chest x-ray dated September 02, 2022 FINDINGS: Unchanged cardiomegaly. Prior median sternotomy and CABG. Similar  interstitial opacities and small bilateral pleural effusions. Lower lung predominant linear opacities are likely due to atelectasis. No evidence of pneumothorax. IMPRESSION: Similar interstitial opacities and small bilateral pleural effusions, likely due to pulmonary edema. Electronically Signed   By: Allegra Lai M.D.   On: 09/05/2022 17:55     Medications:    sodium chloride Stopped (08/24/22 1617)   sodium chloride     cefTRIAXone (ROCEPHIN)  IV 1 g (09/06/22 1315)    amiodarone  100 mg Oral Daily   apixaban  2.5 mg Oral BID   aspirin EC  81 mg Oral Daily   atorvastatin  80 mg Oral QHS   brinzolamide  1 drop Both Eyes TID   And   brimonidine  1 drop Both Eyes TID   Chlorhexidine Gluconate Cloth  6 each Topical Daily   collagenase   Topical Daily   cyanocobalamin  1,000 mcg Oral Daily   doxycycline  100 mg Oral Q12H   furosemide  40 mg Intravenous Daily   insulin aspart  0-5 Units Subcutaneous QHS   insulin aspart  0-9 Units Subcutaneous TID WC   insulin aspart  3 Units Subcutaneous TID WC   isosorbide mononitrate  30 mg Oral Daily   methylPREDNISolone (SOLU-MEDROL) injection  80 mg Intravenous Q24H   metoprolol succinate  50 mg Oral Daily   mometasone-formoterol  2 puff Inhalation BID   pantoprazole  40 mg Oral Daily   polyethylene glycol  17 g Oral BID   sacubitril-valsartan  1 tablet Oral BID   timolol  1 drop Both Eyes QHS   Travoprost (BAK Free)  1 drop Both Eyes QHS   sodium chloride, sodium chloride, acetaminophen, albuterol, dextromethorphan-guaiFENesin, ipratropium-albuterol, nitroGLYCERIN, ondansetron (ZOFRAN) IV, oxyCODONE, sodium chloride flush  Assessment/ Plan:  [name] is a 82 y.o.  male with past medical conditions of hyperlipidemia, hypertension, chronic systolic heart failure with EF 40 to 45%, PVD, and chronic kidney disease stage III a, who was admitted to [facility] on 08/22/2022 for Acute on chronic combined systolic and diastolic CHF (congestive  heart failure) (HCC) [I50.43] Cellulitis of lower extremity, unspecified laterality [L03.119] Acute on chronic congestive heart failure, unspecified heart failure type (HCC) [I50.9]    Acute kidney injury on chronic kidney disease stage III A.  Baseline GFR appears to be between 35 and 37.  Current GFR 31, has been as low as 29.    Renal function has returned to baseline.  Adequate urine output noted.  Agree with IV furosemide 40 mg daily.  Lab Results  Component Value Date   CREATININE 1.80 (H) 09/06/2022   CREATININE 2.09 (H) 09/05/2022   CREATININE 2.17 (H) 09/04/2022    Intake/Output Summary (Last 24 hours) at 09/06/2022 1344 Last data filed at 09/06/2022 1000 Gross per 24 hour  Intake 340 ml  Output 500 ml  Net -160 ml   2. .  Acute on  chronic systolic heart failure.  Echo completed on 08/28/2022 shows EF 40 to 45% with grade 2 diastolic dysfunction.  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  Also notes aortic calcification with stenosis.  Cardiology following.  Diuretics restarted as above    LOS: [place] 1/12/20241:44 PM

## 2022-09-06 NOTE — Progress Notes (Signed)
CROSS COVER NOTE  NAME: Bradley [name] MRN: [id number] DOB : [birth year]    HPI/Events of Note   Patient with episode of chest pain 7/10 left chest without relief after 3 SL nitro and morphine  Assessment and  Interventions   Assessment: EKG wide QRS rhythm which is new - no acute STE - QTC now at 502 VSS  Troponin 60 Plan: Azithromycin discontinued - doxy started Message sent to cardiology regarding wide comlex rhythm - suggest holding amio dose this am Chest xray ordered  Likely fluid overload component of chest pain      Kathlene Cote NP [facility]

## 2022-09-06 NOTE — Progress Notes (Deleted)
   Patient ID: Bradley [name], male    DOB: 1941-01-06, 82 y.o.   MRN: [id number]  HPI  Bradley [name] is a 82 y/o male with a history of  Echo   Review of Systems    Physical Exam

## 2022-09-06 NOTE — Progress Notes (Signed)
PT Cancellation Note  Patient Details Name: Bradley [name] MRN: [id number] DOB: 25-Sep-1940   Cancelled Treatment:    Reason Eval/Treat Not Completed: Fatigue/lethargy limiting ability to participate;Patient's level of consciousness (Pt asleep upon therapy attempt. Will attempt again at later date/time.)  4:31 PM, 09/06/22 Bradley [name], PT, DPT Physical Therapist - [facility]  [phone] ([place])    [name] 09/06/2022, 4:31 PM

## 2022-09-06 NOTE — Progress Notes (Addendum)
Progress Note   Patient: Bradley [name] UUV:[id number] DOB: 1941-07-03 DOA: 08/22/2022     15 DOS: the patient was seen and examined on 09/06/2022   Brief hospital course: Akshath Mccarey is a 82 y.o. 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 Patient stated that he has chronic shortness of breath, which has been progressively worsening in the past several weeks, much worse day of arrival to ED, assoc w/ bilateral leg edema and legs oozing fluid. According to the patient he was sent from the [place] to our hospital with concerns for leg cellulitis. 12/28: pt was found to have BNP > 4500,  WBC 11.1, potassium 5.7, worsening renal function w/ Cr 2.84, VSS 121/54, oxygen saturation 100% on room air.  Chest x-ray showed low volume without infiltration.  Patient was admitted to telemetry bed as inpatient for tx CHF 12/30: Debridement R foot wound w/ podiatry. Vascular surgery planning angiography pending improvementin renal function. 12/31: renal function about same Cr 1.99. Net IO Since Admission: -4,208.77 mL [08/25/22 1516] 01/01: renal fxn improving to Cr 1.78. Angiography planned for tomorrow  01/02: LE angiography w/ stent to R external iliac artery.  1/11.  Patient was found to have RSV positive on 1/8.  Renal function was worse after IV Lasix, but has been better gradually since discontinuation of IV Lasix 3 days ago.       Patient became more short of breath today with some wheezing.  Started steroids and antibiotics.       Assessment and Plan:  RSV infection. Patient developed short of breath and wheezing.  He also has a cough, with a white mucus.  He has a mild elevation of procalcitonin level.  He also has some bronchospasm.  I am concerned that patient may develop secondary bacterial pneumonia.  Will start antibiotics  with Rocephin and Zithromax. Also added Solu-Medrol for bronchospasm.  Continue bronchodilator. Chest x-ray showed vascular congestion.   Acute on chronic combined systolic and diastolic CHF (congestive heart failure) (HCC) Moderate pulmonary hypertension Elevated troponin secondary to chf.  2D echo on 03/12/2022 showed EF of 40-45% with grade 2 diastolic dysfunction.  BNP> 4500. --has been diuresed with IV lasix 80 BID per cardio Diuretics on hold due to worsening renal function, currently still on Entresto and metoprolol. Chest x-ray today showed increased pulmonary edema, cardiology has started IV Lasix today.  Will follow closely.   Acute renal failure superimposed on stage 3a chronic kidney disease ([place]):  Hyperkalemia. Baseline creatinine 1.38 on 03/14/2022.   On Admission, Cr 2.84 Currently much better today.  Appreciate nephrology consult.  Will follow renal function while patient receiving lower dose diuretics. Potassium has normalized.   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 --Podiatry --> saw patient 12/30 and performed excisional debridement of the ulcerative areas on the right forefoot.  Plan: --cont dressing change per order   PVD (peripheral vascular disease) ([place]) S/p LE angiography w/ stent to R external iliac artery on 08/28/22 --pt has improved perfusion after the stent placement. Continue statin and aspirin.   NSTEMI CAD (coronary artery disease status post CABG and prior PCI --underwent coronary angiography at [place] less than a month ago, showing stable findings of severe native CAD, patent LIMA-LAD and SVG-OM, and patent  SVG-R PLA with severe disease at the distal anastomosis that could could not be intervened upon previously at [facility].  --chest pain morning of 1/3 with increasing trop from 72 to 2572, trending down since. Completed course  of heparin drip, continue aspirin and statin.  Followed by cardiology.   Moderate aortic stenosis Recent echo done showed moderate aortic stenosis at [facility] TAVR is not indicated at this time and cardiology is following   Type II diabetes mellitus with renal manifestations [facility]):  Recent A1c 7.8.   Poorly controlled.   Continue current insulin regimen, resume home regimen at discharge.   Paroxysmal atrial fibrillation (HCC) s/p Cardioversion Amiodarone 100 mg po daily and Metoprolol Succinate 50 mg po Daily --cont Eliquis   Normocytic Anemia --No overt bleeding noted monitor CBC         Subjective:  Patient developed more short of breath this morning, oxygen saturation dropped down to 86%, placed on 4 L oxygen.  Physical Exam: Vitals:   09/06/22 0543 09/06/22 0605 09/06/22 0754 09/06/22 0809  BP: 131/79  109/70   Pulse: 100  (!) 110   Resp:   18 14  Temp:   97.6 F (36.4 C)   TempSrc:      SpO2:   (!) 86% 100%  Weight:  88.5 kg    Height:       General exam: Appears calm and comfortable  Respiratory system: Crackles in the left lower lobe. Respiratory effort normal. Cardiovascular system: S1 & S2 heard, RRR. No JVD, murmurs, rubs, gallops or clicks. No pedal edema. Gastrointestinal system: Abdomen is nondistended, soft and nontender. No organomegaly or masses felt. Normal bowel sounds heard. Central nervous system: Alert and oriented. No focal neurological deficits. Extremities: Symmetric 5 x 5 power. Skin: No rashes, lesions or ulcers Psychiatry: Judgement and insight appear normal. Mood & affect appropriate. ' Data Reviewed:  Lab results reviewed.  Family Communication: Talked with patient niece yesterday evening over the phone, updated her.  Disposition: Status is: Inpatient Remains inpatient appropriate because: Severity of disease, IV treatment.  Planned Discharge Destination: Home with Home Health    Time spent: 35 minutes  Author: Sharen Hones,  MD 09/06/2022 1:08 PM  For on call review [email or web address].

## 2022-09-06 NOTE — Plan of Care (Signed)
  Problem: Activity: ?Goal: Capacity to carry out activities will improve ?Outcome: Progressing ?  ?

## 2022-09-06 NOTE — Progress Notes (Addendum)
Patient stated he was having chest pain in center of chest radiating to the left. BP of 132/83 HR 97. Nitro x3 given with only pain decrease to 7. Sharion Settler NP notified. EKG obtained x2, morphine administered, chest pain still at 7. Continuing to monitor at bedside.

## 2022-09-06 NOTE — Progress Notes (Signed)
Rounding Note    Patient Name: Bradley [name] Date of Encounter: 09/06/2022  [place] Cardiologist: Ida Rogue, MD   Subjective   The patient had sharp chest pain overnight, not relieved with SL NTG. HS trop 60>54. CXR showed increase in pulmonary interstitial edema and bilateral effusions, R>L. Kidney function is stable.   Inpatient Medications    Scheduled Meds:  amiodarone  100 mg Oral Daily   apixaban  2.5 mg Oral BID   aspirin EC  81 mg Oral Daily   atorvastatin  80 mg Oral QHS   brinzolamide  1 drop Both Eyes TID   And   brimonidine  1 drop Both Eyes TID   Chlorhexidine Gluconate Cloth  6 each Topical Daily   collagenase   Topical Daily   cyanocobalamin  1,000 mcg Oral Daily   doxycycline  100 mg Oral Q12H   insulin aspart  0-5 Units Subcutaneous QHS   insulin aspart  0-9 Units Subcutaneous TID WC   insulin aspart  3 Units Subcutaneous TID WC   isosorbide mononitrate  30 mg Oral Daily   methylPREDNISolone (SOLU-MEDROL) injection  80 mg Intravenous Q24H   metoprolol succinate  50 mg Oral Daily   mometasone-formoterol  2 puff Inhalation BID   pantoprazole  40 mg Oral Daily   polyethylene glycol  17 g Oral BID   sacubitril-valsartan  1 tablet Oral BID   timolol  1 drop Both Eyes QHS   Travoprost (BAK Free)  1 drop Both Eyes QHS   Continuous Infusions:  sodium chloride Stopped (08/24/22 1617)   sodium chloride     cefTRIAXone (ROCEPHIN)  IV Stopped (09/05/22 1723)   PRN Meds: sodium chloride, sodium chloride, acetaminophen, albuterol, dextromethorphan-guaiFENesin, ipratropium-albuterol, nitroGLYCERIN, ondansetron (ZOFRAN) IV, oxyCODONE, sodium chloride flush   Vital Signs    Vitals:   09/06/22 0543 09/06/22 0605 09/06/22 0754 09/06/22 0809  BP: 131/79  109/70   Pulse: 100  (!) 110   Resp:   18 14  Temp:   97.6 F (36.4 C)   TempSrc:      SpO2:   (!) 86% 100%  Weight:  88.5 kg    Height:        Intake/Output Summary (Last 24 hours) at  09/06/2022 0920 Last data filed at 09/06/2022 0500 Gross per 24 hour  Intake 100 ml  Output 1300 ml  Net -1200 ml      09/06/2022    6:05 AM 09/05/2022    4:29 AM 09/02/2022    7:27 AM  Last 3 Weights  Weight (lbs) 195 lb 1.7 oz 194 lb 14.2 oz 183 lb 11.2 oz  Weight (kg) 88.5 kg 88.4 kg 83.326 kg       ECG    NSR with first degree AV block, widening QRS/LBBB, non specific ST/T wave changes - Personally Reviewed  Physical Exam   GEN: No acute distress.   Neck: No JVD Cardiac: RRR, no murmurs, rubs, or gallops.  Respiratory: Clear to auscultation bilaterally. GI: Soft, nontender, non-distended  MS: No edema; No deformity. Neuro:  Nonfocal  Psych: Normal affect   Labs    High Sensitivity Troponin:   Recent Labs  Lab 08/28/22 1426 08/28/22 2138 08/29/22 0518 09/06/22 0608 09/06/22 0757  TROPONINIHS 2,572* 2,172* 1,216* 60* 54*     Chemistry Recent Labs  Lab 09/04/22 0628 09/05/22 0344 09/06/22 0346  NA 134* 135 134*  K 4.5 4.3 4.8  CL 97* 98 99  CO2 27 26  23  GLUCOSE 175* 124* 178*  BUN 74* 73* 67*  CREATININE 2.17* 2.09* 1.80*  CALCIUM 8.7* 8.5* 8.7*  MG 2.5* 2.5* 2.6*  GFRNONAA 30* 31* 37*  ANIONGAP 10 11 12     Lipids No results for input(s): "CHOL", "TRIG", "HDL", "LABVLDL", "LDLCALC", "CHOLHDL" in the last 168 hours.  Hematology Recent Labs  Lab 09/03/22 11/02/22 09/04/22 0628 09/05/22 0344  WBC 6.9 8.0 7.7  RBC 3.23* 3.54* 3.48*  HGB 9.4* 10.3* 10.2*  HCT 31.3* 34.2* 33.1*  MCV 96.9 96.6 95.1  MCH 29.1 29.1 29.3  MCHC 30.0 30.1 30.8  RDW 22.0* 22.2* 22.1*  PLT 202 243 245   Thyroid No results for input(s): "TSH", "FREET4" in the last 168 hours.  BNP Recent Labs  Lab 09/01/22 0616 09/05/22 0344  BNP 3,682.6* 3,373.6*    DDimer No results for input(s): "DDIMER" in the last 168 hours.   Radiology    DG Chest Port 1 View  Result Date: 09/06/2022 CLINICAL DATA:  Chest pain. EXAM: PORTABLE CHEST 1 VIEW COMPARISON:  09/05/2022 FINDINGS:  Stable heart size status post prior CABG. Increase in pulmonary interstitial edema and bilateral pleural effusions, right greater than left. Associated bibasilar atelectasis. No pneumothorax or focal airspace consolidation. IMPRESSION: Increase in pulmonary interstitial edema and bilateral pleural effusions, right greater than left. Electronically Signed   By: 11/04/2022 M.D.   On: 09/06/2022 08:52   DG Chest 2 View  Result Date: 09/05/2022 CLINICAL DATA:  Possible pneumonia EXAM: CHEST - 2 VIEW COMPARISON:  Chest x-ray dated September 02, 2022 FINDINGS: Unchanged cardiomegaly. Prior median sternotomy and CABG. Similar interstitial opacities and small bilateral pleural effusions. Lower lung predominant linear opacities are likely due to atelectasis. No evidence of pneumothorax. IMPRESSION: Similar interstitial opacities and small bilateral pleural effusions, likely due to pulmonary edema. Electronically Signed   By: September 04, 2022 M.D.   On: 09/05/2022 17:55    Cardiac Studies   Echo cardiogram 08/28/22 Left ventricular ejection fraction, by estimation, is 40 to 45%. The  left ventricle has mildly decreased function. The left ventricle  demonstrates global hypokinesis with severe hypokinesis of the bassal  anterior and anteroseptal wall, and inferior  wall. Left ventricular diastolic parameters are consistent with Grade II  diastolic dysfunction (pseudonormalization). There is the interventricular  septum is flattened in systole and diastole, consistent with right  ventricular pressure and volume  overload.   2. Right ventricular systolic function is normal. The right ventricular  size is normal. There is moderately elevated pulmonary artery systolic  pressure. The estimated right ventricular systolic pressure is 49.9 mmHg.   3. Left atrial size was mildly dilated.   4. Right atrial size was mildly dilated.   5. The mitral valve is normal in structure. Mild to moderate mitral valve   regurgitation. No evidence of mitral stenosis.   6. Tricuspid valve regurgitation is mild to moderate.   7. The aortic valve is tricuspid. There is moderate calcification of the  aortic valve. Aortic valve regurgitation is mild to moderate. Mild aortic  valve stenosis by Aortic valve mean gradient measures 14.3 mmHg. Aortic  valve Vmax measures 2.54  m/s.Moderate stenosis by Aortic valve area, by VTI measures 0.89 cm.   8. The inferior vena cava is normal in size with greater than 50%  respiratory variability, suggesting right atrial pressure of 3 mmHg.   Echo 02/2022 1. Left ventricular ejection fraction, by estimation, is 40 to 45%. The  left ventricle has mildly decreased function.  The left ventricle  demonstrates regional wall motion abnormalities (see scoring  diagram/findings for description). There is moderate  left ventricular hypertrophy. Left ventricular diastolic parameters are  consistent with Grade II diastolic dysfunction (pseudonormalization).  Elevated left atrial pressure. There is severe hypokinesis of the left  ventricular, basal-mid inferoseptal wall  and inferior wall.   2. Right ventricular systolic function is mildly reduced. The right  ventricular size is normal.   3. Left atrial size was mildly dilated.   4. Right atrial size was mildly dilated.   5. The mitral valve is degenerative. Mild mitral valve regurgitation.   6. The aortic valve is tricuspid. There is moderate calcification of the  aortic valve. There is moderate thickening of the aortic valve. Aortic  valve regurgitation is mild. 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  While the mean gradient is only mildly   elevated, the abnormal valve area and dimensionless index calculations  suggest at least moderate to severe low-flow:low-gradient aortic stenosis.  Visual assessment of leaflet movement suggests that aortic stenosis is not  critical.   7. The inferior vena cava is normal in size with <50%  respiratory  variability, suggesting right atrial pressure of 8 mmHg.    Cardiac cath 06/2021 Conclusions: Severe native coronary artery disease with 90% distal LMCA stenosis, diffuse proximal LAD disease with functional mid vessel occlusion with competitive flow from LIMA-LAD, 99% proximal OM1 stenosis, and CTO of proximal RCA. Widely patent LIMA-LAD and SVG-OM3. Patent SVG-RPDA with 70% stenosis near distal anastomosis.  RPDA distal to SVG anastomosis is occluded (? culprit lesion for NSTEMI). Mildly elevated left ventricular filling pressure (LVEDP 15-20 mmHg). Mild-moderate aortic valve stenosis (peak-to-peak gradient ~20 mmHg).   Recommendations: Medical therapy.  Would only consider PCI to distal anastomosis of SVG-RPDA for refractory symptoms. Load with clopidogrel 300 mg daily followed by 75 mg daily thereafter for 12 months. Aggressive secondary prevention and initiation of goal-directed medical therapy for acute systolic heart failure.   Yvonne Kendall, MD [facility]      Patient Profile     82 y.o. 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   Assessment & Plan    Chest pain CAD s/p CABG - patient with atypical chest pain overnight, appears to be MSK pain. HS trop minimally elevated 60>54 and EKG essentially unchanged - this AM he has some pain with movement and is tender with palpation - patient initially presented with chest pain and trop peak of 2500 started with IV heparin - Cardiac cath 1 month ago at an outside facility showed stable severe 3V CAD, patent grafts - continue medical management with Aspirin, Lipitor, Imdur and metoprolol  Acute on chronic combined  systolic and diastolic CHF - Echo showed LVEF 40-45%, G2DD - BNP >4500 - IV lasix 80mg  BID - diuretics held for worsening Scr - Jardiance and spiro held for worsening Scr - continue Entresto 24-26mg BID - UOP -12.5L - Kidney function improving>started on IV lasix 40mg  daily  Acute on chronic respiratory distress RSV - per IM  Aortic Valve stenosis - recent evaluation at [facility] with dobutamine echo - moderate AS - no plan for TAVR at this time  PAD with critical limb ischemia - s/p right iliac PTA and stenting this admission - residual SFA  disease with plan for outpatient follow-up and consideration for repeat procedure in the future - continue ASA and statin  Paroxysmal Afib - maintaining NSR - CHADSVASC at least 6 - continue Eliquis 2.5mg  BID  - amiodarone 100mg  daily  Acute on chronic CKD stage 3 - Scr up to 2.84 on admission  - Scr 1.8/BUN 67 - nephrology is following   For questions or updates, please contact [place] Please consult [email or web address] for contact info under     Signed, Gagandeep Kossman Ninfa Meeker, PA-C  09/06/2022, 9:20 AM

## 2022-09-07 ENCOUNTER — Inpatient Hospital Stay
Admit: 2022-09-07 | Discharge: 2022-09-07 | Disposition: A | Discharge status: Home / Self Care | Payer: No Typology Code available for payment source | Attending: Cardiovascular Disease

## 2022-09-07 DIAGNOSIS — I214 Non-ST elevation (NSTEMI) myocardial infarction: Secondary | ICD-10-CM | POA: Diagnosis not present

## 2022-09-07 DIAGNOSIS — I5043 Acute on chronic combined systolic (congestive) and diastolic (congestive) heart failure: Secondary | ICD-10-CM | POA: Diagnosis not present

## 2022-09-07 DIAGNOSIS — I480 Paroxysmal atrial fibrillation: Secondary | ICD-10-CM | POA: Diagnosis not present

## 2022-09-07 DIAGNOSIS — N170 Acute kidney failure with tubular necrosis: Secondary | ICD-10-CM | POA: Diagnosis not present

## 2022-09-07 DIAGNOSIS — E663 Overweight: Secondary | ICD-10-CM | POA: Insufficient documentation

## 2022-09-07 DIAGNOSIS — I5021 Acute systolic (congestive) heart failure: Secondary | ICD-10-CM

## 2022-09-07 DIAGNOSIS — N1831 Chronic kidney disease, stage 3a: Secondary | ICD-10-CM | POA: Diagnosis not present

## 2022-09-07 DIAGNOSIS — I272 Pulmonary hypertension, unspecified: Secondary | ICD-10-CM

## 2022-09-07 LAB — BASIC METABOLIC PANEL
Anion gap: 12 (ref 5–15)
BUN: 89 mg/dL — ABNORMAL HIGH (ref 8–23)
CO2: 23 mmol/L (ref 22–32)
Calcium: 8.7 mg/dL — ABNORMAL LOW (ref 8.9–10.3)
Chloride: 96 mmol/L — ABNORMAL LOW (ref 98–111)
Creatinine, Ser: 2.76 mg/dL — ABNORMAL HIGH (ref 0.61–1.24)
GFR, Estimated: 22 mL/min — ABNORMAL LOW (ref >60)
Glucose, Bld: 223 mg/dL — ABNORMAL HIGH (ref 70–99)
Potassium: 5.3 mmol/L — ABNORMAL HIGH (ref 3.5–5.1)
Sodium: 131 mmol/L — ABNORMAL LOW (ref 135–145)

## 2022-09-07 LAB — CBC
HCT: 34.8 % — ABNORMAL LOW (ref 39.0–52.0)
Hemoglobin: 10.8 g/dL — ABNORMAL LOW (ref 13.0–17.0)
MCH: 29.8 pg (ref 26.0–34.0)
MCHC: 31 g/dL (ref 30.0–36.0)
MCV: 95.9 fL (ref 80.0–100.0)
Platelets: 289 10*3/uL (ref 150–400)
RBC: 3.63 MIL/uL — ABNORMAL LOW (ref 4.22–5.81)
RDW: 22 % — ABNORMAL HIGH (ref 11.5–15.5)
WBC: 8.6 10*3/uL (ref 4.0–10.5)
nRBC: 0 % (ref 0.0–0.2)

## 2022-09-07 LAB — ECHOCARDIOGRAM LIMITED
Calc EF: 45.7 %
Height: 72 in
S' Lateral: 4.25 cm
Single Plane A2C EF: 46.5 %
Single Plane A4C EF: 43.8 %
Weight: 3111.13 oz

## 2022-09-07 LAB — GLUCOSE, CAPILLARY
Glucose-Capillary: 212 mg/dL — ABNORMAL HIGH (ref 70–99)
Glucose-Capillary: 347 mg/dL — ABNORMAL HIGH (ref 70–99)
Glucose-Capillary: 258 mg/dL — ABNORMAL HIGH (ref 70–99)
Glucose-Capillary: 228 mg/dL — ABNORMAL HIGH (ref 70–99)

## 2022-09-07 LAB — MAGNESIUM: Magnesium: 2.6 mg/dL — ABNORMAL HIGH (ref 1.7–2.4)

## 2022-09-07 MED ORDER — HYDRALAZINE HCL 10 MG PO TABS
10.0000 mg | ORAL_TABLET | Freq: Three times a day (TID) | ORAL | Status: DC
  Administered 2022-09-07 – 2022-09-16 (×21): 10 mg via ORAL
  Filled 2022-09-07 (×24): qty 1

## 2022-09-07 MED ORDER — PREDNISONE 20 MG PO TABS
40.0000 mg | ORAL_TABLET | Freq: Every day | ORAL | Status: DC
  Administered 2022-09-08: 40 mg via ORAL
  Filled 2022-09-07 (×2): qty 2

## 2022-09-07 MED ORDER — ALBUMIN HUMAN 25 % IV SOLN
12.5000 g | Freq: Once | INTRAVENOUS | Status: AC
  Administered 2022-09-07: 12.5 g via INTRAVENOUS
  Filled 2022-09-07: qty 50

## 2022-09-07 MED ORDER — SODIUM ZIRCONIUM CYCLOSILICATE 5 G PO PACK
5.0000 g | PACK | Freq: Once | ORAL | Status: AC
  Administered 2022-09-07: 5 g via ORAL
  Filled 2022-09-07: qty 1

## 2022-09-07 MED ORDER — PERFLUTREN LIPID MICROSPHERE
1.0000 mL | INTRAVENOUS | Status: AC | PRN
  Administered 2022-09-07: 5 mL via INTRAVENOUS

## 2022-09-07 NOTE — Progress Notes (Signed)
Progress Note   Patient: Bradley [name] [id number]:[id number] DOB: [birth year] DOA: 08/22/2022     16 DOS: the patient was seen and examined on 09/07/2022   Brief hospital course: Olivier Frayre is a 82 y.o. 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 Patient stated that he has chronic shortness of breath, which has been progressively worsening in the past several weeks, much worse day of arrival to ED, assoc w/ bilateral leg edema and legs oozing fluid. According to the patient he was sent from the [place] to our hospital with concerns for leg cellulitis. 12/28: pt was found to have BNP > 4500,  WBC 11.1, potassium 5.7, worsening renal function w/ Cr 2.84, VSS 121/54, oxygen saturation 100% on room air.  Chest x-ray showed low volume without infiltration.  Patient was admitted to telemetry bed as inpatient for tx CHF 12/30: Debridement R foot wound w/ podiatry. Vascular surgery planning angiography pending improvementin renal function. 12/31: renal function about same Cr 1.99. Net IO Since Admission: -4,208.77 mL [08/25/22 1516] 01/01: renal fxn improving to Cr 1.78. Angiography planned for tomorrow  01/02: LE angiography w/ stent to R external iliac artery.  1/11.  Patient was found to have RSV positive on 1/8.  Renal function was worse after IV Lasix, but has been better gradually since discontinuation of IV Lasix 3 days ago.       Patient became more short of breath today with some wheezing.  Started steroids and antibiotics.       Assessment and Plan:  RSV infection. Patient developed short of breath and wheezing.  He also has a cough, with a white mucus.  He has a mild elevation of procalcitonin level.  He also has some bronchospasm.  I am concerned that patient may develop secondary bacterial pneumonia.  Will start antibiotics  with Rocephin and Zithromax. Also added Solu-Medrol for bronchospasm.  Continue bronchodilator. Chest x-ray showed vascular congestion.   Acute on chronic combined systolic and diastolic CHF (congestive heart failure) (HCC) Moderate pulmonary hypertension Elevated troponin secondary to chf.  2D echo on 03/12/2022 showed EF of 40-45% with grade 2 diastolic dysfunction.  BNP> 4500. --has been diuresed with IV lasix 80 BID per cardio Diuretics on hold due to worsening renal function, currently still on Entresto and metoprolol. Chest x-ray today showed increased pulmonary edema, received 40 mg IV Lasix yesterday.  Renal function much worse today.  Hold off on diuretics.  Discussed with cardiology, they were planning for heart cath on Monday.  acute renal failure superimposed on stage 3a chronic kidney disease ([place]):  Hyperkalemia. Baseline creatinine 1.38 on 03/14/2022.   On Admission, Cr 2.84 Renal function much worse again after giving IV Lasix.  Give 25 g of albumin.  Seen by cardiology and nephrology.   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 --Podiatry --> saw patient 12/30 and performed excisional debridement of the ulcerative areas on the right forefoot.  Plan: --cont dressing change per order   PVD (peripheral vascular disease) ([place]) S/p LE angiography w/ stent to R external iliac artery on 08/28/22 --pt has improved perfusion after the stent placement. Continue statin and aspirin.   NSTEMI CAD (coronary artery disease status post CABG and prior PCI --underwent coronary angiography at [facility] less  than a month ago, showing stable findings of severe native CAD, patent LIMA-LAD and SVG-OM, and patent SVG-R PLA with severe disease at the distal anastomosis that could could not be intervened upon previously at [facility].  --chest pain morning of 1/3 with increasing trop  from 72 to 2572, trending down since. Completed course of heparin drip, continue aspirin and statin.  Followed by cardiology.   Moderate aortic stenosis Recent echo done showed moderate aortic stenosis at [facility] TAVR is not indicated at this time and cardiology is following   Type II diabetes mellitus with renal manifestations [facility]):  Recent A1c 7.8.   Poorly controlled.   Continue current insulin regimen, resume home regimen at discharge.   Paroxysmal atrial fibrillation (HCC) s/p Cardioversion Amiodarone 100 mg po daily and Metoprolol Succinate 50 mg po Daily --cont Eliquis   Normocytic Anemia --No overt bleeding noted monitor CBC        Subjective:  Patient feels much better with breathing today after getting Lasix yesterday.  Still on 2 L oxygen.  Physical Exam: Vitals:   09/06/22 2018 09/07/22 0411 09/07/22 0427 09/07/22 0745  BP: (!) 97/56 (!) 100/58  119/63  Pulse: 60 (!) 53  (!) 57  Resp: 20 20  12   Temp: 97.6 F (36.4 C) (!) 97.3 F (36.3 C)  (!) 97.5 F (36.4 C)  TempSrc:    Oral  SpO2: 98% 99%  100%  Weight:   88.2 kg   Height:       General exam: Appears calm and comfortable  Respiratory system: Dereased breathing sounds. Respiratory effort normal. Cardiovascular system: S1 & S2 heard, RRR. No JVD, murmurs, rubs, gallops or clicks. No pedal edema. Gastrointestinal system: Abdomen is nondistended, soft and nontender. No organomegaly or masses felt. Normal bowel sounds heard. Central nervous system: Alert and oriented. No focal neurological deficits. Extremities: Symmetric 5 x 5 power. Skin: No rashes, lesions or ulcers Psychiatry: Judgement and insight appear normal. Mood & affect appropriate.   Data Reviewed:  Lab results reviewed.  Family Communication: Updated niece over the phone  Disposition: Status is: Inpatient Remains inpatient appropriate because: Severity of disease, worsening renal function.  Volume overload.  Planned Discharge  Destination:  TBD    Time spent: 35 minutes  Author: Sharen Hones, MD 09/07/2022 11:52 AM  For on call review [email or web address].

## 2022-09-07 NOTE — Progress Notes (Signed)
Physical Therapy Treatment Patient Details Name: Bradley [name] MRN: [id number] DOB: Feb 22, 1941 Today's Date: 09/07/2022   History of Present Illness Bradley [name] is an 50yoM who comes to [facility] on 08/22/22 c CP and LEE. PMH: CHF, CAD, DM, HTN, HLD. Pt underwent vascular surgery 08/27/22 c Dr. Delana Meyer, PTA stent Rt EIA. Cariology following 2/2 elevated troponin and angina in the setting of known 3 vessel disease. Pt admitted Dec 6-15 at [place] back to home alone without any home services, had support from niece, not mobilizing very much due to ongoing pain along Rt heel to lower calf/achilles pain.    PT Comments    Pt tolerated treatment well today. Able to improve overall ambulation distance, tolerance to activity, balance, and assist levels. Was grossly supervision for safety with bed mobility, transfers, and gait. Able to demonstrate improvements in overall quality of gait, balance, pain, and endurance with use of RW vs. No AD. Encouraged pt to continue to use RW for energy conservation, balance, and pain management short-term while he continues to rehab. Pt agreeable. Able to wean off supplemental O2 with SpO2 >90% on RA throughout session. Pt will continue to benefit from skilled acute PT services to address deficits for return to baseline function. Will continue to recommend HHPT at DC.      Recommendations for follow up therapy are one component of a multi-disciplinary discharge planning process, led by the attending physician.  Recommendations may be updated based on patient status, additional functional criteria and insurance authorization.  Follow Up Recommendations  Home health PT     Assistance Recommended at Discharge Set up Supervision/Assistance  Patient can return home with the following A little help with bathing/dressing/bathroom;Assistance with cooking/housework;Help with stairs or ramp for entrance;Assist for transportation;Direct supervision/assist for financial  management;Direct supervision/assist for medications management   Equipment Recommendations  None recommended by PT       Precautions / Restrictions Precautions Precautions: Fall Restrictions Weight Bearing Restrictions: No     Mobility  Bed Mobility Overal bed mobility: Needs Assistance Bed Mobility: Supine to Sit     Supine to sit: Supervision     General bed mobility comments: for safety to sit EOB, increased time/effort    Transfers Overall transfer level: Needs assistance Equipment used: Rolling walker (2 wheels), None Transfers: Sit to/from Stand Sit to Stand: Supervision           General transfer comment: for safety to stand from EOB with and without AD; improved balance with use of AD    Ambulation/Gait Ambulation/Gait assistance: Supervision Gait Distance (Feet): 60 Feet (33ft x1 without AD; 60ft x1 with RW) Assistive device: Rolling walker (2 wheels), None         General Gait Details: Improved quality of gait and balance with use of RW vs. no AD. Antalgic gait without AD needing intermittent UE support from furniture for Bradley [name].    Balance Overall balance assessment: Modified Independent (stands bedside with RW x3 minutes)                                          Cognition Arousal/Alertness: Awake/alert Behavior During Therapy: WFL for tasks assessed/performed Overall Cognitive Status: Within Functional Limits for tasks assessed  Exercises Other Exercises Other Exercises: Participates in bed mobility, transfers, and gait. Improved quality of gait with use of RW. Other Exercises: Pt educated re: PT role/POC, DC recommendations, benefits of RW, pain management, energy conservation.    General Comments General comments (skin integrity, edema, etc.): BLE wrapped      Pertinent Vitals/Pain Pain Assessment Pain Assessment: Faces Faces Pain Scale: Hurts a little  bit Pain Location: Rt posterior heel/achilles Pain Intervention(s): Limited activity within patient's tolerance, Monitored during session, Premedicated before session, Repositioned     PT Goals (current goals can now be found in the care plan section) Acute Rehab PT Goals Patient Stated Goal: better pain control for foot PT Goal Formulation: With patient Time For Goal Achievement: 09/13/22 Potential to Achieve Goals: Good Progress towards PT goals: Progressing toward goals    Frequency    Min 2X/week      PT Plan Current plan remains appropriate       AM-PAC PT "6 Clicks" Mobility   Outcome Measure  Help needed turning from your back to your side while in a flat bed without using bedrails?: None Help needed moving from lying on your back to sitting on the side of a flat bed without using bedrails?: None Help needed moving to and from a bed to a chair (including a wheelchair)?: None Help needed standing up from a chair using your arms (e.g., wheelchair or bedside chair)?: None Help needed to walk in hospital room?: A Little Help needed climbing 3-5 steps with a railing? : A Little 6 Click Score: 22    End of Session Equipment Utilized During Treatment: Gait belt;Oxygen (able to doff O2 with SpO2 >90% throughout) Activity Tolerance: Patient tolerated treatment well;Patient limited by pain Patient left: in bed;with family/visitor present;with call bell/phone within reach Nurse Communication: Mobility status PT Visit Diagnosis: Other abnormalities of gait and mobility (R26.89);Unsteadiness on feet (R26.81)     Time: 3474-2595 PT Time Calculation (min) (ACUTE ONLY): 34 min  Charges:  $Therapeutic Exercise: 8-22 mins $Therapeutic Activity: 8-22 mins                     Herminio Commons, PT, DPT 3:01 PM,09/07/22 Physical Therapist - [facility]

## 2022-09-07 NOTE — Plan of Care (Signed)
  Problem: Activity: ?Goal: Capacity to carry out activities will improve ?Outcome: Progressing ?  ?

## 2022-09-07 NOTE — Progress Notes (Signed)
  Echocardiogram 2D Echocardiogram has been performed. A Limited Echo was requested and completed.  Claretta Fraise 09/07/2022, 3:30 PM

## 2022-09-07 NOTE — Progress Notes (Signed)
Rounding Note    Patient Name: Bradley [name] Date of Encounter: 09/07/2022  [facility] Cardiologist: Julien Nordmann, MD   Subjective   Kidney function worse this AM, lasix held. Patient is overall feeling better. No chest pain reported. K5.3  Inpatient Medications    Scheduled Meds:  amiodarone  100 mg Oral Daily   apixaban  2.5 mg Oral BID   aspirin EC  81 mg Oral Daily   atorvastatin  80 mg Oral QHS   brinzolamide  1 drop Both Eyes TID   And   brimonidine  1 drop Both Eyes TID   Chlorhexidine Gluconate Cloth  6 each Topical Daily   collagenase   Topical Daily   cyanocobalamin  1,000 mcg Oral Daily   doxycycline  100 mg Oral Q12H   furosemide  40 mg Intravenous Daily   insulin aspart  0-5 Units Subcutaneous QHS   insulin aspart  0-9 Units Subcutaneous TID WC   insulin aspart  3 Units Subcutaneous TID WC   isosorbide mononitrate  30 mg Oral Daily   methylPREDNISolone (SOLU-MEDROL) injection  80 mg Intravenous Q24H   metoprolol succinate  50 mg Oral Daily   mometasone-formoterol  2 puff Inhalation BID   pantoprazole  40 mg Oral Daily   polyethylene glycol  17 g Oral BID   sacubitril-valsartan  1 tablet Oral BID   timolol  1 drop Both Eyes QHS   Travoprost (BAK Free)  1 drop Both Eyes QHS   Continuous Infusions:  sodium chloride Stopped (08/24/22 1617)   sodium chloride     cefTRIAXone (ROCEPHIN)  IV 1 g (09/06/22 1315)   PRN Meds: sodium chloride, sodium chloride, acetaminophen, albuterol, dextromethorphan-guaiFENesin, ipratropium-albuterol, methocarbamol, nitroGLYCERIN, ondansetron (ZOFRAN) IV, oxyCODONE, sodium chloride flush   Vital Signs    Vitals:   09/06/22 1642 09/06/22 2018 09/07/22 0411 09/07/22 0427  BP: 97/65 (!) 97/56 (!) 100/58   Pulse: 63 60 (!) 53   Resp: 20 20 20    Temp: 97.9 F (36.6 C) 97.6 F (36.4 C) (!) 97.3 F (36.3 C)   TempSrc:      SpO2: 100% 98% 99%   Weight:    88.2 kg  Height:        Intake/Output Summary (Last  24 hours) at 09/07/2022 0723 Last data filed at 09/06/2022 2200 Gross per 24 hour  Intake 440 ml  Output --  Net 440 ml      09/07/2022    4:27 AM 09/06/2022    6:05 AM 09/05/2022    4:29 AM  Last 3 Weights  Weight (lbs) 194 lb 7.1 oz 195 lb 1.7 oz 194 lb 14.2 oz  Weight (kg) 88.2 kg 88.5 kg 88.4 kg      Telemetry    Nsr Hr 70s - Personally Reviewed  ECG    No new - Personally Reviewed  Physical Exam   GEN: No acute distress.   Neck: No JVD Cardiac: RRR, no murmurs, rubs, or gallops.  Respiratory: Clear to auscultation bilaterally. GI: Soft, nontender, non-distended  MS: No edema; No deformity. Neuro:  Nonfocal  Psych: Normal affect   Labs    High Sensitivity Troponin:   Recent Labs  Lab 08/28/22 1426 08/28/22 2138 08/29/22 0518 09/06/22 0608 09/06/22 0757  TROPONINIHS 2,572* 2,172* 1,216* 60* 54*     Chemistry Recent Labs  Lab 09/04/22 0628 09/05/22 0344 09/06/22 0346  NA 134* 135 134*  K 4.5 4.3 4.8  CL 97* 98 99  CO2  27 26 23   GLUCOSE 175* 124* 178*  BUN 74* 73* 67*  CREATININE 2.17* 2.09* 1.80*  CALCIUM 8.7* 8.5* 8.7*  MG 2.5* 2.5* 2.6*  GFRNONAA 30* 31* 37*  ANIONGAP 10 11 12     Lipids No results for input(s): "CHOL", "TRIG", "HDL", "LABVLDL", "LDLCALC", "CHOLHDL" in the last 168 hours.  Hematology Recent Labs  Lab 09/04/22 0628 09/05/22 0344 09/07/22 0649  WBC 8.0 7.7 8.6  RBC 3.54* 3.48* 3.63*  HGB 10.3* 10.2* 10.8*  HCT 34.2* 33.1* 34.8*  MCV 96.6 95.1 95.9  MCH 29.1 29.3 29.8  MCHC 30.1 30.8 31.0  RDW 22.2* 22.1* 22.0*  PLT 243 245 289   Thyroid No results for input(s): "TSH", "FREET4" in the last 168 hours.  BNP Recent Labs  Lab 09/01/22 0616 09/05/22 0344  BNP 3,682.6* 3,373.6*    DDimer No results for input(s): "DDIMER" in the last 168 hours.   Radiology    DG Chest Port 1 View  Result Date: 09/06/2022 CLINICAL DATA:  Chest pain. EXAM: PORTABLE CHEST 1 VIEW COMPARISON:  09/05/2022 FINDINGS: Stable heart size  status post prior CABG. Increase in pulmonary interstitial edema and bilateral pleural effusions, right greater than left. Associated bibasilar atelectasis. No pneumothorax or focal airspace consolidation. IMPRESSION: Increase in pulmonary interstitial edema and bilateral pleural effusions, right greater than left. Electronically Signed   By: Aletta Edouard M.D.   On: 09/06/2022 08:52   DG Chest 2 View  Result Date: 09/05/2022 CLINICAL DATA:  Possible pneumonia EXAM: CHEST - 2 VIEW COMPARISON:  Chest x-ray dated September 02, 2022 FINDINGS: Unchanged cardiomegaly. Prior median sternotomy and CABG. Similar interstitial opacities and small bilateral pleural effusions. Lower lung predominant linear opacities are likely due to atelectasis. No evidence of pneumothorax. IMPRESSION: Similar interstitial opacities and small bilateral pleural effusions, likely due to pulmonary edema. Electronically Signed   By: Yetta Glassman M.D.   On: 09/05/2022 17:55    Cardiac Studies   Echo cardiogram 08/28/22 Left ventricular ejection fraction, by estimation, is 40 to 45%. The  left ventricle has mildly decreased function. The left ventricle  demonstrates global hypokinesis with severe hypokinesis of the bassal  anterior and anteroseptal wall, and inferior  wall. Left ventricular diastolic parameters are consistent with Grade II  diastolic dysfunction (pseudonormalization). There is the interventricular  septum is flattened in systole and diastole, consistent with right  ventricular pressure and volume  overload.   2. Right ventricular systolic function is normal. The right ventricular  size is normal. There is moderately elevated pulmonary artery systolic  pressure. The estimated right ventricular systolic pressure is 62.9 mmHg.   3. Left atrial size was mildly dilated.   4. Right atrial size was mildly dilated.   5. The mitral valve is normal in structure. Mild to moderate mitral valve  regurgitation. No  evidence of mitral stenosis.   6. Tricuspid valve regurgitation is mild to moderate.   7. The aortic valve is tricuspid. There is moderate calcification of the  aortic valve. Aortic valve regurgitation is mild to moderate. Mild aortic  valve stenosis by Aortic valve mean gradient measures 14.3 mmHg. Aortic  valve Vmax measures 2.54  m/s.Moderate stenosis by Aortic valve area, by VTI measures 0.89 cm.   8. The inferior vena cava is normal in size with greater than 50%  respiratory variability, suggesting right atrial pressure of 3 mmHg.    Echo 02/2022 1. Left ventricular ejection fraction, by estimation, is 40 to 45%. The  left ventricle has  mildly decreased function. The left ventricle  demonstrates regional wall motion abnormalities (see scoring  diagram/findings for description). There is moderate  left ventricular hypertrophy. Left ventricular diastolic parameters are  consistent with Grade II diastolic dysfunction (pseudonormalization).  Elevated left atrial pressure. There is severe hypokinesis of the left  ventricular, basal-mid inferoseptal wall  and inferior wall.   2. Right ventricular systolic function is mildly reduced. The right  ventricular size is normal.   3. Left atrial size was mildly dilated.   4. Right atrial size was mildly dilated.   5. The mitral valve is degenerative. Mild mitral valve regurgitation.   6. The aortic valve is tricuspid. There is moderate calcification of the  aortic valve. There is moderate thickening of the aortic valve. Aortic  valve regurgitation is mild. 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  While the mean gradient is only mildly   elevated, the abnormal valve area and dimensionless index calculations  suggest at least moderate to severe low-flow:low-gradient aortic stenosis.  Visual assessment of leaflet movement suggests that aortic stenosis is not  critical.   7. The inferior vena cava is normal in size with <50% respiratory   variability, suggesting right atrial pressure of 8 mmHg.    Cardiac cath 06/2021 Conclusions: Severe native coronary artery disease with 90% distal LMCA stenosis, diffuse proximal LAD disease with functional mid vessel occlusion with competitive flow from LIMA-LAD, 99% proximal OM1 stenosis, and CTO of proximal RCA. Widely patent LIMA-LAD and SVG-OM3. Patent SVG-RPDA with 70% stenosis near distal anastomosis.  RPDA distal to SVG anastomosis is occluded (? culprit lesion for NSTEMI). Mildly elevated left ventricular filling pressure (LVEDP 15-20 mmHg). Mild-moderate aortic valve stenosis (peak-to-peak gradient ~20 mmHg).   Recommendations: Medical therapy.  Would only consider PCI to distal anastomosis of SVG-RPDA for refractory symptoms. Load with clopidogrel 300 mg daily followed by 75 mg daily thereafter for 12 months. Aggressive secondary prevention and initiation of goal-directed medical therapy for acute systolic heart failure.   Nelva Bush, MD [facility]    Patient Profile     82 y.o. 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    Assessment & Plan    Chest pain CAD s/p CABG - patient initially presented with chest pain and trop peak of 2500 started with IV heparin - patient had an episode of suspected MSK chest pain 1/11 overnight. HS trop unremarkable - No further chest pain reported - Cardiac cath 1 month ago at an outside facility showed stable severe 3V CAD, patent grafts - continue medical management with Aspirin, Lipitor, Imdur and metoprolol   Acute on chronic combined systolic and diastolic CHF - Echo showed LVEF 40-45%, G2DD - BNP >4500 - IV lasix 80mg   BID - diuretics/jardiance/spiro held for worsening Scr - continue Entresto 24-26mg BID - UOP -10.7L - Kidney function improved and he was re-started on IV lasix 40mg  daily 1/12 - Kidney function worse today and lasix held again, continue to trend kidney function, may need to hold Entresto - He appears euvolemic on exam   Acute on chronic respiratory distress RSV - per IM   Aortic Valve stenosis - recent evaluation at [place] with dobutamine echo - moderate AS - no plan for TAVR at this time   PAD with  critical limb ischemia - s/p right iliac PTA and stenting this admission - residual SFA disease with plan for outpatient follow-up and consideration for repeat procedure in the future - continue ASA and statin   Paroxysmal Afib - maintaining NSR - CHADSVASC at least 6 - continue Eliquis 2.5mg  BID  - amiodarone 100mg  daily   Acute on chronic CKD stage 3 - Scr up to 2.84 on admission  - Scr 1.8/BUN 67 - nephrology is following   Hyperkalemia - does not appear to be receiving supplemental K - follow with daily BMET  For questions or updates, please contact [facility] Please consult [email or web address] for contact info under        Signed, Esterlene Atiyeh , PA-C  09/07/2022, 7:23 AM

## 2022-09-07 NOTE — Progress Notes (Signed)
[facility]  ROUNDING NOTE   Subjective:   Patient seen sitting at side of bed Completed breakfast at bedside Reports respiratory status has improved, remains on 4 L nasal cannula Lower extremity edema slightly improved  Objective:  Vital signs in last 24 hours:  Temp:  [97.3 F (36.3 C)-97.9 F (36.6 C)] 97.5 F (36.4 C) (01/13 0745) Pulse Rate:  [53-63] 57 (01/13 0745) Resp:  [12-20] 12 (01/13 0745) BP: (97-119)/(56-65) 119/63 (01/13 0745) SpO2:  [98 %-100 %] 100 % (01/13 0745) Weight:  [88.2 kg] 88.2 kg (01/13 0427)  Weight change: -0.3 kg Filed Weights   09/05/22 0429 09/06/22 0605 09/07/22 0427  Weight: 88.4 kg 88.5 kg 88.2 kg    Intake/Output: I/O last 3 completed shifts: In: 540 [P.O.:340; IV Piggyback:200] Out: 500 [Urine:500]   Intake/Output this shift:  No intake/output data recorded.  Physical Exam: General: NAD  Head: Normocephalic, atraumatic.   Eyes: Anicteric  Lungs:  Coarse normal effort, 4L [place]  Heart: Regular rate and rhythm  Abdomen:  Soft, nontender, nondistended  Extremities: 1+ peripheral edema.  Neurologic: Alert and oriented, moving all four extremities  Skin: No lesions  Access: None    Basic Metabolic Panel: Recent Labs  Lab 09/03/22 0642 09/04/22 0628 09/05/22 0344 09/06/22 0346 09/07/22 0649  NA 135 134* 135 134* 131*  K 4.0 4.5 4.3 4.8 5.3*  CL 97* 97* 98 99 96*  CO2 30 27 26 23 23   GLUCOSE 181* 175* 124* 178* 223*  BUN 65* 74* 73* 67* 89*  CREATININE 2.20* 2.17* 2.09* 1.80* 2.76*  CALCIUM 8.5* 8.7* 8.5* 8.7* 8.7*  MG 2.3 2.5* 2.5* 2.6* 2.6*     Liver Function Tests: No results for input(s): "AST", "ALT", "ALKPHOS", "BILITOT", "PROT", "ALBUMIN" in the last 168 hours. No results for input(s): "LIPASE", "AMYLASE" in the last 168 hours. No results for input(s): "AMMONIA" in the last 168 hours.  CBC: Recent Labs  Lab 09/02/22 0520 09/03/22 0642 09/04/22 0628 09/05/22 0344 09/07/22 0649  WBC 7.8 6.9  8.0 7.7 8.6  HGB 10.1* 9.4* 10.3* 10.2* 10.8*  HCT 33.2* 31.3* 34.2* 33.1* 34.8*  MCV 95.7 96.9 96.6 95.1 95.9  PLT 206 202 243 245 289     Cardiac Enzymes: No results for input(s): "CKTOTAL", "CKMB", "CKMBINDEX", "TROPONINI" in the last 168 hours.  BNP: Invalid input(s): "POCBNP"  CBG: Recent Labs  Lab 09/06/22 0804 09/06/22 1238 09/06/22 1644 09/06/22 2126 09/07/22 0751  GLUCAP 174* 195* 229* 284* 212*     Microbiology: Results for orders placed or performed during the hospital encounter of 08/22/22  Blood culture (routine x 2)     Status: None   Collection Time: 08/22/22  4:20 PM   Specimen: BLOOD  Result Value Ref Range Status   Specimen Description BLOOD BLOOD RIGHT WRIST  Final   Special Requests   Final    BOTTLES DRAWN AEROBIC AND ANAEROBIC Blood Culture adequate volume   Culture   Final    NO GROWTH 5 DAYS Performed at [facility], [address], [place], [place]    Report Status 08/27/2022 FINAL  Final  Blood culture (routine x 2)     Status: None   Collection Time: 08/22/22  4:20 PM   Specimen: BLOOD  Result Value Ref Range Status   Specimen Description BLOOD BLOOD RIGHT FOREARM  Final   Special Requests   Final    BOTTLES DRAWN AEROBIC AND ANAEROBIC Blood Culture adequate volume   Culture  Final    NO GROWTH 5 DAYS Performed at [facility], [address] [place], [place] [address]    Report Status 08/27/2022 FINAL  Final  Resp panel by RT-PCR (RSV, Flu A&B, Covid)     Status: Abnormal   Collection Time: 09/02/22  4:51 PM  Result Value Ref Range Status   SARS Coronavirus 2 by RT PCR NEGATIVE NEGATIVE Final    Comment: (NOTE) SARS-CoV-2 target nucleic acids are NOT DETECTED.  The SARS-CoV-2 RNA is generally detectable in upper respiratory specimens during the acute phase of infection. The lowest concentration of SARS-CoV-2 viral copies this assay can detect is 138 copies/mL. A negative result does not preclude  SARS-Cov-2 infection and should not be used as the sole basis for treatment or other patient management decisions. A negative result may occur with  improper specimen collection/handling, submission of specimen other than nasopharyngeal swab, presence of viral mutation(s) within the areas targeted by this assay, and inadequate number of viral copies(<138 copies/mL). A negative result must be combined with clinical observations, patient history, and epidemiological information. The expected result is Negative.  Fact Sheet for Patients:  EntrepreneurPulse.com.au  Fact Sheet for Healthcare Providers:  IncredibleEmployment.be  This test is no t yet approved or cleared by the [place] FDA and  has been authorized for detection and/or diagnosis of SARS-CoV-2 by FDA under an Emergency Use Authorization (EUA). This EUA will remain  in effect (meaning this test can be used) for the duration of the COVID-19 declaration under Section 564(b)(1) of the Act, 21 U.S.C.section 360bbb-3(b)(1), unless the authorization is terminated  or revoked sooner.       Influenza A by PCR NEGATIVE NEGATIVE Final   Influenza B by PCR NEGATIVE NEGATIVE Final    Comment: (NOTE) The Xpert Xpress SARS-CoV-2/FLU/RSV plus assay is intended as an aid in the diagnosis of influenza from Nasopharyngeal swab specimens and should not be used as a sole basis for treatment. Nasal washings and aspirates are unacceptable for Xpert Xpress SARS-CoV-2/FLU/RSV testing.  Fact Sheet for Patients: EntrepreneurPulse.com.au  Fact Sheet for Healthcare Providers: IncredibleEmployment.be  This test is not yet approved or cleared by the [place] FDA and has been authorized for detection and/or diagnosis of SARS-CoV-2 by FDA under an Emergency Use Authorization (EUA). This EUA will remain in effect (meaning this test can be used) for the duration of  the COVID-19 declaration under Section 564(b)(1) of the Act, 21 U.S.C. section 360bbb-3(b)(1), unless the authorization is terminated or revoked.     Resp Syncytial Virus by PCR POSITIVE (A) NEGATIVE Final    Comment: (NOTE) Fact Sheet for Patients: EntrepreneurPulse.com.au  Fact Sheet for Healthcare Providers: IncredibleEmployment.be  This test is not yet approved or cleared by the [place] FDA and has been authorized for detection and/or diagnosis of SARS-CoV-2 by FDA under an Emergency Use Authorization (EUA). This EUA will remain in effect (meaning this test can be used) for the duration of the COVID-19 declaration under Section 564(b)(1) of the Act, 21 U.S.C. section 360bbb-3(b)(1), unless the authorization is terminated or revoked.  Performed at [facility], [address] [place], [place] [address]     Coagulation Studies: No results for input(s): "LABPROT", "INR" in the last 72 hours.  Urinalysis: No results for input(s): "COLORURINE", "LABSPEC", "PHURINE", "GLUCOSEU", "HGBUR", "BILIRUBINUR", "KETONESUR", "PROTEINUR", "UROBILINOGEN", "NITRITE", "LEUKOCYTESUR" in the last 72 hours.  Invalid input(s): "APPERANCEUR"    Imaging: DG Chest Port 1 View  Result Date: 09/06/2022 CLINICAL DATA:  Chest  pain. EXAM: PORTABLE CHEST 1 VIEW COMPARISON:  09/05/2022 FINDINGS: Stable heart size status post prior CABG. Increase in pulmonary interstitial edema and bilateral pleural effusions, right greater than left. Associated bibasilar atelectasis. No pneumothorax or focal airspace consolidation. IMPRESSION: Increase in pulmonary interstitial edema and bilateral pleural effusions, right greater than left. Electronically Signed   By: Irish Lack M.D.   On: 09/06/2022 08:52   DG Chest 2 View  Result Date: 09/05/2022 CLINICAL DATA:  Possible pneumonia EXAM: CHEST - 2 VIEW COMPARISON:  Chest x-ray dated September 02, 2022 FINDINGS:  Unchanged cardiomegaly. Prior median sternotomy and CABG. Similar interstitial opacities and small bilateral pleural effusions. Lower lung predominant linear opacities are likely due to atelectasis. No evidence of pneumothorax. IMPRESSION: Similar interstitial opacities and small bilateral pleural effusions, likely due to pulmonary edema. Electronically Signed   By: Allegra Lai M.D.   On: 09/05/2022 17:55     Medications:    sodium chloride Stopped (08/24/22 1617)   sodium chloride     albumin human     cefTRIAXone (ROCEPHIN)  IV 1 g (09/06/22 1315)    amiodarone  100 mg Oral Daily   apixaban  2.5 mg Oral BID   aspirin EC  81 mg Oral Daily   atorvastatin  80 mg Oral QHS   brinzolamide  1 drop Both Eyes TID   And   brimonidine  1 drop Both Eyes TID   Chlorhexidine Gluconate Cloth  6 each Topical Daily   collagenase   Topical Daily   cyanocobalamin  1,000 mcg Oral Daily   doxycycline  100 mg Oral Q12H   hydrALAZINE  10 mg Oral Q8H   insulin aspart  0-5 Units Subcutaneous QHS   insulin aspart  0-9 Units Subcutaneous TID WC   insulin aspart  3 Units Subcutaneous TID WC   isosorbide mononitrate  30 mg Oral Daily   methylPREDNISolone (SOLU-MEDROL) injection  80 mg Intravenous Q24H   metoprolol succinate  50 mg Oral Daily   mometasone-formoterol  2 puff Inhalation BID   pantoprazole  40 mg Oral Daily   polyethylene glycol  17 g Oral BID   timolol  1 drop Both Eyes QHS   Travoprost (BAK Free)  1 drop Both Eyes QHS   sodium chloride, sodium chloride, acetaminophen, albuterol, dextromethorphan-guaiFENesin, ipratropium-albuterol, methocarbamol, nitroGLYCERIN, ondansetron (ZOFRAN) IV, oxyCODONE, sodium chloride flush  Assessment/ Plan:  [name] is a 82 y.o.  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  Baseline GFR appears to be between 35 and 37.  Current GFR 31, has been as low as 29.    Renal function elevated yesterday, likely due to 1 dose of IV furosemide 40 mg.  Agree with holding diuretic today.  1 dose did improve edema.  Lab Results  Component Value Date   CREATININE 2.76 (H) 09/07/2022   CREATININE 1.80 (H) 09/06/2022   CREATININE 2.09 (H) 09/05/2022    Intake/Output Summary (Last 24 hours) at 09/07/2022 1107 Last data filed at 09/06/2022 2200 Gross per 24 hour  Intake 200 ml  Output --  Net 200 ml    2. .  Acute on chronic systolic heart failure.  Echo completed on 08/28/2022 shows EF 40 to 45% with grade 2 diastolic dysfunction.  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  Also notes aortic calcification with stenosis.  Cardiology following.  Diuretics held today.    LOS: [place] 1/13/202411:07 AM

## 2022-09-08 DIAGNOSIS — B338 Other specified viral diseases: Secondary | ICD-10-CM | POA: Diagnosis not present

## 2022-09-08 DIAGNOSIS — I272 Pulmonary hypertension, unspecified: Secondary | ICD-10-CM | POA: Diagnosis not present

## 2022-09-08 DIAGNOSIS — N1831 Chronic kidney disease, stage 3a: Secondary | ICD-10-CM | POA: Diagnosis not present

## 2022-09-08 DIAGNOSIS — N170 Acute kidney failure with tubular necrosis: Secondary | ICD-10-CM | POA: Diagnosis not present

## 2022-09-08 DIAGNOSIS — I5043 Acute on chronic combined systolic (congestive) and diastolic (congestive) heart failure: Secondary | ICD-10-CM | POA: Diagnosis not present

## 2022-09-08 LAB — BASIC METABOLIC PANEL
Anion gap: 10 (ref 5–15)
BUN: 100 mg/dL — ABNORMAL HIGH (ref 8–23)
CO2: 23 mmol/L (ref 22–32)
Calcium: 8.5 mg/dL — ABNORMAL LOW (ref 8.9–10.3)
Chloride: 98 mmol/L (ref 98–111)
Creatinine, Ser: 3.36 mg/dL — ABNORMAL HIGH (ref 0.61–1.24)
GFR, Estimated: 18 mL/min — ABNORMAL LOW (ref >60)
Glucose, Bld: 167 mg/dL — ABNORMAL HIGH (ref 70–99)
Potassium: 4.8 mmol/L (ref 3.5–5.1)
Sodium: 131 mmol/L — ABNORMAL LOW (ref 135–145)

## 2022-09-08 LAB — GLUCOSE, CAPILLARY
Glucose-Capillary: 196 mg/dL — ABNORMAL HIGH (ref 70–99)
Glucose-Capillary: 160 mg/dL — ABNORMAL HIGH (ref 70–99)
Glucose-Capillary: 152 mg/dL — ABNORMAL HIGH (ref 70–99)
Glucose-Capillary: 192 mg/dL — ABNORMAL HIGH (ref 70–99)

## 2022-09-08 MED ORDER — FUROSEMIDE 10 MG/ML IJ SOLN
5.0000 mg/h | INTRAVENOUS | Status: DC
  Administered 2022-09-08 – 2022-09-10 (×2): 3 mg/h via INTRAVENOUS
  Administered 2022-09-12: 5 mg/h via INTRAVENOUS
  Filled 2022-09-08 (×5): qty 20

## 2022-09-08 NOTE — Progress Notes (Addendum)
Cardiology Progress Note  Patient ID: Riyaan Heroux MRN: [id number] DOB: [birth year] Date of Encounter: 09/08/2022  Primary Cardiologist: Julien Nordmann, MD  Subjective   Chief Complaint: Shortness of breath  HPI: Serum creatinine continues to rise.  Remains volume overloaded.  Echo shows EF is unchanged.  Discussed with renal.  Consideration to Lasix drip.  Suspect he may end up needing hemodialysis.  ROS:  All other ROS reviewed and negative. Pertinent positives noted in the HPI.     Inpatient Medications  Scheduled Meds:  amiodarone  100 mg Oral Daily   apixaban  2.5 mg Oral BID   aspirin EC  81 mg Oral Daily   atorvastatin  80 mg Oral QHS   brinzolamide  1 drop Both Eyes TID   And   brimonidine  1 drop Both Eyes TID   Chlorhexidine Gluconate Cloth  6 each Topical Daily   collagenase   Topical Daily   cyanocobalamin  1,000 mcg Oral Daily   doxycycline  100 mg Oral Q12H   hydrALAZINE  10 mg Oral Q8H   insulin aspart  0-5 Units Subcutaneous QHS   insulin aspart  0-9 Units Subcutaneous TID WC   insulin aspart  3 Units Subcutaneous TID WC   isosorbide mononitrate  30 mg Oral Daily   metoprolol succinate  50 mg Oral Daily   mometasone-formoterol  2 puff Inhalation BID   pantoprazole  40 mg Oral Daily   polyethylene glycol  17 g Oral BID   predniSONE  40 mg Oral Q breakfast   timolol  1 drop Both Eyes QHS   Travoprost (BAK Free)  1 drop Both Eyes QHS   Continuous Infusions:  sodium chloride Stopped (08/24/22 1617)   sodium chloride     cefTRIAXone (ROCEPHIN)  IV 1 g (09/07/22 1646)   PRN Meds: sodium chloride, sodium chloride, acetaminophen, albuterol, dextromethorphan-guaiFENesin, ipratropium-albuterol, methocarbamol, nitroGLYCERIN, ondansetron (ZOFRAN) IV, oxyCODONE, sodium chloride flush   Vital Signs   Vitals:   09/07/22 2100 09/08/22 0423 09/08/22 0500 09/08/22 0836  BP:  (!) 109/54  100/61  Pulse:  (!) 54  (!) 58  Resp:  17  18  Temp:  97.7 F (36.5 C)  97.6  F (36.4 C)  TempSrc:  Oral  Oral  SpO2: 97% 96%  96%  Weight:   88.6 kg   Height:        Intake/Output Summary (Last 24 hours) at 09/08/2022 1020 Last data filed at 09/07/2022 2200 Gross per 24 hour  Intake 100 ml  Output --  Net 100 ml      09/08/2022    5:00 AM 09/07/2022    4:27 AM 09/06/2022    6:05 AM  Last 3 Weights  Weight (lbs) 195 lb 5.2 oz 194 lb 7.1 oz 195 lb 1.7 oz  Weight (kg) 88.6 kg 88.2 kg 88.5 kg      Telemetry  Overnight telemetry shows sinus rhythm in the 60s, intermittent junctional rhythm, which I personally reviewed.   Physical Exam   Vitals:   09/07/22 2100 09/08/22 0423 09/08/22 0500 09/08/22 0836  BP:  (!) 109/54  100/61  Pulse:  (!) 54  (!) 58  Resp:  17  18  Temp:  97.7 F (36.5 C)  97.6 F (36.4 C)  TempSrc:  Oral  Oral  SpO2: 97% 96%  96%  Weight:   88.6 kg   Height:        Intake/Output Summary (Last 24 hours) at 09/08/2022 1020 Last data  filed at 09/07/2022 2200 Gross per 24 hour  Intake 100 ml  Output --  Net 100 ml       09/08/2022    5:00 AM 09/07/2022    4:27 AM 09/06/2022    6:05 AM  Last 3 Weights  Weight (lbs) 195 lb 5.2 oz 194 lb 7.1 oz 195 lb 1.7 oz  Weight (kg) 88.6 kg 88.2 kg 88.5 kg    Body mass index is 26.49 kg/m.  General: Well nourished, well developed, in no acute distress Head: Atraumatic, normal size  Eyes: PEERLA, EOMI  Neck: Supple, JVD 12 to 15 cm of water Endocrine: No thryomegaly Cardiac: Normal S1, S2; RRR; no murmurs, rubs, or gallops Lungs: Diminished breath sounds bilaterally Abd: Soft, nontender, no hepatomegaly  Ext: 2+ pitting edema Musculoskeletal: No deformities, BUE and BLE strength normal and equal Skin: Warm and dry, no rashes   Neuro: Alert and oriented to person, place, time, and situation, CNII-XII grossly intact, no focal deficits  Psych: Normal mood and affect   Labs  High Sensitivity Troponin:   Recent Labs  Lab 08/28/22 1426 08/28/22 2138 08/29/22 0518 09/06/22 0608  09/06/22 0757  TROPONINIHS 2,572* 2,172* 1,216* 60* 54*     Cardiac EnzymesNo results for input(s): "TROPONINI" in the last 168 hours. No results for input(s): "TROPIPOC" in the last 168 hours.  Chemistry Recent Labs  Lab 09/06/22 0346 09/07/22 0649 09/08/22 0552  NA 134* 131* 131*  K 4.8 5.3* 4.8  CL 99 96* 98  CO2 23 23 23   GLUCOSE 178* 223* 167*  BUN 67* 89* 100*  CREATININE 1.80* 2.76* 3.36*  CALCIUM 8.7* 8.7* 8.5*  GFRNONAA 37* 22* 18*  ANIONGAP 12 12 10     Hematology Recent Labs  Lab 09/04/22 0628 09/05/22 0344 09/07/22 0649  WBC 8.0 7.7 8.6  RBC 3.54* 3.48* 3.63*  HGB 10.3* 10.2* 10.8*  HCT 34.2* 33.1* 34.8*  MCV 96.6 95.1 95.9  MCH 29.1 29.3 29.8  MCHC 30.1 30.8 31.0  RDW 22.2* 22.1* 22.0*  PLT 243 245 289   BNP Recent Labs  Lab 09/05/22 0344  BNP 3,373.6*    DDimer No results for input(s): "DDIMER" in the last 168 hours.   Radiology  ECHOCARDIOGRAM LIMITED  Result Date: 09/07/2022    ECHOCARDIOGRAM LIMITED REPORT   Patient Name:   HELMUT HENNON Date of Exam: 09/07/2022 Medical Rec #:  [id number]    Height:       72.0 in Accession #:    [id number]   Weight:       194.4 lb Date of Birth:  10-02-1940    BSA:          2.105 m Patient Age:    82 years     BP:           119/63 mmHg Patient Gender: M            HR:           60 bpm. Exam Location:  [facility] Procedure: Limited Echo and Intracardiac Opacification Agent Indications:     CHF I50.21  History:         Patient has prior history of Echocardiogram examinations, most                  recent 08/28/2022.  Sonographer:     Kathlen Brunswick RDCS Referring Phys:  [id number] [place] Diagnosing Phys: Eleonore Chiquito MD  Sonographer Comments: Technically difficult study due to poor echo windows. Image  acquisition challenging due to patient body habitus. IMPRESSIONS  1. Left ventricular ejection fraction, by estimation, is 35 to 40%. The left ventricle has moderately decreased function. The left ventricle demonstrates  global hypokinesis. There is moderate asymmetric left ventricular hypertrophy of the basal-septal segment.  2. Right ventricular systolic function is moderately reduced. The right ventricular size is mildly enlarged.  3. The inferior vena cava is dilated in size with <50% respiratory variability, suggesting right atrial pressure of 15 mmHg. Comparison(s): No significant change from prior study. FINDINGS  Left Ventricle: Left ventricular ejection fraction, by estimation, is 35 to 40%. The left ventricle has moderately decreased function. The left ventricle demonstrates global hypokinesis. Definity contrast agent was given IV to delineate the left ventricular endocardial borders. The left ventricular internal cavity size was normal in size. There is moderate asymmetric left ventricular hypertrophy of the basal-septal segment. Right Ventricle: The right ventricular size is mildly enlarged. No increase in right ventricular wall thickness. Right ventricular systolic function is moderately reduced. Pericardium: There is no evidence of pericardial effusion. Venous: The inferior vena cava is dilated in size with less than 50% respiratory variability, suggesting right atrial pressure of 15 mmHg. Additional Comments: There is a small pleural effusion in the left lateral region.  LEFT VENTRICLE PLAX 2D LVIDd:         5.35 cm LVIDs:         4.25 cm LV PW:         1.00 cm LV IVS:        1.15 cm  LV Volumes (MOD) LV vol d, MOD A2C: 140.0 ml LV vol d, MOD A4C: 117.0 ml LV vol s, MOD A2C: 74.9 ml LV vol s, MOD A4C: 65.7 ml LV SV MOD A2C:     65.1 ml LV SV MOD A4C:     117.0 ml LV SV MOD BP:      59.1 ml LEFT ATRIUM         Index LA diam:    4.60 cm 2.19 cm/m Eleonore Chiquito MD Electronically signed by Eleonore Chiquito MD Signature Date/Time: 09/07/2022/5:29:51 PM    Final     Cardiac Studies  TTE 09/07/2022  1. Left ventricular ejection fraction, by estimation, is 35 to 40%. The  left ventricle has moderately decreased function. The  left ventricle  demonstrates global hypokinesis. There is moderate asymmetric left  ventricular hypertrophy of the basal-septal  segment.   2. Right ventricular systolic function is moderately reduced. The right  ventricular size is mildly enlarged.   3. The inferior vena cava is dilated in size with <50% respiratory  variability, suggesting right atrial pressure of 15 mmHg.   Patient Profile  82 year old male with history of CAD status post CABG, systolic heart failure, moderate aortic stenosis, persistent atrial fibrillation, CKD stage III, hypertension, diabetes, PAD who was admitted on 08/22/2022 for acute on chronic systolic heart failure and acute kidney injury. 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 Course complicated by RSV and non-STEMI.   Assessment & Plan   # Chest pain #Non-STEMI -Admitted with acute on chronic systolic heart failure.  Course complicated by critical limb ischemia. -Recent cath at [facility] with patent LIMA to LAD, patent RIMA to OM 3, SVG to RPLV with 99% distal stenosis at the anastomosis.  Not amenable to revascularization.  RCA native CTO. -This has been managed medically.  He has completed 48 hours of heparin.  No further  chest pain. -Continue aspirin, Lipitor and Imdur.  He is also on a beta-blocker.  # Acute on chronic systolic heart failure, EF 40-45% # AKI # Persistent volume overload -We repeated his echocardiogram yesterday given poor response to diuretics.  EF still roughly 40%.  He is volume overloaded with RV dysfunction. -Discussed his case with renal.  His BUN continues to climb.  Suspect he will end up needing hemodialysis.  They have discussed a possible Lasix drip which would be okay.  I will defer this to renal given his AKI. -We could also consider right heart catheterization to better define his hemodynamics however with an EF of roughly 40% I do not believe he is in  low output heart failure. -Diuretic management per renal. -Continue metoprolol succinate 50 mg daily, Imdur 30 mg daily.  Hydralazine 10 mg 3 times daily. -Not a candidate for ACE/ARB/ARNI/MRA given AKI.  #PAD with critical limb ischemia -Status post right external iliac stenting this admission.  On aspirin and statin.  # Paroxysmal A-fib versus intermittent junctional rhythm -Appears to likely be in A-fib this morning.  Rates are stable.  Continue metoprolol and amiodarone.  Close monitoring if he develops any bradycardia will back off on his metoprolol. -On Eliquis.  If this needs to be held for procedures that can be done.  # RSV -Per hospital medicine    For questions or updates, please contact [facility] Please consult [email or web address] for contact info under   Signed, Gerri Spore T. Flora Lipps, MD, [facility]  [facility]  09/08/2022 10:20 AM

## 2022-09-08 NOTE — Plan of Care (Signed)
  Problem: Education: Goal: Ability to demonstrate management of disease process will improve Outcome: Progressing Goal: Ability to verbalize understanding of medication therapies will improve Outcome: Progressing Goal: Individualized Educational Video(s) Outcome: Progressing   Problem: Activity: Goal: Capacity to carry out activities will improve Outcome: Progressing   Problem: Cardiac: Goal: Ability to achieve and maintain adequate cardiopulmonary perfusion will improve Outcome: Progressing   Problem: Education: Goal: Ability to describe self-care measures that may prevent or decrease complications (Diabetes Survival Skills Education) will improve Outcome: Progressing Goal: Individualized Educational Video(s) Outcome: Progressing   Problem: Coping: Goal: Ability to adjust to condition or change in health will improve Outcome: Progressing   Problem: Fluid Volume: Goal: Ability to maintain a balanced intake and output will improve Outcome: Progressing   Problem: Health Behavior/Discharge Planning: Goal: Ability to identify and utilize available resources and services will improve Outcome: Progressing Goal: Ability to manage health-related needs will improve Outcome: Progressing   Problem: Metabolic: Goal: Ability to maintain appropriate glucose levels will improve Outcome: Progressing   Problem: Nutritional: Goal: Maintenance of adequate nutrition will improve Outcome: Progressing Goal: Progress toward achieving an optimal weight will improve Outcome: Progressing   Problem: Skin Integrity: Goal: Risk for impaired skin integrity will decrease Outcome: Progressing   Problem: Tissue Perfusion: Goal: Adequacy of tissue perfusion will improve Outcome: Progressing   Problem: Education: Goal: Knowledge of General Education information will improve Description: Including pain rating scale, medication(s)/side effects and non-pharmacologic comfort measures Outcome:  Progressing   Problem: Health Behavior/Discharge Planning: Goal: Ability to manage health-related needs will improve Outcome: Progressing   Problem: Clinical Measurements: Goal: Ability to maintain clinical measurements within normal limits will improve Outcome: Progressing Goal: Will remain free from infection Outcome: Progressing Goal: Diagnostic test results will improve Outcome: Progressing Goal: Respiratory complications will improve Outcome: Progressing Goal: Cardiovascular complication will be avoided Outcome: Progressing   Problem: Activity: Goal: Risk for activity intolerance will decrease Outcome: Progressing   Problem: Nutrition: Goal: Adequate nutrition will be maintained Outcome: Progressing   Problem: Coping: Goal: Level of anxiety will decrease Outcome: Progressing   Problem: Elimination: Goal: Will not experience complications related to bowel motility Outcome: Progressing Goal: Will not experience complications related to urinary retention Outcome: Progressing   Problem: Pain Managment: Goal: General experience of comfort will improve Outcome: Progressing   Problem: Safety: Goal: Ability to remain free from injury will improve Outcome: Progressing   Problem: Skin Integrity: Goal: Risk for impaired skin integrity will decrease Outcome: Progressing   Problem: Education: Goal: Understanding of CV disease, CV risk reduction, and recovery process will improve Outcome: Progressing Goal: Individualized Educational Video(s) Outcome: Progressing   Problem: Activity: Goal: Ability to return to baseline activity level will improve Outcome: Progressing   Problem: Cardiovascular: Goal: Ability to achieve and maintain adequate cardiovascular perfusion will improve Outcome: Progressing Goal: Vascular access site(s) Level 0-1 will be maintained Outcome: Progressing   Problem: Health Behavior/Discharge Planning: Goal: Ability to safely manage  health-related needs after discharge will improve Outcome: Progressing   

## 2022-09-08 NOTE — Progress Notes (Signed)
Progress Note   Patient: Bradley [name] QHU:[id number] DOB: [birth year] DOA: 08/22/2022     17 DOS: the patient was seen and examined on 09/08/2022   Brief hospital course: Riggs Dineen is a 82 y.o. 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 Patient stated that he has chronic shortness of breath, which has been progressively worsening in the past several weeks, much worse day of arrival to ED, assoc w/ bilateral leg edema and legs oozing fluid. According to the patient he was sent from the [place] to our hospital with concerns for leg cellulitis. 12/28: pt was found to have BNP > 4500,  WBC 11.1, potassium 5.7, worsening renal function w/ Cr 2.84, VSS 121/54, oxygen saturation 100% on room air.  Chest x-ray showed low volume without infiltration.  Patient was admitted to telemetry bed as inpatient for tx CHF 12/30: Debridement R foot wound w/ podiatry. Vascular surgery planning angiography pending improvementin renal function. 12/31: renal function about same Cr 1.99. Net IO Since Admission: -4,208.77 mL [08/25/22 1516] 01/01: renal fxn improving to Cr 1.78. Angiography planned for tomorrow  01/02: LE angiography w/ stent to R external iliac artery.  1/11.  Patient was found to have RSV positive on 1/8.  Renal function was worse after IV Lasix, but has been better gradually since discontinuation of IV Lasix 3 days ago.       Patient became more short of breath today with some wheezing.  Started steroids and antibiotics. 1/4.  Patient had worsening shortness of breath and hypoxemia on 1/3, received 1 dose of 40 mg IV Lasix.  Since then, renal function is worse again.       Assessment and Plan: RSV infection. Patient developed short of breath and wheezing.  He also has a cough, with a white mucus.  He has a mild elevation of  procalcitonin level.  He also has some bronchospasm.  I am concerned that patient may develop secondary bacterial pneumonia.  Will start antibiotics with Rocephin and Zithromax. Also added Solu-Medrol for bronchospasm.  Continue bronchodilator. Chest x-ray showed vascular congestion. Patient condition improved after the Lasix, no longer has any bronchospasm, off oxygen.  Steroids changed to oral yesterday, will wean down the dose quickly. Will complete 5 days antibiotics.   Acute on chronic combined systolic and diastolic CHF (congestive heart failure) (HCC) Moderate pulmonary hypertension Elevated troponin secondary to chf.  2D echo on 03/12/2022 showed EF of 40-45% with grade 2 diastolic dysfunction.  BNP> 4500. --has been diuresed with IV lasix 80 BID per cardio Diuretics on hold due to worsening renal function, currently still on Entresto and metoprolol. Chest x-ray today showed increased pulmonary edema, received 40 mg IV Lasix 1/2.  Renal function much worse.  Received 25 g albumin, creatinine still going up today.  Respiratory status much improved. Entresto on hold. Due to persistent volume overload, cardiologist and nephrology has started Lasix drip.   acute renal failure superimposed on stage 3a chronic kidney disease ([place]):  Hyperkalemia. Baseline creatinine 1.38 on 03/14/2022.   On Admission, Cr 2.84 As above, patient potassium was normalized after giving Lokelma yesterday.  Renal function still getting worse.  Entresto on hold due to hyperkalemia and worsening renal function. Lasix drip started per nephrology/cardiology, anticipating worsening renal function.  May need dialysis in the future.   Cellulitis of Lower Extremity  Full Thickness  Stasis Ulcers Dorsum R Foot  --Empiric antimicrobial treatment with Rocephin --> po augmentin 12/31, completed a course. --Podiatry --> saw patient 12/30 and performed excisional debridement of the ulcerative areas on the right forefoot.   Plan: --cont dressing change per order   PVD (peripheral vascular disease) ([place]) S/p LE angiography w/ stent to R external iliac artery on 08/28/22 --pt has improved perfusion after the stent placement. Continue statin and aspirin.   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  --chest pain morning of 1/3 with increasing trop from 72 to 2572, trending down since. Completed course of heparin drip, continue aspirin and statin.  Followed by cardiology.   Moderate aortic stenosis Recent echo done showed moderate aortic stenosis at [facility] TAVR is not indicated at this time and cardiology is following   Type II diabetes mellitus with renal manifestations [facility]):  Recent A1c 7.8.   Poorly controlled.   Continue current insulin regimen, resume home regimen at discharge.   Paroxysmal atrial fibrillation (HCC) s/p Cardioversion Amiodarone 100 mg po daily and Metoprolol Succinate 50 mg po Daily --cont Eliquis   Normocytic Anemia --No overt bleeding noted monitor CBC       Subjective:  Patient feels better today with breathing, no cough.  Physical Exam: Vitals:   09/07/22 2100 09/08/22 0423 09/08/22 0500 09/08/22 0836  BP:  (!) 109/54  100/61  Pulse:  (!) 54  (!) 58  Resp:  17  18  Temp:  97.7 F (36.5 C)  97.6 F (36.4 C)  TempSrc:  Oral  Oral  SpO2: 97% 96%  96%  Weight:   88.6 kg   Height:       General exam: Appears calm and comfortable  Respiratory system: Decreased breath sounds. Respiratory effort normal. Cardiovascular system: S1 & S2 heard, RRR. No JVD, murmurs, rubs, gallops or clicks.  Gastrointestinal system: Abdomen is nondistended, soft and nontender. No organomegaly or masses  felt. Normal bowel sounds heard. Central nervous system: Alert and oriented. No focal neurological deficits. Extremities: 2+ leg edema Skin: No rashes, lesions or ulcers Psychiatry: Judgement and insight appear normal. Mood & affect appropriate.    Data Reviewed:  Lab results reviewed.  Family Communication: Niece updated  Disposition: Status is: Inpatient Remains inpatient appropriate because: Severity of disease, IV treatment.  Planned Discharge Destination: Home with Home Health    Time spent: 35 minutes  Author: Sharen Hones, MD 09/08/2022 10:49 AM  For on call review [email or web address].

## 2022-09-08 NOTE — Progress Notes (Signed)
[facility]  ROUNDING NOTE   Subjective:   Patient seen sitting up in bed Weaned to room air Alert and oriented Lower extremity edema remains, worse than yesterday  Objective:  Vital signs in last 24 hours:  Temp:  [97.6 F (36.4 C)-98.8 F (37.1 C)] 97.6 F (36.4 C) (01/14 0836) Pulse Rate:  [54-58] 58 (01/14 0836) Resp:  [15-18] 18 (01/14 0836) BP: (97-109)/(51-61) 100/61 (01/14 0836) SpO2:  [84 %-98 %] 96 % (01/14 0836) Weight:  [88.6 kg] 88.6 kg (01/14 0500)  Weight change: 0.4 kg Filed Weights   09/06/22 0605 09/07/22 0427 09/08/22 0500  Weight: 88.5 kg 88.2 kg 88.6 kg    Intake/Output: I/O last 3 completed shifts: In: 200 [P.O.:200] Out: -    Intake/Output this shift:  No intake/output data recorded.  Physical Exam: General: NAD  Head: Normocephalic, atraumatic.   Eyes: Anicteric  Lungs:  Clear to auscultation normal effort, room air  Heart: Regular rate and rhythm  Abdomen:  Soft, nontender, nondistended  Extremities: 2+ peripheral edema.  Neurologic: Alert and oriented, moving all four extremities  Skin: No lesions  Access: None    Basic Metabolic Panel: Recent Labs  Lab 09/03/22 0642 09/04/22 0628 09/05/22 0344 09/06/22 0346 09/07/22 0649 09/08/22 0552  NA 135 134* 135 134* 131* 131*  K 4.0 4.5 4.3 4.8 5.3* 4.8  CL 97* 97* 98 99 96* 98  CO2 30 27 26 23 23 23   GLUCOSE 181* 175* 124* 178* 223* 167*  BUN 65* 74* 73* 67* 89* 100*  CREATININE 2.20* 2.17* 2.09* 1.80* 2.76* 3.36*  CALCIUM 8.5* 8.7* 8.5* 8.7* 8.7* 8.5*  MG 2.3 2.5* 2.5* 2.6* 2.6*  --      Liver Function Tests: No results for input(s): "AST", "ALT", "ALKPHOS", "BILITOT", "PROT", "ALBUMIN" in the last 168 hours. No results for input(s): "LIPASE", "AMYLASE" in the last 168 hours. No results for input(s): "AMMONIA" in the last 168 hours.  CBC: Recent Labs  Lab 09/02/22 0520 09/03/22 0642 09/04/22 0628 09/05/22 0344 09/07/22 0649  WBC 7.8 6.9 8.0 7.7 8.6   HGB 10.1* 9.4* 10.3* 10.2* 10.8*  HCT 33.2* 31.3* 34.2* 33.1* 34.8*  MCV 95.7 96.9 96.6 95.1 95.9  PLT 206 202 243 245 289     Cardiac Enzymes: No results for input(s): "CKTOTAL", "CKMB", "CKMBINDEX", "TROPONINI" in the last 168 hours.  BNP: Invalid input(s): "POCBNP"  CBG: Recent Labs  Lab 09/07/22 0751 09/07/22 1141 09/07/22 1639 09/07/22 2100 09/08/22 0940  GLUCAP 212* 347* 258* 228* 196*     Microbiology: Results for orders placed or performed during the hospital encounter of 08/22/22  Blood culture (routine x 2)     Status: None   Collection Time: 08/22/22  4:20 PM   Specimen: BLOOD  Result Value Ref Range Status   Specimen Description BLOOD BLOOD RIGHT WRIST  Final   Special Requests   Final    BOTTLES DRAWN AEROBIC AND ANAEROBIC Blood Culture adequate volume   Culture   Final    NO GROWTH 5 DAYS Performed at [facility], [address], [place],  [address]    Report Status 08/27/2022 FINAL  Final  Blood culture (routine x 2)     Status: None   Collection Time: 08/22/22  4:20 PM   Specimen: BLOOD  Result Value Ref Range Status   Specimen Description BLOOD BLOOD RIGHT FOREARM  Final   Special Requests   Final    BOTTLES DRAWN AEROBIC AND ANAEROBIC Blood Culture  adequate volume   Culture   Final    NO GROWTH 5 DAYS Performed at [facility], [address] [place], [place] [address]    Report Status 08/27/2022 FINAL  Final  Resp panel by RT-PCR (RSV, Flu A&B, Covid)     Status: Abnormal   Collection Time: 09/02/22  4:51 PM  Result Value Ref Range Status   SARS Coronavirus 2 by RT PCR NEGATIVE NEGATIVE Final    Comment: (NOTE) SARS-CoV-2 target nucleic acids are NOT DETECTED.  The SARS-CoV-2 RNA is generally detectable in upper respiratory specimens during the acute phase of infection. The lowest concentration of SARS-CoV-2 viral copies this assay can detect is 138 copies/mL. A negative result does not preclude  SARS-Cov-2 infection and should not be used as the sole basis for treatment or other patient management decisions. A negative result may occur with  improper specimen collection/handling, submission of specimen other than nasopharyngeal swab, presence of viral mutation(s) within the areas targeted by this assay, and inadequate number of viral copies(<138 copies/mL). A negative result must be combined with clinical observations, patient history, and epidemiological information. The expected result is Negative.  Fact Sheet for Patients:  BloggerCourse.com  Fact Sheet for Healthcare Providers:  SeriousBroker.it  This test is no t yet approved or cleared by the [place] FDA and  has been authorized for detection and/or diagnosis of SARS-CoV-2 by FDA under an Emergency Use Authorization (EUA). This EUA will remain  in effect (meaning this test can be used) for the duration of the COVID-19 declaration under Section 564(b)(1) of the Act, 21 U.S.C.section 360bbb-3(b)(1), unless the authorization is terminated  or revoked sooner.       Influenza A by PCR NEGATIVE NEGATIVE Final   Influenza B by PCR NEGATIVE NEGATIVE Final    Comment: (NOTE) The Xpert Xpress SARS-CoV-2/FLU/RSV plus assay is intended as an aid in the diagnosis of influenza from Nasopharyngeal swab specimens and should not be used as a sole basis for treatment. Nasal washings and aspirates are unacceptable for Xpert Xpress SARS-CoV-2/FLU/RSV testing.  Fact Sheet for Patients: BloggerCourse.com  Fact Sheet for Healthcare Providers: SeriousBroker.it  This test is not yet approved or cleared by the [place] FDA and has been authorized for detection and/or diagnosis of SARS-CoV-2 by FDA under an Emergency Use Authorization (EUA). This EUA will remain in effect (meaning this test can be used) for the duration of  the COVID-19 declaration under Section 564(b)(1) of the Act, 21 U.S.C. section 360bbb-3(b)(1), unless the authorization is terminated or revoked.     Resp Syncytial Virus by PCR POSITIVE (A) NEGATIVE Final    Comment: (NOTE) Fact Sheet for Patients: BloggerCourse.com  Fact Sheet for Healthcare Providers: SeriousBroker.it  This test is not yet approved or cleared by the [place] FDA and has been authorized for detection and/or diagnosis of SARS-CoV-2 by FDA under an Emergency Use Authorization (EUA). This EUA will remain in effect (meaning this test can be used) for the duration of the COVID-19 declaration under Section 564(b)(1) of the Act, 21 U.S.C. section 360bbb-3(b)(1), unless the authorization is terminated or revoked.  Performed at [facility], [address] [place], [place] [address]     Coagulation Studies: No results for input(s): "LABPROT", "INR" in the last 72 hours.  Urinalysis: No results for input(s): "COLORURINE", "LABSPEC", "PHURINE", "GLUCOSEU", "HGBUR", "BILIRUBINUR", "KETONESUR", "PROTEINUR", "UROBILINOGEN", "NITRITE", "LEUKOCYTESUR" in the last 72 hours.  Invalid input(s): "APPERANCEUR"    Imaging: ECHOCARDIOGRAM LIMITED  Result Date: 09/07/2022  ECHOCARDIOGRAM LIMITED REPORT   Patient Name:   Bradley [name] Date of Exam: 09/07/2022 Medical Rec #:  [id number]    Height:       72.0 in Accession #:    [id number]   Weight:       194.4 lb Date of Birth:  12/26/1940    BSA:          2.105 m Patient Age:    41 years     BP:           119/63 mmHg Patient Gender: M            HR:           60 bpm. Exam Location:  [facility] Procedure: Limited Echo and Intracardiac Opacification Agent Indications:     CHF I50.21  History:         Patient has prior history of Echocardiogram examinations, most                  recent 08/28/2022.  Sonographer:     Kathlen Brunswick RDCS Referring Phys:  [id number] [place]  Diagnosing Phys: Eleonore Chiquito MD  Sonographer Comments: Technically difficult study due to poor echo windows. Image acquisition challenging due to patient body habitus. IMPRESSIONS  1. Left ventricular ejection fraction, by estimation, is 35 to 40%. The left ventricle has moderately decreased function. The left ventricle demonstrates global hypokinesis. There is moderate asymmetric left ventricular hypertrophy of the basal-septal segment.  2. Right ventricular systolic function is moderately reduced. The right ventricular size is mildly enlarged.  3. The inferior vena cava is dilated in size with <50% respiratory variability, suggesting right atrial pressure of 15 mmHg. Comparison(s): No significant change from prior study. FINDINGS  Left Ventricle: Left ventricular ejection fraction, by estimation, is 35 to 40%. The left ventricle has moderately decreased function. The left ventricle demonstrates global hypokinesis. Definity contrast agent was given IV to delineate the left ventricular endocardial borders. The left ventricular internal cavity size was normal in size. There is moderate asymmetric left ventricular hypertrophy of the basal-septal segment. Right Ventricle: The right ventricular size is mildly enlarged. No increase in right ventricular wall thickness. Right ventricular systolic function is moderately reduced. Pericardium: There is no evidence of pericardial effusion. Venous: The inferior vena cava is dilated in size with less than 50% respiratory variability, suggesting right atrial pressure of 15 mmHg. Additional Comments: There is a small pleural effusion in the left lateral region.  LEFT VENTRICLE PLAX 2D LVIDd:         5.35 cm LVIDs:         4.25 cm LV PW:         1.00 cm LV IVS:        1.15 cm  LV Volumes (MOD) LV vol d, MOD A2C: 140.0 ml LV vol d, MOD A4C: 117.0 ml LV vol s, MOD A2C: 74.9 ml LV vol s, MOD A4C: 65.7 ml LV SV MOD A2C:     65.1 ml LV SV MOD A4C:     117.0 ml LV SV MOD BP:      59.1  ml LEFT ATRIUM         Index LA diam:    4.60 cm 2.19 cm/m Eleonore Chiquito MD Electronically signed by Eleonore Chiquito MD Signature Date/Time: 09/07/2022/5:29:51 PM    Final      Medications:    sodium chloride Stopped (08/24/22 1617)   sodium chloride     cefTRIAXone (ROCEPHIN)  IV 1 g (09/07/22 1646)   furosemide (LASIX) 200 mg in dextrose 5 % 100 mL (2 mg/mL) infusion      amiodarone  100 mg Oral Daily   apixaban  2.5 mg Oral BID   aspirin EC  81 mg Oral Daily   atorvastatin  80 mg Oral QHS   brinzolamide  1 drop Both Eyes TID   And   brimonidine  1 drop Both Eyes TID   Chlorhexidine Gluconate Cloth  6 each Topical Daily   collagenase   Topical Daily   cyanocobalamin  1,000 mcg Oral Daily   doxycycline  100 mg Oral Q12H   hydrALAZINE  10 mg Oral Q8H   insulin aspart  0-5 Units Subcutaneous QHS   insulin aspart  0-9 Units Subcutaneous TID WC   insulin aspart  3 Units Subcutaneous TID WC   isosorbide mononitrate  30 mg Oral Daily   metoprolol succinate  50 mg Oral Daily   mometasone-formoterol  2 puff Inhalation BID   pantoprazole  40 mg Oral Daily   polyethylene glycol  17 g Oral BID   predniSONE  40 mg Oral Q breakfast   timolol  1 drop Both Eyes QHS   Travoprost (BAK Free)  1 drop Both Eyes QHS   sodium chloride, sodium chloride, acetaminophen, albuterol, dextromethorphan-guaiFENesin, ipratropium-albuterol, methocarbamol, nitroGLYCERIN, ondansetron (ZOFRAN) IV, oxyCODONE, sodium chloride flush  Assessment/ Plan:  Bradley [name] is a 82 y.o.  male with past medical conditions of hyperlipidemia, hypertension, chronic systolic heart failure with EF 40 to 45%, PVD, and chronic kidney disease stage III a, who was admitted to [facility] on 08/22/2022 for Acute on chronic combined systolic and diastolic CHF (congestive heart failure) (HCC) [I50.43] Cellulitis of lower extremity, unspecified laterality [A35.573] Acute on chronic congestive heart failure, unspecified heart failure type  ([place]) [I50.9]    Acute kidney injury on chronic kidney disease stage III A.  Baseline GFR appears to be between 35 and 37.  Current GFR 31, has been as low as 29.    Renal function continues to decline.  Will restrict acidosis to monitor urine output.  Due to increased lower extremity edema, will order IV furosemide drip at 3 mg/h.  Will assess patient response in a.m. to determine adjustments.  Discussed with patient the possible need for renal replacement therapy if kidney failure continues to advance.  Patient states he would not want to initiate dialysis and understands consequences if renal failure progresses.  Lab Results  Component Value Date   CREATININE 3.36 (H) 09/08/2022   CREATININE 2.76 (H) 09/07/2022   CREATININE 1.80 (H) 09/06/2022    Intake/Output Summary (Last 24 hours) at 09/08/2022 1053 Last data filed at 09/07/2022 2200 Gross per 24 hour  Intake 100 ml  Output --  Net 100 ml    2. .  Acute on chronic systolic heart failure.  Echo completed on 08/28/2022 shows EF 40 to 45% with grade 2 diastolic dysfunction.  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  Also notes aortic calcification with stenosis.  Cardiology following.  Repeat echo 09/07/2022 shows EF 35 to 40%.  Will order IV furosemide drip as above.    LOS: [place] 1/14/202410:53 AM

## 2022-09-09 ENCOUNTER — Encounter: Payer: No Typology Code available for payment source | Admitting: Family

## 2022-09-09 DIAGNOSIS — I509 Heart failure, unspecified: Secondary | ICD-10-CM | POA: Diagnosis not present

## 2022-09-09 DIAGNOSIS — N1831 Chronic kidney disease, stage 3a: Secondary | ICD-10-CM | POA: Diagnosis not present

## 2022-09-09 DIAGNOSIS — I5043 Acute on chronic combined systolic (congestive) and diastolic (congestive) heart failure: Secondary | ICD-10-CM | POA: Diagnosis not present

## 2022-09-09 DIAGNOSIS — I272 Pulmonary hypertension, unspecified: Secondary | ICD-10-CM | POA: Diagnosis not present

## 2022-09-09 DIAGNOSIS — N170 Acute kidney failure with tubular necrosis: Secondary | ICD-10-CM | POA: Diagnosis not present

## 2022-09-09 LAB — BASIC METABOLIC PANEL
Anion gap: 10 (ref 5–15)
BUN: 99 mg/dL — ABNORMAL HIGH (ref 8–23)
CO2: 24 mmol/L (ref 22–32)
Calcium: 8.1 mg/dL — ABNORMAL LOW (ref 8.9–10.3)
Chloride: 97 mmol/L — ABNORMAL LOW (ref 98–111)
Creatinine, Ser: 2.87 mg/dL — ABNORMAL HIGH (ref 0.61–1.24)
GFR, Estimated: 21 mL/min — ABNORMAL LOW (ref >60)
Glucose, Bld: 248 mg/dL — ABNORMAL HIGH (ref 70–99)
Potassium: 4.8 mmol/L (ref 3.5–5.1)
Sodium: 131 mmol/L — ABNORMAL LOW (ref 135–145)

## 2022-09-09 LAB — GLUCOSE, CAPILLARY
Glucose-Capillary: 192 mg/dL — ABNORMAL HIGH (ref 70–99)
Glucose-Capillary: 159 mg/dL — ABNORMAL HIGH (ref 70–99)
Glucose-Capillary: 197 mg/dL — ABNORMAL HIGH (ref 70–99)
Glucose-Capillary: 142 mg/dL — ABNORMAL HIGH (ref 70–99)

## 2022-09-09 MED ORDER — PREDNISONE 20 MG PO TABS: 20.0000 mg | ORAL_TABLET | Freq: Every day | ORAL | Status: DC

## 2022-09-09 MED ORDER — PREDNISONE 20 MG PO TABS
20.0000 mg | ORAL_TABLET | Freq: Every day | ORAL | Status: DC
  Administered 2022-09-09 – 2022-09-10 (×2): 20 mg via ORAL
  Filled 2022-09-09: qty 1

## 2022-09-09 NOTE — TOC Progression Note (Signed)
Transition of Care [facility]) - Progression Note    Patient Details  Name: Bradley [name] MRN: [id number] Date of Birth: 1941/04/28  Transition of Care [facility]) CM/SW [place], [place] Phone Number: 09/09/2022, 7:29 AM  Clinical Narrative:     TOC continues to follow for discharge planning needs.   Patient has been set up with [facility], per [name] with [facility] they have [place] authorization for Home Health at dc.   Patient will go home to nieces house at discharge [place], [place] [address]   No further dc needs identified at this time.   Expected Discharge Plan: [place] Barriers to Discharge: Continued Medical Work up  Expected Discharge Plan and Services   Discharge Planning Services: CM Consult   Living arrangements for the past 2 months: Single Family Home                                       Social Determinants of Health (SDOH) Interventions SDOH Screenings   Food Insecurity: No Food Insecurity (08/23/2022)  Housing: Low Risk  (08/23/2022)  Transportation Needs: No Transportation Needs (08/23/2022)  Utilities: Not At Risk (08/23/2022)  Tobacco Use: Medium Risk (08/27/2022)    Readmission Risk Interventions     No data to display

## 2022-09-09 NOTE — Progress Notes (Signed)
Progress Note   Patient: Bradley [name] UXL:[id number] DOB: 10-11-1940 DOA: 08/22/2022     18 DOS: the patient was seen and examined on 09/09/2022   Brief hospital course: Bradley [name] is a 82 y.o. 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 Patient stated that he has chronic shortness of breath, which has been progressively worsening in the past several weeks, much worse day of arrival to ED, assoc w/ bilateral leg edema and legs oozing fluid. According to the patient he was sent from the [place] to our hospital with concerns for leg cellulitis. 12/28: pt was found to have BNP > 4500,  WBC 11.1, potassium 5.7, worsening renal function w/ Cr 2.84, VSS 121/54, oxygen saturation 100% on room air.  Chest x-ray showed low volume without infiltration.  Patient was admitted to telemetry bed as inpatient for tx CHF 12/30: Debridement R foot wound w/ podiatry. Vascular surgery planning angiography pending improvementin renal function. 12/31: renal function about same Cr 1.99. Net IO Since Admission: -4,208.77 mL [08/25/22 1516] 01/01: renal fxn improving to Cr 1.78. Angiography planned for tomorrow  01/02: LE angiography w/ stent to R external iliac artery.  1/11.  Patient was found to have RSV positive on 1/8.  Renal function was worse after IV Lasix, but has been better gradually since discontinuation of IV Lasix 3 days ago.       Patient became more short of breath today with some wheezing.  Started steroids and antibiotics. 1/4.  Patient had worsening shortness of breath and hypoxemia on 1/2, received 1 dose of 40 mg IV Lasix.  Since then, renal function is worse again.  Start IV Lasix drip by nephrology.       Assessment and Plan: RSV infection. Patient developed short of breath and wheezing.  He also has a cough, with a white  mucus.  He has a mild elevation of procalcitonin level.  He also has some bronchospasm.  I am concerned that patient may develop secondary bacterial pneumonia.  Will start antibiotics with Rocephin and Zithromax. Also added Solu-Medrol for bronchospasm.  Continue bronchodilator. Chest x-ray showed vascular congestion. Respiratory status has improved, wean down steroids 20 mg prednisone daily.  1 more day of antibiotics to complete the course.   Acute on chronic combined systolic and diastolic CHF (congestive heart failure) (HCC) Moderate pulmonary hypertension Elevated troponin secondary to chf.  2D echo on 03/12/2022 showed EF of 40-45% with grade 2 diastolic dysfunction.  BNP> 4500. --has been diuresed with IV lasix 80 BID per cardio Diuretics on hold due to worsening renal function, currently still on Entresto and metoprolol. Chest x-ray today showed increased pulmonary edema, received 40 mg IV Lasix 1/2.  Renal function much worse.  Received 25 g albumin, creatinine still going up today.  Respiratory status much improved. Entresto on hold. Due to persistent volume overload, cardiologist and nephrology has started Lasix drip.   acute renal failure superimposed on stage 3a chronic kidney disease ([place]):  Hyperkalemia. Baseline creatinine 1.38 on 03/14/2022.   On Admission, Cr 2.84 Patient had a worsening renal function after initial 40 mg IV Lasix, however, after starting IV Lasix drip, renal function gradually improving.  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 --Podiatry -->  saw patient 12/30 and performed excisional debridement of the ulcerative areas on the right forefoot.  Plan: --cont dressing change per order   PVD (peripheral vascular disease) ([place]) S/p LE angiography w/ stent to R external iliac artery on 08/28/22 --pt has  improved perfusion after the stent placement. Continue statin and aspirin.   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  --chest pain morning of 1/3 with increasing trop from 72 to 2572, trending down since. Completed course of heparin drip, continue aspirin and statin.  Followed by cardiology.   Moderate aortic stenosis Recent echo done showed moderate aortic stenosis at [facility] TAVR is not indicated at this time and cardiology is following   Type II diabetes mellitus with renal manifestations [facility]):  Recent A1c 7.8.   Poorly controlled.   Continue current insulin regimen, resume home regimen at discharge.   Paroxysmal atrial fibrillation (HCC) s/p Cardioversion Amiodarone 100 mg po daily and Metoprolol Succinate 50 mg po Daily --cont Eliquis   Normocytic Anemia --No overt bleeding noted monitor CBC      Subjective:  Patient doing well today, no complaints.  Shortness of breath much improved.  Physical Exam: Vitals:   09/08/22 2203 09/09/22 0050 09/09/22 0431 09/09/22 0918  BP: 118/71 (!) 109/52 104/61 127/64  Pulse: (!) 58 (!) 57 (!) 56 62  Resp: 15 20 20 20   Temp: (!) 97.4 F (36.3 C) 97.8 F (36.6 C) 98 F (36.7 C) 97.9 F (36.6 C)  TempSrc: Oral Oral Oral Oral  SpO2: 97% 95% 98% 98%  Weight:      Height:       General exam: Appears calm and comfortable  Respiratory system: Decreased breath sounds with crackles in right base. Respiratory effort normal. Cardiovascular system: S1 & S2 heard, RRR. No JVD, murmurs, rubs, gallops or clicks. 1+ pedal edema. Gastrointestinal system: Abdomen is nondistended, soft and nontender. No organomegaly or masses felt. Normal bowel sounds  heard. Central nervous system: Alert and oriented. No focal neurological deficits. Extremities: Symmetric 5 x 5 power. Skin: No rashes, lesions or ulcers Psychiatry: Judgement and insight appear normal. Mood & affect appropriate.   Data Reviewed:  Lab results reviewed.  Family Communication: Niece updated  Disposition: Status is: Inpatient Remains inpatient appropriate because: Severity of disease, IV treatment.  Planned Discharge Destination: Home with Home Health    Time spent: 35 minutes  Author: Sharen Hones, MD 09/09/2022 11:50 AM  For on call review [email or web address].

## 2022-09-09 NOTE — Progress Notes (Signed)
[facility]  ROUNDING NOTE   Subjective:   Patient seen resting quietly Reports intermittent shortness of breath overnight when laying on left side.  Remains on room air Lower extremity edema slightly improved.  Objective:  Vital signs in last 24 hours:  Temp:  [97.4 F (36.3 C)-98.1 F (36.7 C)] 97.9 F (36.6 C) (01/15 0918) Pulse Rate:  [54-62] 62 (01/15 0918) Resp:  [15-20] 20 (01/15 0918) BP: (102-127)/(52-71) 127/64 (01/15 0918) SpO2:  [95 %-98 %] 98 % (01/15 0918)  Weight change:  Filed Weights   09/06/22 0605 09/07/22 0427 09/08/22 0500  Weight: 88.5 kg 88.2 kg 88.6 kg    Intake/Output: I/O last 3 completed shifts: In: 206.4 [P.O.:100; I.V.:6; IV Piggyback:100.4] Out: 700 [Urine:700]   Intake/Output this shift:  No intake/output data recorded.  Physical Exam: General: NAD  Head: Normocephalic, atraumatic.   Eyes: Anicteric  Lungs:  Clear to auscultation normal effort, room air  Heart: Regular rate and rhythm  Abdomen:  Soft, nontender, nondistended  Extremities: 2+ peripheral edema.  Neurologic: Alert and oriented, moving all four extremities  Skin: No lesions  Access: None    Basic Metabolic Panel: Recent Labs  Lab 09/03/22 0642 09/04/22 0628 09/05/22 0344 09/06/22 0346 09/07/22 0649 09/08/22 0552 09/09/22 0313  NA 135 134* 135 134* 131* 131* 131*  K 4.0 4.5 4.3 4.8 5.3* 4.8 4.8  CL 97* 97* 98 99 96* 98 97*  CO2 30 27 26 23 23 23 24   GLUCOSE 181* 175* 124* 178* 223* 167* 248*  BUN 65* 74* 73* 67* 89* 100* 99*  CREATININE 2.20* 2.17* 2.09* 1.80* 2.76* 3.36* 2.87*  CALCIUM 8.5* 8.7* 8.5* 8.7* 8.7* 8.5* 8.1*  MG 2.3 2.5* 2.5* 2.6* 2.6*  --   --      Liver Function Tests: No results for input(s): "AST", "ALT", "ALKPHOS", "BILITOT", "PROT", "ALBUMIN" in the last 168 hours. No results for input(s): "LIPASE", "AMYLASE" in the last 168 hours. No results for input(s): "AMMONIA" in the last 168 hours.  CBC: Recent Labs  Lab  09/03/22 0642 09/04/22 0628 09/05/22 0344 09/07/22 0649  WBC 6.9 8.0 7.7 8.6  HGB 9.4* 10.3* 10.2* 10.8*  HCT 31.3* 34.2* 33.1* 34.8*  MCV 96.9 96.6 95.1 95.9  PLT 202 243 245 289     Cardiac Enzymes: No results for input(s): "CKTOTAL", "CKMB", "CKMBINDEX", "TROPONINI" in the last 168 hours.  BNP: Invalid input(s): "POCBNP"  CBG: Recent Labs  Lab 09/08/22 0940 09/08/22 1213 09/08/22 1647 09/08/22 2200 09/09/22 0806  GLUCAP 196* 160* 152* 192* 192*     Microbiology: Results for orders placed or performed during the hospital encounter of 08/22/22  Blood culture (routine x 2)     Status: None   Collection Time: 08/22/22  4:20 PM   Specimen: BLOOD  Result Value Ref Range Status   Specimen Description BLOOD BLOOD RIGHT WRIST  Final   Special Requests   Final    BOTTLES DRAWN AEROBIC AND ANAEROBIC Blood Culture adequate volume   Culture   Final    NO GROWTH 5 DAYS Performed at [facility], [address] [place], [place] [address]    Report Status 08/27/2022 FINAL  Final  Blood culture (routine x 2)     Status: None   Collection Time: 08/22/22  4:20 PM   Specimen: BLOOD  Result Value Ref Range Status   Specimen Description BLOOD BLOOD RIGHT FOREARM  Final   Special Requests   Final    BOTTLES DRAWN  AEROBIC AND ANAEROBIC Blood Culture adequate volume   Culture   Final    NO GROWTH 5 DAYS Performed at [facility], [address] [place], [place] [address]    Report Status 08/27/2022 FINAL  Final  Resp panel by RT-PCR (RSV, Flu A&B, Covid)     Status: Abnormal   Collection Time: 09/02/22  4:51 PM  Result Value Ref Range Status   SARS Coronavirus 2 by RT PCR NEGATIVE NEGATIVE Final    Comment: (NOTE) SARS-CoV-2 target nucleic acids are NOT DETECTED.  The SARS-CoV-2 RNA is generally detectable in upper respiratory specimens during the acute phase of infection. The lowest concentration of SARS-CoV-2 viral copies this assay can detect  is 138 copies/mL. A negative result does not preclude SARS-Cov-2 infection and should not be used as the sole basis for treatment or other patient management decisions. A negative result may occur with  improper specimen collection/handling, submission of specimen other than nasopharyngeal swab, presence of viral mutation(s) within the areas targeted by this assay, and inadequate number of viral copies(<138 copies/mL). A negative result must be combined with clinical observations, patient history, and epidemiological information. The expected result is Negative.  Fact Sheet for Patients:  BloggerCourse.com  Fact Sheet for Healthcare Providers:  SeriousBroker.it  This test is no t yet approved or cleared by the [place] FDA and  has been authorized for detection and/or diagnosis of SARS-CoV-2 by FDA under an Emergency Use Authorization (EUA). This EUA will remain  in effect (meaning this test can be used) for the duration of the COVID-19 declaration under Section 564(b)(1) of the Act, 21 U.S.C.section 360bbb-3(b)(1), unless the authorization is terminated  or revoked sooner.       Influenza A by PCR NEGATIVE NEGATIVE Final   Influenza B by PCR NEGATIVE NEGATIVE Final    Comment: (NOTE) The Xpert Xpress SARS-CoV-2/FLU/RSV plus assay is intended as an aid in the diagnosis of influenza from Nasopharyngeal swab specimens and should not be used as a sole basis for treatment. Nasal washings and aspirates are unacceptable for Xpert Xpress SARS-CoV-2/FLU/RSV testing.  Fact Sheet for Patients: BloggerCourse.com  Fact Sheet for Healthcare Providers: SeriousBroker.it  This test is not yet approved or cleared by the [place] FDA and has been authorized for detection and/or diagnosis of SARS-CoV-2 by FDA under an Emergency Use Authorization (EUA). This EUA will remain in effect  (meaning this test can be used) for the duration of the COVID-19 declaration under Section 564(b)(1) of the Act, 21 U.S.C. section 360bbb-3(b)(1), unless the authorization is terminated or revoked.     Resp Syncytial Virus by PCR POSITIVE (A) NEGATIVE Final    Comment: (NOTE) Fact Sheet for Patients: BloggerCourse.com  Fact Sheet for Healthcare Providers: SeriousBroker.it  This test is not yet approved or cleared by the [place] FDA and has been authorized for detection and/or diagnosis of SARS-CoV-2 by FDA under an Emergency Use Authorization (EUA). This EUA will remain in effect (meaning this test can be used) for the duration of the COVID-19 declaration under Section 564(b)(1) of the Act, 21 U.S.C. section 360bbb-3(b)(1), unless the authorization is terminated or revoked.  Performed at [facility], [address] [place], [place] [address]     Coagulation Studies: No results for input(s): "LABPROT", "INR" in the last 72 hours.  Urinalysis: No results for input(s): "COLORURINE", "LABSPEC", "PHURINE", "GLUCOSEU", "HGBUR", "BILIRUBINUR", "KETONESUR", "PROTEINUR", "UROBILINOGEN", "NITRITE", "LEUKOCYTESUR" in the last 72 hours.  Invalid input(s): "APPERANCEUR"    Imaging: ECHOCARDIOGRAM  LIMITED  Result Date: 09/07/2022    ECHOCARDIOGRAM LIMITED REPORT   Patient Name:   Bradley [name] Date of Exam: 09/07/2022 Medical Rec #:  [id number]    Height:       72.0 in Accession #:    [id number]   Weight:       194.4 lb Date of Birth:  [birth year]    BSA:          2.105 m Patient Age:    81 years     BP:           119/63 mmHg Patient Gender: M            HR:           60 bpm. Exam Location:  [facility] Procedure: Limited Echo and Intracardiac Opacification Agent Indications:     CHF I50.21  History:         Patient has prior history of Echocardiogram examinations, most                  recent 08/28/2022.  Sonographer:     Overton Mam RDCS Referring Phys:  [id number] Ronnald Ramp O'NEAL Diagnosing Phys: Lennie Odor MD  Sonographer Comments: Technically difficult study due to poor echo windows. Image acquisition challenging due to patient body habitus. IMPRESSIONS  1. Left ventricular ejection fraction, by estimation, is 35 to 40%. The left ventricle has moderately decreased function. The left ventricle demonstrates global hypokinesis. There is moderate asymmetric left ventricular hypertrophy of the basal-septal segment.  2. Right ventricular systolic function is moderately reduced. The right ventricular size is mildly enlarged.  3. The inferior vena cava is dilated in size with <50% respiratory variability, suggesting right atrial pressure of 15 mmHg. Comparison(s): No significant change from prior study. FINDINGS  Left Ventricle: Left ventricular ejection fraction, by estimation, is 35 to 40%. The left ventricle has moderately decreased function. The left ventricle demonstrates global hypokinesis. Definity contrast agent was given IV to delineate the left ventricular endocardial borders. The left ventricular internal cavity size was normal in size. There is moderate asymmetric left ventricular hypertrophy of the basal-septal segment. Right Ventricle: The right ventricular size is mildly enlarged. No increase in right ventricular wall thickness. Right ventricular systolic function is moderately reduced. Pericardium: There is no evidence of pericardial effusion. Venous: The inferior vena cava is dilated in size with less than 50% respiratory variability, suggesting right atrial pressure of 15 mmHg. Additional Comments: There is a small pleural effusion in the left lateral region.  LEFT VENTRICLE PLAX 2D LVIDd:         5.35 cm LVIDs:         4.25 cm LV PW:         1.00 cm LV IVS:        1.15 cm  LV Volumes (MOD) LV vol d, MOD A2C: 140.0 ml LV vol d, MOD A4C: 117.0 ml LV vol s, MOD A2C: 74.9 ml LV vol s, MOD A4C: 65.7 ml LV SV MOD A2C:      65.1 ml LV SV MOD A4C:     117.0 ml LV SV MOD BP:      59.1 ml LEFT ATRIUM         Index LA diam:    4.60 cm 2.19 cm/m Lennie Odor MD Electronically signed by Lennie Odor MD Signature Date/Time: 09/07/2022/5:29:51 PM    Final      Medications:    sodium chloride Stopped (08/24/22 1617)   sodium  chloride     cefTRIAXone (ROCEPHIN)  IV Stopped (09/08/22 1800)   furosemide (LASIX) 200 mg in dextrose 5 % 100 mL (2 mg/mL) infusion 3 mg/hr (09/08/22 1712)    amiodarone  100 mg Oral Daily   apixaban  2.5 mg Oral BID   aspirin EC  81 mg Oral Daily   atorvastatin  80 mg Oral QHS   brinzolamide  1 drop Both Eyes TID   And   brimonidine  1 drop Both Eyes TID   Chlorhexidine Gluconate Cloth  6 each Topical Daily   collagenase   Topical Daily   cyanocobalamin  1,000 mcg Oral Daily   doxycycline  100 mg Oral Q12H   hydrALAZINE  10 mg Oral Q8H   insulin aspart  0-5 Units Subcutaneous QHS   insulin aspart  0-9 Units Subcutaneous TID WC   insulin aspart  3 Units Subcutaneous TID WC   isosorbide mononitrate  30 mg Oral Daily   metoprolol succinate  50 mg Oral Daily   mometasone-formoterol  2 puff Inhalation BID   pantoprazole  40 mg Oral Daily   polyethylene glycol  17 g Oral BID   predniSONE  40 mg Oral Q breakfast   timolol  1 drop Both Eyes QHS   Travoprost (BAK Free)  1 drop Both Eyes QHS   sodium chloride, sodium chloride, acetaminophen, albuterol, dextromethorphan-guaiFENesin, ipratropium-albuterol, methocarbamol, nitroGLYCERIN, ondansetron (ZOFRAN) IV, oxyCODONE, sodium chloride flush  Assessment/ Plan:  [name] is a 82 y.o.  male with past medical conditions of hyperlipidemia, hypertension, chronic systolic heart failure with EF 40 to 45%, PVD, and chronic kidney disease stage III a, who was admitted to [facility] on 08/22/2022 for Acute on chronic combined systolic and diastolic CHF (congestive heart failure) (HCC) [I50.43] Cellulitis of lower extremity, unspecified laterality  [B28.413] Acute on chronic congestive heart failure, unspecified heart failure type ([place]) [I50.9]    Acute kidney injury on chronic kidney disease stage III A.  Baseline GFR appears to be between 35 and 37.  Current GFR 31, has been as low as 29.    Creatinine has improved. UOP recorded on day shift, 760ml, no output recorded overnight.   Continue strict I & O's. Will continue Furosemide drip for now.  Lab Results  Component Value Date   CREATININE 2.87 (H) 09/09/2022   CREATININE 3.36 (H) 09/08/2022   CREATININE 2.76 (H) 09/07/2022    Intake/Output Summary (Last 24 hours) at 09/09/2022 1022 Last data filed at 09/08/2022 1712 Gross per 24 hour  Intake 106.38 ml  Output 700 ml  Net -593.62 ml    2. .  Acute on chronic systolic heart failure.  Echo completed on 08/28/2022 shows EF 40 to 45% with grade 2 diastolic dysfunction.  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  Also notes aortic calcification with stenosis.  Cardiology following.  Repeat echo 09/07/2022 shows EF 35 to 40%.  Continue Furosemide drip    LOS: 18 Maycel Riffe 1/15/202410:22 AM

## 2022-09-09 NOTE — Progress Notes (Signed)
PT Cancellation Note  Patient Details Name: Bradley [name] MRN: [id number] DOB: 1941/05/05   Cancelled Treatment:    Reason Eval/Treat Not Completed: Fatigue/lethargy limiting ability to participate;Pain limiting ability to participate (Pt asleep on entry, not up for PT at this time. His foot pain is worse. He has been NPO since midnight for upcoming procedure (per pt). Will attempt again at later date/time.)  9:47 AM, 09/09/22 [name], PT, DPT Physical Therapist - [facility]  [phone] ([place])    [place] C 09/09/2022, 9:47 AM

## 2022-09-09 NOTE — Progress Notes (Signed)
Cardiology Progress Note  Patient ID: Bradley [name] MRN: [id number] DOB: 1940-12-04 Date of Encounter: 09/09/2022  Primary Cardiologist: Julien Nordmann, MD  Subjective   Chief Complaint: Shortness of breath  HPI: His creatinine worsened over the weekend to 3.36 but is improved today to 2.87 with continued Lasix drip.  ROS:  All other ROS reviewed and negative. Pertinent positives noted in the HPI.     Inpatient Medications  Scheduled Meds:  amiodarone  100 mg Oral Daily   apixaban  2.5 mg Oral BID   aspirin EC  81 mg Oral Daily   atorvastatin  80 mg Oral QHS   brinzolamide  1 drop Both Eyes TID   And   brimonidine  1 drop Both Eyes TID   Chlorhexidine Gluconate Cloth  6 each Topical Daily   collagenase   Topical Daily   cyanocobalamin  1,000 mcg Oral Daily   doxycycline  100 mg Oral Q12H   hydrALAZINE  10 mg Oral Q8H   insulin aspart  0-5 Units Subcutaneous QHS   insulin aspart  0-9 Units Subcutaneous TID WC   insulin aspart  3 Units Subcutaneous TID WC   isosorbide mononitrate  30 mg Oral Daily   metoprolol succinate  50 mg Oral Daily   mometasone-formoterol  2 puff Inhalation BID   pantoprazole  40 mg Oral Daily   polyethylene glycol  17 g Oral BID   predniSONE  20 mg Oral Q breakfast   timolol  1 drop Both Eyes QHS   Travoprost (BAK Free)  1 drop Both Eyes QHS   Continuous Infusions:  sodium chloride Stopped (08/24/22 1617)   sodium chloride     cefTRIAXone (ROCEPHIN)  IV 1 g (09/09/22 1514)   furosemide (LASIX) 200 mg in dextrose 5 % 100 mL (2 mg/mL) infusion 3 mg/hr (09/08/22 1712)   PRN Meds: sodium chloride, sodium chloride, acetaminophen, albuterol, dextromethorphan-guaiFENesin, ipratropium-albuterol, methocarbamol, nitroGLYCERIN, ondansetron (ZOFRAN) IV, oxyCODONE, sodium chloride flush   Vital Signs   Vitals:   09/09/22 0431 09/09/22 0918 09/09/22 1241 09/09/22 1649  BP: 104/61 127/64 124/62 (!) 105/51  Pulse: (!) 56 62 61 (!) 58  Resp: 20 20 20 16    Temp: 98 F (36.7 C) 97.9 F (36.6 C) 98.3 F (36.8 C) 98.6 F (37 C)  TempSrc: Oral Oral Oral Oral  SpO2: 98% 98% 100% 97%  Weight:      Height:        Intake/Output Summary (Last 24 hours) at 09/09/2022 1833 Last data filed at 09/09/2022 1300 Gross per 24 hour  Intake 240 ml  Output 500 ml  Net -260 ml       09/08/2022    5:00 AM 09/07/2022    4:27 AM 09/06/2022    6:05 AM  Last 3 Weights  Weight (lbs) 195 lb 5.2 oz 194 lb 7.1 oz 195 lb 1.7 oz  Weight (kg) 88.6 kg 88.2 kg 88.5 kg      Telemetry  Overnight telemetry shows sinus rhythm in the 60s, intermittent junctional rhythm, which I personally reviewed.   Physical Exam   Vitals:   09/09/22 0431 09/09/22 0918 09/09/22 1241 09/09/22 1649  BP: 104/61 127/64 124/62 (!) 105/51  Pulse: (!) 56 62 61 (!) 58  Resp: 20 20 20 16   Temp: 98 F (36.7 C) 97.9 F (36.6 C) 98.3 F (36.8 C) 98.6 F (37 C)  TempSrc: Oral Oral Oral Oral  SpO2: 98% 98% 100% 97%  Weight:      Height:  Intake/Output Summary (Last 24 hours) at 09/09/2022 1833 Last data filed at 09/09/2022 1300 Gross per 24 hour  Intake 240 ml  Output 500 ml  Net -260 ml        09/08/2022    5:00 AM 09/07/2022    4:27 AM 09/06/2022    6:05 AM  Last 3 Weights  Weight (lbs) 195 lb 5.2 oz 194 lb 7.1 oz 195 lb 1.7 oz  Weight (kg) 88.6 kg 88.2 kg 88.5 kg    Body mass index is 26.49 kg/m.  General: Well nourished, well developed, in no acute distress Head: Atraumatic, normal size  Eyes: PEERLA, EOMI  Neck: Supple, JVD 12 to 15 cm of water Endocrine: No thryomegaly Cardiac: Normal S1, S2; RRR; no murmurs, rubs, or gallops Lungs: Diminished breath sounds bilaterally Abd: Soft, nontender, no hepatomegaly  Ext: 2+ pitting edema Musculoskeletal: No deformities, BUE and BLE strength normal and equal Skin: Warm and dry, no rashes   Neuro: Alert and oriented to person, place, time, and situation, CNII-XII grossly intact, no focal deficits  Psych: Normal mood  and affect   Labs  High Sensitivity Troponin:   Recent Labs  Lab 08/28/22 1426 08/28/22 2138 08/29/22 0518 09/06/22 0608 09/06/22 0757  TROPONINIHS 2,572* 2,172* 1,216* 60* 54*      Cardiac EnzymesNo results for input(s): "TROPONINI" in the last 168 hours. No results for input(s): "TROPIPOC" in the last 168 hours.  Chemistry Recent Labs  Lab 09/07/22 0649 09/08/22 0552 09/09/22 0313  NA 131* 131* 131*  K 5.3* 4.8 4.8  CL 96* 98 97*  CO2 23 23 24   GLUCOSE 223* 167* 248*  BUN 89* 100* 99*  CREATININE 2.76* 3.36* 2.87*  CALCIUM 8.7* 8.5* 8.1*  GFRNONAA 22* 18* 21*  ANIONGAP 12 10 10      Hematology Recent Labs  Lab 09/04/22 0628 09/05/22 0344 09/07/22 0649  WBC 8.0 7.7 8.6  RBC 3.54* 3.48* 3.63*  HGB 10.3* 10.2* 10.8*  HCT 34.2* 33.1* 34.8*  MCV 96.6 95.1 95.9  MCH 29.1 29.3 29.8  MCHC 30.1 30.8 31.0  RDW 22.2* 22.1* 22.0*  PLT 243 245 289    BNP Recent Labs  Lab 09/05/22 0344  BNP 3,373.6*     DDimer No results for input(s): "DDIMER" in the last 168 hours.   Radiology  No results found.  Cardiac Studies  TTE 09/07/2022  1. Left ventricular ejection fraction, by estimation, is 35 to 40%. The  left ventricle has moderately decreased function. The left ventricle  demonstrates global hypokinesis. There is moderate asymmetric left  ventricular hypertrophy of the basal-septal  segment.   2. Right ventricular systolic function is moderately reduced. The right  ventricular size is mildly enlarged.   3. The inferior vena cava is dilated in size with <50% respiratory  variability, suggesting right atrial pressure of 15 mmHg.   Patient Profile  82 year old male with history of CAD status post CABG, systolic heart failure, moderate aortic stenosis, persistent atrial fibrillation, CKD stage III, hypertension, diabetes, PAD who was admitted on 08/22/2022 for acute on chronic systolic heart failure and acute kidney injury. Course complicated by critical limb  ischemia of the right lower extremity status post right external iliac stenting. Course complicated by RSV and non-STEMI.   Assessment & Plan   # Chest pain #Non-STEMI -Admitted with acute on chronic systolic heart failure.  Course complicated by critical limb ischemia. -Recent cath at [facility] with patent LIMA to LAD, patent RIMA to OM 3, SVG to  RPLV with 99% distal stenosis at the anastomosis.  Not amenable to revascularization.  RCA native CTO. -This has been managed medically.  He has completed 48 hours of heparin.  No further chest pain. -Continue aspirin, Lipitor and Imdur.  He is also on a beta-blocker.  # Acute on chronic systolic heart failure, EF 40-45% # AKI # Persistent volume overload -His echo was repeated over the weekend.  EF still roughly 40%.  He is volume overloaded with RV dysfunction.  His renal function worsened over the weekend but seems to be better this morning with continued diuresis.  Clinically, he continues to seem volume overloaded and thus I recommend continuing diuresis.  If renal function starts to worsen again, consider a right heart catheterization. -Continue metoprolol succinate 50 mg daily, Imdur 30 mg daily.  Hydralazine 10 mg 3 times daily.  I agree with holding Entresto due to worsening renal function. -Not a candidate for ACE/ARB/ARNI/MRA given AKI.  #PAD with critical limb ischemia -Status post right external iliac stenting this admission.  On aspirin and statin.  # Paroxysmal A-fib versus intermittent junctional rhythm   Continue metoprolol and amiodarone.   -On Eliquis.    # RSV -Per hospital medicine    For questions or updates, please contact [place] Please consult [email or web address] for contact info under   Signed, [place] T. Audie Box, MD, [place]  09/09/2022 6:33 PM

## 2022-09-10 DIAGNOSIS — N170 Acute kidney failure with tubular necrosis: Secondary | ICD-10-CM | POA: Diagnosis not present

## 2022-09-10 DIAGNOSIS — I214 Non-ST elevation (NSTEMI) myocardial infarction: Secondary | ICD-10-CM | POA: Diagnosis not present

## 2022-09-10 DIAGNOSIS — I480 Paroxysmal atrial fibrillation: Secondary | ICD-10-CM | POA: Diagnosis not present

## 2022-09-10 DIAGNOSIS — I5043 Acute on chronic combined systolic (congestive) and diastolic (congestive) heart failure: Secondary | ICD-10-CM | POA: Diagnosis not present

## 2022-09-10 DIAGNOSIS — B338 Other specified viral diseases: Secondary | ICD-10-CM | POA: Diagnosis not present

## 2022-09-10 DIAGNOSIS — I5023 Acute on chronic systolic (congestive) heart failure: Secondary | ICD-10-CM | POA: Diagnosis not present

## 2022-09-10 LAB — BASIC METABOLIC PANEL
Anion gap: 9 (ref 5–15)
BUN: 89 mg/dL — ABNORMAL HIGH (ref 8–23)
CO2: 27 mmol/L (ref 22–32)
Calcium: 8.7 mg/dL — ABNORMAL LOW (ref 8.9–10.3)
Chloride: 98 mmol/L (ref 98–111)
Creatinine, Ser: 2.48 mg/dL — ABNORMAL HIGH (ref 0.61–1.24)
GFR, Estimated: 25 mL/min — ABNORMAL LOW (ref >60)
Glucose, Bld: 146 mg/dL — ABNORMAL HIGH (ref 70–99)
Potassium: 4.9 mmol/L (ref 3.5–5.1)
Sodium: 134 mmol/L — ABNORMAL LOW (ref 135–145)

## 2022-09-10 LAB — CBC
HCT: 35.6 % — ABNORMAL LOW (ref 39.0–52.0)
Hemoglobin: 11 g/dL — ABNORMAL LOW (ref 13.0–17.0)
MCH: 29.3 pg (ref 26.0–34.0)
MCHC: 30.9 g/dL (ref 30.0–36.0)
MCV: 94.9 fL (ref 80.0–100.0)
Platelets: 277 10*3/uL (ref 150–400)
RBC: 3.75 MIL/uL — ABNORMAL LOW (ref 4.22–5.81)
RDW: 22 % — ABNORMAL HIGH (ref 11.5–15.5)
WBC: 9.8 10*3/uL (ref 4.0–10.5)
nRBC: 0 % (ref 0.0–0.2)

## 2022-09-10 LAB — GLUCOSE, CAPILLARY
Glucose-Capillary: 134 mg/dL — ABNORMAL HIGH (ref 70–99)
Glucose-Capillary: 147 mg/dL — ABNORMAL HIGH (ref 70–99)
Glucose-Capillary: 267 mg/dL — ABNORMAL HIGH (ref 70–99)
Glucose-Capillary: 245 mg/dL — ABNORMAL HIGH (ref 70–99)

## 2022-09-10 LAB — MAGNESIUM: Magnesium: 2.5 mg/dL — ABNORMAL HIGH (ref 1.7–2.4)

## 2022-09-10 NOTE — Progress Notes (Signed)
[facility]  ROUNDING NOTE   Subjective:   Pat ietn seen laying in bed Alert and oriented States he slept well Denies shortness of breath Lower extremity edema improving  Objective:  Vital signs in last 24 hours:  Temp:  [97.5 F (36.4 C)-98.6 F (37 C)] 98.6 F (37 C) (01/16 0816) Pulse Rate:  [56-61] 60 (01/16 0816) Resp:  [16-20] 18 (01/16 0816) BP: (105-129)/(51-62) 129/60 (01/16 0816) SpO2:  [96 %-100 %] 96 % (01/16 0816) Weight:  [84.1 kg] 84.1 kg (01/16 1008)  Weight change:  Filed Weights   09/07/22 0427 09/08/22 0500 09/10/22 1008  Weight: 88.2 kg 88.6 kg 84.1 kg    Intake/Output: I/O last 3 completed shifts: In: 240 [P.O.:240] Out: 2120 [Urine:2120]   Intake/Output this shift:  No intake/output data recorded.  Physical Exam: General: NAD  Head: Normocephalic, atraumatic.   Eyes: Anicteric  Lungs:  Clear to auscultation normal effort, room air  Heart: Regular rate and rhythm  Abdomen:  Soft, nontender, nondistended  Extremities: 2+ peripheral edema.  Neurologic: Alert and oriented, moving all four extremities  Skin: No lesions  Access: None    Basic Metabolic Panel: Recent Labs  Lab 09/04/22 0628 09/05/22 0344 09/06/22 0346 09/07/22 0649 09/08/22 0552 09/09/22 0313 09/10/22 0515  NA 134* 135 134* 131* 131* 131* 134*  K 4.5 4.3 4.8 5.3* 4.8 4.8 4.9  CL 97* 98 99 96* 98 97* 98  CO2 27 26 23 23 23 24 27   GLUCOSE 175* 124* 178* 223* 167* 248* 146*  BUN 74* 73* 67* 89* 100* 99* 89*  CREATININE 2.17* 2.09* 1.80* 2.76* 3.36* 2.87* 2.48*  CALCIUM 8.7* 8.5* 8.7* 8.7* 8.5* 8.1* 8.7*  MG 2.5* 2.5* 2.6* 2.6*  --   --  2.5*     Liver Function Tests: No results for input(s): "AST", "ALT", "ALKPHOS", "BILITOT", "PROT", "ALBUMIN" in the last 168 hours. No results for input(s): "LIPASE", "AMYLASE" in the last 168 hours. No results for input(s): "AMMONIA" in the last 168 hours.  CBC: Recent Labs  Lab 09/04/22 0628 09/05/22 0344  09/07/22 0649 09/10/22 0515  WBC 8.0 7.7 8.6 9.8  HGB 10.3* 10.2* 10.8* 11.0*  HCT 34.2* 33.1* 34.8* 35.6*  MCV 96.6 95.1 95.9 94.9  PLT 243 245 289 277     Cardiac Enzymes: No results for input(s): "CKTOTAL", "CKMB", "CKMBINDEX", "TROPONINI" in the last 168 hours.  BNP: Invalid input(s): "POCBNP"  CBG: Recent Labs  Lab 09/09/22 0806 09/09/22 1239 09/09/22 1647 09/09/22 2124 09/10/22 0814  GLUCAP 192* 159* 197* 142* 134*     Microbiology: Results for orders placed or performed during the hospital encounter of 08/22/22  Blood culture (routine x 2)     Status: None   Collection Time: 08/22/22  4:20 PM   Specimen: BLOOD  Result Value Ref Range Status   Specimen Description BLOOD BLOOD RIGHT WRIST  Final   Special Requests   Final    BOTTLES DRAWN AEROBIC AND ANAEROBIC Blood Culture adequate volume   Culture   Final    NO GROWTH 5 DAYS Performed at [facility], [address] [place], [place]    Report Status 08/27/2022 FINAL  Final  Blood culture (routine x 2)     Status: None   Collection Time: 08/22/22  4:20 PM   Specimen: BLOOD  Result Value Ref Range Status   Specimen Description BLOOD BLOOD RIGHT FOREARM  Final   Special Requests   Final    BOTTLES  DRAWN AEROBIC AND ANAEROBIC Blood Culture adequate volume   Culture   Final    NO GROWTH 5 DAYS Performed at [facility], [address] [place], [place] [address]    Report Status 08/27/2022 FINAL  Final  Resp panel by RT-PCR (RSV, Flu A&B, Covid)     Status: Abnormal   Collection Time: 09/02/22  4:51 PM  Result Value Ref Range Status   SARS Coronavirus 2 by RT PCR NEGATIVE NEGATIVE Final    Comment: (NOTE) SARS-CoV-2 target nucleic acids are NOT DETECTED.  The SARS-CoV-2 RNA is generally detectable in upper respiratory specimens during the acute phase of infection. The lowest concentration of SARS-CoV-2 viral copies this assay can detect is 138 copies/mL. A negative  result does not preclude SARS-Cov-2 infection and should not be used as the sole basis for treatment or other patient management decisions. A negative result may occur with  improper specimen collection/handling, submission of specimen other than nasopharyngeal swab, presence of viral mutation(s) within the areas targeted by this assay, and inadequate number of viral copies(<138 copies/mL). A negative result must be combined with clinical observations, patient history, and epidemiological information. The expected result is Negative.  Fact Sheet for Patients:  EntrepreneurPulse.com.au  Fact Sheet for Healthcare Providers:  IncredibleEmployment.be  This test is no t yet approved or cleared by the [place] FDA and  has been authorized for detection and/or diagnosis of SARS-CoV-2 by FDA under an Emergency Use Authorization (EUA). This EUA will remain  in effect (meaning this test can be used) for the duration of the COVID-19 declaration under Section 564(b)(1) of the Act, 21 U.S.C.section 360bbb-3(b)(1), unless the authorization is terminated  or revoked sooner.       Influenza A by PCR NEGATIVE NEGATIVE Final   Influenza B by PCR NEGATIVE NEGATIVE Final    Comment: (NOTE) The Xpert Xpress SARS-CoV-2/FLU/RSV plus assay is intended as an aid in the diagnosis of influenza from Nasopharyngeal swab specimens and should not be used as a sole basis for treatment. Nasal washings and aspirates are unacceptable for Xpert Xpress SARS-CoV-2/FLU/RSV testing.  Fact Sheet for Patients: EntrepreneurPulse.com.au  Fact Sheet for Healthcare Providers: IncredibleEmployment.be  This test is not yet approved or cleared by the [place] FDA and has been authorized for detection and/or diagnosis of SARS-CoV-2 by FDA under an Emergency Use Authorization (EUA). This EUA will remain in effect (meaning this test can be used)  for the duration of the COVID-19 declaration under Section 564(b)(1) of the Act, 21 U.S.C. section 360bbb-3(b)(1), unless the authorization is terminated or revoked.     Resp Syncytial Virus by PCR POSITIVE (A) NEGATIVE Final    Comment: (NOTE) Fact Sheet for Patients: EntrepreneurPulse.com.au  Fact Sheet for Healthcare Providers: IncredibleEmployment.be  This test is not yet approved or cleared by the [place] FDA and has been authorized for detection and/or diagnosis of SARS-CoV-2 by FDA under an Emergency Use Authorization (EUA). This EUA will remain in effect (meaning this test can be used) for the duration of the COVID-19 declaration under Section 564(b)(1) of the Act, 21 U.S.C. section 360bbb-3(b)(1), unless the authorization is terminated or revoked.  Performed at [facility], [address] [place], [place] [address]     Coagulation Studies: No results for input(s): "LABPROT", "INR" in the last 72 hours.  Urinalysis: No results for input(s): "COLORURINE", "LABSPEC", "PHURINE", "GLUCOSEU", "HGBUR", "BILIRUBINUR", "KETONESUR", "PROTEINUR", "UROBILINOGEN", "NITRITE", "LEUKOCYTESUR" in the last 72 hours.  Invalid input(s): "APPERANCEUR"    Imaging:  No results found.   Medications:    sodium chloride Stopped (08/24/22 1617)   sodium chloride     cefTRIAXone (ROCEPHIN)  IV 1 g (09/09/22 1514)   furosemide (LASIX) 200 mg in dextrose 5 % 100 mL (2 mg/mL) infusion 3 mg/hr (09/10/22 0448)    amiodarone  100 mg Oral Daily   apixaban  2.5 mg Oral BID   aspirin EC  81 mg Oral Daily   atorvastatin  80 mg Oral QHS   brinzolamide  1 drop Both Eyes TID   And   brimonidine  1 drop Both Eyes TID   Chlorhexidine Gluconate Cloth  6 each Topical Daily   collagenase   Topical Daily   cyanocobalamin  1,000 mcg Oral Daily   hydrALAZINE  10 mg Oral Q8H   insulin aspart  0-5 Units Subcutaneous QHS   insulin aspart  0-9 Units  Subcutaneous TID WC   insulin aspart  3 Units Subcutaneous TID WC   isosorbide mononitrate  30 mg Oral Daily   metoprolol succinate  50 mg Oral Daily   mometasone-formoterol  2 puff Inhalation BID   pantoprazole  40 mg Oral Daily   polyethylene glycol  17 g Oral BID   predniSONE  20 mg Oral Q breakfast   timolol  1 drop Both Eyes QHS   Travoprost (BAK Free)  1 drop Both Eyes QHS   sodium chloride, sodium chloride, acetaminophen, albuterol, dextromethorphan-guaiFENesin, ipratropium-albuterol, methocarbamol, nitroGLYCERIN, ondansetron (ZOFRAN) IV, oxyCODONE, sodium chloride flush  Assessment/ Plan:  [name] is a 82 y.o.  male with past medical conditions of hyperlipidemia, hypertension, chronic systolic heart failure with EF 40 to 45%, PVD, and chronic kidney disease stage III a, who was admitted to [facility] on 08/22/2022 for Acute on chronic combined systolic and diastolic CHF (congestive heart failure) ([place]) [I50.43] Cellulitis of lower extremity, unspecified laterality [V37.106] Acute on chronic congestive heart failure, unspecified heart failure type ([place]) [I50.9]    Acute kidney injury on chronic kidney disease stage III A.  Baseline GFR appears to be between 35 and 37.  Current GFR 31, has been as low as 29.    Renal function continues improving. Adequate urine output 2.1L noted. Will continue to monitor creatinine during diuretic therapy.   Lab Results  Component Value Date   CREATININE 2.48 (H) 09/10/2022   CREATININE 2.87 (H) 09/09/2022   CREATININE 3.36 (H) 09/08/2022    Intake/Output Summary (Last 24 hours) at 09/10/2022 1012 Last data filed at 09/10/2022 0500 Gross per 24 hour  Intake 240 ml  Output 2120 ml  Net -1880 ml    2. .  Acute on chronic systolic heart failure.  Echo completed on 08/28/2022 shows EF 40 to 45% with grade 2 diastolic dysfunction.  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  Also notes aortic calcification with stenosis.  Cardiology following.  Repeat  echo 09/07/2022 shows EF 35 to 40%.  Managing fluid status with furosemide drip.     LOS: Naomi 1/16/202410:12 AM

## 2022-09-10 NOTE — Progress Notes (Signed)
Progress Note   Patient: Bradley [name] KGM:[id number] DOB: 12-02-1940 DOA: 08/22/2022     19 DOS: the patient was seen and examined on 09/10/2022   Brief hospital course: Menashe Kafer is a 82 y.o. 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 Patient stated that he has chronic shortness of breath, which has been progressively worsening in the past several weeks, much worse day of arrival to ED, assoc w/ bilateral leg edema and legs oozing fluid. According to the patient he was sent from the [place] to our hospital with concerns for leg cellulitis. 12/28: pt was found to have BNP > 4500,  WBC 11.1, potassium 5.7, worsening renal function w/ Cr 2.84, VSS 121/54, oxygen saturation 100% on room air.  Chest x-ray showed low volume without infiltration.  Patient was admitted to telemetry bed as inpatient for tx CHF 12/30: Debridement R foot wound w/ podiatry. Vascular surgery planning angiography pending improvementin renal function. 12/31: renal function about same Cr 1.99. Net IO Since Admission: -4,208.77 mL [08/25/22 1516] 01/01: renal fxn improving to Cr 1.78. Angiography planned for tomorrow  01/02: LE angiography w/ stent to R external iliac artery.  1/11.  Patient was found to have RSV positive on 1/8.  Renal function was worse after IV Lasix, but has been better gradually since discontinuation of IV Lasix 3 days ago.       Patient became more short of breath today with some wheezing.  Started steroids and antibiotics. 1/4.  Patient had worsening shortness of breath and hypoxemia on 1/2, received 1 dose of 40 mg IV Lasix.  Since then, renal function is worse again.  Start IV Lasix drip by nephrology.       Assessment and Plan: RSV infection. Patient developed short of breath and wheezing.  He also has a cough, with a white  mucus.  He has a mild elevation of procalcitonin level.  He also has some bronchospasm.  I am concerned that patient may develop secondary bacterial pneumonia.  Will start antibiotics with Rocephin and Zithromax. Also added Solu-Medrol for bronchospasm.  Continue bronchodilator. Chest x-ray showed vascular congestion. Condition much improved, discontinue steroids, completed 5 days antibiotics. He is 8 days post RSV infection, discontinue quarantine.   Acute on chronic combined systolic and diastolic CHF (congestive heart failure) (HCC) Moderate pulmonary hypertension Elevated troponin secondary to chf.  2D echo on 03/12/2022 showed EF of 40-45% with grade 2 diastolic dysfunction.  BNP> 4500. Patient initially was giving IV Lasix, renal function much worse afterwards.  After recurrent Lasix and worsening renal function, patient was finally placed on Lasix drip on 1/14.  Currently feels better.  Function also improving on Lasix.    acute renal failure superimposed on stage 3a chronic kidney disease ([place]):  Hyperkalemia. Baseline creatinine 1.38 on 03/14/2022.   On Admission, Cr 2.84 Renal function continue to improve on Lasix drip.   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 --Podiatry --> saw patient 12/30 and performed excisional debridement of the ulcerative areas on the right forefoot.  Plan: --cont dressing change per order   PVD (peripheral vascular disease) ([place]) S/p LE angiography w/ stent to R external iliac artery on 08/28/22 --pt has improved perfusion after the stent placement. Continue statin and aspirin.  NSTEMI CAD (coronary artery disease status post CABG and prior PCI --underwent coronary angiography at [facility] less than a month ago, showing stable findings of severe native CAD, patent LIMA-LAD and SVG-OM, and patent SVG-R PLA with severe disease at  the distal anastomosis that could could not be intervened upon previously at [facility].  --chest pain morning of 1/3 with increasing trop from 72 to 2572, trending down since. Completed course of heparin drip, continue aspirin and statin.  Followed by cardiology.   Moderate aortic stenosis Recent echo done showed moderate aortic stenosis at [facility] TAVR is not indicated at this time and cardiology is following   Type II diabetes mellitus with renal manifestations [facility]):  Recent A1c 7.8.   Poorly controlled.   Continue current insulin regimen, resume home regimen at discharge.   Paroxysmal atrial fibrillation (HCC) s/p Cardioversion Amiodarone 100 mg po daily and Metoprolol Succinate 50 mg po Daily --cont Eliquis   Normocytic Anemia --No overt bleeding noted monitor CBC         Subjective:  Patient is a much improved today.  No shortness of breath.  Able to ambulate a few steps.  No cough.  Physical Exam: Vitals:   09/10/22 0459 09/10/22 0816 09/10/22 1008 09/10/22 1155  BP: 125/60 129/60  (!) 117/55  Pulse: (!) 56 60  (!) 58  Resp: 18 18  16   Temp: 98.4 F (36.9 C) 98.6 F (37 C)  97.8 F (36.6 C)  TempSrc: Oral Oral  Oral  SpO2: 97% 96%  95%  Weight:   84.1 kg   Height:       General exam: Appears calm and comfortable  Respiratory system: Clear to auscultation. Respiratory effort normal. Cardiovascular system: S1 & S2 heard, RRR. No JVD, murmurs, rubs, gallops or clicks. No pedal edema. Gastrointestinal system: Abdomen is nondistended, soft and nontender. No organomegaly or masses felt. Normal bowel sounds heard. Central nervous system: Alert and oriented. No focal neurological deficits. Extremities: Symmetric 5 x 5 power. Skin: No rashes, lesions or ulcers Psychiatry: Judgement and insight appear normal. Mood & affect appropriate.   Data Reviewed:  Results reviewed.  Family Communication: none  Disposition: Status is: Inpatient Remains inpatient appropriate  because: Severity of disease, IV Lasix.  Planned Discharge Destination: Home with Home Health    Time spent: 35 minutes  Author: Sharen Hones, MD 09/10/2022 12:54 PM  For on call review [email or web address].

## 2022-09-10 NOTE — Progress Notes (Signed)
Rounding Note    Patient Name: Bradley [name] Date of Encounter: 09/10/2022  [place] Cardiologist: Ida Rogue, MD   Subjective   Patient seen on rounds. Walking with PT without difficulty. Currently denies any chest pain or shortness of breath. Serum creatinine improving with 2.48 today on AM labs. -1.8 L output in the last 24 hours. Continued on furosemide drip.  Inpatient Medications    Scheduled Meds:  amiodarone  100 mg Oral Daily   apixaban  2.5 mg Oral BID   aspirin EC  81 mg Oral Daily   atorvastatin  80 mg Oral QHS   brinzolamide  1 drop Both Eyes TID   And   brimonidine  1 drop Both Eyes TID   Chlorhexidine Gluconate Cloth  6 each Topical Daily   collagenase   Topical Daily   cyanocobalamin  1,000 mcg Oral Daily   hydrALAZINE  10 mg Oral Q8H   insulin aspart  0-5 Units Subcutaneous QHS   insulin aspart  0-9 Units Subcutaneous TID WC   insulin aspart  3 Units Subcutaneous TID WC   isosorbide mononitrate  30 mg Oral Daily   metoprolol succinate  50 mg Oral Daily   mometasone-formoterol  2 puff Inhalation BID   pantoprazole  40 mg Oral Daily   polyethylene glycol  17 g Oral BID   timolol  1 drop Both Eyes QHS   Travoprost (BAK Free)  1 drop Both Eyes QHS   Continuous Infusions:  sodium chloride Stopped (08/24/22 1617)   sodium chloride     furosemide (LASIX) 200 mg in dextrose 5 % 100 mL (2 mg/mL) infusion 3 mg/hr (09/10/22 0448)   PRN Meds: sodium chloride, sodium chloride, acetaminophen, albuterol, dextromethorphan-guaiFENesin, ipratropium-albuterol, methocarbamol, nitroGLYCERIN, ondansetron (ZOFRAN) IV, oxyCODONE, sodium chloride flush   Vital Signs    Vitals:   09/10/22 0459 09/10/22 0816 09/10/22 1008 09/10/22 1155  BP: 125/60 129/60  (!) 117/55  Pulse: (!) 56 60  (!) 58  Resp: 18 18  16   Temp: 98.4 F (36.9 C) 98.6 F (37 C)  97.8 F (36.6 C)  TempSrc: Oral Oral  Oral  SpO2: 97% 96%  95%  Weight:   84.1 kg   Height:         Intake/Output Summary (Last 24 hours) at 09/10/2022 1329 Last data filed at 09/10/2022 0500 Gross per 24 hour  Intake --  Output 1620 ml  Net -1620 ml      09/10/2022   10:08 AM 09/08/2022    5:00 AM 09/07/2022    4:27 AM  Last 3 Weights  Weight (lbs) 185 lb 4.8 oz 195 lb 5.2 oz 194 lb 7.1 oz  Weight (kg) 84.052 kg 88.6 kg 88.2 kg      Telemetry    Sinus brady rate in the 50's with large amount of artifact - Personally Reviewed  ECG    No new tracings - Personally Reviewed  Physical Exam   GEN: No acute distress.   Neck: + JVD Cardiac: RRR, no murmurs, rubs, or gallops.  Respiratory: Diminished to auscultation bilaterally.Respirations are unlabored on room air at rest GI: Soft, nontender, non-distended  MS: 1-2+ pitting edema BLE; No deformity. Neuro:  Nonfocal  Psych: Normal affect   Labs    High Sensitivity Troponin:   Recent Labs  Lab 08/28/22 1426 08/28/22 2138 08/29/22 0518 09/06/22 0608 09/06/22 0757  TROPONINIHS 2,572* 2,172* 1,216* 60* 14*     Chemistry Recent Labs  Lab 09/06/22 0346  09/07/22 0649 09/08/22 0552 09/09/22 0313 09/10/22 0515  NA 134* 131* 131* 131* 134*  K 4.8 5.3* 4.8 4.8 4.9  CL 99 96* 98 97* 98  CO2 23 23 23 24 27   GLUCOSE 178* 223* 167* 248* 146*  BUN 67* 89* 100* 99* 89*  CREATININE 1.80* 2.76* 3.36* 2.87* 2.48*  CALCIUM 8.7* 8.7* 8.5* 8.1* 8.7*  MG 2.6* 2.6*  --   --  2.5*  GFRNONAA 37* 22* 18* 21* 25*  ANIONGAP 12 12 10 10 9     Lipids No results for input(s): "CHOL", "TRIG", "HDL", "LABVLDL", "LDLCALC", "CHOLHDL" in the last 168 hours.  Hematology Recent Labs  Lab 09/05/22 0344 09/07/22 0649 09/10/22 0515  WBC 7.7 8.6 9.8  RBC 3.48* 3.63* 3.75*  HGB 10.2* 10.8* 11.0*  HCT 33.1* 34.8* 35.6*  MCV 95.1 95.9 94.9  MCH 29.3 29.8 29.3  MCHC 30.8 31.0 30.9  RDW 22.1* 22.0* 22.0*  PLT 245 289 277   Thyroid No results for input(s): "TSH", "FREET4" in the last 168 hours.  BNP Recent Labs  Lab 09/05/22 0344   BNP 3,373.6*    DDimer No results for input(s): "DDIMER" in the last 168 hours.   Radiology    No results found.  Cardiac Studies   TTE 09/07/2022  1. Left ventricular ejection fraction, by estimation, is 35 to 40%. The  left ventricle has moderately decreased function. The left ventricle  demonstrates global hypokinesis. There is moderate asymmetric left  ventricular hypertrophy of the basal-septal  segment.   2. Right ventricular systolic function is moderately reduced. The right  ventricular size is mildly enlarged.   3. The inferior vena cava is dilated in size with <50% respiratory  variability, suggesting right atrial pressure of 15 mmHg.   Patient Profile     82 y.o. male with a history of CAD s/p CABG, HFrEF, moderate aortic stenosis, persistent atrial fibrillation, CKD stage III, hypertension, diabetes, PAD, who was admitted 08/22/22 for acute on chronic systolic heart failure and acute kidney injury. Hospital course has been complicated by critical limb ischemia of the right lower extremity s/p right external iliac stenting then complicated by RSV and NSTEMI.  Assessment & Plan    1.  Acute on chronic systolic heart failure with a EF 40-45% -Repeat echo revealed EF 40% with RV dysfunction -Continues to appear volume up on exam -Is continued with diuresis on furosemide drip -Daily BMP while receiving diuretics -Continue with Toprol-XL 50 mg daily, Imdur 30 mg daily, hydralazine 10 mg 3 times daily -Entresto remains on hold due to worsening renal function -Not a candidate for ACE/ARB/ARNI/MRA given AKI -Escalate GDMT when allowed by kidney function -Daily weight, I's and O's, low-sodium diet -If his renal function worsens again can consider right heart catheterization to better understand hemodynamics  2.  NSTEMI -Chest pain-free today -Recent cath at [facility] with patent LIMA to LAD, RIMA to OM 3, SVG to RPL V with 99% distal stenosis at the anastomosis, unfortunately  not amendable to revascularization, RCA native CTO. -Continued on aspirin, atorvastatin, Imdur, and Toprol-XL -Previously completed 48 hours of heparin infusion -EGD as needed changes or pain  3.  Paroxysmal atrial fib versus intermittent junctional rhythm -Sinus bradycardia with rate of 50s noted on telemetry -Continued on amiodarone 100 mg daily -Continued on apixaban 2.5 mg twice daily for CHA2DS2-VASc score of at least -Continued on Toprol-XL 50 mg daily -Continuous telemetry monitoring 4.  Acute kidney injury with persistent volume overload -Serum creatinine improved today  down to 2.48 -Continues to remain on furosemide drip --1.8 L output in the last 24 hours -Monitor urine output -Monitor/trend/replete electrolytes as needed -Daily BMP -Avoid nephrotoxic agents were able  5.  PAD with critical limb ischemia -Status post right external iliac stenting -Continued on aspirin and statin  6.  RSV -Management per IM     For questions or updates, please contact [facility] Please consult [email or web address] for contact info under        Signed, Nealy Hickmon, NP  09/10/2022, 1:29 PM

## 2022-09-10 NOTE — Progress Notes (Signed)
Physical Therapy Treatment Patient Details Name: Bradley [name] MRN: [id number] DOB: 1940-10-24 Today's Date: 09/10/2022   History of Present Illness [place] Thielman is an 76yoM who comes to [facility] on 08/22/22 c CP and LEE. PMH: CHF, CAD, DM, HTN, HLD. Pt underwent vascular surgery 08/27/22 c Dr. Delana Meyer, PTA stent Rt EIA. Cariology following 2/2 elevated troponin and angina in the setting of known 3 vessel disease. Pt admitted Dec 6-15 at [facility] back to home alone without any home services, had support from niece, not mobilizing very much due to ongoing pain along Rt heel to lower calf/achilles pain.    PT Comments    Pt seen early in AM, asks for pain medication prior to session, his RLE still painful and de motivating when not medicated. Author returns 1 hour post meds, pt reports no pain at this time. Pt dons his shoes. No physical assist needed in session for any mobility. Pt able to progress AMB to 246ft, perhaps a bit ambitious given his appearance for final 42ft into room, but generally tolerated well, noted safe use of RW. Despite some weakness and dizziness in gait, his SpO2, HR remain WNL. Pt has good understanding of his continued deconditioned state, but without solicitation he offers a plan to regain leg strength and mobility once DC to his niece's home. Discussed plan for pain meds use to optimize mobility tolerance post DC. 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 Will continue to follow.   Recommendations for follow up therapy are one component of a multi-disciplinary discharge planning process, led by the attending physician.  Recommendations may be updated based on patient status, additional functional criteria and insurance authorization.  Follow Up Recommendations  Home health PT     Assistance Recommended at Discharge Set up Supervision/Assistance  Patient can return home with the following A little help with bathing/dressing/bathroom;Assistance with  cooking/housework;Help with stairs or ramp for entrance;Assist for transportation;Direct supervision/assist for financial management;Direct supervision/assist for medications management   Equipment Recommendations  None recommended by PT    Recommendations for Other Services       Precautions / Restrictions Precautions Precautions: Fall Restrictions Weight Bearing Restrictions: No     Mobility  Bed Mobility Overal bed mobility: Modified Independent Bed Mobility: Supine to Sit     Supine to sit: Modified independent (Device/Increase time)          Transfers Overall transfer level: Needs assistance Equipment used: Rolling walker (2 wheels) Transfers: Sit to/from Stand Sit to Stand: Supervision                Ambulation/Gait Ambulation/Gait assistance: Min guard Gait Distance (Feet): 200 Feet Assistive device: Rolling walker (2 wheels) Gait Pattern/deviations: Step-through pattern, WFL(Within Functional Limits) Gait velocity: 0.75m/s   Pre-gait activities: pain meds, donning of diabetic comfort shoes General Gait Details: a bit light headed halfway, takes a 30secx standing recovery, then weak in hips durign final 3feet into room.   Stairs             Wheelchair Mobility    Modified Rankin (Stroke Patients Only)       Balance                                            Cognition Arousal/Alertness: Awake/alert Behavior During Therapy: WFL for tasks assessed/performed Overall Cognitive Status: Within Functional Limits for tasks assessed  Exercises      General Comments        Pertinent Vitals/Pain Pain Assessment Pain Assessment: No/denies pain    Home Living                          Prior Function            PT Goals (current goals can now be found in the care plan section) Acute Rehab PT Goals Patient Stated Goal: better pain control for  foot PT Goal Formulation: With patient Time For Goal Achievement: 09/13/22 Potential to Achieve Goals: Good Progress towards PT goals: Progressing toward goals    Frequency    Min 2X/week      PT Plan Current plan remains appropriate    Co-evaluation              AM-PAC PT "6 Clicks" Mobility   Outcome Measure  Help needed turning from your back to your side while in a flat bed without using bedrails?: None Help needed moving from lying on your back to sitting on the side of a flat bed without using bedrails?: None Help needed moving to and from a bed to a chair (including a wheelchair)?: None Help needed standing up from a chair using your arms (e.g., wheelchair or bedside chair)?: None Help needed to walk in hospital room?: A Little Help needed climbing 3-5 steps with a railing? : A Little 6 Click Score: 22    End of Session Equipment Utilized During Treatment: Gait belt Activity Tolerance: Patient tolerated treatment well;Patient limited by fatigue Patient left: in bed;with call bell/phone within reach;with nursing/sitter in room Nurse Communication: Mobility status PT Visit Diagnosis: Other abnormalities of gait and mobility (R26.89);Unsteadiness on feet (R26.81)     Time: 1100-1119 PT Time Calculation (min) (ACUTE ONLY): 19 min  Charges:  $Therapeutic Activity: 8-22 mins                    1:43 PM, 09/10/22 [name], PT, DPT Physical Therapist - [facility]  [phone] ([place])    Dnya Hickle C 09/10/2022, 1:39 PM

## 2022-09-10 NOTE — Consult Note (Signed)
ADVANCED HEART FAILURE CLINIC NOTE  Referring Physician: No ref. provider found  Primary Care: System, Provider Not In   HPI: Bradley [name] is a 82 y.o. male with HFrEF, HTN, HLD, T2DM, CAD s/p CABG, PAD, paroxysmal atrial fibrillation (DCCV 11/15/21), moderate AS that presented to [facility] on 08/22/22 with complaints of SOB and LE edema which had worsened over the past several weeks. In the ER he was found to have a BNP>4500, K 5.7, sCr of 2.84. He was diuresed with IV lasix with some improvement in renal function initially. On 12/30, he underwent debridement of a R foot wound with LE angiography done on 1/02 with stent placement to the right external iliac artery. On 1/8 due to worsening SOB, he was found to be positive for RSV. Through his hospitalization his renal function has fluctuated with a sCr as high as 3.36 on 09/08/21. He was ultimately started on a lasix infusion at 3mg /hr by nephrology with steady urine output and continued improvement in renal function (2.1 today).   Mr. Kurt was admitted to [facility] from 12/6-12/15 for similar complaints. As per chart review, he was having 1-2 weeks of dyspnea and LE edema. While admitted there he was diuresed with IV lasix and discharged home on metoprolol 50mg  daily, spironolactone 12.5mg  daily, entresto 24/26 BID, and jardiance and lasix 40mg  PO daily. He underwent a LHC that is reported below; essentially stable from prior. He also underwent evaluation for moderate-severe LFLG aortic stenosis with stress echo w/ mean gradient of 11-06-1973. At discharge, his sCr was once more rising to 1.9 (1.6 3 days prior).   From a symptomatic standpoint, Mr. Clevenger reports feeling volume overloaded and short of breath while being discharged recently from [facility].He had 4 pillow orthopnea and PND. Today he reports near complete resolution of these symptoms.      Past Medical History:  Diagnosis Date   (HFpEF) heart failure with preserved ejection fraction (HCC)     Coronary artery disease    Diabetes mellitus without complication (HCC)    Hyperlipidemia    Hypertension     Current Facility-Administered Medications  Medication Dose Route Frequency Provider Last Rate Last Admin   0.9 %  sodium chloride infusion   Intravenous PRN Schnier, , MD   Stopped at 08/24/22 1617   0.9 %  sodium chloride infusion  250 mL Intravenous PRN Schnier, [facility], MD       acetaminophen (TYLENOL) tablet 1,000 mg  1,000 mg Oral TID PRN Latina Craver, MD       albuterol (PROVENTIL) (2.5 MG/3ML) 0.083% nebulizer solution 3 mL  3 mL Nebulization Q4H PRN Schnier, 08/26/22, MD   3 mL at 09/01/22 2228   amiodarone (PACERONE) tablet 100 mg  100 mg Oral Daily Schnier, Latina Craver, MD   100 mg at 09/10/22 0930   apixaban (ELIQUIS) tablet 2.5 mg  2.5 mg Oral BID 2229, NP   2.5 mg at 09/10/22 0930   aspirin EC tablet 81 mg  81 mg Oral Daily End, Christopher, MD   81 mg at 09/10/22 0931   atorvastatin (LIPITOR) tablet 80 mg  80 mg Oral QHS Schnier, Creig Hines, MD   80 mg at 09/09/22 2153   brinzolamide (AZOPT) 1 % ophthalmic suspension 1 drop  1 drop Both Eyes TID Latina Craver, MD   1 drop at 09/10/22 1834   And   brimonidine (ALPHAGAN) 0.2 % ophthalmic solution 1 drop  1 drop Both Eyes TID  Schnier, Dolores Lory, MD   1 drop at 09/10/22 1834   Chlorhexidine Gluconate Cloth 2 % PADS 6 each  6 each Topical Daily Enzo Bi, MD   6 each at 09/10/22 0932   collagenase (SANTYL) ointment   Topical Daily Schnier, Dolores Lory, MD   Given at 09/10/22 4008   cyanocobalamin (VITAMIN B12) tablet 1,000 mcg  1,000 mcg Oral Daily Schnier, Dolores Lory, MD   1,000 mcg at 09/10/22 0930   dextromethorphan-guaiFENesin ([place] DM) 30-600 MG per 12 hr tablet 1 tablet  1 tablet Oral BID PRN Schnier, Dolores Lory, MD       furosemide (LASIX) 200 mg in dextrose 5 % 100 mL (2 mg/mL) infusion  3 mg/hr Intravenous Continuous Breeze, Benancio Deeds, NP 1.5 mL/hr at 09/10/22 0448 3 mg/hr at 09/10/22  0448   hydrALAZINE (APRESOLINE) tablet 10 mg  10 mg Oral Q8H Geralynn Rile, MD   10 mg at 09/10/22 1455   insulin aspart (novoLOG) injection 0-5 Units  0-5 Units Subcutaneous QHS Katha Cabal, MD   2 Units at 09/07/22 2106   insulin aspart (novoLOG) injection 0-9 Units  0-9 Units Subcutaneous TID WC Schnier, Dolores Lory, MD   5 Units at 09/10/22 1834   insulin aspart (novoLOG) injection 3 Units  3 Units Subcutaneous TID WC Enzo Bi, MD   3 Units at 09/10/22 1833   ipratropium-albuterol (DUONEB) 0.5-2.5 (3) MG/3ML nebulizer solution 3 mL  3 mL Nebulization Q4H PRN Sharen Hones, MD       isosorbide mononitrate (IMDUR) 24 hr tablet 30 mg  30 mg Oral Daily Theora Gianotti, NP   30 mg at 09/10/22 0930   methocarbamol (ROBAXIN) tablet 500 mg  500 mg Oral Q6H PRN Sharion Settler, NP   500 mg at 09/08/22 0544   metoprolol succinate (TOPROL-XL) 24 hr tablet 50 mg  50 mg Oral Daily Schnier, Dolores Lory, MD   50 mg at 09/10/22 0932   mometasone-formoterol (DULERA) 200-5 MCG/ACT inhaler 2 puff  2 puff Inhalation BID Sharen Hones, MD   2 puff at 09/10/22 0930   nitroGLYCERIN (NITROSTAT) SL tablet 0.4 mg  0.4 mg Sublingual Q5 Min x 3 PRN Schnier, Dolores Lory, MD   0.4 mg at 09/06/22 0859   ondansetron (ZOFRAN) injection 4 mg  4 mg Intravenous Q6H PRN Schnier, Dolores Lory, MD   4 mg at 09/02/22 6761   oxyCODONE (Oxy IR/ROXICODONE) immediate release tablet 5 mg  5 mg Oral Q4H PRN Enzo Bi, MD   5 mg at 09/10/22 0930   pantoprazole (PROTONIX) EC tablet 40 mg  40 mg Oral Daily Schnier, Dolores Lory, MD   40 mg at 09/10/22 0931   polyethylene glycol (MIRALAX / GLYCOLAX) packet 17 g  17 g Oral BID Enzo Bi, MD   17 g at 09/10/22 0932   sodium chloride flush (NS) 0.9 % injection 3 mL  3 mL Intravenous PRN Schnier, Dolores Lory, MD       timolol (TIMOPTIC) 0.5 % ophthalmic solution 1 drop  1 drop Both Eyes QHS Schnier, Dolores Lory, MD   1 drop at 09/09/22 2158   Travoprost (BAK Free) (TRAVATAN) 0.004 %  ophthalmic solution SOLN 1 drop  1 drop Both Eyes QHS Schnier, Dolores Lory, MD   1 drop at 09/09/22 2158    Allergies  Allergen Reactions   Simvastatin Other (See Comments)    Per [facility] records Per [facility] records  Per [facility] records Per [facility] records  Social History   Socioeconomic History   Marital status: Widowed    Spouse name: Not on file   Number of children: Not on file   Years of education: Not on file   Highest education level: Not on file  Occupational History   Not on file  Tobacco Use   Smoking status: Former   Smokeless tobacco: Never  Vaping Use   Vaping Use: Never used  Substance and Sexual Activity   Alcohol use: Never   Drug use: Never   Sexual activity: Not on file  Other Topics Concern   Not on file  Social History Narrative   Not on file   Social Determinants of Health   Financial Resource Strain: Not on file  Food Insecurity: No Food Insecurity (08/23/2022)   Hunger Vital Sign    Worried About Running Out of Food in the Last Year: Never true    Ran Out of Food in the Last Year: Never true  Transportation Needs: No Transportation Needs (08/23/2022)   PRAPARE - Administrator, Civil Service (Medical): No    Lack of Transportation (Non-Medical): No  Physical Activity: Not on file  Stress: Not on file  Social Connections: Not on file  Intimate Partner Violence: Not At Risk (08/23/2022)   Humiliation, Afraid, Rape, and Kick questionnaire    Fear of Current or Ex-Partner: No    Emotionally Abused: No    Physically Abused: No    Sexually Abused: No      Family History  Problem Relation Age of Onset   CAD Mother    Urinary tract infection Father    Hip fracture Father    Cancer Sister    Cancer Brother     PHYSICAL EXAM: Vitals:   09/10/22 1155 09/10/22 1459  BP: (!) 117/55 (!) 113/49  Pulse: (!) 58 63  Resp: 16 16  Temp: 97.8 F (36.6 C) 97.7 F (36.5 C)  SpO2: 95% 96%   GENERAL: comfortable, NAD; laying flat in bed.   HEENT: Negative for arcus senilis or xanthelasma. There is no scleral icterus.  The mucous membranes are pink and moist.   NECK: Supple, No masses. Normal carotid upstrokes without bruits. No masses or thyromegaly.    CHEST: There are no chest wall deformities. There is no chest wall tenderness. Respirations are unlabored.  Lungs- decreased at bases with mild crackles bilaterally CARDIAC:  JVP: elevated to midneck         Normal S1, S2  Normal rate with regular rhythm. No murmurs, rubs or gallops.  Pulses are 2+ and symmetrical in upper and lower extremities. 1+ pitting edema.  ABDOMEN: Soft, non-tender, non-distended. There are no masses or hepatomegaly. There are normal bowel sounds.  EXTREMITIES: Warm and well perfused with no cyanosis, clubbing.  LYMPHATIC: No axillary or supraclavicular lymphadenopathy.  NEUROLOGIC: Patient is oriented x3 with no focal or lateralizing neurologic deficits.  PSYCH: Patients affect is appropriate, there is no evidence of anxiety or depression.  SKIN: Warm and dry; no lesions or wounds.   DATA REVIEW  ECG:09/06/22: No P waves; junctional rhythm v sinus rhythm with 1AVB  ECHO: 09/07/22: LVEF 35-40%, RV function moderately reduced.  08/28/22: LVEF 40-45%,normal RV function; PASP 03/22/22: LVEF 40-45%, RV function mildly reduced  CATH: 07/10/21 Severe native coronary artery disease with 90% distal LMCA stenosis, diffuse proximal LAD disease with functional mid vessel occlusion with competitive flow from LIMA-LAD, 99% proximal OM1 stenosis, and CTO of proximal RCA. Widely patent  LIMA-LAD and SVG-OM3. Patent SVG-RPDA with 70% stenosis near distal anastomosis.  RPDA distal to SVG anastomosis is occluded (? culprit lesion for NSTEMI). Mildly elevated left ventricular filling pressure (LVEDP 15-20 mmHg). Mild-moderate aortic valve stenosis (peak-to-peak gradient ~20 mmHg).  LHC, [facility], 09/13/21:  99% lesion noted at the distal anastomosis of the SVG to  distal RCA/PL. JR4 guide, and multiple wires would not cross ([employer], runthrough, pilot 50, fielder XT). Used corsair to provide support, and was not able to cross distal lesion given severe bend in  SVG to native vessel.    ASSESSMENT & PLAN:  Acute on chronic systolic heart failure exacerbation  - TTE on 09/07/22 with LVEF 35%-40% - On exam today, patient is still hypervolemic with JVP to the midneck and 1+ pitting edema; however, sCr continuing to improve with lasix gtt. Will increase to 5mg /hr.  - Continue hydralazine 10mg  TID, imdur 30mg , toprol 50mg  daily.  - Patient will need a RHC in the next 24-48H.  - Plan to start SGLT2I / ARB prior to discharge for CKD & HFrEF indications.   2. AKI on CKD  - Baseline sCr of 1.38 on 03/14/22; up to 2.8 on admission  - Reviewed [facility] records; sCr at low as 1.5 there, however, 1.9 at discharge (08/09/22) - Net negative 11L since admission  - Continue lasix gtt at 5mg /hr.   3. CAD s/p CABG  - Angiography at [facility] w/ patent LIMA-LAD, SVG-OM and severe SVG-R PLV disease not amenable to PCI; otherwise, severe native vessel CAD that was stable.   4. Paroxysmal atrial fibrillation  - amiodarone 100mg  daily, metop succinate 50mg  and apixaban  5. Moderate aortic stenosis - Evaluated recently at [facility] for low flow low gradient; dobutamine stress echo negative for LFLG. Continue outpatient monitoring.   4. T2DM - A1C 7.8 - Followed by internal medicine    Lottie Sigman Advanced Heart Failure Mechanical Circulatory Support

## 2022-09-11 DIAGNOSIS — I480 Paroxysmal atrial fibrillation: Secondary | ICD-10-CM | POA: Diagnosis not present

## 2022-09-11 DIAGNOSIS — I5043 Acute on chronic combined systolic (congestive) and diastolic (congestive) heart failure: Secondary | ICD-10-CM | POA: Diagnosis not present

## 2022-09-11 DIAGNOSIS — I10 Essential (primary) hypertension: Secondary | ICD-10-CM | POA: Diagnosis not present

## 2022-09-11 DIAGNOSIS — B338 Other specified viral diseases: Secondary | ICD-10-CM | POA: Diagnosis not present

## 2022-09-11 LAB — BASIC METABOLIC PANEL
Anion gap: 10 (ref 5–15)
BUN: 85 mg/dL — ABNORMAL HIGH (ref 8–23)
CO2: 25 mmol/L (ref 22–32)
Calcium: 8.8 mg/dL — ABNORMAL LOW (ref 8.9–10.3)
Chloride: 100 mmol/L (ref 98–111)
Creatinine, Ser: 2.1 mg/dL — ABNORMAL HIGH (ref 0.61–1.24)
GFR, Estimated: 31 mL/min — ABNORMAL LOW (ref >60)
Glucose, Bld: 144 mg/dL — ABNORMAL HIGH (ref 70–99)
Potassium: 4.3 mmol/L (ref 3.5–5.1)
Sodium: 135 mmol/L (ref 135–145)

## 2022-09-11 LAB — CBC
HCT: 36.9 % — ABNORMAL LOW (ref 39.0–52.0)
Hemoglobin: 11.6 g/dL — ABNORMAL LOW (ref 13.0–17.0)
MCH: 29.9 pg (ref 26.0–34.0)
MCHC: 31.4 g/dL (ref 30.0–36.0)
MCV: 95.1 fL (ref 80.0–100.0)
Platelets: 269 10*3/uL (ref 150–400)
RBC: 3.88 MIL/uL — ABNORMAL LOW (ref 4.22–5.81)
RDW: 22 % — ABNORMAL HIGH (ref 11.5–15.5)
WBC: 8.6 10*3/uL (ref 4.0–10.5)
nRBC: 0 % (ref 0.0–0.2)

## 2022-09-11 LAB — GLUCOSE, CAPILLARY
Glucose-Capillary: 141 mg/dL — ABNORMAL HIGH (ref 70–99)
Glucose-Capillary: 177 mg/dL — ABNORMAL HIGH (ref 70–99)
Glucose-Capillary: 238 mg/dL — ABNORMAL HIGH (ref 70–99)
Glucose-Capillary: 238 mg/dL — ABNORMAL HIGH (ref 70–99)

## 2022-09-11 LAB — TROPONIN I (HIGH SENSITIVITY): Troponin I (High Sensitivity): 748 ng/L (ref <18)

## 2022-09-11 LAB — MAGNESIUM: Magnesium: 2.5 mg/dL — ABNORMAL HIGH (ref 1.7–2.4)

## 2022-09-11 NOTE — Progress Notes (Signed)
Progress Note   Patient: Bradley [name] JXB:[id number] DOB: Oct 01, 1940 DOA: 08/22/2022     20 DOS: the patient was seen and examined on 09/11/2022   Brief hospital course: 82 y.o. male with past medical history significant of systolic CHF with EF of 13-08%, hypertension, diabetes mellitus, CAD with CABG and PVD, who presented to ED on 08/22/22 with shortness of breath and worsening leg edema.  Patient admitted for acute systolic heart failure.    Following admission, patient underwent debridement of his right foot by podiatry.  Patient's renal function worsened and nephrology consulted.  Felt to be secondary to decompensated heart failure despite doses of IV Lasix and started on Lasix drip.  Hospital course also complicated by development of RSV on 1/8.  Since hospitalization, patient has diuresed almost 7 L and is more than -5.5 L deficient and is down over 8 pounds from admission.       Assessment and Plan: RSV infection and bronchitis-resolved. Supportive treatment.  Placed on antibiotics due to elevated procalcitonin level.  On steroids and bronchodilators.  Now back to baseline.   Acute on chronic combined systolic and diastolic CHF (congestive heart failure) (HCC) Moderate pulmonary hypertension Elevated troponin secondary to chf.  2D echo on 03/12/2022 showed EF of 40-45% with grade 2 diastolic dysfunction.  BNP> 4500. Patient initially was giving IV Lasix, renal function much worse afterwards.  After recurrent Lasix and worsening renal function, patient was finally placed on Lasix drip on 1/14.  Currently feels better.  Function also improving on Lasix.  Some lightheadedness so we will check orthostatics.    acute renal failure superimposed on stage 3a chronic kidney disease ([place]):  Hyperkalemia. Baseline creatinine 1.38 on 03/14/2022.   On Admission, Cr 2.84 Down to 2.1 today.   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 --Podiatry --> saw patient 12/30 and performed excisional debridement of the ulcerative areas on the right forefoot.  Plan: --cont dressing change per order   PVD (peripheral vascular disease) ([place]) S/p LE angiography w/ stent to R external iliac artery on 08/28/22 --pt has improved perfusion after the stent placement. Continue statin and aspirin.   NSTEMI CAD (coronary artery disease status post CABG and prior PCI --underwent coronary angiography at [place] less than a month ago, showing stable findings of severe native CAD, patent LIMA-LAD and SVG-OM, and patent SVG-R PLA with severe disease at the distal anastomosis that could could not be intervened upon previously at [facility].  --chest pain morning of 1/3 with increasing trop from 72 to 2572, trending down since. Completed course of heparin drip, continue aspirin and statin.  Followed by cardiology. Had some chest pain today, will recheck troponin.   Moderate aortic stenosis Recent echo done showed moderate aortic stenosis at [facility] TAVR is not indicated at this time and cardiology is following   Type II diabetes mellitus with renal manifestations [facility]):  Recent A1c 7.8.   Poorly controlled.   Continue current insulin regimen, resume home regimen at discharge.   Paroxysmal atrial fibrillation (HCC) s/p Cardioversion Amiodarone 100 mg po daily and Metoprolol Succinate 50 mg po Daily --cont Eliquis   Normocytic Anemia --No overt bleeding noted monitor CBC         Subjective:  Patient felt a little lightheaded with some chest discomfort when he got up today.  Physical Exam: Vitals:   09/11/22 0742 09/11/22 1129 09/11/22 1131 09/11/22 1133  BP: 126/63 (!) 113/52 (!) 122/57 134/60  Pulse: (!) 56 61 64 71  Resp: 18     Temp: (!) 97.5 F (36.4 C)     TempSrc: Oral     SpO2: 96% 98% 99% 96%  Weight:    81.9 kg  Height:       General exam: Alert  and oriented x 3 Respiratory system: Clear to auscultation bilaterally Cardiovascular system: Regular rate and rhythm, S1-S2, occasional ectopic beat Gastrointestinal system: Soft, nontender, distended, positive bowel sounds Central nervous system: No focal deficits Extremities: No clubbing or cyanosis or edema Skin: No rashes, lesions or ulcers Psychiatry: Appropriate, no evidence of psychoses  Data Reviewed: Creatinine at 2.1.  Family Communication: none  Disposition: Status is: Inpatient Remains inpatient appropriate because: IV Lasix.  Planned Discharge Destination: Home with Home Health     Author: Annita Brod, MD 09/11/2022 1:49 PM  For on call review [email or web address].

## 2022-09-11 NOTE — Progress Notes (Signed)
Shared Decision Making/Informed Consent The risks [stroke (1 in 1000), death (1 in 1000), bleeding (1 in 200), allergic reaction [possibly serious] (1 in 200)], benefits (diagnostic support and management of coronary artery disease) and alternatives of a cardiac catheterization were discussed in detail with Bradley [name] and he is willing to proceed.  Patient is scheduled for RHC tomorrow with Dr Ellyn Hack.

## 2022-09-11 NOTE — Progress Notes (Signed)
[facility]  ROUNDING NOTE   Subjective:   Patient seen sitting at side of bed Remains on room air, denies shortness of breath States he feels well today Lower extremity edema continues to improve.  Objective:  Vital signs in last 24 hours:  Temp:  [97.5 F (36.4 C)-98.3 F (36.8 C)] 97.5 F (36.4 C) (01/17 0742) Pulse Rate:  [51-63] 56 (01/17 0742) Resp:  [16-20] 18 (01/17 0742) BP: (113-129)/(49-67) 126/63 (01/17 0742) SpO2:  [95 %-98 %] 96 % (01/17 0742) Weight:  [84.1 kg] 84.1 kg (01/16 1008)  Weight change:  Filed Weights   09/07/22 0427 09/08/22 0500 09/10/22 1008  Weight: 88.2 kg 88.6 kg 84.1 kg    Intake/Output: I/O last 3 completed shifts: In: 67.6 [I.V.:67.6] Out: 3000 [Urine:3000]   Intake/Output this shift:  Total I/O In: -  Out: 500 [Urine:500]  Physical Exam: General: NAD  Head: Normocephalic, atraumatic.   Eyes: Anicteric  Lungs:  Clear to auscultation normal effort, room air  Heart: Regular rate and rhythm  Abdomen:  Soft, nontender, nondistended  Extremities: 1-2+ peripheral edema.  Neurologic: Alert and oriented, moving all four extremities  Skin: No lesions  Access: None    Basic Metabolic Panel: Recent Labs  Lab 09/05/22 0344 09/06/22 0346 09/07/22 0649 09/08/22 0552 09/09/22 0313 09/10/22 0515 09/11/22 0513  NA 135 134* 131* 131* 131* 134* 135  K 4.3 4.8 5.3* 4.8 4.8 4.9 4.3  CL 98 99 96* 98 97* 98 100  CO2 26 23 23 23 24 27 25   GLUCOSE 124* 178* 223* 167* 248* 146* 144*  BUN 73* 67* 89* 100* 99* 89* 85*  CREATININE 2.09* 1.80* 2.76* 3.36* 2.87* 2.48* 2.10*  CALCIUM 8.5* 8.7* 8.7* 8.5* 8.1* 8.7* 8.8*  MG 2.5* 2.6* 2.6*  --   --  2.5* 2.5*     Liver Function Tests: No results for input(s): "AST", "ALT", "ALKPHOS", "BILITOT", "PROT", "ALBUMIN" in the last 168 hours. No results for input(s): "LIPASE", "AMYLASE" in the last 168 hours. No results for input(s): "AMMONIA" in the last 168 hours.  CBC: Recent  Labs  Lab 09/05/22 0344 09/07/22 0649 09/10/22 0515 09/11/22 0513  WBC 7.7 8.6 9.8 8.6  HGB 10.2* 10.8* 11.0* 11.6*  HCT 33.1* 34.8* 35.6* 36.9*  MCV 95.1 95.9 94.9 95.1  PLT 245 289 277 269     Cardiac Enzymes: No results for input(s): "CKTOTAL", "CKMB", "CKMBINDEX", "TROPONINI" in the last 168 hours.  BNP: Invalid input(s): "POCBNP"  CBG: Recent Labs  Lab 09/10/22 0814 09/10/22 1154 09/10/22 1801 09/10/22 2118 09/11/22 0742  GLUCAP 134* 147* 267* 245* 141*     Microbiology: Results for orders placed or performed during the hospital encounter of 08/22/22  Blood culture (routine x 2)     Status: None   Collection Time: 08/22/22  4:20 PM   Specimen: BLOOD  Result Value Ref Range Status   Specimen Description BLOOD BLOOD RIGHT WRIST  Final   Special Requests   Final    BOTTLES DRAWN AEROBIC AND ANAEROBIC Blood Culture adequate volume   Culture   Final    NO GROWTH 5 DAYS Performed at [facility], [address], [place], [place] [address]    Report Status 08/27/2022 FINAL  Final  Blood culture (routine x 2)     Status: None   Collection Time: 08/22/22  4:20 PM   Specimen: BLOOD  Result Value Ref Range Status   Specimen Description BLOOD BLOOD RIGHT FOREARM  Final  Special Requests   Final    BOTTLES DRAWN AEROBIC AND ANAEROBIC Blood Culture adequate volume   Culture   Final    NO GROWTH 5 DAYS Performed at [facility], [address] [place], [place] [address]    Report Status 08/27/2022 FINAL  Final  Resp panel by RT-PCR (RSV, Flu A&B, Covid)     Status: Abnormal   Collection Time: 09/02/22  4:51 PM  Result Value Ref Range Status   SARS Coronavirus 2 by RT PCR NEGATIVE NEGATIVE Final    Comment: (NOTE) SARS-CoV-2 target nucleic acids are NOT DETECTED.  The SARS-CoV-2 RNA is generally detectable in upper respiratory specimens during the acute phase of infection. The lowest concentration of SARS-CoV-2 viral copies this assay can  detect is 138 copies/mL. A negative result does not preclude SARS-Cov-2 infection and should not be used as the sole basis for treatment or other patient management decisions. A negative result may occur with  improper specimen collection/handling, submission of specimen other than nasopharyngeal swab, presence of viral mutation(s) within the areas targeted by this assay, and inadequate number of viral copies(<138 copies/mL). A negative result must be combined with clinical observations, patient history, and epidemiological information. The expected result is Negative.  Fact Sheet for Patients:  EntrepreneurPulse.com.au  Fact Sheet for Healthcare Providers:  IncredibleEmployment.be  This test is no t yet approved or cleared by the [place] FDA and  has been authorized for detection and/or diagnosis of SARS-CoV-2 by FDA under an Emergency Use Authorization (EUA). This EUA will remain  in effect (meaning this test can be used) for the duration of the COVID-19 declaration under Section 564(b)(1) of the Act, 21 U.S.C.section 360bbb-3(b)(1), unless the authorization is terminated  or revoked sooner.       Influenza A by PCR NEGATIVE NEGATIVE Final   Influenza B by PCR NEGATIVE NEGATIVE Final    Comment: (NOTE) The Xpert Xpress SARS-CoV-2/FLU/RSV plus assay is intended as an aid in the diagnosis of influenza from Nasopharyngeal swab specimens and should not be used as a sole basis for treatment. Nasal washings and aspirates are unacceptable for Xpert Xpress SARS-CoV-2/FLU/RSV testing.  Fact Sheet for Patients: EntrepreneurPulse.com.au  Fact Sheet for Healthcare Providers: IncredibleEmployment.be  This test is not yet approved or cleared by the [place] FDA and has been authorized for detection and/or diagnosis of SARS-CoV-2 by FDA under an Emergency Use Authorization (EUA). This EUA will remain in  effect (meaning this test can be used) for the duration of the COVID-19 declaration under Section 564(b)(1) of the Act, 21 U.S.C. section 360bbb-3(b)(1), unless the authorization is terminated or revoked.     Resp Syncytial Virus by PCR POSITIVE (A) NEGATIVE Final    Comment: (NOTE) Fact Sheet for Patients: EntrepreneurPulse.com.au  Fact Sheet for Healthcare Providers: IncredibleEmployment.be  This test is not yet approved or cleared by the [place] FDA and has been authorized for detection and/or diagnosis of SARS-CoV-2 by FDA under an Emergency Use Authorization (EUA). This EUA will remain in effect (meaning this test can be used) for the duration of the COVID-19 declaration under Section 564(b)(1) of the Act, 21 U.S.C. section 360bbb-3(b)(1), unless the authorization is terminated or revoked.  Performed at [facility], [address] [place], [place] [address]     Coagulation Studies: No results for input(s): "LABPROT", "INR" in the last 72 hours.  Urinalysis: No results for input(s): "COLORURINE", "LABSPEC", "PHURINE", "GLUCOSEU", "HGBUR", "BILIRUBINUR", "KETONESUR", "PROTEINUR", "UROBILINOGEN", "NITRITE", "LEUKOCYTESUR" in the last 72  hours.  Invalid input(s): "APPERANCEUR"    Imaging: No results found.   Medications:    sodium chloride Stopped (08/24/22 1617)   sodium chloride     furosemide (LASIX) 200 mg in dextrose 5 % 100 mL (2 mg/mL) infusion 3 mg/hr (09/10/22 0448)    amiodarone  100 mg Oral Daily   apixaban  2.5 mg Oral BID   aspirin EC  81 mg Oral Daily   atorvastatin  80 mg Oral QHS   brinzolamide  1 drop Both Eyes TID   And   brimonidine  1 drop Both Eyes TID   Chlorhexidine Gluconate Cloth  6 each Topical Daily   collagenase   Topical Daily   cyanocobalamin  1,000 mcg Oral Daily   hydrALAZINE  10 mg Oral Q8H   insulin aspart  0-5 Units Subcutaneous QHS   insulin aspart  0-9 Units  Subcutaneous TID WC   insulin aspart  3 Units Subcutaneous TID WC   isosorbide mononitrate  30 mg Oral Daily   metoprolol succinate  50 mg Oral Daily   mometasone-formoterol  2 puff Inhalation BID   pantoprazole  40 mg Oral Daily   polyethylene glycol  17 g Oral BID   timolol  1 drop Both Eyes QHS   Travoprost (BAK Free)  1 drop Both Eyes QHS   sodium chloride, sodium chloride, acetaminophen, albuterol, dextromethorphan-guaiFENesin, ipratropium-albuterol, methocarbamol, nitroGLYCERIN, ondansetron (ZOFRAN) IV, oxyCODONE, sodium chloride flush  Assessment/ Plan:  Bradley [name] is a 82 y.o.  male with past medical conditions of hyperlipidemia, hypertension, chronic systolic heart failure with EF 40 to 45%, PVD, and chronic kidney disease stage III a, who was admitted to [facility] on 08/22/2022 for Acute on chronic combined systolic and diastolic CHF (congestive heart failure) (HCC) [I50.43] Cellulitis of lower extremity, unspecified laterality [L03.119] Acute on chronic congestive heart failure, unspecified heart failure type (HCC) [I50.9]    Acute kidney injury on chronic kidney disease stage III A.  Baseline GFR appears to be between 35 and 37.  Current GFR 31, has been as low as 29.    Creatinine continues to improve, 1.8L in urine output recorded. Will continue to monitor diuresis.   Lab Results  Component Value Date   CREATININE 2.10 (H) 09/11/2022   CREATININE 2.48 (H) 09/10/2022   CREATININE 2.87 (H) 09/09/2022    Intake/Output Summary (Last 24 hours) at 09/11/2022 0944 Last data filed at 09/11/2022 0934 Gross per 24 hour  Intake 67.55 ml  Output 1700 ml  Net -1632.45 ml    2. .  Acute on chronic systolic heart failure.  Echo completed on 08/28/2022 shows EF 40 to 45% with grade 2 diastolic dysfunction.  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  Also notes aortic calcification with stenosis.  Cardiology following.  Repeat echo 09/07/2022 shows EF 35 to 40%.  Continue Furosemide  drip    LOS: 20 Tyrese Ficek 1/17/20249:44 AM

## 2022-09-12 ENCOUNTER — Encounter
Admission: EM | Disposition: A | Discharge status: Home Health | Payer: Self-pay | Source: Home / Self Care | Attending: Internal Medicine

## 2022-09-12 DIAGNOSIS — N170 Acute kidney failure with tubular necrosis: Secondary | ICD-10-CM | POA: Diagnosis not present

## 2022-09-12 DIAGNOSIS — I5043 Acute on chronic combined systolic (congestive) and diastolic (congestive) heart failure: Secondary | ICD-10-CM | POA: Diagnosis not present

## 2022-09-12 DIAGNOSIS — I214 Non-ST elevation (NSTEMI) myocardial infarction: Secondary | ICD-10-CM | POA: Diagnosis not present

## 2022-09-12 DIAGNOSIS — I480 Paroxysmal atrial fibrillation: Secondary | ICD-10-CM | POA: Diagnosis not present

## 2022-09-12 DIAGNOSIS — I10 Essential (primary) hypertension: Secondary | ICD-10-CM | POA: Diagnosis not present

## 2022-09-12 HISTORY — PX: RIGHT HEART CATH: CATH118263

## 2022-09-12 LAB — BASIC METABOLIC PANEL
Anion gap: 10 (ref 5–15)
BUN: 73 mg/dL — ABNORMAL HIGH (ref 8–23)
CO2: 28 mmol/L (ref 22–32)
Calcium: 8.4 mg/dL — ABNORMAL LOW (ref 8.9–10.3)
Chloride: 101 mmol/L (ref 98–111)
Creatinine, Ser: 1.97 mg/dL — ABNORMAL HIGH (ref 0.61–1.24)
GFR, Estimated: 34 mL/min — ABNORMAL LOW (ref >60)
Glucose, Bld: 153 mg/dL — ABNORMAL HIGH (ref 70–99)
Potassium: 3.5 mmol/L (ref 3.5–5.1)
Sodium: 139 mmol/L (ref 135–145)

## 2022-09-12 LAB — CBC
HCT: 34.4 % — ABNORMAL LOW (ref 39.0–52.0)
Hemoglobin: 10.6 g/dL — ABNORMAL LOW (ref 13.0–17.0)
MCH: 29 pg (ref 26.0–34.0)
MCHC: 30.8 g/dL (ref 30.0–36.0)
MCV: 94 fL (ref 80.0–100.0)
Platelets: 246 10*3/uL (ref 150–400)
RBC: 3.66 MIL/uL — ABNORMAL LOW (ref 4.22–5.81)
RDW: 21.8 % — ABNORMAL HIGH (ref 11.5–15.5)
WBC: 8.5 10*3/uL (ref 4.0–10.5)
nRBC: 0 % (ref 0.0–0.2)

## 2022-09-12 LAB — GLUCOSE, CAPILLARY
Glucose-Capillary: 146 mg/dL — ABNORMAL HIGH (ref 70–99)
Glucose-Capillary: 185 mg/dL — ABNORMAL HIGH (ref 70–99)
Glucose-Capillary: 161 mg/dL — ABNORMAL HIGH (ref 70–99)
Glucose-Capillary: 125 mg/dL — ABNORMAL HIGH (ref 70–99)
Glucose-Capillary: 256 mg/dL — ABNORMAL HIGH (ref 70–99)

## 2022-09-12 LAB — POCT I-STAT EG7
Acid-Base Excess: 5 mmol/L — ABNORMAL HIGH (ref 0.0–2.0)
Bicarbonate: 30.9 mmol/L — ABNORMAL HIGH (ref 20.0–28.0)
Calcium, Ion: 1.21 mmol/L (ref 1.15–1.40)
HCT: 36 % — ABNORMAL LOW (ref 39.0–52.0)
Hemoglobin: 12.2 g/dL — ABNORMAL LOW (ref 13.0–17.0)
O2 Saturation: 58 %
Potassium: 3.6 mmol/L (ref 3.5–5.1)
Sodium: 139 mmol/L (ref 135–145)
TCO2: 32 mmol/L (ref 22–32)
pCO2, Ven: 47.7 mmHg (ref 44–60)
pH, Ven: 7.419 (ref 7.25–7.43)
pO2, Ven: 30 mmHg — CL (ref 32–45)

## 2022-09-12 LAB — PROTIME-INR
INR: 1.6 — ABNORMAL HIGH (ref 0.8–1.2)
Prothrombin Time: 19.2 seconds — ABNORMAL HIGH (ref 11.4–15.2)

## 2022-09-12 LAB — TROPONIN I (HIGH SENSITIVITY): Troponin I (High Sensitivity): 804 ng/L (ref <18)

## 2022-09-12 SURGERY — RIGHT HEART CATH
Anesthesia: Moderate Sedation

## 2022-09-12 MED ORDER — SODIUM CHLORIDE 0.9 % IV SOLN: 250.0000 mL | INTRAVENOUS | Status: DC | PRN

## 2022-09-12 MED ORDER — SODIUM CHLORIDE 0.9 % IV SOLN: INTRAVENOUS | Status: DC

## 2022-09-12 MED ORDER — LIDOCAINE HCL 1 % IJ SOLN
INTRAMUSCULAR | Status: AC
  Filled 2022-09-12: qty 20

## 2022-09-12 MED ORDER — ASPIRIN 81 MG PO CHEW: 81.0000 mg | CHEWABLE_TABLET | ORAL | Status: DC

## 2022-09-12 MED ORDER — SODIUM CHLORIDE 0.9% FLUSH
3.0000 mL | Freq: Two times a day (BID) | INTRAVENOUS | Status: DC
  Administered 2022-09-13 – 2022-09-16 (×7): 3 mL via INTRAVENOUS

## 2022-09-12 MED ORDER — SODIUM CHLORIDE 0.9% FLUSH
3.0000 mL | Freq: Two times a day (BID) | INTRAVENOUS | Status: DC
  Administered 2022-09-12 – 2022-09-16 (×6): 3 mL via INTRAVENOUS

## 2022-09-12 MED ORDER — LIDOCAINE HCL (PF) 1 % IJ SOLN
INTRAMUSCULAR | Status: DC | PRN
  Administered 2022-09-12: 2 mL

## 2022-09-12 MED ORDER — SODIUM CHLORIDE 0.9% FLUSH: 3.0000 mL | INTRAVENOUS | Status: DC | PRN

## 2022-09-12 MED ORDER — HEPARIN (PORCINE) IN NACL 1000-0.9 UT/500ML-% IV SOLN
INTRAVENOUS | Status: DC | PRN
  Administered 2022-09-12: 1000 mL

## 2022-09-12 SURGICAL SUPPLY — 9 items
CATH SWAN GANZ 7F STRAIGHT (CATHETERS) IMPLANT
DRAPE BRACHIAL (DRAPES) IMPLANT
GLIDESHEATH SLENDER 7FR .021G (SHEATH) IMPLANT
GUIDEWIRE .025 260CM (WIRE) IMPLANT
GUIDEWIRE EMER 3M J .025X150CM (WIRE) IMPLANT
PACK CARDIAC CATH (CUSTOM PROCEDURE TRAY) ×1 IMPLANT
PROTECTION STATION PRESSURIZED (MISCELLANEOUS) ×1
SET ATX SIMPLICITY (MISCELLANEOUS) IMPLANT
STATION PROTECTION PRESSURIZED (MISCELLANEOUS) IMPLANT

## 2022-09-12 NOTE — Interval H&P Note (Signed)
History and Physical Interval Note:  09/12/2022 4:46 PM  Bradley [name]  has presented today for surgery, with the diagnosis of acute on chronic systolic congestive heart failure exacberation.  The various methods of treatment have been discussed with the patient and family. After consideration of risks, benefits and other options for treatment, the patient has consented to  Procedure(s): RIGHT HEART CATH (N/A) as a surgical intervention.  The patient's history has been reviewed, patient examined, no change in status, stable for surgery.  I have reviewed the patient's chart and labs.  Questions were answered to the patient's satisfaction.     Glenetta Hew

## 2022-09-12 NOTE — Progress Notes (Signed)
 ADVANCED HEART FAILURE CONSULT NOTE  Referring Physician: No ref. provider found  Primary Care: System, Provider Not In   HPI: Bradley [name] is a 81 y.o. male with HFrEF, HTN, HLD, T2DM, CAD s/p CABG, PAD, paroxysmal atrial fibrillation (DCCV 11/15/21), moderate AS that presented to [facility] on 08/22/22 with complaints of SOB and LE edema which had worsened over the past several weeks. In the ER he was found to have a BNP>4500, K 5.7, sCr of 2.84. He was diuresed with IV lasix with some improvement in renal function initially. On 12/30, he underwent debridement of a R foot wound with LE angiography done on 1/02 with stent placement to the right external iliac artery. On 1/8 due to worsening SOB, he was found to be positive for RSV. Through his hospitalization his renal function has fluctuated with a sCr as high as 3.36 on 09/08/21. He was ultimately started on a lasix infusion at 3mg/hr by nephrology with steady urine output and continued improvement in renal function (2.1 today).   Bradley [name] was admitted to [facility] from 12/6-12/15 for similar complaints. As per chart review, he was having 1-2 weeks of dyspnea and LE edema. While admitted there he was diuresed with IV lasix and discharged home on metoprolol 50mg daily, spironolactone 12.5mg daily, entresto 24/26 BID, and jardiance and lasix 40mg PO daily. He underwent a LHC that is reported below; essentially stable from prior. He also underwent evaluation for moderate-severe LFLG aortic stenosis with stress echo w/ mean gradient of 14mmHg. At discharge, his sCr was once more rising to 1.9 (1.6 3 days prior).   From a symptomatic standpoint, Bradley [name] reports feeling volume overloaded and short of breath while being discharged recently from [facility].He had 4 pillow orthopnea and PND. Today he reports near complete resolution of these symptoms.     INTERVAL HX:  - Improvement in sCr - 1.1 L urine output yesterday   PHYSICAL EXAM: Vitals:   09/12/22 0648  09/12/22 0802  BP: 132/62 (!) 122/53  Pulse: 60 (!) 58  Resp:  14  Temp:  (!) 97.5 F (36.4 C)  SpO2: 96% 97%   GENERAL: comfortable, NAD; laying flat in bed.  HEENT: Negative for arcus senilis or xanthelasma. There is no scleral icterus.  The mucous membranes are pink and moist.   NECK: Supple, No masses. Normal carotid upstrokes without bruits. No masses or thyromegaly.    CHEST: There are no chest wall deformities. There is no chest wall tenderness. Respirations are unlabored.  Lungs- decreased at bases with mild crackles bilaterally CARDIAC:  JVP: 9-10cm         Normal S1, S2  Normal rate with regular rhythm. No murmurs, rubs or gallops.  Pulses are 2+ and symmetrical in upper and lower extremities. No edema.  ABDOMEN: Soft, non-tender, non-distended. There are no masses or hepatomegaly. There are normal bowel sounds.  EXTREMITIES: Warm and well perfused with no cyanosis, clubbing.  LYMPHATIC: No axillary or supraclavicular lymphadenopathy.  NEUROLOGIC: Patient is oriented x3 with no focal or lateralizing neurologic deficits.  PSYCH: Patients affect is appropriate, there is no evidence of anxiety or depression.  SKIN: Warm and dry; no lesions or wounds.   DATA REVIEW  ECG:09/06/22: No P waves; junctional rhythm v sinus rhythm with 1AVB  ECHO: 09/07/22: LVEF 35-40%, RV function moderately reduced.  08/28/22: LVEF 40-45%,normal RV function; PASP 50mmHg 03/22/22: LVEF 40-45%, RV function mildly reduced  CATH: 07/10/21 Severe native coronary artery disease with 90% distal LMCA stenosis,   diffuse proximal LAD disease with functional mid vessel occlusion with competitive flow from LIMA-LAD, 99% proximal OM1 stenosis, and CTO of proximal RCA. Widely patent LIMA-LAD and SVG-OM3. Patent SVG-RPDA with 70% stenosis near distal anastomosis.  RPDA distal to SVG anastomosis is occluded (? culprit lesion for NSTEMI). Mildly elevated left ventricular filling pressure (LVEDP 15-20  mmHg). Mild-moderate aortic valve stenosis (peak-to-peak gradient ~20 mmHg).  LHC, [facility], 09/13/21:  99% lesion noted at the distal anastomosis of the SVG to distal RCA/PL. JR4 guide, and multiple wires would not cross ([employer], runthrough, pilot 50, fielder XT). Used corsair to provide support, and was not able to cross distal lesion given severe bend in  SVG to native vessel.    ASSESSMENT & PLAN:  Acute on chronic systolic heart failure exacerbation  - TTE on 09/07/22 with LVEF 35%-40% - Improvement in volume status on exam today; no longer has pitting edema in lower extremities. Will transition to intermittent dosing IV lasix after RHC. - Continue hydralazine 10mg  TID, imdur 30mg , toprol 50mg  daily.  - Continued improvement in sCr, currently on lasix 5mg /hr - Plan for RHC today; will adjust diuretics accordingly.   2. AKI on CKD  - Baseline sCr of 1.38 on 03/14/22; up to 2.8 on admission  - Reviewed [facility] records; sCr at low as 1.5 there, however, 1.9 at discharge (08/09/22) - Net negative 12L since admit. sCr continuing to improve. - Continue lasix gtt at 5mg /hr.   3. CAD s/p CABG  - Angiography at [facility] w/ patent LIMA-LAD, SVG-OM and severe SVG-R PLV disease not amenable to PCI; otherwise, severe native vessel CAD that was stable.   4. Paroxysmal atrial fibrillation  - amiodarone 100mg  daily, metop succinate 50mg  and apixaban  5. Moderate aortic stenosis - Evaluated recently at [facility] for low flow low gradient; dobutamine stress echo negative for LFLG. Continue outpatient monitoring.   4. T2DM - A1C 7.8 - Followed by internal medicine    Bradley [name] Advanced Heart Failure Mechanical Circulatory Support

## 2022-09-12 NOTE — Progress Notes (Signed)
PT Cancellation Note  Patient Details Name: Bradley [name] MRN: [id number] DOB: 05-02-1941   Cancelled Treatment:    Reason Eval/Treat Not Completed: Patient at procedure or test/unavailable (Pt going for cardiac cath today. Will resume services once ready.)  10:32 AM, 09/12/22 [name], PT, DPT Physical Therapist - [facility]  [phone] ([place])    Ignatius Kloos C 09/12/2022, 10:32 AM

## 2022-09-12 NOTE — Progress Notes (Signed)
Mobility Specialist - Progress Note   09/12/22 0848  Mobility  Activity Refused mobility  $Mobility charge 1 Mobility   Pt refuses mobility this AM, wants to wait until after procedure this afternoon. Will attempt at another date and time.   Gretchen Short  Mobility Specialist  09/12/22 8:49 AM

## 2022-09-12 NOTE — Progress Notes (Addendum)
[facility]  ROUNDING NOTE   Subjective:   Patient seen resting in bed No family at bedside  Alert and oriented Denies pain and discomfort Lower extremity edema greatly improved, but still remains  Objective:  Vital signs in last 24 hours:  Temp:  [97.5 F (36.4 C)-98.3 F (36.8 C)] 97.5 F (36.4 C) (01/18 0802) Pulse Rate:  [53-95] 58 (01/18 0802) Resp:  [14-20] 14 (01/18 0802) BP: (113-134)/(48-62) 122/53 (01/18 0802) SpO2:  [96 %-99 %] 97 % (01/18 0802) Weight:  [77.2 kg-81.9 kg] 77.2 kg (01/18 0832)  Weight change: -2.177 kg Filed Weights   09/10/22 1008 09/11/22 1133 09/12/22 0832  Weight: 84.1 kg 81.9 kg 77.2 kg    Intake/Output: I/O last 3 completed shifts: In: 52.8 [I.V.:52.8] Out: 1800 [Urine:1800]   Intake/Output this shift:  Total I/O In: 30 [P.O.:30] Out: -   Physical Exam: General: NAD  Head: Normocephalic, atraumatic.   Eyes: Anicteric  Lungs:  Clear to auscultation normal effort, room air  Heart: Regular rate and rhythm  Abdomen:  Soft, nontender, nondistended  Extremities: 1-2+ peripheral edema.  Neurologic: Alert and oriented, moving all four extremities  Skin: No lesions  Access: None    Basic Metabolic Panel: Recent Labs  Lab 09/06/22 0346 09/07/22 0649 09/08/22 0552 09/09/22 0313 09/10/22 0515 09/11/22 0513 09/12/22 0201  NA 134* 131* 131* 131* 134* 135 139  K 4.8 5.3* 4.8 4.8 4.9 4.3 3.5  CL 99 96* 98 97* 98 100 101  CO2 23 23 23 24 27 25 28   GLUCOSE 178* 223* 167* 248* 146* 144* 153*  BUN 67* 89* 100* 99* 89* 85* 73*  CREATININE 1.80* 2.76* 3.36* 2.87* 2.48* 2.10* 1.97*  CALCIUM 8.7* 8.7* 8.5* 8.1* 8.7* 8.8* 8.4*  MG 2.6* 2.6*  --   --  2.5* 2.5*  --      Liver Function Tests: No results for input(s): "AST", "ALT", "ALKPHOS", "BILITOT", "PROT", "ALBUMIN" in the last 168 hours. No results for input(s): "LIPASE", "AMYLASE" in the last 168 hours. No results for input(s): "AMMONIA" in the last 168  hours.  CBC: Recent Labs  Lab 09/07/22 0649 09/10/22 0515 09/11/22 0513 09/12/22 0201  WBC 8.6 9.8 8.6 8.5  HGB 10.8* 11.0* 11.6* 10.6*  HCT 34.8* 35.6* 36.9* 34.4*  MCV 95.9 94.9 95.1 94.0  PLT 289 277 269 246     Cardiac Enzymes: No results for input(s): "CKTOTAL", "CKMB", "CKMBINDEX", "TROPONINI" in the last 168 hours.  BNP: Invalid input(s): "POCBNP"  CBG: Recent Labs  Lab 09/11/22 0742 09/11/22 1128 09/11/22 1718 09/11/22 2121 09/12/22 0803  GLUCAP 141* 177* 238* 238* 146*     Microbiology: Results for orders placed or performed during the hospital encounter of 08/22/22  Blood culture (routine x 2)     Status: None   Collection Time: 08/22/22  4:20 PM   Specimen: BLOOD  Result Value Ref Range Status   Specimen Description BLOOD BLOOD RIGHT WRIST  Final   Special Requests   Final    BOTTLES DRAWN AEROBIC AND ANAEROBIC Blood Culture adequate volume   Culture   Final    NO GROWTH 5 DAYS Performed at [facility], [address] [place], [place]    Report Status 08/27/2022 FINAL  Final  Blood culture (routine x 2)     Status: None   Collection Time: 08/22/22  4:20 PM   Specimen: BLOOD  Result Value Ref Range Status   Specimen Description BLOOD BLOOD RIGHT FOREARM  Final   Special Requests   Final    BOTTLES DRAWN AEROBIC AND ANAEROBIC Blood Culture adequate volume   Culture   Final    NO GROWTH 5 DAYS Performed at [facility], [place], [place] [address]    Report Status 08/27/2022 FINAL  Final  Resp panel by RT-PCR (RSV, Flu A&B, Covid)     Status: Abnormal   Collection Time: 09/02/22  4:51 PM  Result Value Ref Range Status   SARS Coronavirus 2 by RT PCR NEGATIVE NEGATIVE Final    Comment: (NOTE) SARS-CoV-2 target nucleic acids are NOT DETECTED.  The SARS-CoV-2 RNA is generally detectable in upper respiratory specimens during the acute phase of infection. The lowest concentration of SARS-CoV-2 viral  copies this assay can detect is 138 copies/mL. A negative result does not preclude SARS-Cov-2 infection and should not be used as the sole basis for treatment or other patient management decisions. A negative result may occur with  improper specimen collection/handling, submission of specimen other than nasopharyngeal swab, presence of viral mutation(s) within the areas targeted by this assay, and inadequate number of viral copies(<138 copies/mL). A negative result must be combined with clinical observations, patient history, and epidemiological information. The expected result is Negative.  Fact Sheet for Patients:  EntrepreneurPulse.com.au  Fact Sheet for Healthcare Providers:  IncredibleEmployment.be  This test is no t yet approved or cleared by the [place] FDA and  has been authorized for detection and/or diagnosis of SARS-CoV-2 by FDA under an Emergency Use Authorization (EUA). This EUA will remain  in effect (meaning this test can be used) for the duration of the COVID-19 declaration under Section 564(b)(1) of the Act, 21 U.S.C.section 360bbb-3(b)(1), unless the authorization is terminated  or revoked sooner.       Influenza A by PCR NEGATIVE NEGATIVE Final   Influenza B by PCR NEGATIVE NEGATIVE Final    Comment: (NOTE) The Xpert Xpress SARS-CoV-2/FLU/RSV plus assay is intended as an aid in the diagnosis of influenza from Nasopharyngeal swab specimens and should not be used as a sole basis for treatment. Nasal washings and aspirates are unacceptable for Xpert Xpress SARS-CoV-2/FLU/RSV testing.  Fact Sheet for Patients: EntrepreneurPulse.com.au  Fact Sheet for Healthcare Providers: IncredibleEmployment.be  This test is not yet approved or cleared by the [place] FDA and has been authorized for detection and/or diagnosis of SARS-CoV-2 by FDA under an Emergency Use Authorization (EUA). This  EUA will remain in effect (meaning this test can be used) for the duration of the COVID-19 declaration under Section 564(b)(1) of the Act, 21 U.S.C. section 360bbb-3(b)(1), unless the authorization is terminated or revoked.     Resp Syncytial Virus by PCR POSITIVE (A) NEGATIVE Final    Comment: (NOTE) Fact Sheet for Patients: EntrepreneurPulse.com.au  Fact Sheet for Healthcare Providers: IncredibleEmployment.be  This test is not yet approved or cleared by the [place] FDA and has been authorized for detection and/or diagnosis of SARS-CoV-2 by FDA under an Emergency Use Authorization (EUA). This EUA will remain in effect (meaning this test can be used) for the duration of the COVID-19 declaration under Section 564(b)(1) of the Act, 21 U.S.C. section 360bbb-3(b)(1), unless the authorization is terminated or revoked.  Performed at [facility], [address] [place], [place] [address]     Coagulation Studies: Recent Labs    09/12/22 0201  LABPROT 19.2*  INR 1.6*    Urinalysis: No results for input(s): "COLORURINE", "LABSPEC", "PHURINE", "GLUCOSEU", "HGBUR", "BILIRUBINUR", "KETONESUR", "PROTEINUR", "  UROBILINOGEN", "NITRITE", "LEUKOCYTESUR" in the last 72 hours.  Invalid input(s): "APPERANCEUR"    Imaging: No results found.   Medications:    sodium chloride Stopped (08/24/22 1617)   sodium chloride     sodium chloride     sodium chloride 10 mL/hr at 09/12/22 0414   furosemide (LASIX) 200 mg in dextrose 5 % 100 mL (2 mg/mL) infusion 5 mg/hr (09/12/22 0417)    amiodarone  100 mg Oral Daily   apixaban  2.5 mg Oral BID   aspirin  81 mg Oral Pre-Cath   aspirin EC  81 mg Oral Daily   atorvastatin  80 mg Oral QHS   brinzolamide  1 drop Both Eyes TID   And   brimonidine  1 drop Both Eyes TID   Chlorhexidine Gluconate Cloth  6 each Topical Daily   collagenase   Topical Daily   cyanocobalamin  1,000 mcg Oral Daily    hydrALAZINE  10 mg Oral Q8H   insulin aspart  0-5 Units Subcutaneous QHS   insulin aspart  0-9 Units Subcutaneous TID WC   insulin aspart  3 Units Subcutaneous TID WC   isosorbide mononitrate  30 mg Oral Daily   metoprolol succinate  50 mg Oral Daily   mometasone-formoterol  2 puff Inhalation BID   pantoprazole  40 mg Oral Daily   polyethylene glycol  17 g Oral BID   sodium chloride flush  3 mL Intravenous Q12H   timolol  1 drop Both Eyes QHS   Travoprost (BAK Free)  1 drop Both Eyes QHS   sodium chloride, sodium chloride, sodium chloride, acetaminophen, albuterol, dextromethorphan-guaiFENesin, ipratropium-albuterol, methocarbamol, nitroGLYCERIN, ondansetron (ZOFRAN) IV, oxyCODONE, sodium chloride flush, sodium chloride flush  Assessment/ Plan:  [name] is a 82 y.o.  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  Baseline GFR appears to be between 35 and 37.  Current GFR 31, has been as low as 29.    Renal function has returned to baseline. Adequate urine output noted, 1.1L Will continue to monitor renal function, cardiac catheterization scheduled for later today.   Lab Results  Component Value Date   CREATININE 1.97 (H) 09/12/2022   CREATININE 2.10 (H) 09/11/2022   CREATININE 2.48 (H) 09/10/2022    Intake/Output Summary (Last 24 hours) at 09/12/2022 1055 Last data filed at 09/12/2022 0910 Gross per 24 hour  Intake 82.76 ml  Output 600 ml  Net -517.24 ml    2. .  Acute on chronic systolic heart failure.  Echo  completed on 08/28/2022 shows EF 40 to 45% with grade 2 diastolic dysfunction.  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  Also notes aortic calcification with stenosis.  Cardiology following.  Repeat echo 09/07/2022 shows EF 35 to 40%.  Furosemide drip remains in place.  Increased to 5 mg/h by cardiology.      LOS: [employer] 1/18/202410:55 AM

## 2022-09-12 NOTE — H&P (View-Only) (Signed)
ADVANCED HEART FAILURE CONSULT NOTE  Referring Physician: No ref. provider found  Primary Care: System, Provider Not In   HPI: Bradley [name] is a 82 y.o. male with HFrEF, HTN, HLD, T2DM, CAD s/p CABG, PAD, paroxysmal atrial fibrillation (DCCV 11/15/21), moderate AS that presented to [facility] on 08/22/22 with complaints of SOB and LE edema which had worsened over the past several weeks. In the ER he was found to have a BNP>4500, K 5.7, sCr of 2.84. He was diuresed with IV lasix with some improvement in renal function initially. On 12/30, he underwent debridement of a R foot wound with LE angiography done on 1/02 with stent placement to the right external iliac artery. On 1/8 due to worsening SOB, he was found to be positive for RSV. Through his hospitalization his renal function has fluctuated with a sCr as high as 3.36 on 09/08/21. He was ultimately started on a lasix infusion at 3mg /hr by nephrology with steady urine output and continued improvement in renal function (2.1 today).   Bradley [name] was admitted to [facility] from 12/6-12/15 for similar complaints. As per chart review, he was having 1-2 weeks of dyspnea and LE edema. While admitted there he was diuresed with IV lasix and discharged home on metoprolol 50mg  daily, spironolactone 12.5mg  daily, entresto 24/26 BID, and jardiance and lasix 40mg  PO daily. He underwent a LHC that is reported below; essentially stable from prior. He also underwent evaluation for moderate-severe LFLG aortic stenosis with stress echo w/ mean gradient of 54mmHg. At discharge, his sCr was once more rising to 1.9 (1.6 3 days prior).   From a symptomatic standpoint, Bradley [name] reports feeling volume overloaded and short of breath while being discharged recently from [facility]He had 4 pillow orthopnea and PND. Today he reports near complete resolution of these symptoms.     INTERVAL HX:  - Improvement in sCr - 1.1 L urine output yesterday   PHYSICAL EXAM: Vitals:   09/12/22 0648  09/12/22 0802  BP: 132/62 (!) 122/53  Pulse: 60 (!) 58  Resp:  14  Temp:  (!) 97.5 F (36.4 C)  SpO2: 96% 97%   GENERAL: comfortable, NAD; laying flat in bed.  HEENT: Negative for arcus senilis or xanthelasma. There is no scleral icterus.  The mucous membranes are pink and moist.   NECK: Supple, No masses. Normal carotid upstrokes without bruits. No masses or thyromegaly.    CHEST: There are no chest wall deformities. There is no chest wall tenderness. Respirations are unlabored.  Lungs- decreased at bases with mild crackles bilaterally CARDIAC:  JVP: 9-10cm         Normal S1, S2  Normal rate with regular rhythm. No murmurs, rubs or gallops.  Pulses are 2+ and symmetrical in upper and lower extremities. No edema.  ABDOMEN: Soft, non-tender, non-distended. There are no masses or hepatomegaly. There are normal bowel sounds.  EXTREMITIES: Warm and well perfused with no cyanosis, clubbing.  LYMPHATIC: No axillary or supraclavicular lymphadenopathy.  NEUROLOGIC: Patient is oriented x3 with no focal or lateralizing neurologic deficits.  PSYCH: Patients affect is appropriate, there is no evidence of anxiety or depression.  SKIN: Warm and dry; no lesions or wounds.   DATA REVIEW  ECG:09/06/22: No P waves; junctional rhythm v sinus rhythm with 1AVB  ECHO: 09/07/22: LVEF 35-40%, RV function moderately reduced.  08/28/22: LVEF 40-45%,normal RV function; PASP 47mmHg 03/22/22: LVEF 40-45%, RV function mildly reduced  CATH: 07/10/21 Severe native coronary artery disease with 90% distal LMCA stenosis,  diffuse proximal LAD disease with functional mid vessel occlusion with competitive flow from LIMA-LAD, 99% proximal OM1 stenosis, and CTO of proximal RCA. Widely patent LIMA-LAD and SVG-OM3. Patent SVG-RPDA with 70% stenosis near distal anastomosis.  RPDA distal to SVG anastomosis is occluded (? culprit lesion for NSTEMI). Mildly elevated left ventricular filling pressure (LVEDP 15-20  mmHg). Mild-moderate aortic valve stenosis (peak-to-peak gradient ~20 mmHg).  LHC, [facility], 09/13/21:  99% lesion noted at the distal anastomosis of the SVG to distal RCA/PL. JR4 guide, and multiple wires would not cross ([employer], runthrough, pilot 50, fielder XT). Used corsair to provide support, and was not able to cross distal lesion given severe bend in  SVG to native vessel.    ASSESSMENT & PLAN:  Acute on chronic systolic heart failure exacerbation  - TTE on 09/07/22 with LVEF 35%-40% - Improvement in volume status on exam today; no longer has pitting edema in lower extremities. Will transition to intermittent dosing IV lasix after RHC. - Continue hydralazine 10mg  TID, imdur 30mg , toprol 50mg  daily.  - Continued improvement in sCr, currently on lasix 5mg /hr - Plan for RHC today; will adjust diuretics accordingly.   2. AKI on CKD  - Baseline sCr of 1.38 on 03/14/22; up to 2.8 on admission  - Reviewed [facility] records; sCr at low as 1.5 there, however, 1.9 at discharge (08/09/22) - Net negative 12L since admit. sCr continuing to improve. - Continue lasix gtt at 5mg /hr.   3. CAD s/p CABG  - Angiography at [facility] w/ patent LIMA-LAD, SVG-OM and severe SVG-R PLV disease not amenable to PCI; otherwise, severe native vessel CAD that was stable.   4. Paroxysmal atrial fibrillation  - amiodarone 100mg  daily, metop succinate 50mg  and apixaban  5. Moderate aortic stenosis - Evaluated recently at [facility] for low flow low gradient; dobutamine stress echo negative for LFLG. Continue outpatient monitoring.   4. T2DM - A1C 7.8 - Followed by internal medicine    Bradley [name] Advanced Heart Failure Mechanical Circulatory Support

## 2022-09-12 NOTE — Progress Notes (Signed)
Progress Note   Patient: Bradley [name] DZH:[id number] DOB: [birth year] DOA: 08/22/2022     21 DOS: the patient was seen and examined on 09/12/2022   Brief hospital course: 82 y.o. male with past medical history significant of systolic CHF with EF of 41-96%, hypertension, diabetes mellitus, CAD with CABG and PVD, who presented to ED on 08/22/22 with shortness of breath and worsening leg edema.  Patient admitted for acute systolic heart failure.    Following admission, patient underwent debridement of his right foot by podiatry.  Patient underwent lower extremity angiogram with stent placement by vascular surgery patient's renal function worsened and nephrology consulted.  On 1/3, patient had chest pain with troponins peaking to 2500s.  Cardiology following, felt to be non-STEMI.  Felt to be secondary to decompensated heart failure despite doses of IV Lasix and started on Lasix drip.  Hospital course also complicated by development of RSV on 1/8.  Since hospitalization, patient has diuresed almost 7 L and is more than -5 L deficient and is down over 24 pounds from admission.  Patient going for right-sided heart catheterization 1/18.   Assessment and Plan: RSV infection and bronchitis-resolved. Supportive treatment.  Placed on antibiotics due to elevated procalcitonin level.  On steroids and bronchodilators.  Now back to baseline.   Acute on chronic combined systolic and diastolic CHF (congestive heart failure) (HCC) Moderate pulmonary hypertension Elevated troponin secondary to chf.  2D echo on 03/12/2022 showed EF of 40-45% with grade 2 diastolic dysfunction.  BNP> 4500. Patient initially was giving IV Lasix, renal function much worse afterwards.  After recurrent Lasix and worsening renal function, patient was finally placed on Lasix drip on 1/14.  Currently feels better.  Renal function also improving on Lasix.  Some lightheadedness so we will check orthostatics.    AKI superimposed on stage 3a  chronic kidney disease ([place]):  Hyperkalemia. Baseline creatinine 1.38 on 03/14/2022.   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 --Podiatry --> saw patient 12/30 and performed excisional debridement of the ulcerative areas on the right forefoot.  Plan: --cont dressing change per order   PVD (peripheral vascular disease) ([place]) S/p LE angiography w/ stent to R external iliac artery on 08/28/22 --pt has improved perfusion after the stent placement. Continue statin and aspirin.   NSTEMI CAD (coronary artery disease status post CABG and prior PCI --underwent coronary angiography at [place] less than a month ago, showing stable findings of severe native CAD, patent LIMA-LAD and SVG-OM, and patent SVG-R PLA with severe disease at the distal anastomosis that could could not be intervened upon previously at [facility].  --chest pain morning of 1/3 with increasing trop from 72 to 2572, trending down since. Completed course of heparin drip, continue aspirin and statin.  Followed by cardiology. Repeat troponin on 1/17 and 1/18 checked and has started to trend back upward (750) up from essentially normal 5 days prior.  Discussed with cardiology and they will continue to monitor.  Because of patient's renal function, holding off on left-sided heart catheterization and only doing right-sided heart cath.  Repeat troponins in morning.   Moderate aortic stenosis Recent echo done showed moderate aortic stenosis at [facility] TAVR is not indicated at this time and cardiology is following   Type II diabetes mellitus with renal manifestations [facility]):  Recent A1c 7.8.   Poorly controlled.  Continue current insulin regimen, resume home regimen at discharge.   Paroxysmal atrial fibrillation (HCC) s/p Cardioversion Amiodarone 100 mg po daily and Metoprolol  Succinate 50 mg po Daily --cont Eliquis   Normocytic Anemia --No overt bleeding noted monitor CBC         Subjective:  Patient feeling better today  Physical Exam: Vitals:   09/12/22 0832 09/12/22 1247 09/12/22 1532 09/12/22 1651  BP:  (!) 107/46 (!) 117/50   Pulse:  (!) 51 (!) 52   Resp:  18 18   Temp:  98.2 F (36.8 C) 98.3 F (36.8 C)   TempSrc:  Oral Oral   SpO2:  92% 96% 98%  Weight: 77.2 kg     Height:       General exam: Alert and oriented x 3 Respiratory system: Clear to auscultation bilaterally Cardiovascular system: Regular rate and rhythm, S1-S2, occasional ectopic beat Gastrointestinal system: Soft, nontender, distended, positive bowel sounds Central nervous system: No focal deficits Extremities: No clubbing or cyanosis or edema Skin: No rashes, lesions or ulcers Psychiatry: Appropriate, no evidence of psychoses  Data Reviewed: Troponin increased 750-800 Creatinine down to 1.97.  Family Communication: none  Disposition: Status is: Inpatient Remains inpatient appropriate because:  -Follow-up right heart cath -Follow-up elevated cardiac markers -Improvement in renal function -Continue Lasix drip until fully diuresed  Planned Discharge Destination: Home with Home Health     Author: Annita Brod, MD 09/12/2022 4:51 PM  For on call review [email or web address].

## 2022-09-13 ENCOUNTER — Encounter: Payer: Self-pay | Admitting: Cardiology

## 2022-09-13 DIAGNOSIS — B338 Other specified viral diseases: Secondary | ICD-10-CM | POA: Diagnosis not present

## 2022-09-13 DIAGNOSIS — I214 Non-ST elevation (NSTEMI) myocardial infarction: Secondary | ICD-10-CM | POA: Diagnosis not present

## 2022-09-13 DIAGNOSIS — I10 Essential (primary) hypertension: Secondary | ICD-10-CM | POA: Diagnosis not present

## 2022-09-13 DIAGNOSIS — I480 Paroxysmal atrial fibrillation: Secondary | ICD-10-CM | POA: Diagnosis not present

## 2022-09-13 DIAGNOSIS — I5043 Acute on chronic combined systolic (congestive) and diastolic (congestive) heart failure: Secondary | ICD-10-CM | POA: Diagnosis not present

## 2022-09-13 LAB — BASIC METABOLIC PANEL
Anion gap: 9 (ref 5–15)
BUN: 56 mg/dL — ABNORMAL HIGH (ref 8–23)
CO2: 29 mmol/L (ref 22–32)
Calcium: 8.5 mg/dL — ABNORMAL LOW (ref 8.9–10.3)
Chloride: 101 mmol/L (ref 98–111)
Creatinine, Ser: 1.82 mg/dL — ABNORMAL HIGH (ref 0.61–1.24)
GFR, Estimated: 37 mL/min — ABNORMAL LOW (ref >60)
Glucose, Bld: 148 mg/dL — ABNORMAL HIGH (ref 70–99)
Potassium: 3.3 mmol/L — ABNORMAL LOW (ref 3.5–5.1)
Sodium: 139 mmol/L (ref 135–145)

## 2022-09-13 LAB — POCT I-STAT EG7
Acid-Base Excess: 5 mmol/L — ABNORMAL HIGH (ref 0.0–2.0)
Bicarbonate: 30.7 mmol/L — ABNORMAL HIGH (ref 20.0–28.0)
Calcium, Ion: 1.22 mmol/L (ref 1.15–1.40)
HCT: 35 % — ABNORMAL LOW (ref 39.0–52.0)
Hemoglobin: 11.9 g/dL — ABNORMAL LOW (ref 13.0–17.0)
O2 Saturation: 57 %
Potassium: 3.6 mmol/L (ref 3.5–5.1)
Sodium: 139 mmol/L (ref 135–145)
TCO2: 32 mmol/L (ref 22–32)
pCO2, Ven: 47.4 mmHg (ref 44–60)
pH, Ven: 7.42 (ref 7.25–7.43)
pO2, Ven: 30 mmHg — CL (ref 32–45)

## 2022-09-13 LAB — CBC
HCT: 37.5 % — ABNORMAL LOW (ref 39.0–52.0)
Hemoglobin: 11.6 g/dL — ABNORMAL LOW (ref 13.0–17.0)
MCH: 29.1 pg (ref 26.0–34.0)
MCHC: 30.9 g/dL (ref 30.0–36.0)
MCV: 94.2 fL (ref 80.0–100.0)
Platelets: 233 10*3/uL (ref 150–400)
RBC: 3.98 MIL/uL — ABNORMAL LOW (ref 4.22–5.81)
RDW: 21.6 % — ABNORMAL HIGH (ref 11.5–15.5)
WBC: 11 10*3/uL — ABNORMAL HIGH (ref 4.0–10.5)
nRBC: 0 % (ref 0.0–0.2)

## 2022-09-13 LAB — TROPONIN I (HIGH SENSITIVITY): Troponin I (High Sensitivity): 392 ng/L (ref <18)

## 2022-09-13 LAB — GLUCOSE, CAPILLARY
Glucose-Capillary: 139 mg/dL — ABNORMAL HIGH (ref 70–99)
Glucose-Capillary: 109 mg/dL — ABNORMAL HIGH (ref 70–99)
Glucose-Capillary: 136 mg/dL — ABNORMAL HIGH (ref 70–99)
Glucose-Capillary: 219 mg/dL — ABNORMAL HIGH (ref 70–99)

## 2022-09-13 LAB — BRAIN NATRIURETIC PEPTIDE: B Natriuretic Peptide: >4500 pg/mL — ABNORMAL HIGH (ref 0.0–100.0)

## 2022-09-13 MED ORDER — TORSEMIDE 20 MG PO TABS
40.0000 mg | ORAL_TABLET | Freq: Every day | ORAL | Status: DC
  Administered 2022-09-14 – 2022-09-16 (×3): 40 mg via ORAL
  Filled 2022-09-13 (×3): qty 2

## 2022-09-13 MED ORDER — EMPAGLIFLOZIN 10 MG PO TABS
10.0000 mg | ORAL_TABLET | Freq: Every day | ORAL | Status: DC
  Administered 2022-09-13 – 2022-09-16 (×4): 10 mg via ORAL
  Filled 2022-09-13 (×4): qty 1

## 2022-09-13 MED ORDER — POTASSIUM CHLORIDE CRYS ER 20 MEQ PO TBCR
20.0000 meq | EXTENDED_RELEASE_TABLET | Freq: Once | ORAL | Status: AC
  Administered 2022-09-13: 20 meq via ORAL
  Filled 2022-09-13: qty 1

## 2022-09-13 NOTE — Progress Notes (Signed)
ADVANCED HEART FAILURE CONSULT NOTE  Referring Physician: No ref. provider found  Primary Care: System, Provider Not In   HPI: Bradley [name] is a 82 y.o. male with HFrEF, HTN, HLD, T2DM, CAD s/p CABG, PAD, paroxysmal atrial fibrillation (DCCV 11/15/21), moderate AS that presented to [facility] on 08/22/22 with complaints of SOB and LE edema which had worsened over the past several weeks. In the ER he was found to have a BNP>4500, K 5.7, sCr of 2.84. He was diuresed with IV lasix with some improvement in renal function initially. On 12/30, he underwent debridement of a R foot wound with LE angiography done on 1/02 with stent placement to the right external iliac artery. On 1/8 due to worsening SOB, he was found to be positive for RSV. Through his hospitalization his renal function has fluctuated with a sCr as high as 3.36 on 09/08/21. He was ultimately started on a lasix infusion at 3mg /hr by nephrology with steady urine output and continued improvement in renal function (2.1 today).   Bradley [name] was admitted to [place] from 12/6-12/15 for similar complaints. As per chart review, he was having 1-2 weeks of dyspnea and LE edema. While admitted there he was diuresed with IV lasix and discharged home on metoprolol 50mg  daily, spironolactone 12.5mg  daily, entresto 24/26 BID, and jardiance and lasix 40mg  PO daily. He underwent a LHC that is reported below; essentially stable from prior. He also underwent evaluation for moderate-severe LFLG aortic stenosis with stress echo w/ mean gradient of 78mmHg. At discharge, his sCr was once more rising to 1.9 (1.6 3 days prior).   From a symptomatic standpoint, Bradley [name] reports feeling volume overloaded and short of breath while being discharged recently from [facility].He had 4 pillow orthopnea and PND. Today he reports near complete resolution of these symptoms.     INTERVAL HX:  - sCr continues to improve - RHC yesterday with RA pressure ~5.  PHYSICAL EXAM: Vitals:    09/13/22 0409 09/13/22 0731  BP: (!) 111/47 (!) 115/51  Pulse: (!) 55 61  Resp: 18 18  Temp: 98.2 F (36.8 C) 98.6 F (37 C)  SpO2: 95% 98%   GENERAL: comfortable, NAD; laying flat in bed.  HEENT: Negative for arcus senilis or xanthelasma. There is no scleral icterus.  The mucous membranes are pink and moist.   NECK: Supple, No masses. Normal carotid upstrokes without bruits. No masses or thyromegaly.    CHEST: There are no chest wall deformities. There is no chest wall tenderness. Respirations are unlabored.  Lungs- decreased at bases with mild crackles bilaterally CARDIAC:  JVP: 5-7         Normal S1, S2  Normal rate with regular rhythm. No murmurs, rubs or gallops.  Pulses are 2+ and symmetrical in upper and lower extremities. No edema.  ABDOMEN: Soft, non-tender, non-distended. There are no masses or hepatomegaly. There are normal bowel sounds.  EXTREMITIES: Warm and well perfused; no edema.  LYMPHATIC: No axillary or supraclavicular lymphadenopathy.  NEUROLOGIC: Patient is oriented x3 with no focal or lateralizing neurologic deficits.  PSYCH: Patients affect is appropriate, there is no evidence of anxiety or depression.  SKIN: Warm and dry; no lesions or wounds.   DATA REVIEW  ECG:09/06/22: No P waves; junctional rhythm v sinus rhythm with 1AVB  ECHO: 09/07/22: LVEF 35-40%, RV function moderately reduced.  08/28/22: LVEF 40-45%,normal RV function; PASP 71mmHg 03/22/22: LVEF 40-45%, RV function mildly reduced  CATH: 07/10/21 Severe native coronary artery disease with 90% distal  LMCA stenosis, diffuse proximal LAD disease with functional mid vessel occlusion with competitive flow from LIMA-LAD, 99% proximal OM1 stenosis, and CTO of proximal RCA. Widely patent LIMA-LAD and SVG-OM3. Patent SVG-RPDA with 70% stenosis near distal anastomosis.  RPDA distal to SVG anastomosis is occluded (? culprit lesion for NSTEMI). Mildly elevated left ventricular filling pressure (LVEDP 15-20  mmHg). Mild-moderate aortic valve stenosis (peak-to-peak gradient ~20 mmHg).  LHC, [facility], 09/13/21:  99% lesion noted at the distal anastomosis of the SVG to distal RCA/PL. JR4 guide, and multiple wires would not cross ([employer], runthrough, pilot 50, fielder XT). Used corsair to provide support, and was not able to cross distal lesion given severe bend in  SVG to native vessel.   RHC: 09/12/22: ~ Mild-Moderate Pulm HTN: PAP 59/12 - mean 32 mmHg; PCWP 19/26- 16 mmHg AoSat 96%l PA Sat 58%:  CO-CI (Fick) 4.23-2.43; thermodilution is 3.11-1.57   ASSESSMENT & PLAN:  Acute on chronic systolic heart failure exacerbation  - TTE on 09/07/22 with LVEF 35%-40% - Continue hydralazine 10mg  TID, imdur 30mg , continue toprol 50.  - RHC yesterday with moderate to severely reduced cardiac index, mildly elevated PA mean and normal filling pressures; at this time will avoid home inotropes due to multiple co-morbidities and frailty.  - start jardiance 10mg  daily, expect slight rise in sCr tomorrow.  - D/C lasix gtt. Torsemide 40mg  tomorrow. - On exam does not appear to be in low output heart failure; thermodilution and Fick outputs discordant yesterday, will continue toprol 50mg  for the time being. Possible addition of digoxin outpatient if renal function continues to improve. - will consider addition of ARB outpatient. Plan for follow up in 1 week in HF clinic at [facility].   2. AKI on CKD  - Baseline sCr of 1.38 on 03/14/22; up to 2.8 on admission  - Reviewed [facility] records; sCr at low as 1.5 there, however, 1.9 at discharge (08/09/22) - Net negative 12L since admit. sCr continuing to improve. - start jardiance 10mg , expect slight rise in sCr tomorrow.  - torsemide 40mg  daily starting tomorrow.   3. CAD s/p CABG  - Angiography at [facility] w/ patent LIMA-LAD, SVG-OM and severe SVG-R PLV disease not amenable to PCI; otherwise, severe native vessel CAD that was stable.   4. Paroxysmal atrial fibrillation  - amiodarone 100mg   daily, metop succinate 50mg  and apixaban  5. Moderate aortic stenosis - Evaluated recently at [facility] for low flow low gradient; dobutamine stress echo negative for LFLG. Continue outpatient monitoring.   4. T2DM - A1C 7.8 - Followed by internal medicine    Viney Acocella Advanced Heart Failure Mechanical Circulatory Support

## 2022-09-13 NOTE — Progress Notes (Signed)
PT Cancellation Note  Patient Details Name: Bradley [name] MRN: [id number] DOB: 1941-02-18   Cancelled Treatment:    Reason Eval/Treat Not Completed: Other (comment) (Pt eating lunch on entry, reports AMB to [facility] earlier today, feeling better. Does not feel up to PT today, will be ready tomorrow.) PT will signoff at this time. Pt has been offered regular opportunities to work with our services over the past 2 weeks. We have discussed making pain control a priority and advancing self back to daily routine of mobility in room and OOB. Pt has consistently gotten to EOB for meals, but continues to feel limited in energy or pain control, both of which detract from motivation to partake in PT. We have seen him ~1x weekly as a result. He has been more successful meeting his goals of mobility with mobility team and [facility] staff on unit. Will sign off at this time and defer mobility needs to floor staff. Still feel home health PT to be appropriate.   1:36 PM, 09/13/22 [name], PT, DPT Physical Therapist - [facility]  [phone] (AS[facility])    1:32 PM, 09/13/22 [name], PT, DPT Physical Therapist - [facility]  [phone] ([place])    Bradley [name] 09/13/2022, 1:32 PM

## 2022-09-13 NOTE — Progress Notes (Signed)
Progress Note   Patient: Bradley [name] DJM:[id number] DOB: 1940-10-26 DOA: 08/22/2022     22 DOS: the patient was seen and examined on 09/13/2022   Brief hospital course: 82 y.o. male with past medical history significant of systolic CHF with EF of 22-29%, moderate aortic stenosis hypertension, diabetes mellitus, CAD with CABG and PVD, who presented to ED on 08/22/22 with shortness of breath and worsening leg edema.  Patient admitted for acute systolic heart failure.    Following admission, patient underwent debridement of his right foot by podiatry.  Patient underwent lower extremity angiogram with stent placement by vascular surgery patient's renal function worsened and nephrology consulted.  On 1/3, patient had chest pain with troponins peaking to 2500s.  Cardiology following, felt to be non-STEMI.  Felt to be secondary to decompensated heart failure despite doses of IV Lasix and started on Lasix drip.  Hospital course also complicated by development of RSV on 1/8.  Since hospitalization, patient has diuresed almost 7 L and is more than -5 L deficient and is down over 20 pounds from admission.  Patient went for right-sided heart catheterization 1/18 noting moderate to severe reduced cardiac index with mild elevated PA pressure.   Assessment and Plan: RSV infection and bronchitis-resolved. Supportive treatment.  Placed on antibiotics due to elevated procalcitonin level.  On steroids and bronchodilators.  Now back to baseline.   Acute on chronic combined systolic and diastolic CHF (congestive heart failure) (HCC) Moderate pulmonary hypertension Elevated troponin secondary to chf.  2D echo on 03/12/2022 showed EF of 40-45% with grade 2 diastolic dysfunction.  BNP> 4500. Patient initially was giving IV Lasix, renal function much worse afterwards.  After recurrent Lasix and worsening renal function, patient was finally placed on Lasix drip on 1/14.  Currently feels better.  Renal function also  improving on Lasix.  Some lightheadedness so renal function improving.  Lasix drip discontinued and starting on Jardiance plus daily torsemide.  Cardiology continuing to follow.    AKI superimposed on stage 3a chronic kidney disease ([place]):  Hyperkalemia. Baseline creatinine 1.38 on 03/14/2022.   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 --Podiatry --> saw patient 12/30 and performed excisional debridement of the ulcerative areas on the right forefoot.  Plan: --cont dressing change per order   PVD (peripheral vascular disease) ([place]) S/p LE angiography w/ stent to R external iliac artery on 08/28/22 --pt has improved perfusion after the stent placement. Continue statin and aspirin.   NSTEMI CAD (coronary artery disease status post CABG and prior PCI --underwent coronary angiography at [facility] less than a month ago, showing stable findings of severe native CAD, patent LIMA-LAD and SVG-OM, and patent SVG-R PLA with severe disease at the distal anastomosis that could could not be intervened upon previously at [facility].  --chest pain morning of 1/3 with increasing trop from 72 to 2572, trending down since. Completed course of heparin drip, continue aspirin and statin.  Followed by cardiology. Repeat troponin on 1/17 and 1/18 checked and has started to trend back upward (750) up from essentially normal 5 days prior.  Discussed with cardiology and they will continue to monitor.  Troponins continue to decrease.  Right heart catheterization notes moderate to severely reduced cardiac index.  Patient to be started on Jardiance.   Moderate aortic stenosis Recent echo done showed moderate aortic stenosis  at [facility] TAVR is not indicated at this time and cardiology is following   Type II diabetes mellitus with renal  manifestations [facility]):  Recent A1c 7.8.   Poorly controlled.   Continue current insulin regimen, resume home regimen at discharge.   Paroxysmal atrial fibrillation (HCC) s/p Cardioversion Amiodarone 100 mg po daily and Metoprolol Succinate 50 mg po Daily --cont Eliquis   Normocytic Anemia --No overt bleeding noted monitor CBC         Subjective:  Continues to feel better.  Physical Exam: Vitals:   09/12/22 2329 09/13/22 0409 09/13/22 0731 09/13/22 1233  BP: (!) 111/52 (!) 111/47 (!) 115/51 (!) 105/56  Pulse: (!) 58 (!) 55 61 (!) 56  Resp: 18 18 18 18   Temp: 98.3 F (36.8 C) 98.2 F (36.8 C) 98.6 F (37 C) 98.8 F (37.1 C)  TempSrc: Oral Oral Oral Oral  SpO2: 97% 95% 98% 99%  Weight:   76.8 kg   Height:       General exam: Alert and oriented x 3 Respiratory system: Clear to auscultation bilaterally Cardiovascular system: Regular rate and rhythm, S1-S2, occasional ectopic beat Gastrointestinal system: Soft, nontender, distended, positive bowel sounds Central nervous system: No focal deficits Extremities: No clubbing or cyanosis or edema Skin: No rashes, lesions or ulcers Psychiatry: Appropriate, no evidence of psychoses  Data Reviewed: BNP greater than 4500.  Troponin down to 390. Creatinine down to 1.8  Family Communication: none  Disposition: Status is: Inpatient Remains inpatient appropriate because:  -Improvement in renal function -Continue diuresis, clearance by cardiology  Planned Discharge Destination: Home with Home Health     Author: Annita Brod, MD 09/13/2022 1:52 PM  For on call review [email or web address].

## 2022-09-13 NOTE — TOC Progression Note (Signed)
Transition of Care [facility]) - Progression Note    Patient Details  Name: [name] MRN: [id number] Date of Birth: March 30, 1941  Transition of Care [facility] CM/SW [place], [place] Phone Number: 09/13/2022, 10:29 AM  Clinical Narrative:     TOC continues to follow for discharge planning needs.    Patient has been set up with [facility], per [name] with [facility] they have [place] authorization for Home Health at dc.   Patient will go home to nieces house at discharge [place], [place] [id number]    No further dc needs identified at this time.   Expected Discharge Plan: [place] Barriers to Discharge: Continued Medical Work up  Expected Discharge Plan and Services   Discharge Planning Services: CM Consult   Living arrangements for the past 2 months: Single Family Home                                       Social Determinants of Health (SDOH) Interventions [place]: No Food Insecurity (08/23/2022)  Housing: Low Risk  (08/23/2022)  Transportation Needs: No Transportation Needs (08/23/2022)  Utilities: Not At Risk (08/23/2022)  Tobacco Use: Medium Risk (09/13/2022)    Readmission Risk Interventions     No data to display

## 2022-09-13 NOTE — Progress Notes (Signed)
[facility]  ROUNDING NOTE   Subjective:   Patient sitting up in bed Alert and oriented Denies pain and discomfort Remains on room air  Lower extremity edema continues to improve.   Objective:  Vital signs in last 24 hours:  Temp:  [97.7 F (36.5 C)-98.6 F (37 C)] 98.6 F (37 C) (01/19 0731) Pulse Rate:  [0-89] 61 (01/19 0731) Resp:  [11-18] 18 (01/19 0731) BP: (107-131)/(46-62) 115/51 (01/19 0731) SpO2:  [87 %-98 %] 98 % (01/19 0731) Weight:  [76.8 kg] 76.8 kg (01/19 0731)  Weight change: -4.674 kg Filed Weights   09/11/22 1133 09/12/22 0832 09/13/22 0731  Weight: 81.9 kg 77.2 kg 76.8 kg    Intake/Output: I/O last 3 completed shifts: In: 273 [P.O.:270; I.V.:3] Out: 3150 [Urine:3150]   Intake/Output this shift:  Total I/O In: 240 [P.O.:240] Out: 550 [Urine:550]  Physical Exam: General: NAD  Head: Normocephalic, atraumatic.   Eyes: Anicteric  Lungs:  Clear to auscultation normal effort, room air  Heart: Regular rate and rhythm  Abdomen:  Soft, nontender, nondistended  Extremities: 1+ peripheral edema.  Neurologic: Alert and oriented, moving all four extremities  Skin: No lesions  Access: None    Basic Metabolic Panel: Recent Labs  Lab 09/07/22 0649 09/08/22 0552 09/09/22 0313 09/10/22 0515 09/11/22 0513 09/12/22 0201 09/12/22 1719 09/13/22 0540  NA 131*   < > 131* 134* 135 139 139 139  K 5.3*   < > 4.8 4.9 4.3 3.5 3.6 3.3*  CL 96*   < > 97* 98 100 101  --  101  CO2 23   < > 24 27 25 28   --  29  GLUCOSE 223*   < > 248* 146* 144* 153*  --  148*  BUN 89*   < > 99* 89* 85* 73*  --  56*  CREATININE 2.76*   < > 2.87* 2.48* 2.10* 1.97*  --  1.82*  CALCIUM 8.7*   < > 8.1* 8.7* 8.8* 8.4*  --  8.5*  MG 2.6*  --   --  2.5* 2.5*  --   --   --    < > = values in this interval not displayed.     Liver Function Tests: No results for input(s): "AST", "ALT", "ALKPHOS", "BILITOT", "PROT", "ALBUMIN" in the last 168 hours. No results for  input(s): "LIPASE", "AMYLASE" in the last 168 hours. No results for input(s): "AMMONIA" in the last 168 hours.  CBC: Recent Labs  Lab 09/07/22 0649 09/10/22 0515 09/11/22 0513 09/12/22 0201 09/12/22 1719 09/13/22 0540  WBC 8.6 9.8 8.6 8.5  --  11.0*  HGB 10.8* 11.0* 11.6* 10.6* 12.2* 11.6*  HCT 34.8* 35.6* 36.9* 34.4* 36.0* 37.5*  MCV 95.9 94.9 95.1 94.0  --  94.2  PLT 289 277 269 246  --  233     Cardiac Enzymes: No results for input(s): "CKTOTAL", "CKMB", "CKMBINDEX", "TROPONINI" in the last 168 hours.  BNP: Invalid input(s): "POCBNP"  CBG: Recent Labs  Lab 09/12/22 1307 09/12/22 1528 09/12/22 1815 09/12/22 2133 09/13/22 0728  GLUCAP 185* 161* 125* 256* 139*     Microbiology: Results for orders placed or performed during the hospital encounter of 08/22/22  Blood culture (routine x 2)     Status: None   Collection Time: 08/22/22  4:20 PM   Specimen: BLOOD  Result Value Ref Range Status   Specimen Description BLOOD BLOOD RIGHT WRIST  Final   Special Requests   Final    BOTTLES  DRAWN AEROBIC AND ANAEROBIC Blood Culture adequate volume   Culture   Final    NO GROWTH 5 DAYS Performed at [facility], [address] [place] [place], [place] [address]    Report Status 08/27/2022 FINAL  Final  Blood culture (routine x 2)     Status: None   Collection Time: 08/22/22  4:20 PM   Specimen: BLOOD  Result Value Ref Range Status   Specimen Description BLOOD BLOOD RIGHT FOREARM  Final   Special Requests   Final    BOTTLES DRAWN AEROBIC AND ANAEROBIC Blood Culture adequate volume   Culture   Final    NO GROWTH 5 DAYS Performed at [facility], [address], [place], [place] [address]    Report Status 08/27/2022 FINAL  Final  Resp panel by RT-PCR (RSV, Flu A&B, Covid)     Status: Abnormal   Collection Time: 09/02/22  4:51 PM  Result Value Ref Range Status   SARS Coronavirus 2 by RT PCR NEGATIVE NEGATIVE Final    Comment: (NOTE) SARS-CoV-2 target  nucleic acids are NOT DETECTED.  The SARS-CoV-2 RNA is generally detectable in upper respiratory specimens during the acute phase of infection. The lowest concentration of SARS-CoV-2 viral copies this assay can detect is 138 copies/mL. A negative result does not preclude SARS-Cov-2 infection and should not be used as the sole basis for treatment or other patient management decisions. A negative result may occur with  improper specimen collection/handling, submission of specimen other than nasopharyngeal swab, presence of viral mutation(s) within the areas targeted by this assay, and inadequate number of viral copies(<138 copies/mL). A negative result must be combined with clinical observations, patient history, and epidemiological information. The expected result is Negative.  Fact Sheet for Patients:  BloggerCourse.com  Fact Sheet for Healthcare Providers:  SeriousBroker.it  This test is no t yet approved or cleared by the [place] FDA and  has been authorized for detection and/or diagnosis of SARS-CoV-2 by FDA under an Emergency Use Authorization (EUA). This EUA will remain  in effect (meaning this test can be used) for the duration of the COVID-19 declaration under Section 564(b)(1) of the Act, 21 U.S.C.section 360bbb-3(b)(1), unless the authorization is terminated  or revoked sooner.       Influenza A by PCR NEGATIVE NEGATIVE Final   Influenza B by PCR NEGATIVE NEGATIVE Final    Comment: (NOTE) The Xpert Xpress SARS-CoV-2/FLU/RSV plus assay is intended as an aid in the diagnosis of influenza from Nasopharyngeal swab specimens and should not be used as a sole basis for treatment. Nasal washings and aspirates are unacceptable for Xpert Xpress SARS-CoV-2/FLU/RSV testing.  Fact Sheet for Patients: BloggerCourse.com  Fact Sheet for Healthcare  Providers: SeriousBroker.it  This test is not yet approved or cleared by the [place] FDA and has been authorized for detection and/or diagnosis of SARS-CoV-2 by FDA under an Emergency Use Authorization (EUA). This EUA will remain in effect (meaning this test can be used) for the duration of the COVID-19 declaration under Section 564(b)(1) of the Act, 21 U.S.C. section 360bbb-3(b)(1), unless the authorization is terminated or revoked.     Resp Syncytial Virus by PCR POSITIVE (A) NEGATIVE Final    Comment: (NOTE) Fact Sheet for Patients: BloggerCourse.com  Fact Sheet for Healthcare Providers: SeriousBroker.it  This test is not yet approved or cleared by the [place] FDA and has been authorized for detection and/or diagnosis of SARS-CoV-2 by FDA under an Emergency Use Authorization (EUA). This EUA will  remain in effect (meaning this test can be used) for the duration of the COVID-19 declaration under Section 564(b)(1) of the Act, 21 U.S.C. section 360bbb-3(b)(1), unless the authorization is terminated or revoked.  Performed at [facility], [address] [place],  [address]     Coagulation Studies: Recent Labs    09/12/22 0201  LABPROT 19.2*  INR 1.6*     Urinalysis: No results for input(s): "COLORURINE", "LABSPEC", "PHURINE", "GLUCOSEU", "HGBUR", "BILIRUBINUR", "KETONESUR", "PROTEINUR", "UROBILINOGEN", "NITRITE", "LEUKOCYTESUR" in the last 72 hours.  Invalid input(s): "APPERANCEUR"    Imaging: CARDIAC CATHETERIZATION  Result Date: 09/12/2022   Hemodynamic findings consistent with mild pulmonary hypertension. ~ Mild-Moderate Pulm HTN: PAP 59/12 - mean 32 mmHg; PCWP 19/26- 16 mmHg AoSat 96%l PA Sat 58%: CO-CI (Fick) 4.23-2.43; thermodilution is 3.11-1.57 Plan -- Defer ongoing management to CHF Team Glenetta Hew, MD    Medications:    sodium chloride Stopped  (08/24/22 1617)   sodium chloride     sodium chloride     furosemide (LASIX) 200 mg in dextrose 5 % 100 mL (2 mg/mL) infusion 5 mg/hr (09/12/22 0417)    amiodarone  100 mg Oral Daily   apixaban  2.5 mg Oral BID   aspirin EC  81 mg Oral Daily   atorvastatin  80 mg Oral QHS   brinzolamide  1 drop Both Eyes TID   And   brimonidine  1 drop Both Eyes TID   Chlorhexidine Gluconate Cloth  6 each Topical Daily   collagenase   Topical Daily   cyanocobalamin  1,000 mcg Oral Daily   hydrALAZINE  10 mg Oral Q8H   insulin aspart  0-5 Units Subcutaneous QHS   insulin aspart  0-9 Units Subcutaneous TID WC   insulin aspart  3 Units Subcutaneous TID WC   isosorbide mononitrate  30 mg Oral Daily   metoprolol succinate  50 mg Oral Daily   mometasone-formoterol  2 puff Inhalation BID   pantoprazole  40 mg Oral Daily   polyethylene glycol  17 g Oral BID   sodium chloride flush  3 mL Intravenous Q12H   sodium chloride flush  3 mL Intravenous Q12H   timolol  1 drop Both Eyes QHS   Travoprost (BAK Free)  1 drop Both Eyes QHS   sodium chloride, sodium chloride, sodium chloride, acetaminophen, albuterol, dextromethorphan-guaiFENesin, ipratropium-albuterol, methocarbamol, nitroGLYCERIN, ondansetron (ZOFRAN) IV, oxyCODONE, sodium chloride flush, sodium chloride flush  Assessment/ Plan:  [name] is a 82 y.o.  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  Baseline GFR appears to be between 35 and 37.  Renal function has returned to  baseline. Good urine output recorded. IV contrast exposure  on 09/12/22. Will monitor for any undesired effects.     Lab Results  Component Value Date   CREATININE 1.82 (H) 09/13/2022   CREATININE 1.97 (H) 09/12/2022   CREATININE 2.10 (H) 09/11/2022    Intake/Output Summary (Last 24 hours) at 09/13/2022 1108 Last data filed at 09/13/2022 0929 Gross per 24 hour  Intake 240 ml  Output 3100 ml  Net -  2860 ml    2. .  Acute on chronic systolic heart failure.  Echo completed on 08/28/2022 shows EF 40 to 45% with grade 2 diastolic dysfunction.  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  Also notes aortic calcification with stenosis.  Cardiology following.  Repeat echo 09/07/2022 shows EF 35 to 40%.  Furosemide drip remains at 5mg /hr. RHC completed on 09/12/22, mild to moderate pulmonary hypertension noted.     LOS: [place] 1/19/202411:08 AM

## 2022-09-14 DIAGNOSIS — N170 Acute kidney failure with tubular necrosis: Secondary | ICD-10-CM | POA: Diagnosis not present

## 2022-09-14 DIAGNOSIS — I214 Non-ST elevation (NSTEMI) myocardial infarction: Secondary | ICD-10-CM | POA: Diagnosis not present

## 2022-09-14 DIAGNOSIS — I10 Essential (primary) hypertension: Secondary | ICD-10-CM | POA: Diagnosis not present

## 2022-09-14 DIAGNOSIS — B338 Other specified viral diseases: Secondary | ICD-10-CM | POA: Diagnosis not present

## 2022-09-14 DIAGNOSIS — I5043 Acute on chronic combined systolic (congestive) and diastolic (congestive) heart failure: Secondary | ICD-10-CM | POA: Diagnosis not present

## 2022-09-14 DIAGNOSIS — L03119 Cellulitis of unspecified part of limb: Secondary | ICD-10-CM | POA: Diagnosis not present

## 2022-09-14 DIAGNOSIS — I480 Paroxysmal atrial fibrillation: Secondary | ICD-10-CM | POA: Diagnosis not present

## 2022-09-14 DIAGNOSIS — I509 Heart failure, unspecified: Secondary | ICD-10-CM | POA: Diagnosis not present

## 2022-09-14 LAB — BASIC METABOLIC PANEL
Anion gap: 10 (ref 5–15)
BUN: 47 mg/dL — ABNORMAL HIGH (ref 8–23)
CO2: 26 mmol/L (ref 22–32)
Calcium: 8.6 mg/dL — ABNORMAL LOW (ref 8.9–10.3)
Chloride: 101 mmol/L (ref 98–111)
Creatinine, Ser: 1.58 mg/dL — ABNORMAL HIGH (ref 0.61–1.24)
GFR, Estimated: 44 mL/min — ABNORMAL LOW (ref >60)
Glucose, Bld: 128 mg/dL — ABNORMAL HIGH (ref 70–99)
Potassium: 3.6 mmol/L (ref 3.5–5.1)
Sodium: 137 mmol/L (ref 135–145)

## 2022-09-14 LAB — GLUCOSE, CAPILLARY
Glucose-Capillary: 133 mg/dL — ABNORMAL HIGH (ref 70–99)
Glucose-Capillary: 238 mg/dL — ABNORMAL HIGH (ref 70–99)
Glucose-Capillary: 116 mg/dL — ABNORMAL HIGH (ref 70–99)
Glucose-Capillary: 161 mg/dL — ABNORMAL HIGH (ref 70–99)

## 2022-09-14 LAB — LIPOPROTEIN A (LPA): Lipoprotein (a): 41 nmol/L — ABNORMAL HIGH (ref <75.0)

## 2022-09-14 NOTE — Progress Notes (Signed)
Mobility Specialist - Progress Note   09/14/22 1356  Mobility  Activity Ambulated with assistance in hallway;Stood at bedside;Dangled on edge of bed  Level of Assistance Standby assist, set-up cues, supervision of patient - no hands on  Assistive Device Front wheel walker  Distance Ambulated (ft) 240 ft  Activity Response Tolerated well  Mobility Referral Yes  $Mobility charge 1 Mobility   Pt supine in bed on RA upon arrival. Pt STS and ambulates in hallway Supervision. Pt takes one standing rest break about 200 ft into ambulation. Pt returns to bed with needs in reach and bed alarm set.   Gretchen Short  Mobility Specialist  09/14/22 1:57 PM

## 2022-09-14 NOTE — Progress Notes (Signed)
Progress Note  Patient Name: Bradley [name] Date of Encounter: 09/14/2022  Primary Cardiologist: [name]  Subjective   No chest pain.  Dyspnea significantly improved.  Renal function continues to improve.  Documented urine output 11.3 L for the admission with minimal output documented over the past 24 hours.  Inpatient Medications    Scheduled Meds:  amiodarone  100 mg Oral Daily   apixaban  2.5 mg Oral BID   aspirin EC  81 mg Oral Daily   atorvastatin  80 mg Oral QHS   brinzolamide  1 drop Both Eyes TID   And   brimonidine  1 drop Both Eyes TID   Chlorhexidine Gluconate Cloth  6 each Topical Daily   collagenase   Topical Daily   cyanocobalamin  1,000 mcg Oral Daily   empagliflozin  10 mg Oral Daily   hydrALAZINE  10 mg Oral Q8H   insulin aspart  0-5 Units Subcutaneous QHS   insulin aspart  0-9 Units Subcutaneous TID WC   insulin aspart  3 Units Subcutaneous TID WC   isosorbide mononitrate  30 mg Oral Daily   metoprolol succinate  50 mg Oral Daily   mometasone-formoterol  2 puff Inhalation BID   pantoprazole  40 mg Oral Daily   polyethylene glycol  17 g Oral BID   sodium chloride flush  3 mL Intravenous Q12H   sodium chloride flush  3 mL Intravenous Q12H   timolol  1 drop Both Eyes QHS   torsemide  40 mg Oral Daily   Travoprost (BAK Free)  1 drop Both Eyes QHS   Continuous Infusions:  sodium chloride Stopped (08/24/22 1617)   sodium chloride     sodium chloride     PRN Meds: sodium chloride, sodium chloride, sodium chloride, acetaminophen, albuterol, dextromethorphan-guaiFENesin, ipratropium-albuterol, methocarbamol, nitroGLYCERIN, ondansetron (ZOFRAN) IV, oxyCODONE, sodium chloride flush, sodium chloride flush   Vital Signs    Vitals:   09/14/22 0131 09/14/22 0429 09/14/22 0433 09/14/22 0740  BP: (!) 114/51 (!) 109/53  113/67  Pulse: (!) 57 (!) 59  64  Resp: 18 18  18   Temp: 98.1 F (36.7 C) 98.4 F (36.9 C)  97.7 F (36.5 C)  TempSrc:  Oral    SpO2: 97%  97%  98%  Weight:   72.9 kg   Height:        Intake/Output Summary (Last 24 hours) at 09/14/2022 1058 Last data filed at 09/14/2022 0828 Gross per 24 hour  Intake 363 ml  Output 700 ml  Net -337 ml   Filed Weights   09/12/22 0832 09/13/22 0731 09/14/22 0433  Weight: 77.2 kg 76.8 kg 72.9 kg    Telemetry    Sinus rhythm - Personally Reviewed  ECG    No new tracings - Personally Reviewed  Physical Exam   GEN: No acute distress.   Neck: No JVD. Cardiac: RRR, no murmurs, rubs, or gallops.  Respiratory: Mildly diminished along the bases bilaterally.  GI: Soft, nontender, non-distended.   MS: No edema; No deformity. Neuro:  Alert and oriented x 3; Nonfocal.  Psych: Normal affect.  Labs    Chemistry Recent Labs  Lab 09/12/22 0201 09/12/22 1719 09/12/22 1720 09/13/22 0540 09/14/22 0629  NA 139   < > 139 139 137  K 3.5   < > 3.6 3.3* 3.6  CL 101  --   --  101 101  CO2 28  --   --  29 26  GLUCOSE 153*  --   --  148* 128*  BUN 73*  --   --  56* 47*  CREATININE 1.97*  --   --  1.82* 1.58*  CALCIUM 8.4*  --   --  8.5* 8.6*  GFRNONAA 34*  --   --  37* 44*  ANIONGAP 10  --   --  9 10   < > = values in this interval not displayed.     Hematology Recent Labs  Lab 09/11/22 0513 09/12/22 0201 09/12/22 1719 09/12/22 1720 09/13/22 0540  WBC 8.6 8.5  --   --  11.0*  RBC 3.88* 3.66*  --   --  3.98*  HGB 11.6* 10.6* 12.2* 11.9* 11.6*  HCT 36.9* 34.4* 36.0* 35.0* 37.5*  MCV 95.1 94.0  --   --  94.2  MCH 29.9 29.0  --   --  29.1  MCHC 31.4 30.8  --   --  30.9  RDW 22.0* 21.8*  --   --  21.6*  PLT 269 246  --   --  233    Cardiac EnzymesNo results for input(s): "TROPONINI" in the last 168 hours. No results for input(s): "TROPIPOC" in the last 168 hours.   BNP Recent Labs  Lab 09/13/22 0540  BNP >4,500.0*     DDimer No results for input(s): "DDIMER" in the last 168 hours.   Radiology     Cardiac Studies   RHC 09/12/2022:   Hemodynamic findings consistent  with mild pulmonary hypertension.   ~ Mild-Moderate Pulm HTN: PAP 59/12 - mean 32 mmHg; PCWP 19/26- 16 mmHg AoSat 96%l PA Sat 58%:  CO-CI (Fick) 4.23-2.43; thermodilution is 3.11-1.57    Plan -- Defer ongoing management to CHF Team __________  2D echo 09/07/2022:  1. Left ventricular ejection fraction, by estimation, is 35 to 40%. The  left ventricle has moderately decreased function. The left ventricle  demonstrates global hypokinesis. There is moderate asymmetric left  ventricular hypertrophy of the basal-septal  segment.   2. Right ventricular systolic function is moderately reduced. The right  ventricular size is mildly enlarged.   3. The inferior vena cava is dilated in size with <50% respiratory  variability, suggesting right atrial pressure of 15 mmHg.   Comparison(s): No significant change from prior study.  __________  2D echo 08/28/2022: 1. Left ventricular ejection fraction, by estimation, is 40 to 45%. The  left ventricle has mildly decreased function. The left ventricle  demonstrates global hypokinesis with severe hypokinesis of the bassal  anterior and anteroseptal wall, and inferior  wall. Left ventricular diastolic parameters are consistent with Grade II  diastolic dysfunction (pseudonormalization). There is the interventricular  septum is flattened in systole and diastole, consistent with right  ventricular pressure and volume  overload.   2. Right ventricular systolic function is normal. The right ventricular  size is normal. There is moderately elevated pulmonary artery systolic  pressure. The estimated right ventricular systolic pressure is 49.9 mmHg.   3. Left atrial size was mildly dilated.   4. Right atrial size was mildly dilated.   5. The mitral valve is normal in structure. Mild to moderate mitral valve  regurgitation. No evidence of mitral stenosis.   6. Tricuspid valve regurgitation is mild to moderate.   7. The aortic valve is tricuspid. There is  moderate calcification of the  aortic valve. Aortic valve regurgitation is mild to moderate. Mild aortic  valve stenosis by Aortic valve mean gradient measures 14.3 mmHg. Aortic  valve Vmax measures 2.54  m/s.Moderate stenosis by  Aortic valve area, by VTI measures 0.89 cm.   8. The inferior vena cava is normal in size with greater than 50%  respiratory variability, suggesting right atrial pressure of 3 mmHg.   Patient Profile     82 y.o. male with history of CAD s/p CABG, HFrEF, moderate aortic stenosis, persistent atrial fibrillation, CKD stage III, hypertension, diabetes, PAD, who was admitted 08/22/22 for acute on chronic systolic heart failure and acute kidney injury. 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   Assessment & Plan    1. Acute on chronic systolic heart failure exacerbation  - TTE on 09/07/22 with LVEF 35%-40% - Continue hydralazine 10mg  TID, imdur 30mg , toprol 50, Jardiance 10 mg, and torsemide 40 mg daily.  - RHC 09/12/2022 with moderate to severely reduced cardiac index, mildly elevated PA mean and normal filling pressures; at this time will avoid home inotropes due to multiple co-morbidities and frailty.  - Possible addition of digoxin outpatient if renal function continues to improve. - AHF will consider addition of ARB outpatient. Plan for follow up in 1 week in HF clinic at [facility].    2. AKI on CKD  - Baseline sCr of 1.38 on 03/14/22; up to 2.8 on admission  - Reviewed [facility] records; sCr at low as 1.5 there, however, 1.9 at discharge (08/09/22) - Net negative 11L since admit. sCr continuing to improve. - Appreciate nephrology assistance   3. CAD s/p CABG  - Angiography at [facility] w/ patent LIMA-LAD, SVG-OM and severe SVG-R PLV disease not amenable to PCI; otherwise, severe native vessel CAD that was stable.    4. Paroxysmal atrial  fibrillation  - Maintaining sinus rhythm - Amiodarone 100mg  daily, metop succinate 50mg  and apixaban (currently meets reduced dosing criteria)   5. Moderate aortic stenosis - Evaluated recently at [facility] for low flow low gradient; dobutamine stress echo negative for LFLG. Continue outpatient monitoring.    4. T2DM - A1C 7.8 - Followed by internal medicine    For questions or updates, please contact [place] Please consult [email or web address] for contact info under Cardiology/STEMI.    Signed, Christell Faith, PA-C [facility] Pager: [phone] 09/14/2022, 10:58 AM

## 2022-09-14 NOTE — Progress Notes (Signed)
[facility]  ROUNDING NOTE   Subjective:   Patient sitting up in bed Alert and oriented Denies pain and discomfort Remains on room air  Lower extremity edema continues to improve.  Feels that torsemide is helping , wanting to see its effects before discharge   Objective:  Vital signs in last 24 hours:  Temp:  [97.7 F (36.5 C)-98.8 F (37.1 C)] 97.7 F (36.5 C) (01/20 0740) Pulse Rate:  [56-64] 64 (01/20 0740) Resp:  [18] 18 (01/20 0740) BP: (105-114)/(51-67) 113/67 (01/20 0740) SpO2:  [97 %-99 %] 98 % (01/20 0740) Weight:  [72.9 kg] 72.9 kg (01/20 0433)  Weight change: -0.361 kg Filed Weights   09/12/22 0832 09/13/22 0731 09/14/22 0433  Weight: 77.2 kg 76.8 kg 72.9 kg    Intake/Output: I/O last 3 completed shifts: In: 483 [P.O.:480; I.V.:3] Out: 2000 [Urine:2000]   Intake/Output this shift:  Total I/O In: 120 [P.O.:120] Out: 700 [Urine:700]  Physical Exam: General: NAD  Head: Normocephalic, atraumatic.   Eyes: Anicteric  Lungs:  Clear to auscultation normal effort, room air  Heart: Regular rate and rhythm  Abdomen:  Soft, nontender, nondistended  Extremities: Trace peripheral edema.  Neurologic: Alert and oriented, moving all four extremities  Skin: No lesions  Access: None    Basic Metabolic Panel: Recent Labs  Lab 09/10/22 0515 09/11/22 0513 09/12/22 0201 09/12/22 1719 09/12/22 1720 09/13/22 0540 09/14/22 0629  NA 134* 135 139 139 139 139 137  K 4.9 4.3 3.5 3.6 3.6 3.3* 3.6  CL 98 100 101  --   --  101 101  CO2 27 25 28   --   --  29 26  GLUCOSE 146* 144* 153*  --   --  148* 128*  BUN 89* 85* 73*  --   --  56* 47*  CREATININE 2.48* 2.10* 1.97*  --   --  1.82* 1.58*  CALCIUM 8.7* 8.8* 8.4*  --   --  8.5* 8.6*  MG 2.5* 2.5*  --   --   --   --   --      Liver Function Tests: No results for input(s): "AST", "ALT", "ALKPHOS", "BILITOT", "PROT", "ALBUMIN" in the last 168 hours. No results for input(s): "LIPASE", "AMYLASE" in the  last 168 hours. No results for input(s): "AMMONIA" in the last 168 hours.  CBC: Recent Labs  Lab 09/10/22 0515 09/11/22 0513 09/12/22 0201 09/12/22 1719 09/12/22 1720 09/13/22 0540  WBC 9.8 8.6 8.5  --   --  11.0*  HGB 11.0* 11.6* 10.6* 12.2* 11.9* 11.6*  HCT 35.6* 36.9* 34.4* 36.0* 35.0* 37.5*  MCV 94.9 95.1 94.0  --   --  94.2  PLT 277 269 246  --   --  233     Cardiac Enzymes: No results for input(s): "CKTOTAL", "CKMB", "CKMBINDEX", "TROPONINI" in the last 168 hours.  BNP: Invalid input(s): "POCBNP"  CBG: Recent Labs  Lab 09/13/22 0728 09/13/22 1229 09/13/22 1516 09/13/22 2045 09/14/22 0740  GLUCAP 139* 109* 136* 219* 133*     Microbiology: Results for orders placed or performed during the hospital encounter of 08/22/22  Blood culture (routine x 2)     Status: None   Collection Time: 08/22/22  4:20 PM   Specimen: BLOOD  Result Value Ref Range Status   Specimen Description BLOOD BLOOD RIGHT WRIST  Final   Special Requests   Final    BOTTLES DRAWN AEROBIC AND ANAEROBIC Blood Culture adequate volume   Culture   Final  NO GROWTH 5 DAYS Performed at [facility], [address] [place], [place] [address]    Report Status 08/27/2022 FINAL  Final  Blood culture (routine x 2)     Status: None   Collection Time: 08/22/22  4:20 PM   Specimen: BLOOD  Result Value Ref Range Status   Specimen Description BLOOD BLOOD RIGHT FOREARM  Final   Special Requests   Final    BOTTLES DRAWN AEROBIC AND ANAEROBIC Blood Culture adequate volume   Culture   Final    NO GROWTH 5 DAYS Performed at [facility], [address], [place], [place] [address]    Report Status 08/27/2022 FINAL  Final  Resp panel by RT-PCR (RSV, Flu A&B, Covid)     Status: Abnormal   Collection Time: 09/02/22  4:51 PM  Result Value Ref Range Status   SARS Coronavirus 2 by RT PCR NEGATIVE NEGATIVE Final    Comment: (NOTE) SARS-CoV-2 target nucleic acids are NOT DETECTED.  The  SARS-CoV-2 RNA is generally detectable in upper respiratory specimens during the acute phase of infection. The lowest concentration of SARS-CoV-2 viral copies this assay can detect is 138 copies/mL. A negative result does not preclude SARS-Cov-2 infection and should not be used as the sole basis for treatment or other patient management decisions. A negative result may occur with  improper specimen collection/handling, submission of specimen other than nasopharyngeal swab, presence of viral mutation(s) within the areas targeted by this assay, and inadequate number of viral copies(<138 copies/mL). A negative result must be combined with clinical observations, patient history, and epidemiological information. The expected result is Negative.  Fact Sheet for Patients:  EntrepreneurPulse.com.au  Fact Sheet for Healthcare Providers:  IncredibleEmployment.be  This test is no t yet approved or cleared by the [place] FDA and  has been authorized for detection and/or diagnosis of SARS-CoV-2 by FDA under an Emergency Use Authorization (EUA). This EUA will remain  in effect (meaning this test can be used) for the duration of the COVID-19 declaration under Section 564(b)(1) of the Act, 21 U.S.C.section 360bbb-3(b)(1), unless the authorization is terminated  or revoked sooner.       Influenza A by PCR NEGATIVE NEGATIVE Final   Influenza B by PCR NEGATIVE NEGATIVE Final    Comment: (NOTE) The Xpert Xpress SARS-CoV-2/FLU/RSV plus assay is intended as an aid in the diagnosis of influenza from Nasopharyngeal swab specimens and should not be used as a sole basis for treatment. Nasal washings and aspirates are unacceptable for Xpert Xpress SARS-CoV-2/FLU/RSV testing.  Fact Sheet for Patients: EntrepreneurPulse.com.au  Fact Sheet for Healthcare Providers: IncredibleEmployment.be  This test is not yet approved or  cleared by the [place] FDA and has been authorized for detection and/or diagnosis of SARS-CoV-2 by FDA under an Emergency Use Authorization (EUA). This EUA will remain in effect (meaning this test can be used) for the duration of the COVID-19 declaration under Section 564(b)(1) of the Act, 21 U.S.C. section 360bbb-3(b)(1), unless the authorization is terminated or revoked.     Resp Syncytial Virus by PCR POSITIVE (A) NEGATIVE Final    Comment: (NOTE) Fact Sheet for Patients: EntrepreneurPulse.com.au  Fact Sheet for Healthcare Providers: IncredibleEmployment.be  This test is not yet approved or cleared by the [place] FDA and has been authorized for detection and/or diagnosis of SARS-CoV-2 by FDA under an Emergency Use Authorization (EUA). This EUA will remain in effect (meaning this test can be used) for the duration of the COVID-19 declaration under  Section 564(b)(1) of the Act, 21 U.S.C. section 360bbb-3(b)(1), unless the authorization is terminated or revoked.  Performed at [facility], [address] [place], [place] [address]     Coagulation Studies: Recent Labs    09/12/22 0201  LABPROT 19.2*  INR 1.6*     Urinalysis: No results for input(s): "COLORURINE", "LABSPEC", "PHURINE", "GLUCOSEU", "HGBUR", "BILIRUBINUR", "KETONESUR", "PROTEINUR", "UROBILINOGEN", "NITRITE", "LEUKOCYTESUR" in the last 72 hours.  Invalid input(s): "APPERANCEUR"    Imaging: CARDIAC CATHETERIZATION  Result Date: 09/12/2022   Hemodynamic findings consistent with mild pulmonary hypertension. ~ Mild-Moderate Pulm HTN: PAP 59/12 - mean 32 mmHg; PCWP 19/26- 16 mmHg AoSat 96%l PA Sat 58%: CO-CI (Fick) 4.23-2.43; thermodilution is 3.11-1.57 Plan -- Defer ongoing management to CHF Team Bryan Lemma, MD    Medications:    sodium chloride Stopped (08/24/22 1617)   sodium chloride     sodium chloride      amiodarone  100 mg Oral Daily    apixaban  2.5 mg Oral BID   aspirin EC  81 mg Oral Daily   atorvastatin  80 mg Oral QHS   brinzolamide  1 drop Both Eyes TID   And   brimonidine  1 drop Both Eyes TID   Chlorhexidine Gluconate Cloth  6 each Topical Daily   collagenase   Topical Daily   cyanocobalamin  1,000 mcg Oral Daily   empagliflozin  10 mg Oral Daily   hydrALAZINE  10 mg Oral Q8H   insulin aspart  0-5 Units Subcutaneous QHS   insulin aspart  0-9 Units Subcutaneous TID WC   insulin aspart  3 Units Subcutaneous TID WC   isosorbide mononitrate  30 mg Oral Daily   metoprolol succinate  50 mg Oral Daily   mometasone-formoterol  2 puff Inhalation BID   pantoprazole  40 mg Oral Daily   polyethylene glycol  17 g Oral BID   sodium chloride flush  3 mL Intravenous Q12H   sodium chloride flush  3 mL Intravenous Q12H   timolol  1 drop Both Eyes QHS   torsemide  40 mg Oral Daily   Travoprost (BAK Free)  1 drop Both Eyes QHS   sodium chloride, sodium chloride, sodium chloride, acetaminophen, albuterol, dextromethorphan-guaiFENesin, ipratropium-albuterol, methocarbamol, nitroGLYCERIN, ondansetron (ZOFRAN) IV, oxyCODONE, sodium chloride flush, sodium chloride flush  Assessment/ Plan:  [name] is a 81 y.o.  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  Baseline GFR appears to be between 35 and 37.  Renal function has returned to baseline. Good urine output recorded. IV contrast exposure  on 09/12/22. Will monitor for any undesired effects. Creatinine 1.6.      Lab Results  Component Value Date   CREATININE 1.58 (H) 09/14/2022   CREATININE 1.82 (H) 09/13/2022   CREATININE 1.97 (H) 09/12/2022    Intake/Output Summary (Last 24 hours) at 09/14/2022 1038 Last data filed at 09/14/2022 0828 Gross per 24 hour  Intake 363 ml  Output 700 ml  Net -337 ml    2. .  Acute on chronic systolic heart failure.  Echo completed on  08/28/2022 shows EF 40 to 45% with grade 2 diastolic dysfunction.  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  Also notes aortic calcification with stenosis.  Cardiology following.  Repeat echo 09/07/2022 shows EF 35 to 40%. RHC completed on 09/12/22, mild to moderate pulmonary hypertension noted. Transitioned to torsemide     LOS: [place] 1/20/202410:38 AM

## 2022-09-14 NOTE — Progress Notes (Signed)
Progress Note   Patient: Bradley [name] POE:[id number] DOB: 1941-07-06 DOA: 08/22/2022     23 DOS: the patient was seen and examined on 09/14/2022   Brief hospital course: 82 y.o. male with past medical history significant of systolic CHF with EF of 31-54%, moderate aortic stenosis hypertension, diabetes mellitus, CAD with CABG and PVD, who presented to ED on 08/22/22 with shortness of breath and worsening leg edema.  Patient admitted for acute systolic heart failure.    Following admission, patient underwent debridement of his right foot by podiatry.  Patient underwent lower extremity angiogram with stent placement by vascular surgery patient's renal function worsened and nephrology consulted.  On 1/3, patient had chest pain with troponins peaking to 2500s.  Cardiology following, felt to be non-STEMI.  Felt to be secondary to decompensated heart failure despite doses of IV Lasix and started on Lasix drip.  Hospital course also complicated by development of RSV on 1/8.  Since hospitalization, patient has diuresed almost 7 L and is more than -5 L deficient and is down over 20 pounds from admission.  Patient went for right-sided heart catheterization 1/18 noting moderate to severe reduced cardiac index with mild elevated PA pressure.   Assessment and Plan: RSV infection and bronchitis-resolved. Supportive treatment.  Placed on antibiotics due to elevated procalcitonin level.  On steroids and bronchodilators.  Now back to baseline.   Acute on chronic combined systolic and diastolic CHF (congestive heart failure) (HCC) Moderate pulmonary hypertension Elevated troponin secondary to chf.  2D echo on 03/12/2022 showed EF of 40-45% with grade 2 diastolic dysfunction.  BNP> 4500. Patient initially was giving IV Lasix, renal function much worse afterwards.  After recurrent Lasix and worsening renal function, patient was finally placed on Lasix drip on 1/14.  Currently feels better.  Renal function also  improving on Lasix.  Some lightheadedness so renal function improving.  Lasix drip discontinued and starting on Jardiance plus daily torsemide.  Cardiology continuing to follow.    AKI superimposed on stage 3a chronic kidney disease ([place]):  Hyperkalemia. Baseline creatinine 1.38 on 03/14/2022.   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 --Podiatry --> saw patient 12/30 and performed excisional debridement of the ulcerative areas on the right forefoot.  Plan: --cont dressing change per order   PVD (peripheral vascular disease) ([place]) S/p LE angiography w/ stent to R external iliac artery on 08/28/22 --pt has improved perfusion after the stent placement. Continue statin and aspirin.   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  --chest pain morning of 1/3 with increasing trop from 72 to 2572, trending down since. Completed course of heparin drip, continue aspirin and statin.  Followed by cardiology. Repeat troponin on 1/17 and 1/18 checked and has started to trend back upward (750) up from essentially normal 5 days prior.  Discussed with cardiology and they will continue to monitor.  Troponins continue to decrease.  Right heart catheterization notes moderate to severely reduced cardiac index.  Patient to be started on Jardiance.   Moderate aortic stenosis Recent echo done showed moderate aortic stenosis  at [facility] TAVR is not indicated at this time and cardiology is following   Type II diabetes mellitus with renal  manifestations [facility]):  Recent A1c 7.8.   Poorly controlled.   Continue current insulin regimen, resume home regimen at discharge.   Paroxysmal atrial fibrillation (HCC) s/p Cardioversion Amiodarone 100 mg po daily and Metoprolol Succinate 50 mg po Daily --cont Eliquis   Normocytic Anemia --No overt bleeding noted monitor CBC         Subjective:  Continues to feel better.  Physical Exam: Vitals:   09/14/22 0429 09/14/22 0433 09/14/22 0740 09/14/22 1148  BP: (!) 109/53  113/67 (!) 106/52  Pulse: (!) 59  64 62  Resp: 18  18   Temp: 98.4 F (36.9 C)  97.7 F (36.5 C)   TempSrc: Oral     SpO2: 97%  98% 100%  Weight:  72.9 kg    Height:       General exam: Alert and oriented x 3 Respiratory system: Clear to auscultation bilaterally Cardiovascular system: Regular rate and rhythm, S1-S2, occasional ectopic beat Gastrointestinal system: Soft, nontender, distended, positive bowel sounds Central nervous system: No focal deficits Extremities: No clubbing or cyanosis or edema Skin: No rashes, lesions or ulcers Psychiatry: Appropriate, no evidence of psychoses  Data Reviewed: Creatinine down to 1.58  Family Communication: none  Disposition: Status is: Inpatient Remains inpatient appropriate because:  -Improvement in renal function -Continue diuresis, clearance by cardiology  Planned Discharge Destination: Home with Home Health     Author: Annita Brod, MD 09/14/2022 3:05 PM  For on call review [email or web address].

## 2022-09-15 DIAGNOSIS — I5043 Acute on chronic combined systolic (congestive) and diastolic (congestive) heart failure: Secondary | ICD-10-CM | POA: Diagnosis not present

## 2022-09-15 DIAGNOSIS — E663 Overweight: Secondary | ICD-10-CM | POA: Diagnosis not present

## 2022-09-15 DIAGNOSIS — B338 Other specified viral diseases: Secondary | ICD-10-CM | POA: Diagnosis not present

## 2022-09-15 DIAGNOSIS — L03119 Cellulitis of unspecified part of limb: Secondary | ICD-10-CM | POA: Diagnosis not present

## 2022-09-15 DIAGNOSIS — I480 Paroxysmal atrial fibrillation: Secondary | ICD-10-CM | POA: Diagnosis not present

## 2022-09-15 DIAGNOSIS — I509 Heart failure, unspecified: Secondary | ICD-10-CM | POA: Diagnosis not present

## 2022-09-15 DIAGNOSIS — I25118 Atherosclerotic heart disease of native coronary artery with other forms of angina pectoris: Secondary | ICD-10-CM | POA: Diagnosis not present

## 2022-09-15 LAB — BASIC METABOLIC PANEL
Anion gap: 9 (ref 5–15)
BUN: 44 mg/dL — ABNORMAL HIGH (ref 8–23)
CO2: 26 mmol/L (ref 22–32)
Calcium: 8.5 mg/dL — ABNORMAL LOW (ref 8.9–10.3)
Chloride: 102 mmol/L (ref 98–111)
Creatinine, Ser: 1.73 mg/dL — ABNORMAL HIGH (ref 0.61–1.24)
GFR, Estimated: 39 mL/min — ABNORMAL LOW (ref >60)
Glucose, Bld: 128 mg/dL — ABNORMAL HIGH (ref 70–99)
Potassium: 3.5 mmol/L (ref 3.5–5.1)
Sodium: 137 mmol/L (ref 135–145)

## 2022-09-15 LAB — GLUCOSE, CAPILLARY
Glucose-Capillary: 140 mg/dL — ABNORMAL HIGH (ref 70–99)
Glucose-Capillary: 171 mg/dL — ABNORMAL HIGH (ref 70–99)
Glucose-Capillary: 181 mg/dL — ABNORMAL HIGH (ref 70–99)
Glucose-Capillary: 140 mg/dL — ABNORMAL HIGH (ref 70–99)

## 2022-09-15 MED ORDER — POTASSIUM CHLORIDE CRYS ER 20 MEQ PO TBCR
40.0000 meq | EXTENDED_RELEASE_TABLET | Freq: Once | ORAL | Status: AC
  Administered 2022-09-15: 40 meq via ORAL
  Filled 2022-09-15: qty 2

## 2022-09-15 MED ORDER — EMPAGLIFLOZIN 10 MG PO TABS: 10.0000 mg | ORAL_TABLET | Freq: Every day | ORAL | 1 refills | Status: DC

## 2022-09-15 MED ORDER — POTASSIUM CHLORIDE CRYS ER 10 MEQ PO TBCR
10.0000 meq | EXTENDED_RELEASE_TABLET | Freq: Every day | ORAL | Status: DC
  Administered 2022-09-16: 10 meq via ORAL
  Filled 2022-09-15: qty 1

## 2022-09-15 MED ORDER — MOMETASONE FURO-FORMOTEROL FUM 200-5 MCG/ACT IN AERO: 2.0000 | INHALATION_SPRAY | Freq: Two times a day (BID) | RESPIRATORY_TRACT | 1 refills | Status: DC

## 2022-09-15 MED ORDER — ASPIRIN 81 MG PO TBEC: 81.0000 mg | DELAYED_RELEASE_TABLET | Freq: Every day | ORAL | 12 refills | Status: DC

## 2022-09-15 MED ORDER — TORSEMIDE 40 MG PO TABS: 40.0000 mg | ORAL_TABLET | Freq: Every day | ORAL | 1 refills | Status: DC

## 2022-09-15 MED ORDER — ISOSORBIDE MONONITRATE ER 30 MG PO TB24: 30.0000 mg | ORAL_TABLET | Freq: Every day | ORAL | 1 refills | Status: DC

## 2022-09-15 MED ORDER — HYDRALAZINE HCL 10 MG PO TABS: 5.0000 mg | ORAL_TABLET | Freq: Three times a day (TID) | ORAL | 2 refills | Status: DC

## 2022-09-15 MED ORDER — SPIRONOLACTONE 25 MG PO TABS
25.0000 mg | ORAL_TABLET | Freq: Every day | ORAL | Status: DC
  Administered 2022-09-15 – 2022-09-16 (×2): 25 mg via ORAL
  Filled 2022-09-15 (×2): qty 1

## 2022-09-15 NOTE — Progress Notes (Signed)
Mobility Specialist - Progress Note   09/15/22 1400  Mobility  Activity Ambulated with assistance in room  Level of Assistance Standby assist, set-up cues, supervision of patient - no hands on  Assistive Device Front wheel walker  Distance Ambulated (ft) 10 ft  Activity Response Tolerated well  Mobility Referral Yes  $Mobility charge 1 Mobility   Pt OOB in restroom upon arrival. Pt ambulates from restroom to bed Supervision. Pt left supine in bed with needs in reach and bed alarm set.   Gretchen Short  Mobility Specialist  09/15/22 2:17 PM

## 2022-09-15 NOTE — Discharge Summary (Addendum)
Physician Discharge Summary   Patient: Bradley [name] MRN: [id number] DOB: [birth year]  Admit date:     08/22/2022  Anticipated discharge date: 09/16/22  Discharge Physician: Annita Brod   PCP: System, Provider Not In   Recommendations at discharge:   Medication clarification: Patient on Eliquis 2.5 mg p.o. twice daily New medication: Aspirin 81 mg p.o. daily Medication change: Jardiance decreased to 10 mg p.o. daily Medication change: Lasix discontinued New medication: Hydralazine 5 mg every 8 hours New medication: Imdur 30 mg p.o. daily New medication: Dulera inhaler 2 puffs twice daily Medication change: Entresto discontinued New medication: Torsemide 40 mg p.o. daily Patient will follow-up in the next few weeks with outpatient cardiology as well as advanced heart failure clinic Patient will go home with home health PT OT and RN  Discharge Diagnoses: Principal Problem:   Acute on chronic combined systolic and diastolic CHF (congestive heart failure) ([place]) Active Problems:   Cellulitis of lower extremity   CAD (coronary artery disease)   Essential hypertension   Acute renal failure superimposed on stage 3a chronic kidney disease ([place])   HLD (hyperlipidemia)   Type II diabetes mellitus with renal manifestations (HCC)   Paroxysmal atrial fibrillation (HCC)   Hyperkalemia   PVD (peripheral vascular disease) (HCC)   Non-ST elevation (NSTEMI) myocardial infarction (HCC)   Acute on chronic HFrEF (heart failure with reduced ejection fraction) (HCC)   Acute on chronic congestive heart failure (HCC)   RSV (respiratory syncytial virus infection)   Pressure injury of skin   Elevated troponin   Moderate pulmonary hypertension (HCC)   Overweight (BMI 25.0-29.9)  Resolved Problems:   * No resolved hospital problems. [facility]: 82 y.o. male with past medical history significant of systolic CHF with EF of 95-63%, moderate aortic stenosis hypertension, diabetes mellitus,  CAD with CABG and PVD, who presented to ED on 08/22/22 with shortness of breath and worsening leg edema.  Patient admitted for acute systolic heart failure.    Following admission, patient underwent debridement of his right foot by podiatry.  Patient underwent lower extremity angiogram with stent placement by vascular surgery patient's renal function worsened and nephrology consulted.  On 1/3, patient had chest pain with troponins peaking to 2500s.  Cardiology following, felt to be non-STEMI.  Felt to be secondary to decompensated heart failure despite doses of IV Lasix and started on Lasix drip.  Hospital course also complicated by development of RSV on 1/8.  Since hospitalization, patient has diuresed almost 7 L and is more than -5 L deficient and is down over 20 pounds from admission.  Patient went for right-sided heart catheterization 1/18 noting moderate to severe reduced cardiac index with mild elevated PA pressure.  Assessment and Plan: RSV infection and bronchitis-resolved. Supportive treatment.  Placed on antibiotics due to elevated procalcitonin level.  On steroids and bronchodilators.  Now back to baseline.   Acute on chronic combined systolic and diastolic CHF (congestive heart failure) (HCC) Moderate pulmonary hypertension Elevated troponin secondary to chf.  2D echo on 03/12/2022 showed EF of 40-45% with grade 2 diastolic dysfunction.  BNP> 4500. Patient initially was giving IV Lasix, renal function much worse afterwards.  After recurrent Lasix and worsening renal function, patient was finally placed on Lasix drip on 1/14.  Currently feels better.  Renal function also improving on Lasix.  Some lightheadedness so renal function improving.  Lasix drip discontinued and starting on Jardiance plus daily torsemide.  Cardiology continuing to follow.     AKI  superimposed on stage 3a chronic kidney disease (HCC):  Hyperkalemia. Baseline creatinine 1.38 on 03/14/2022.   On Admission, Cr  2.84 Continue to improve.  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 --Podiatry --> saw patient 12/30 and performed excisional debridement of the ulcerative areas on the right forefoot.  Plan: --cont dressing change per order   PVD (peripheral vascular disease) (HCC) S/p LE angiography w/ stent to R external iliac artery on 08/28/22 --pt has improved perfusion after the stent placement. Continue statin and aspirin.   NSTEMI CAD (coronary artery disease status post CABG and prior PCI --underwent coronary angiography at [facility] less than a month ago, showing stable findings of severe native CAD, patent LIMA-LAD and SVG-OM, and patent SVG-R PLA with severe disease at the distal anastomosis that [facility].  --chest pain morning of 1/3 with increasing trop from 72 to 2572, trending down since. Completed course of heparin drip, continue aspirin and statin.  Followed by cardiology. Repeat troponin on 1/17 and 1/18 checked and has started to trend back upward (750) up from essentially normal 5 days prior.  Discussed with cardiology and they will continue to monitor.  Troponins continue to decrease.  Right heart catheterization notes moderate to severely reduced cardiac index.  Patient to be restarted on Jardiance at 10 mg p.o. daily   Moderate aortic stenosis [facility] T[facility]R is not indicated at this time and cardiology is following   Type II diabetes mellitus with renal manifestations [facility](HCC):  Recent A1c 7.8.   Poorly controlled.   Continue current insulin regimen, resume home regimen at discharge.   Paroxysmal atrial fibrillation (HCC) s/p Cardioversion Amiodarone 100 mg po daily and Metoprolol Succinate 50 mg po  Daily --cont Eliquis   Normocytic Anemia --No overt bleeding noted monitor CBC          Consultants:  -Podiatry -Vascular surgery -Cardiology -Nephrology  Procedures performed:  -Debridement of right foot by podiatry -Angiogram with stent placement by vascular surgery -Right heart catheterization 1/18  Disposition: Home with home health Diet recommendation:  Cardiac diet DISCHARGE MEDICATION: Allergies as of 09/15/2022       Reactions   Simvastatin Other (See Comments)   [facility]        Medication List     STOP taking these medications    clopidogrel 75 MG tablet Commonly known as: PLAVIX   furosemide 40 MG tablet Commonly known as: LASIX   HYDROcodone-acetaminophen 5-325 MG tablet Commonly known as: NORCO/VICODIN   sacubitril-valsartan 24-26 MG Commonly known as: ENTRESTO       TAKE these medications    acetaminophen 325 MG tablet Commonly known as: TYLENOL Take 650-1,300 mg by mouth 2 (two) times daily as needed.   amiodarone 200 MG tablet Commonly known as: PACERONE Take 100 mg by mouth daily.   apixaban 2.5 MG Tabs tablet Commonly known as: ELIQUIS Take 2.5 mg by mouth 2 (two) times daily. What changed: Another medication with the same name was removed. Continue taking this medication, and follow the directions you see here.   aspirin EC 81 MG tablet Take 1 tablet (81 mg total) by mouth daily. Swallow whole. Start taking on: September 16, 2022  atorvastatin 80 MG tablet Commonly known as: LIPITOR TAKE ONE TABLET BY MOUTH AT BEDTIME FOR CHOLESTEROL   Brinzolamide-Brimonidine 1-0.2 % Susp Place 1 drop into the left eye 3 (three) times daily.   carboxymethylcellulose 1 % ophthalmic solution Apply 1 drop to eye at bedtime.   cyanocobalamin 500 MCG tablet Commonly known as: VITAMIN B12 Take 1,000 mcg by mouth daily. Before breakfast   empagliflozin 10 MG Tabs  tablet Commonly known as: JARDIANCE Take 1 tablet (10 mg total) by mouth daily. Start taking on: September 16, 2022 What changed:  medication strength how much to take when to take this   hydrALAZINE 10 MG tablet Commonly known as: APRESOLINE Take 0.5 tablets (5 mg total) by mouth every 8 (eight) hours.   isosorbide mononitrate 30 MG 24 hr tablet Commonly known as: IMDUR Take 1 tablet (30 mg total) by mouth daily. Start taking on: September 16, 2022 What changed:  medication strength how much to take   metoprolol succinate 50 MG 24 hr tablet Commonly known as: TOPROL-XL Take by mouth.   mometasone-formoterol 200-5 MCG/ACT Aero Commonly known as: DULERA Inhale 2 puffs into the lungs 2 (two) times daily.   nitroGLYCERIN 0.4 MG SL tablet Commonly known as: NITROSTAT Place 1 tablet (0.4 mg total) under the tongue every 5 (five) minutes x 3 doses as needed for chest pain.   pantoprazole 40 MG tablet Commonly known as: PROTONIX Take 40 mg by mouth daily.   polyethylene glycol 17 g packet Commonly known as: MIRALAX / GLYCOLAX Take 17 g by mouth daily.   Propylene Glycol 0.6 % Soln Place 1 drop into both eyes 4 (four) times daily.   spironolactone 25 MG tablet Commonly known as: ALDACTONE Take 1 tablet by mouth daily.   timolol 0.5 % ophthalmic gel-forming Commonly known as: TIMOPTIC-XR Place 1 drop into both eyes daily.   Torsemide 40 MG Tabs Take 40 mg by mouth daily. Start taking on: September 16, 2022   travoprost (benzalkonium) 0.004 % ophthalmic solution Commonly known as: TRAVATAN Place 1 drop into both eyes nightly.        Discharge Exam: Filed Weights   09/12/22 1610 09/13/22 0731 09/14/22 0433  Weight: 77.2 kg 76.8 kg 72.9 kg   General: Alert and oriented x 3, no acute distress Cardiovascular: Regular rate and rhythm, S1-S2, 2 out of 6 systolic ejection murmur Lungs: Clear to auscultation bilaterally  Condition at discharge: good  The results of  significant diagnostics from this hospitalization (including imaging, microbiology, ancillary and laboratory) are listed below for reference.   Imaging Studies: CARDIAC CATHETERIZATION  Result Date: 09/12/2022   Hemodynamic findings consistent with mild pulmonary hypertension. ~ Mild-Moderate Pulm HTN: PAP 59/12 - mean 32 mmHg; PCWP 19/26- 16 mmHg AoSat 96%l PA Sat 58%: CO-CI (Fick) 4.23-2.43; thermodilution is 3.11-1.57 Plan -- Defer ongoing management to CHF Team Bryan Lemma, MD  ECHOCARDIOGRAM LIMITED  Result Date: 09/07/2022    ECHOCARDIOGRAM LIMITED REPORT   Patient Name:   SHUBHAM THACKSTON Date of Exam: 09/07/2022 Medical Rec #:  [id number]    Height:       72.0 in Accession #:    [id number]   Weight:       194.4 lb Date of Birth:  [birth year]    BSA:          2.105 m Patient Age:    81 years     BP:           119/63 mmHg Patient Gender: M  HR:           60 bpm. Exam Location:  [facility] Procedure: Limited Echo and Intracardiac Opacification Agent Indications:     CHF I50.21  History:         Patient has prior history of Echocardiogram examinations, most                  recent 08/28/2022.  Sonographer:     Kathlen Brunswick RDCS Referring Phys:  [id number] [place] Diagnosing Phys: Eleonore Chiquito MD  Sonographer Comments: Technically difficult study due to poor echo windows. Image acquisition challenging due to patient body habitus. IMPRESSIONS  1. Left ventricular ejection fraction, by estimation, is 35 to 40%. The left ventricle has moderately decreased function. The left ventricle demonstrates global hypokinesis. There is moderate asymmetric left ventricular hypertrophy of the basal-septal segment.  2. Right ventricular systolic function is moderately reduced. The right ventricular size is mildly enlarged.  3. The inferior vena cava is dilated in size with <50% respiratory variability, suggesting right atrial pressure of 15 mmHg. Comparison(s): No significant change from prior study. FINDINGS   Left Ventricle: Left ventricular ejection fraction, by estimation, is 35 to 40%. The left ventricle has moderately decreased function. The left ventricle demonstrates global hypokinesis. Definity contrast agent was given IV to delineate the left ventricular endocardial borders. The left ventricular internal cavity size was normal in size. There is moderate asymmetric left ventricular hypertrophy of the basal-septal segment. Right Ventricle: The right ventricular size is mildly enlarged. No increase in right ventricular wall thickness. Right ventricular systolic function is moderately reduced. Pericardium: There is no evidence of pericardial effusion. Venous: The inferior vena cava is dilated in size with less than 50% respiratory variability, suggesting right atrial pressure of 15 mmHg. Additional Comments: There is a small pleural effusion in the left lateral region.  LEFT VENTRICLE PLAX 2D LVIDd:         5.35 cm LVIDs:         4.25 cm LV PW:         1.00 cm LV IVS:        1.15 cm  LV Volumes (MOD) LV vol d, MOD A2C: 140.0 ml LV vol d, MOD A4C: 117.0 ml LV vol s, MOD A2C: 74.9 ml LV vol s, MOD A4C: 65.7 ml LV SV MOD A2C:     65.1 ml LV SV MOD A4C:     117.0 ml LV SV MOD BP:      59.1 ml LEFT ATRIUM         Index LA diam:    4.60 cm 2.19 cm/m Eleonore Chiquito MD Electronically signed by Eleonore Chiquito MD Signature Date/Time: 09/07/2022/5:29:51 PM    Final    DG Chest Port 1 View  Result Date: 09/06/2022 CLINICAL DATA:  Chest pain. EXAM: PORTABLE CHEST 1 VIEW COMPARISON:  09/05/2022 FINDINGS: Stable heart size status post prior CABG. Increase in pulmonary interstitial edema and bilateral pleural effusions, right greater than left. Associated bibasilar atelectasis. No pneumothorax or focal airspace consolidation. IMPRESSION: Increase in pulmonary interstitial edema and bilateral pleural effusions, right greater than left. Electronically Signed   By: Aletta Edouard M.D.   On: 09/06/2022 08:52   DG Chest 2  View  Result Date: 09/05/2022 CLINICAL DATA:  Possible pneumonia EXAM: CHEST - 2 VIEW COMPARISON:  Chest x-ray dated September 02, 2022 FINDINGS: Unchanged cardiomegaly. Prior median sternotomy and CABG. Similar interstitial opacities and small bilateral pleural effusions. Lower lung predominant linear opacities  are likely due to atelectasis. No evidence of pneumothorax. IMPRESSION: Similar interstitial opacities and small bilateral pleural effusions, likely due to pulmonary edema. Electronically Signed   By: Allegra Lai M.D.   On: 09/05/2022 17:55   DG Chest Port 1 View  Result Date: 09/02/2022 CLINICAL DATA:  Cough EXAM: PORTABLE CHEST 1 VIEW COMPARISON:  Previous studies including the examination of 09/01/2022 FINDINGS: Transverse diameter of heart is increased. There is previous coronary bypass surgery. Central pulmonary vessels are more prominent. These increase in interstitial markings in the parahilar regions and lower lung fields. There is blunting of costophrenic angles suggesting bilateral pleural effusions, more so on the right side. Degenerative changes are noted in both shoulders. IMPRESSION: Cardiomegaly. Central pulmonary vessels are more prominent suggesting worsening of CHF. Small bilateral pleural effusions, more so on the right side. Electronically Signed   By: Ernie Avena M.D.   On: 09/02/2022 11:37   DG Chest 2 View  Result Date: 09/01/2022 CLINICAL DATA:  Shortness of breath. EXAM: CHEST - 2 VIEW COMPARISON:  08/28/2022 FINDINGS: The cardio pericardial silhouette is enlarged. Vascular congestion noted with diffuse interstitial opacity. Patchy airspace disease noted in the left lung base with small bilateral pleural effusions evident on the lateral film. Bones are diffusely demineralized. Telemetry leads overlie the chest. IMPRESSION: 1. Enlargement of the cardiopericardial silhouette with vascular congestion and interstitial edema. Pulmonary edema pattern has decreased in the  interval. 2. Patchy airspace disease at the left base with small bilateral pleural effusions. Electronically Signed   By: [name] M.D.   On: 09/01/2022 11:45   MR ANKLE RIGHT W WO CONTRAST  Result Date: 08/31/2022 CLINICAL DATA:  Achilles tendinitis.  Pain over Achilles tendon. EXAM: MRI OF THE RIGHT ANKLE WITHOUT AND WITH CONTRAST TECHNIQUE: Multiplanar, multisequence MR imaging of the ankle was performed before and after the administration of intravenous contrast. CONTRAST:  74mL GADAVIST GADOBUTROL 1 MMOL/ML IV SOLN COMPARISON:  None Available. FINDINGS: TENDONS Peroneal: Intact and normally positioned. Posteromedial: Intact and normally positioned. Anterior: Intact and normally positioned. Achilles: Intact. Plantar Fascia: Minimal thickening of the central cord of the plantar fascia of with mild adjacent soft tissue enhancement and a small plantar calcaneal spur. No evidence of fascial rupture or marrow edema. LIGAMENTS Lateral: The anterior and posterior talofibular and calcaneofibular ligaments are intact.The inferior tibiofibular ligaments appear intact. Medial: The deltoid and visualized portions of the spring ligament appear intact. CARTILAGE AND BONES Ankle Joint: Cystic osteochondral lesion of the talar dome medially appears degenerative. No unstable osteochondral fragment identified. No significant ankle joint effusion or abnormal synovial enhancement. Subtalar Joints/Sinus Tarsi: Unremarkable. Bones: Minimal midfoot degenerative changes. No acute or significant extra-articular osseous findings. Other: 5 mm ganglion between the medial and middle cuneiform bones, likely incidental. Mild subcutaneous edema about the ankle without focal fluid collection or suspicious soft tissue enhancement. IMPRESSION: IMPRESSION 1. The Achilles tendon appears normal. 2. Possible mild plantar fasciitis. 3. Degenerative osteochondral lesion of the talar dome medially. No acute osseous findings. 4. The ankle tendons  and ligaments appear intact. Electronically Signed   By: Carey Bullocks M.D.   On: 08/31/2022 09:54   ECHOCARDIOGRAM COMPLETE  Result Date: 08/28/2022    ECHOCARDIOGRAM REPORT   Patient Name:   BRAXTEN MEMMER Date of Exam: 08/28/2022 Medical Rec #:  [id number]    Height:       72.0 in Accession #:    [id number]   Weight:       187.1 lb Date of Birth:  04/30/1941    BSA:          2.071 m Patient Age:    81 years     BP:           141/79 mmHg Patient Gender: M            HR:           87 bpm. Exam Location:  [facility] Procedure: 2D Echo, Cardiac Doppler, Color Doppler and Intracardiac            Opacification Agent Indications:     CHF-acute systolic I50.21  History:         Patient has prior history of Echocardiogram examinations, most                  recent 03/12/2022. Risk Factors:Diabetes and Hypertension. Heart                  failure with preserved ejection fraction.  Sonographer:     Cristela BlueJerry Hege Referring Phys:  [id number] CHRISTOPHER END Diagnosing Phys: Julien Nordmannimothy Gollan MD  Sonographer Comments: Suboptimal apical window. IMPRESSIONS  1. Left ventricular ejection fraction, by estimation, is 40 to 45%. The left ventricle has mildly decreased function. The left ventricle demonstrates global hypokinesis with severe hypokinesis of the bassal anterior and anteroseptal wall, and inferior wall. Left ventricular diastolic parameters are consistent with Grade II diastolic dysfunction (pseudonormalization). There is the interventricular septum is flattened in systole and diastole, consistent with right ventricular pressure and volume overload.  2. Right ventricular systolic function is normal. The right ventricular size is normal. There is moderately elevated pulmonary artery systolic pressure. The estimated right ventricular systolic pressure is 49.9 mmHg.  3. Left atrial size was mildly dilated.  4. Right atrial size was mildly dilated.  5. The mitral valve is normal in structure. Mild to moderate mitral valve regurgitation. No  evidence of mitral stenosis.  6. Tricuspid valve regurgitation is mild to moderate.  7. The aortic valve is tricuspid. There is moderate calcification of the aortic valve. Aortic valve regurgitation is mild to moderate. Mild aortic valve stenosis by Aortic valve mean gradient measures 14.3 mmHg. Aortic valve Vmax measures 2.54 m/s.Moderate stenosis by Aortic valve area, by VTI measures 0.89 cm.  8. The inferior vena cava is normal in size with greater than 50% respiratory variability, suggesting right atrial pressure of 3 mmHg. FINDINGS  Left Ventricle: Left ventricular ejection fraction, by estimation, is 40 to 45%. The left ventricle has mildly decreased function. The left ventricle demonstrates global hypokinesis. Definity contrast agent was given IV to delineate the left ventricular  endocardial borders. The left ventricular internal cavity size was normal in size. There is no left ventricular hypertrophy. The interventricular septum is flattened in systole and diastole, consistent with right ventricular pressure and volume overload. Left ventricular diastolic parameters are consistent with Grade II diastolic dysfunction (pseudonormalization). Right Ventricle: The right ventricular size is normal. No increase in right ventricular wall thickness. Right ventricular systolic function is normal. There is moderately elevated pulmonary artery systolic pressure. The tricuspid regurgitant velocity is 3.35 m/s, and with an assumed right atrial pressure of 5 mmHg, the estimated right ventricular systolic pressure is 49.9 mmHg. Left Atrium: Left atrial size was mildly dilated. Right Atrium: Right atrial size was mildly dilated. Pericardium: There is no evidence of pericardial effusion. Mitral Valve: The mitral valve is normal in structure. There is mild thickening of the mitral valve leaflet(s). Mild to moderate mitral valve regurgitation. No  evidence of mitral valve stenosis. Tricuspid Valve: The tricuspid valve is  normal in structure. Tricuspid valve regurgitation is mild to moderate. No evidence of tricuspid stenosis. Aortic Valve: The aortic valve is tricuspid. There is moderate calcification of the aortic valve. Aortic valve regurgitation is mild to moderate. Aortic regurgitation PHT measures 436 msec. Mild aortic stenosis is present. Aortic valve mean gradient measures 14.3 mmHg. Aortic valve peak gradient measures 25.9 mmHg. Aortic valve area, by VTI measures 0.89 cm. Pulmonic Valve: The pulmonic valve was normal in structure. Pulmonic valve regurgitation is trivial. No evidence of pulmonic stenosis. Aorta: The aortic root is normal in size and structure. Venous: The inferior vena cava is normal in size with greater than 50% respiratory variability, suggesting right atrial pressure of 3 mmHg. IAS/Shunts: No atrial level shunt detected by color flow Doppler.  LEFT VENTRICLE PLAX 2D LVIDd:         5.20 cm      Diastology LVIDs:         4.80 cm      LV e' lateral:   9.03 cm/s LV PW:         1.10 cm      LV E/e' lateral: 15.1 LV IVS:        1.10 cm LVOT diam:     2.00 cm LV SV:         43 LV SV Index:   21 LVOT Area:     3.14 cm  LV Volumes (MOD) LV vol d, MOD A2C: 156.0 ml LV vol d, MOD A4C: 177.0 ml LV vol s, MOD A2C: 71.9 ml LV vol s, MOD A4C: 81.9 ml LV SV MOD A2C:     84.1 ml LV SV MOD A4C:     177.0 ml LV SV MOD BP:      88.4 ml RIGHT VENTRICLE RV Basal diam:  4.60 cm RV Mid diam:    3.50 cm RV S prime:     7.51 cm/s TAPSE (M-mode): 0.9 cm LEFT ATRIUM             Index        RIGHT ATRIUM           Index LA diam:        4.80 cm 2.32 cm/m   RA Area:     16.20 cm LA Vol (A2C):   57.0 ml 27.52 ml/m  RA Volume:   38.70 ml  18.69 ml/m LA Vol (A4C):   66.7 ml 32.21 ml/m LA Biplane Vol: 64.8 ml 31.29 ml/m  AORTIC VALVE                     PULMONIC VALVE AV Area (Vmax):    0.73 cm      PR End Diast Vel: 7.40 msec AV Area (Vmean):   0.82 cm AV Area (VTI):     0.89 cm AV Vmax:           254.33 cm/s AV Vmean:           172.667 cm/s AV VTI:            0.482 m AV Peak Grad:      25.9 mmHg AV Mean Grad:      14.3 mmHg LVOT Vmax:         59.10 cm/s LVOT Vmean:        45.000 cm/s LVOT VTI:          0.136 m LVOT/AV VTI ratio:  0.28 AI PHT:            436 msec  AORTA Ao Root diam: 2.70 cm MITRAL VALVE                TRICUSPID VALVE MV Area (PHT): 5.23 cm     TR Peak grad:   44.9 mmHg MV Decel Time: 145 msec     TR Vmax:        335.00 cm/s MV E velocity: 136.00 cm/s MV A velocity: 52.40 cm/s   SHUNTS MV E/A ratio:  2.60         Systemic VTI:  0.14 m                             Systemic Diam: 2.00 cm Julien Nordmannimothy Gollan MD Electronically signed by Julien Nordmannimothy Gollan MD Signature Date/Time: 08/28/2022/8:30:12 AM    Final    DG Chest Port 1 View  Result Date: 08/28/2022 CLINICAL DATA:  Chest pain tonight EXAM: PORTABLE CHEST 1 VIEW COMPARISON:  08/22/2022 FINDINGS: Cardiomegaly and vascular pedicle widening. Prior CABG. Stable aortic contours. Diffuse hazy appearance of the chest with interstitial opacity from [facility]Kerley lines and small right pleural effusion. No pneumothorax. Artifact from EKG leads. IMPRESSION: CHF. Electronically Signed   By: Tiburcio PeaJonathan  Watts M.D.   On: 08/28/2022 04:14   PERIPHERAL VASCULAR CATHETERIZATION  Result Date: 08/27/2022 See surgical note for result.  US ARTERIAL ABI (SCREENING LOWER EXTREMITY)  Result Date: 08/23/2022 CLINICAL DATA:  Peripheral vascular disease. Right lower extremity rest pain. EXAM: NONINVASIVE PHYSIOLOGIC VASCULAR STUDY OF BILATERAL LOWER EXTREMITIES TECHNIQUE: Evaluation of both lower extremities were performed at rest, including calculation of ankle-brachial indices with single level Doppler, pressure and pulse volume recording. COMPARISON:  None Available. FINDINGS: Right ABI:  0.58 Left ABI:  1.04 Right Lower Extremity:  Irregular waveforms in the right ankle. Left Lower Extremity:  Irregular waveforms in the left ankle. 0.5-0.79 Moderate PAD IMPRESSION: 1. Right ABI is 0.58 and suggestive  for moderate peripheral arterial disease. 2. Normal left ABI, measuring 1.04. Electronically Signed   By: Richarda OverlieAdam  Henn M.D.   On: 08/23/2022 10:02   US Venous Img Lower Bilateral (DVT)  Result Date: 08/22/2022 CLINICAL DATA:  Cellulitis EXAM: BILATERAL LOWER EXTREMITY VENOUS DOPPLER ULTRASOUND TECHNIQUE: Gray-scale sonography with compression, as well as color and duplex ultrasound, were performed to evaluate the deep venous system(s) from the level of the common femoral vein through the popliteal and proximal calf veins. COMPARISON:  None Available. FINDINGS: VENOUS Normal compressibility of the common femoral, superficial femoral, and popliteal veins, as well as the visualized calf veins. Visualized portions of profunda femoral vein and great saphenous vein unremarkable. No filling defects to suggest DVT on grayscale or color Doppler imaging. Doppler waveforms show normal direction of venous flow, normal respiratory plasticity and response to augmentation. OTHER None. Limitations: none IMPRESSION: Negative. Electronically Signed   By: Charlett NoseKevin  Dover M.D.   On: 08/22/2022 19:08   DG Chest 2 View  Result Date: 08/22/2022 CLINICAL DATA:  History of congestive heart failure with dyspnea on exertion and bilateral leg edema EXAM: CHEST - 2 VIEW COMPARISON:  Chest radiograph dated 03/12/2022 FINDINGS: Low lung volumes. No focal consolidations. Trace bilateral pleural effusions. No pneumothorax. Similar postsurgical cardiomediastinal silhouette. Median sternotomy wires are nondisplaced. Surgical clips project over the mediastinum. IMPRESSION: Low lung volumes with trace bilateral pleural effusions. No focal consolidations. Electronically Signed   By: Milus HeightLimin  Xu M.D.  On: 08/22/2022 12:12    Microbiology: Results for orders placed or performed during the hospital encounter of 08/22/22  Blood culture (routine x 2)     Status: None   Collection Time: 08/22/22  4:20 PM   Specimen: BLOOD  Result Value Ref Range  Status   Specimen Description BLOOD BLOOD RIGHT WRIST  Final   Special Requests   Final    BOTTLES DRAWN AEROBIC AND ANAEROBIC Blood Culture adequate volume   Culture   Final    NO GROWTH 5 DAYS Performed at [facility], [address], [place], [place] [address]    Report Status 08/27/2022 FINAL  Final  Blood culture (routine x 2)     Status: None   Collection Time: 08/22/22  4:20 PM   Specimen: BLOOD  Result Value Ref Range Status   Specimen Description BLOOD BLOOD RIGHT FOREARM  Final   Special Requests   Final    BOTTLES DRAWN AEROBIC AND ANAEROBIC Blood Culture adequate volume   Culture   Final    NO GROWTH 5 DAYS Performed at [facility], [address], [place], [place] [address]    Report Status 08/27/2022 FINAL  Final  Resp panel by RT-PCR (RSV, Flu A&B, Covid)     Status: Abnormal   Collection Time: 09/02/22  4:51 PM  Result Value Ref Range Status   SARS Coronavirus 2 by RT PCR NEGATIVE NEGATIVE Final    Comment: (NOTE) SARS-CoV-2 target nucleic acids are NOT DETECTED.  The SARS-CoV-2 RNA is generally detectable in upper respiratory specimens during the acute phase of infection. The lowest concentration of SARS-CoV-2 viral copies this assay can detect is 138 copies/mL. A negative result does not preclude SARS-Cov-2 infection and should not be used as the sole basis for treatment or other patient management decisions. A negative result may occur with  improper specimen collection/handling, submission of specimen other than nasopharyngeal swab, presence of viral mutation(s) within the areas targeted by this assay, and inadequate number of viral copies(<138 copies/mL). A negative result must be combined with clinical observations, patient history, and epidemiological information. The expected result is Negative.  Fact Sheet for Patients:  BloggerCourse.com  Fact Sheet for Healthcare Providers:   SeriousBroker.it  This test is no t yet approved or cleared by the [place] FDA and  has been authorized for detection and/or diagnosis of SARS-CoV-2 by FDA under an Emergency Use Authorization (EUA). This EUA will remain  in effect (meaning this test can be used) for the duration of the COVID-19 declaration under Section 564(b)(1) of the Act, 21 U.S.C.section 360bbb-3(b)(1), unless the authorization is terminated  or revoked sooner.       Influenza A by PCR NEGATIVE NEGATIVE Final   Influenza B by PCR NEGATIVE NEGATIVE Final    Comment: (NOTE) The Xpert Xpress SARS-CoV-2/FLU/RSV plus assay is intended as an aid in the diagnosis of influenza from Nasopharyngeal swab specimens and should not be used as a sole basis for treatment. Nasal washings and aspirates are unacceptable for Xpert Xpress SARS-CoV-2/FLU/RSV testing.  Fact Sheet for Patients: BloggerCourse.com  Fact Sheet for Healthcare Providers: SeriousBroker.it  This test is not yet approved or cleared by the [place] FDA and has been authorized for detection and/or diagnosis of SARS-CoV-2 by FDA under an Emergency Use Authorization (EUA). This EUA will remain in effect (meaning this test can be used) for the duration of the COVID-19 declaration under Section 564(b)(1) of the Act, 21 U.S.C. section 360bbb-3(b)(1), unless the  authorization is terminated or revoked.     Resp Syncytial Virus by PCR POSITIVE (A) NEGATIVE Final    Comment: (NOTE) Fact Sheet for Patients: BloggerCourse.comhttps://www.fda.gov/media/152166/download  Fact Sheet for Healthcare Providers: SeriousBroker.ithttps://www.fda.gov/media/152162/download  This test is not yet approved or cleared by the [place] FDA and has been authorized for detection and/or diagnosis of SARS-CoV-2 by FDA under an Emergency Use Authorization (EUA). This EUA will remain in effect (meaning this test can be used)  for the duration of the COVID-19 declaration under Section 564(b)(1) of the Act, 21 U.S.C. section 360bbb-3(b)(1), unless the authorization is terminated or revoked.  Performed at [facility], [address] [place], [place] [address]     Labs: CBC: Recent Labs  Lab 09/10/22 0515 09/11/22 0513 09/12/22 0201 09/12/22 1719 09/12/22 1720 09/13/22 0540  WBC 9.8 8.6 8.5  --   --  11.0*  HGB 11.0* 11.6* 10.6* 12.2* 11.9* 11.6*  HCT 35.6* 36.9* 34.4* 36.0* 35.0* 37.5*  MCV 94.9 95.1 94.0  --   --  94.2  PLT 277 269 246  --   --  233   Basic Metabolic Panel: Recent Labs  Lab 09/10/22 0515 09/11/22 0513 09/12/22 0201 09/12/22 1719 09/12/22 1720 09/13/22 0540 09/14/22 0629 09/15/22 0630  NA 134* 135 139 139 139 139 137 137  K 4.9 4.3 3.5 3.6 3.6 3.3* 3.6 3.5  CL 98 100 101  --   --  101 101 102  CO2 27 25 28   --   --  29 26 26   GLUCOSE 146* 144* 153*  --   --  148* 128* 128*  BUN 89* 85* 73*  --   --  56* 47* 44*  CREATININE 2.48* 2.10* 1.97*  --   --  1.82* 1.58* 1.73*  CALCIUM 8.7* 8.8* 8.4*  --   --  8.5* 8.6* 8.5*  MG 2.5* 2.5*  --   --   --   --   --   --    Liver Function Tests: No results for input(s): "AST", "ALT", "ALKPHOS", "BILITOT", "PROT", "ALBUMIN" in the last 168 hours. CBG: Recent Labs  Lab 09/14/22 1149 09/14/22 1639 09/14/22 2056 09/15/22 0801 09/15/22 1153  GLUCAP 238* 116* 161* 140* 171*    Discharge time spent: less than 30 minutes.  Signed: Hollice EspySendil K Jenny Omdahl, MD [facility] 09/15/2022

## 2022-09-15 NOTE — Progress Notes (Signed)
Progress Note  Patient Name: Bradley [name] Date of Encounter: 09/15/2022  Primary Cardiologist: Mariah Milling  Subjective   No chest pain.  Dyspnea significantly improved, now only noting SOB if wakes up from a "dream." Renal function stable.  Documented urine output 11.9 L for the admission.  Inpatient Medications    Scheduled Meds:  amiodarone  100 mg Oral Daily   apixaban  2.5 mg Oral BID   aspirin EC  81 mg Oral Daily   atorvastatin  80 mg Oral QHS   brinzolamide  1 drop Both Eyes TID   And   brimonidine  1 drop Both Eyes TID   Chlorhexidine Gluconate Cloth  6 each Topical Daily   collagenase   Topical Daily   cyanocobalamin  1,000 mcg Oral Daily   empagliflozin  10 mg Oral Daily   hydrALAZINE  10 mg Oral Q8H   insulin aspart  0-5 Units Subcutaneous QHS   insulin aspart  0-9 Units Subcutaneous TID WC   insulin aspart  3 Units Subcutaneous TID WC   isosorbide mononitrate  30 mg Oral Daily   metoprolol succinate  50 mg Oral Daily   mometasone-formoterol  2 puff Inhalation BID   pantoprazole  40 mg Oral Daily   polyethylene glycol  17 g Oral BID   sodium chloride flush  3 mL Intravenous Q12H   sodium chloride flush  3 mL Intravenous Q12H   timolol  1 drop Both Eyes QHS   torsemide  40 mg Oral Daily   Travoprost (BAK Free)  1 drop Both Eyes QHS   Continuous Infusions:  sodium chloride Stopped (08/24/22 1617)   sodium chloride     sodium chloride     PRN Meds: sodium chloride, sodium chloride, sodium chloride, acetaminophen, albuterol, dextromethorphan-guaiFENesin, ipratropium-albuterol, methocarbamol, nitroGLYCERIN, ondansetron (ZOFRAN) IV, oxyCODONE, sodium chloride flush, sodium chloride flush   Vital Signs    Vitals:   09/14/22 2001 09/15/22 0033 09/15/22 0429 09/15/22 0801  BP: (!) 96/48 (!) 116/50 (!) 109/51 (!) 121/56  Pulse: 62 61 60 66  Resp: 18 19 18 18   Temp: 98.7 F (37.1 C) 98.6 F (37 C) 98.4 F (36.9 C) 98.7 F (37.1 C)  TempSrc:    Oral  SpO2:  97% 98% 97% 99%  Weight:      Height:        Intake/Output Summary (Last 24 hours) at 09/15/2022 1123 Last data filed at 09/15/2022 0801 Gross per 24 hour  Intake 480 ml  Output 2575 ml  Net -2095 ml    Filed Weights   09/12/22 0832 09/13/22 0731 09/14/22 0433  Weight: 77.2 kg 76.8 kg 72.9 kg    Telemetry    Sinus rhythm - Personally Reviewed  ECG    No new tracings - Personally Reviewed  Physical Exam   GEN: No acute distress.   Neck: No JVD. Cardiac: RRR, no murmurs, rubs, or gallops.  Respiratory: Mildly diminished along the bases bilaterally.  GI: Soft, nontender, non-distended.   MS: No edema; No deformity. Wrapped.  Neuro:  Alert and oriented x 3; Nonfocal.  Psych: Normal affect.  Labs    Chemistry Recent Labs  Lab 09/13/22 0540 09/14/22 0629 09/15/22 0630  NA 139 137 137  K 3.3* 3.6 3.5  CL 101 101 102  CO2 29 26 26   GLUCOSE 148* 128* 128*  BUN 56* 47* 44*  CREATININE 1.82* 1.58* 1.73*  CALCIUM 8.5* 8.6* 8.5*  GFRNONAA 37* 44* 39*  ANIONGAP 9 10 9  Hematology Recent Labs  Lab 09/11/22 0513 09/12/22 0201 09/12/22 1719 09/12/22 1720 09/13/22 0540  WBC 8.6 8.5  --   --  11.0*  RBC 3.88* 3.66*  --   --  3.98*  HGB 11.6* 10.6* 12.2* 11.9* 11.6*  HCT 36.9* 34.4* 36.0* 35.0* 37.5*  MCV 95.1 94.0  --   --  94.2  MCH 29.9 29.0  --   --  29.1  MCHC 31.4 30.8  --   --  30.9  RDW 22.0* 21.8*  --   --  21.6*  PLT 269 246  --   --  233     Cardiac EnzymesNo results for input(s): "TROPONINI" in the last 168 hours. No results for input(s): "TROPIPOC" in the last 168 hours.   BNP Recent Labs  Lab 09/13/22 0540  BNP >4,500.0*      DDimer No results for input(s): "DDIMER" in the last 168 hours.   Radiology     Cardiac Studies   RHC 09/12/2022:   Hemodynamic findings consistent with mild pulmonary hypertension.   ~ Mild-Moderate Pulm HTN: PAP 59/12 - mean 32 mmHg; PCWP 19/26- 16 mmHg AoSat 96%l PA Sat 58%:  CO-CI (Fick)  4.23-2.43; thermodilution is 3.11-1.57    Plan -- Defer ongoing management to CHF Team __________  2D echo 09/07/2022:  1. Left ventricular ejection fraction, by estimation, is 35 to 40%. The  left ventricle has moderately decreased function. The left ventricle  demonstrates global hypokinesis. There is moderate asymmetric left  ventricular hypertrophy of the basal-septal  segment.   2. Right ventricular systolic function is moderately reduced. The right  ventricular size is mildly enlarged.   3. The inferior vena cava is dilated in size with <50% respiratory  variability, suggesting right atrial pressure of 15 mmHg.   Comparison(s): No significant change from prior study.  __________  2D echo 08/28/2022: 1. Left ventricular ejection fraction, by estimation, is 40 to 45%. The  left ventricle has mildly decreased function. The left ventricle  demonstrates global hypokinesis with severe hypokinesis of the bassal  anterior and anteroseptal wall, and inferior  wall. Left ventricular diastolic parameters are consistent with Grade II  diastolic dysfunction (pseudonormalization). There is the interventricular  septum is flattened in systole and diastole, consistent with right  ventricular pressure and volume  overload.   2. Right ventricular systolic function is normal. The right ventricular  size is normal. There is moderately elevated pulmonary artery systolic  pressure. The estimated right ventricular systolic pressure is 29.5 mmHg.   3. Left atrial size was mildly dilated.   4. Right atrial size was mildly dilated.   5. The mitral valve is normal in structure. Mild to moderate mitral valve  regurgitation. No evidence of mitral stenosis.   6. Tricuspid valve regurgitation is mild to moderate.   7. The aortic valve is tricuspid. There is moderate calcification of the  aortic valve. Aortic valve regurgitation is mild to moderate. Mild aortic  valve stenosis by Aortic valve mean gradient  measures 14.3 mmHg. Aortic  valve Vmax measures 2.54  m/s.Moderate stenosis by Aortic valve area, by VTI measures 0.89 cm.   8. The inferior vena cava is normal in size with greater than 50%  respiratory variability, suggesting right atrial pressure of 3 mmHg.   Patient Profile     82 y.o. male with history of CAD s/p CABG, HFrEF, moderate aortic stenosis, persistent atrial fibrillation, CKD stage III, hypertension, diabetes, PAD, who was admitted 08/22/22 for acute on  chronic systolic heart failure and acute kidney injury. 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   Assessment & Plan    1. Acute on chronic systolic heart failure exacerbation  - TTE on 09/07/22 with LVEF 35%-40% - Continue hydralazine 10mg  TID, imdur 30mg , toprol 50, Jardiance 10 mg, and torsemide 40 mg daily.  - RHC 09/12/2022 with moderate to severely reduced cardiac index, mildly elevated PA mean and normal filling pressures; at this time will avoid home inotropes due to multiple co-morbidities and frailty.  - Possible addition of digoxin outpatient if renal function continues to improve. - AHF will consider addition of ARB outpatient. Plan for follow up in 1 week in HF clinic at [facility].    2. AKI on CKD  - Baseline sCr of 1.38 on 03/14/22; up to 2.8 on admission  - Reviewed [facility] records; sCr at low as 1.5 there, however, 1.9 at discharge (08/09/22) - Net negative 11L since admit. sCr stable - Appreciate nephrology assistance  3. CAD s/p CABG  - Angiography at [facility] w/ patent LIMA-LAD, SVG-OM and severe SVG-R PLV disease not amenable to PCI; otherwise, severe native vessel CAD that was stable.    4. Paroxysmal atrial fibrillation  - Maintaining sinus rhythm - Amiodarone 100mg  daily, metop succinate 50mg  and apixaban (currently meets reduced dosing criteria)   5. Moderate aortic  stenosis - Evaluated recently at [facility] for low flow low gradient; dobutamine stress echo negative for LFLG. Continue outpatient monitoring.    4. T2DM - A1C 7.8 - Followed by internal medicine    For questions or updates, please contact [place] Please consult [email or web address] for contact info under Cardiology/STEMI.    Signed, Christell Faith, PA-C [place] Pager: [phone] 09/15/2022, 11:23 AM

## 2022-09-15 NOTE — Progress Notes (Signed)
Mobility Specialist - Progress Note   09/15/22 1110  Mobility  Activity Ambulated with assistance to bathroom;Stood at bedside;Dangled on edge of bed  Level of Assistance Standby assist, set-up cues, supervision of patient - no hands on  Assistive Device Front wheel walker  Distance Ambulated (ft) 10 ft  Activity Response Tolerated well  Mobility Referral Yes  $Mobility charge 1 Mobility    Pt supine in bed on RA upon arrival. Pt STS and ambulates to restroom Supervision. Pt left in restroom with education on how to call for assistance when ready to return to bed.   Bradley [name]  Mobility Specialist  09/15/22 11:14 AM

## 2022-09-15 NOTE — Progress Notes (Signed)
[facility]  ROUNDING NOTE   Subjective:   Patient laying in bed. Alert and oriented Denies pain and discomfort Remains on room air  Lower extremity edema continues to improve. Legs wrapped   Feels that torsemide is working.   Objective:  Vital signs in last 24 hours:  Temp:  [97.8 F (36.6 C)-98.7 F (37.1 C)] 98.7 F (37.1 C) (01/21 0801) Pulse Rate:  [60-66] 66 (01/21 0801) Resp:  [18-19] 18 (01/21 0801) BP: (96-121)/(48-56) 121/56 (01/21 0801) SpO2:  [97 %-100 %] 99 % (01/21 0801)  Weight change:  Filed Weights   09/12/22 0832 09/13/22 0731 09/14/22 0433  Weight: 77.2 kg 76.8 kg 72.9 kg    Intake/Output: I/O last 3 completed shifts: In: 363 [P.O.:360; I.V.:3] Out: 2775 [Urine:2775]   Intake/Output this shift:  Total I/O In: 480 [P.O.:480] Out: 500 [Urine:500]  Physical Exam: General: NAD  Head: Normocephalic, atraumatic.   Eyes: Anicteric  Lungs:  Clear to auscultation normal effort, room air  Heart: Regular rate and rhythm  Abdomen:  Soft, nontender, nondistended  Extremities: Trace peripheral edema, legs wrapped   Neurologic: Alert and oriented, moving all four extremities  Skin: No lesions  Access: None    Basic Metabolic Panel: Recent Labs  Lab 09/10/22 0515 09/11/22 0513 09/12/22 0201 09/12/22 1719 09/12/22 1720 09/13/22 0540 09/14/22 0629 09/15/22 0630  NA 134* 135 139 139 139 139 137 137  K 4.9 4.3 3.5 3.6 3.6 3.3* 3.6 3.5  CL 98 100 101  --   --  101 101 102  CO2 27 25 28   --   --  29 26 26   GLUCOSE 146* 144* 153*  --   --  148* 128* 128*  BUN 89* 85* 73*  --   --  56* 47* 44*  CREATININE 2.48* 2.10* 1.97*  --   --  1.82* 1.58* 1.73*  CALCIUM 8.7* 8.8* 8.4*  --   --  8.5* 8.6* 8.5*  MG 2.5* 2.5*  --   --   --   --   --   --      Liver Function Tests: No results for input(s): "AST", "ALT", "ALKPHOS", "BILITOT", "PROT", "ALBUMIN" in the last 168 hours. No results for input(s): "LIPASE", "AMYLASE" in the last 168  hours. No results for input(s): "AMMONIA" in the last 168 hours.  CBC: Recent Labs  Lab 09/10/22 0515 09/11/22 0513 09/12/22 0201 09/12/22 1719 09/12/22 1720 09/13/22 0540  WBC 9.8 8.6 8.5  --   --  11.0*  HGB 11.0* 11.6* 10.6* 12.2* 11.9* 11.6*  HCT 35.6* 36.9* 34.4* 36.0* 35.0* 37.5*  MCV 94.9 95.1 94.0  --   --  94.2  PLT 277 269 246  --   --  233     Cardiac Enzymes: No results for input(s): "CKTOTAL", "CKMB", "CKMBINDEX", "TROPONINI" in the last 168 hours.  BNP: Invalid input(s): "POCBNP"  CBG: Recent Labs  Lab 09/14/22 0740 09/14/22 1149 09/14/22 1639 09/14/22 2056 09/15/22 0801  GLUCAP 133* 238* 116* 161* 140*     Microbiology: Results for orders placed or performed during the hospital encounter of 08/22/22  Blood culture (routine x 2)     Status: None   Collection Time: 08/22/22  4:20 PM   Specimen: BLOOD  Result Value Ref Range Status   Specimen Description BLOOD BLOOD RIGHT WRIST  Final   Special Requests   Final    BOTTLES DRAWN AEROBIC AND ANAEROBIC Blood Culture adequate volume   Culture  Final    NO GROWTH 5 DAYS Performed at [facility], [address] [place], [place] [address]    Report Status 08/27/2022 FINAL  Final  Blood culture (routine x 2)     Status: None   Collection Time: 08/22/22  4:20 PM   Specimen: BLOOD  Result Value Ref Range Status   Specimen Description BLOOD BLOOD RIGHT FOREARM  Final   Special Requests   Final    BOTTLES DRAWN AEROBIC AND ANAEROBIC Blood Culture adequate volume   Culture   Final    NO GROWTH 5 DAYS Performed at [facility], [address] [place], [place] [address]    Report Status 08/27/2022 FINAL  Final  Resp panel by RT-PCR (RSV, Flu A&B, Covid)     Status: Abnormal   Collection Time: 09/02/22  4:51 PM  Result Value Ref Range Status   SARS Coronavirus 2 by RT PCR NEGATIVE NEGATIVE Final    Comment: (NOTE) SARS-CoV-2 target nucleic acids are NOT DETECTED.  The  SARS-CoV-2 RNA is generally detectable in upper respiratory specimens during the acute phase of infection. The lowest concentration of SARS-CoV-2 viral copies this assay can detect is 138 copies/mL. A negative result does not preclude SARS-Cov-2 infection and should not be used as the sole basis for treatment or other patient management decisions. A negative result may occur with  improper specimen collection/handling, submission of specimen other than nasopharyngeal swab, presence of viral mutation(s) within the areas targeted by this assay, and inadequate number of viral copies(<138 copies/mL). A negative result must be combined with clinical observations, patient history, and epidemiological information. The expected result is Negative.  Fact Sheet for Patients:  EntrepreneurPulse.com.au  Fact Sheet for Healthcare Providers:  IncredibleEmployment.be  This test is no t yet approved or cleared by the [place] FDA and  has been authorized for detection and/or diagnosis of SARS-CoV-2 by FDA under an Emergency Use Authorization (EUA). This EUA will remain  in effect (meaning this test can be used) for the duration of the COVID-19 declaration under Section 564(b)(1) of the Act, 21 U.S.C.section 360bbb-3(b)(1), unless the authorization is terminated  or revoked sooner.       Influenza A by PCR NEGATIVE NEGATIVE Final   Influenza B by PCR NEGATIVE NEGATIVE Final    Comment: (NOTE) The Xpert Xpress SARS-CoV-2/FLU/RSV plus assay is intended as an aid in the diagnosis of influenza from Nasopharyngeal swab specimens and should not be used as a sole basis for treatment. Nasal washings and aspirates are unacceptable for Xpert Xpress SARS-CoV-2/FLU/RSV testing.  Fact Sheet for Patients: EntrepreneurPulse.com.au  Fact Sheet for Healthcare Providers: IncredibleEmployment.be  This test is not yet approved or  cleared by the [place] FDA and has been authorized for detection and/or diagnosis of SARS-CoV-2 by FDA under an Emergency Use Authorization (EUA). This EUA will remain in effect (meaning this test can be used) for the duration of the COVID-19 declaration under Section 564(b)(1) of the Act, 21 U.S.C. section 360bbb-3(b)(1), unless the authorization is terminated or revoked.     Resp Syncytial Virus by PCR POSITIVE (A) NEGATIVE Final    Comment: (NOTE) Fact Sheet for Patients: EntrepreneurPulse.com.au  Fact Sheet for Healthcare Providers: IncredibleEmployment.be  This test is not yet approved or cleared by the [place] FDA and has been authorized for detection and/or diagnosis of SARS-CoV-2 by FDA under an Emergency Use Authorization (EUA). This EUA will remain in effect (meaning this test can be used) for the duration of  the COVID-19 declaration under Section 564(b)(1) of the Act, 21 U.S.C. section 360bbb-3(b)(1), unless the authorization is terminated or revoked.  Performed at [facility], [address] [place], [place] [address]     Coagulation Studies: No results for input(s): "LABPROT", "INR" in the last 72 hours.   Urinalysis: No results for input(s): "COLORURINE", "LABSPEC", "PHURINE", "GLUCOSEU", "HGBUR", "BILIRUBINUR", "KETONESUR", "PROTEINUR", "UROBILINOGEN", "NITRITE", "LEUKOCYTESUR" in the last 72 hours.  Invalid input(s): "APPERANCEUR"    Imaging: No results found.   Medications:    sodium chloride Stopped (08/24/22 1617)   sodium chloride     sodium chloride      amiodarone  100 mg Oral Daily   apixaban  2.5 mg Oral BID   aspirin EC  81 mg Oral Daily   atorvastatin  80 mg Oral QHS   brinzolamide  1 drop Both Eyes TID   And   brimonidine  1 drop Both Eyes TID   Chlorhexidine Gluconate Cloth  6 each Topical Daily   collagenase   Topical Daily   cyanocobalamin  1,000 mcg Oral Daily    empagliflozin  10 mg Oral Daily   hydrALAZINE  10 mg Oral Q8H   insulin aspart  0-5 Units Subcutaneous QHS   insulin aspart  0-9 Units Subcutaneous TID WC   insulin aspart  3 Units Subcutaneous TID WC   isosorbide mononitrate  30 mg Oral Daily   metoprolol succinate  50 mg Oral Daily   mometasone-formoterol  2 puff Inhalation BID   pantoprazole  40 mg Oral Daily   polyethylene glycol  17 g Oral BID   sodium chloride flush  3 mL Intravenous Q12H   sodium chloride flush  3 mL Intravenous Q12H   timolol  1 drop Both Eyes QHS   torsemide  40 mg Oral Daily   Travoprost (BAK Free)  1 drop Both Eyes QHS   sodium chloride, sodium chloride, sodium chloride, acetaminophen, albuterol, dextromethorphan-guaiFENesin, ipratropium-albuterol, methocarbamol, nitroGLYCERIN, ondansetron (ZOFRAN) IV, oxyCODONE, sodium chloride flush, sodium chloride flush  Assessment/ Plan:  [name] is a 82 y.o.  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  Baseline GFR appears to be between 35 and 37.  Renal function has returned to baseline. Good urine output recorded. IV contrast exposure  on 09/12/22. Will monitor for any undesired effects. Creatinine 1.7. UOP 2.7L yesterday     Lab Results  Component Value Date   CREATININE 1.73 (H) 09/15/2022   CREATININE 1.58 (H) 09/14/2022   CREATININE 1.82 (H) 09/13/2022    Intake/Output Summary (Last 24 hours) at 09/15/2022 1121 Last data filed at 09/15/2022 0801 Gross per 24 hour  Intake 480 ml  Output  2575 ml  Net -2095 ml    2. .  Acute on chronic systolic heart failure.  Echo completed on 08/28/2022 shows EF 40 to 45% with grade 2 diastolic dysfunction.  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  Also notes aortic calcification with stenosis.  Cardiology following.  Repeat echo 09/07/2022 shows EF 35 to 40%. 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 Transitioned to torsemide     LOS: [place] 1/21/202411:21 AM

## 2022-09-16 DIAGNOSIS — I5043 Acute on chronic combined systolic (congestive) and diastolic (congestive) heart failure: Secondary | ICD-10-CM | POA: Diagnosis not present

## 2022-09-16 LAB — BASIC METABOLIC PANEL
Anion gap: 11 (ref 5–15)
BUN: 42 mg/dL — ABNORMAL HIGH (ref 8–23)
CO2: 27 mmol/L (ref 22–32)
Calcium: 9 mg/dL (ref 8.9–10.3)
Chloride: 100 mmol/L (ref 98–111)
Creatinine, Ser: 1.8 mg/dL — ABNORMAL HIGH (ref 0.61–1.24)
GFR, Estimated: 37 mL/min — ABNORMAL LOW (ref >60)
Glucose, Bld: 160 mg/dL — ABNORMAL HIGH (ref 70–99)
Potassium: 4.9 mmol/L (ref 3.5–5.1)
Sodium: 138 mmol/L (ref 135–145)

## 2022-09-16 LAB — GLUCOSE, CAPILLARY
Glucose-Capillary: 118 mg/dL — ABNORMAL HIGH (ref 70–99)
Glucose-Capillary: 183 mg/dL — ABNORMAL HIGH (ref 70–99)

## 2022-09-16 MED ORDER — TORSEMIDE 40 MG PO TABS: 40.0000 mg | ORAL_TABLET | Freq: Every day | ORAL | 2 refills | Status: DC

## 2022-09-16 MED ORDER — MOMETASONE FURO-FORMOTEROL FUM 200-5 MCG/ACT IN AERO: 2.0000 | INHALATION_SPRAY | Freq: Two times a day (BID) | RESPIRATORY_TRACT | 2 refills | Status: DC

## 2022-09-16 MED ORDER — EMPAGLIFLOZIN 10 MG PO TABS: 10.0000 mg | ORAL_TABLET | Freq: Every day | ORAL | 1 refills | Status: DC

## 2022-09-16 MED ORDER — ISOSORBIDE MONONITRATE ER 30 MG PO TB24: 30.0000 mg | ORAL_TABLET | Freq: Every day | ORAL | 2 refills | Status: DC

## 2022-09-16 MED ORDER — ASPIRIN 81 MG PO TBEC: 81.0000 mg | DELAYED_RELEASE_TABLET | Freq: Every day | ORAL | 12 refills | Status: DC

## 2022-09-16 MED ORDER — HYDRALAZINE HCL 10 MG PO TABS: 5.0000 mg | ORAL_TABLET | Freq: Three times a day (TID) | ORAL | 2 refills | Status: DC

## 2022-09-16 NOTE — Progress Notes (Signed)
[facility]  ROUNDING NOTE   Subjective:   Patient laying in bed. Reports some shortness of breath this morning Remains on room air Lower extremity edema greatly improved since admission.   Objective:  Vital signs in last 24 hours:  Temp:  [98.1 F (36.7 C)-98.7 F (37.1 C)] 98.7 F (37.1 C) (01/22 0804) Pulse Rate:  [59-74] 74 (01/22 0804) Resp:  [16-20] 20 (01/22 0804) BP: (106-138)/(54-61) 138/61 (01/22 0804) SpO2:  [97 %-100 %] 100 % (01/22 0804)  Weight change:  Filed Weights   09/12/22 0832 09/13/22 0731 09/14/22 0433  Weight: 77.2 kg 76.8 kg 72.9 kg    Intake/Output: I/O last 3 completed shifts: In: 480 [P.O.:480] Out: 1800 [Urine:1800]   Intake/Output this shift:  Total I/O In: 240 [P.O.:240] Out: 600 [Urine:600]  Physical Exam: General: NAD  Head: Normocephalic, atraumatic.   Eyes: Anicteric  Lungs:  Clear to auscultation normal effort, room air  Heart: Regular rate and rhythm  Abdomen:  Soft, nontender, nondistended  Extremities: Trace peripheral edema, legs wrapped   Neurologic: Alert and oriented, moving all four extremities  Skin: No lesions  Access: None    Basic Metabolic Panel: Recent Labs  Lab 09/10/22 0515 09/11/22 0513 09/12/22 0201 09/12/22 1719 09/12/22 1720 09/13/22 0540 09/14/22 0629 09/15/22 0630 09/16/22 0830  NA 134* 135 139   < > 139 139 137 137 138  K 4.9 4.3 3.5   < > 3.6 3.3* 3.6 3.5 4.9  CL 98 100 101  --   --  101 101 102 100  CO2 27 25 28   --   --  29 26 26 27   GLUCOSE 146* 144* 153*  --   --  148* 128* 128* 160*  BUN 89* 85* 73*  --   --  56* 47* 44* 42*  CREATININE 2.48* 2.10* 1.97*  --   --  1.82* 1.58* 1.73* 1.80*  CALCIUM 8.7* 8.8* 8.4*  --   --  8.5* 8.6* 8.5* 9.0  MG 2.5* 2.5*  --   --   --   --   --   --   --    < > = values in this interval not displayed.     Liver Function Tests: No results for input(s): "AST", "ALT", "ALKPHOS", "BILITOT", "PROT", "ALBUMIN" in the last 168 hours. No  results for input(s): "LIPASE", "AMYLASE" in the last 168 hours. No results for input(s): "AMMONIA" in the last 168 hours.  CBC: Recent Labs  Lab 09/10/22 0515 09/11/22 0513 09/12/22 0201 09/12/22 1719 09/12/22 1720 09/13/22 0540  WBC 9.8 8.6 8.5  --   --  11.0*  HGB 11.0* 11.6* 10.6* 12.2* 11.9* 11.6*  HCT 35.6* 36.9* 34.4* 36.0* 35.0* 37.5*  MCV 94.9 95.1 94.0  --   --  94.2  PLT 277 269 246  --   --  233     Cardiac Enzymes: No results for input(s): "CKTOTAL", "CKMB", "CKMBINDEX", "TROPONINI" in the last 168 hours.  BNP: Invalid input(s): "POCBNP"  CBG: Recent Labs  Lab 09/15/22 0801 09/15/22 1153 09/15/22 1631 09/15/22 2124 09/16/22 0758  GLUCAP 140* 171* 181* 140* 118*     Microbiology: Results for orders placed or performed during the hospital encounter of 08/22/22  Blood culture (routine x 2)     Status: None   Collection Time: 08/22/22  4:20 PM   Specimen: BLOOD  Result Value Ref Range Status   Specimen Description BLOOD BLOOD RIGHT WRIST  Final   Special Requests  Final    BOTTLES DRAWN AEROBIC AND ANAEROBIC Blood Culture adequate volume   Culture   Final    NO GROWTH 5 DAYS Performed at [facility], [address] [place], [place] [address]    Report Status 08/27/2022 FINAL  Final  Blood culture (routine x 2)     Status: None   Collection Time: 08/22/22  4:20 PM   Specimen: BLOOD  Result Value Ref Range Status   Specimen Description BLOOD BLOOD RIGHT FOREARM  Final   Special Requests   Final    BOTTLES DRAWN AEROBIC AND ANAEROBIC Blood Culture adequate volume   Culture   Final    NO GROWTH 5 DAYS Performed at [facility], [address] [place], [place] [address]    Report Status 08/27/2022 FINAL  Final  Resp panel by RT-PCR (RSV, Flu A&B, Covid)     Status: Abnormal   Collection Time: 09/02/22  4:51 PM  Result Value Ref Range Status   SARS Coronavirus 2 by RT PCR NEGATIVE NEGATIVE Final    Comment:  (NOTE) SARS-CoV-2 target nucleic acids are NOT DETECTED.  The SARS-CoV-2 RNA is generally detectable in upper respiratory specimens during the acute phase of infection. The lowest concentration of SARS-CoV-2 viral copies this assay can detect is 138 copies/mL. A negative result does not preclude SARS-Cov-2 infection and should not be used as the sole basis for treatment or other patient management decisions. A negative result may occur with  improper specimen collection/handling, submission of specimen other than nasopharyngeal swab, presence of viral mutation(s) within the areas targeted by this assay, and inadequate number of viral copies(<138 copies/mL). A negative result must be combined with clinical observations, patient history, and epidemiological information. The expected result is Negative.  Fact Sheet for Patients:  EntrepreneurPulse.com.au  Fact Sheet for Healthcare Providers:  IncredibleEmployment.be  This test is no t yet approved or cleared by the [place] FDA and  has been authorized for detection and/or diagnosis of SARS-CoV-2 by FDA under an Emergency Use Authorization (EUA). This EUA will remain  in effect (meaning this test can be used) for the duration of the COVID-19 declaration under Section 564(b)(1) of the Act, 21 U.S.C.section 360bbb-3(b)(1), unless the authorization is terminated  or revoked sooner.       Influenza A by PCR NEGATIVE NEGATIVE Final   Influenza B by PCR NEGATIVE NEGATIVE Final    Comment: (NOTE) The Xpert Xpress SARS-CoV-2/FLU/RSV plus assay is intended as an aid in the diagnosis of influenza from Nasopharyngeal swab specimens and should not be used as a sole basis for treatment. Nasal washings and aspirates are unacceptable for Xpert Xpress SARS-CoV-2/FLU/RSV testing.  Fact Sheet for Patients: EntrepreneurPulse.com.au  Fact Sheet for Healthcare  Providers: IncredibleEmployment.be  This test is not yet approved or cleared by the [place] FDA and has been authorized for detection and/or diagnosis of SARS-CoV-2 by FDA under an Emergency Use Authorization (EUA). This EUA will remain in effect (meaning this test can be used) for the duration of the COVID-19 declaration under Section 564(b)(1) of the Act, 21 U.S.C. section 360bbb-3(b)(1), unless the authorization is terminated or revoked.     Resp Syncytial Virus by PCR POSITIVE (A) NEGATIVE Final    Comment: (NOTE) Fact Sheet for Patients: EntrepreneurPulse.com.au  Fact Sheet for Healthcare Providers: IncredibleEmployment.be  This test is not yet approved or cleared by the [place] FDA and has been authorized for detection and/or diagnosis of SARS-CoV-2 by FDA under an Emergency Use  Authorization (EUA). This EUA will remain in effect (meaning this test can be used) for the duration of the COVID-19 declaration under Section 564(b)(1) of the Act, 21 U.S.C. section 360bbb-3(b)(1), unless the authorization is terminated or revoked.  Performed at [facility], [address] [place], [place] [address]     Coagulation Studies: No results for input(s): "LABPROT", "INR" in the last 72 hours.   Urinalysis: No results for input(s): "COLORURINE", "LABSPEC", "PHURINE", "GLUCOSEU", "HGBUR", "BILIRUBINUR", "KETONESUR", "PROTEINUR", "UROBILINOGEN", "NITRITE", "LEUKOCYTESUR" in the last 72 hours.  Invalid input(s): "APPERANCEUR"    Imaging: No results found.   Medications:    sodium chloride Stopped (08/24/22 1617)   sodium chloride     sodium chloride      amiodarone  100 mg Oral Daily   apixaban  2.5 mg Oral BID   aspirin EC  81 mg Oral Daily   atorvastatin  80 mg Oral QHS   brinzolamide  1 drop Both Eyes TID   And   brimonidine  1 drop Both Eyes TID   collagenase   Topical Daily    cyanocobalamin  1,000 mcg Oral Daily   empagliflozin  10 mg Oral Daily   hydrALAZINE  10 mg Oral Q8H   insulin aspart  0-5 Units Subcutaneous QHS   insulin aspart  0-9 Units Subcutaneous TID WC   insulin aspart  3 Units Subcutaneous TID WC   isosorbide mononitrate  30 mg Oral Daily   metoprolol succinate  50 mg Oral Daily   mometasone-formoterol  2 puff Inhalation BID   pantoprazole  40 mg Oral Daily   polyethylene glycol  17 g Oral BID   potassium chloride  10 mEq Oral Daily   sodium chloride flush  3 mL Intravenous Q12H   sodium chloride flush  3 mL Intravenous Q12H   spironolactone  25 mg Oral Daily   timolol  1 drop Both Eyes QHS   torsemide  40 mg Oral Daily   Travoprost (BAK Free)  1 drop Both Eyes QHS   sodium chloride, sodium chloride, sodium chloride, acetaminophen, albuterol, dextromethorphan-guaiFENesin, ipratropium-albuterol, methocarbamol, nitroGLYCERIN, ondansetron (ZOFRAN) IV, oxyCODONE, sodium chloride flush, sodium chloride flush  Assessment/ Plan:  Mr. Eryk Beavers is a 82 y.o.  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  Baseline GFR appears to be between 35 and 37.  Renal function remains at baseline. Adequate urine output noted. Will need follow up in our office at discharge.     Lab Results  Component Value Date   CREATININE 1.80 (H) 09/16/2022   CREATININE 1.73 (H) 09/15/2022   CREATININE 1.58 (H) 09/14/2022    Intake/Output Summary (Last 24 hours) at 09/16/2022 1139 Last data filed at  09/16/2022 0900 Gross per 24 hour  Intake 240 ml  Output 1000 ml  Net -760 ml    2. .  Acute on chronic systolic heart failure.  Echo completed on 08/28/2022 shows EF 40 to 45% with grade 2 diastolic dysfunction.  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  Also notes aortic calcification with stenosis.  Cardiology following.  Repeat echo 09/07/2022 shows EF 35 to 40%. RHC completed on 09/12/22, mild to moderate pulmonary hypertension noted. Remains on torsemide 40mg  daily and started on Spironolactone 25mg  daily yesterday.     LOS: 25 [place] [place] 1/22/202411:39 AM

## 2022-09-16 NOTE — Progress Notes (Signed)
Patient alert and oriented, vss, no complaints of pain.  To be escorted out of hospital via wheelchair by volunteers.

## 2022-09-16 NOTE — Progress Notes (Signed)
Patient ID: Bradley [name], male   DOB: 11/27/1940, 82 y.o.   MRN: [id number]     Advanced Heart Failure Rounding Note  PCP-Cardiologist: Ida Rogue, MD   Subjective:    No complaints today.  Ready to go home.  No labs today, creatinine 1.73.   Objective:   Weight Range: 72.9 kg Body mass index is 21.8 kg/m.   Vital Signs:   Temp:  [98.1 F (36.7 C)-98.7 F (37.1 C)] 98.7 F (37.1 C) (01/22 0804) Pulse Rate:  [59-74] 74 (01/22 0804) Resp:  [16-20] 20 (01/22 0804) BP: (106-138)/(54-61) 138/61 (01/22 0804) SpO2:  [97 %-100 %] 100 % (01/22 0804) Last BM Date : 09/14/22  Weight change: Filed Weights   09/12/22 0832 09/13/22 0731 09/14/22 0433  Weight: 77.2 kg 76.8 kg 72.9 kg    Intake/Output:   Intake/Output Summary (Last 24 hours) at 09/16/2022 0819 Last data filed at 09/15/2022 1700 Gross per 24 hour  Intake --  Output 400 ml  Net -400 ml      Physical Exam    General:  Well appearing. No resp difficulty HEENT: Normal Neck: Supple. JVP 9-10 cm. Carotids 2+ bilat; no bruits. No lymphadenopathy or thyromegaly appreciated. Cor: PMI nondisplaced. Regular rate & rhythm. No rubs, gallops or murmurs. Lungs: Clear Abdomen: Soft, nontender, nondistended. No hepatosplenomegaly. No bruits or masses. Good bowel sounds. Extremities: No cyanosis, clubbing, rash. Legs wrapped.  Neuro: Alert & orientedx3, cranial nerves grossly intact. moves all 4 extremities w/o difficulty. Affect pleasant   Telemetry   NSR 60s (personally reviewed)   Labs    CBC No results for input(s): "WBC", "NEUTROABS", "HGB", "HCT", "MCV", "PLT" in the last 72 hours. Basic Metabolic Panel Recent Labs    09/14/22 0629 09/15/22 0630  NA 137 137  K 3.6 3.5  CL 101 102  CO2 26 26  GLUCOSE 128* 128*  BUN 47* 44*  CREATININE 1.58* 1.73*  CALCIUM 8.6* 8.5*   Liver Function Tests No results for input(s): "AST", "ALT", "ALKPHOS", "BILITOT", "PROT", "ALBUMIN" in the last 72 hours. No results  for input(s): "LIPASE", "AMYLASE" in the last 72 hours. Cardiac Enzymes No results for input(s): "CKTOTAL", "CKMB", "CKMBINDEX", "TROPONINI" in the last 72 hours.  BNP: BNP (last 3 results) Recent Labs    09/01/22 0616 09/05/22 0344 09/13/22 0540  BNP 3,682.6* 3,373.6* >4,500.0*    ProBNP (last 3 results) No results for input(s): "PROBNP" in the last 8760 hours.   D-Dimer No results for input(s): "DDIMER" in the last 72 hours. Hemoglobin A1C No results for input(s): "HGBA1C" in the last 72 hours. Fasting Lipid Panel No results for input(s): "CHOL", "HDL", "LDLCALC", "TRIG", "CHOLHDL", "LDLDIRECT" in the last 72 hours. Thyroid Function Tests No results for input(s): "TSH", "T4TOTAL", "T3FREE", "THYROIDAB" in the last 72 hours.  Invalid input(s): "FREET3"  Other results:   Imaging    No results found.   Medications:     Scheduled Medications:  amiodarone  100 mg Oral Daily   apixaban  2.5 mg Oral BID   aspirin EC  81 mg Oral Daily   atorvastatin  80 mg Oral QHS   brinzolamide  1 drop Both Eyes TID   And   brimonidine  1 drop Both Eyes TID   Chlorhexidine Gluconate Cloth  6 each Topical Daily   collagenase   Topical Daily   cyanocobalamin  1,000 mcg Oral Daily   empagliflozin  10 mg Oral Daily   hydrALAZINE  10 mg Oral Q8H   insulin  aspart  0-5 Units Subcutaneous QHS   insulin aspart  0-9 Units Subcutaneous TID WC   insulin aspart  3 Units Subcutaneous TID WC   isosorbide mononitrate  30 mg Oral Daily   metoprolol succinate  50 mg Oral Daily   mometasone-formoterol  2 puff Inhalation BID   pantoprazole  40 mg Oral Daily   polyethylene glycol  17 g Oral BID   potassium chloride  10 mEq Oral Daily   sodium chloride flush  3 mL Intravenous Q12H   sodium chloride flush  3 mL Intravenous Q12H   spironolactone  25 mg Oral Daily   timolol  1 drop Both Eyes QHS   torsemide  40 mg Oral Daily   Travoprost (BAK Free)  1 drop Both Eyes QHS    Infusions:   sodium chloride Stopped (08/24/22 1617)   sodium chloride     sodium chloride      PRN Medications: sodium chloride, sodium chloride, sodium chloride, acetaminophen, albuterol, dextromethorphan-guaiFENesin, ipratropium-albuterol, methocarbamol, nitroGLYCERIN, ondansetron (ZOFRAN) IV, oxyCODONE, sodium chloride flush, sodium chloride flush    Assessment/Plan   1. CAD with angina/non-STEMI: Troponin peak 2500 on August 28, 2022.  Recent ischemic workup at outside facility, cardiac catheterization 12/23 showing stable severe three-vessel disease, grafts patent with no interventional options.  No chest pain currently.  - On ASA 81 - Continue atorvastatin 80 daily.   2. Acute on chronic systolic CHF: Ischemic cardiomyopathy, complicated by CKD stage 3. Echo 1/24 with EF 35-40%, moderately reduced RV systolic function. [place] 1/18 with mean RA 5, PA 59/12, mean PCWP 16, CI 2.4 Fick/1.57 thermo.  Suspect Fick more likely accurate. Creatinine 1.7 yesterday, none today.  He does have some JVD today but is comfortable. - Continue torsemide 40 mg daily for home.  Would get BMET today to make sure creatinine is stable.  He was on Lasix 40 mg daily prior to admission.  - Continue Jardiance 10 mg daily.  - Continue current hydralazine/Imdur, consider increase as outpatient.  - With rising creatinine yesterday, will not add digoxin.  - Continue Toprol XL 50 mg daily.  - Continue spironolactone 25 mg daily.  Lasix, Jardiance, spironolactone held, with improved renal function these have been restarted 3. RSV infection this admission: Supportive care.  4. Aortic stenosis: Moderate by echo this admission.  5. PAD with critical limb ischemia: Nonhealing wounds on the feet.  Status post right external iliac stenting this admission.  Occlusion of right SFA, not a good candidate for bypass with general anesthesia at this time though this could be considered as outpatient for worsening lower extremity ischemic  disease - On aspirin, Eliquis, statin 6. Paroxysmal atrial fibrillation: NSR today.  - Continue Eliquis 2.5 bid.  - Continue amiodarone 100 mg daily for maintenance of NSR.   7. AKI on CKD stage 3: Creatinine trending higher yesterday.  Watch closely with meds.  - Would get BMET today.    Plan for home today on current cardiac meds, would get BMET prior.  Will arrange CHF clinic followup.   Length of Stay: [place], MD  09/16/2022, 8:19 AM  Advanced Heart Failure Team Pager [phone] (M-F; 7a - 5p)  Please contact [facility] for night-coverage after hours (5p -7a ) and weekends on [email or web address]

## 2022-09-16 NOTE — Progress Notes (Signed)
Patient doing well.  Medication sent [facility] well as to the [facility] pharmacy.  Follow-up creatinine today at 1.8.  Cleared by cardiology and patient be discharged home with home health PT, OT and RN.

## 2022-09-16 NOTE — TOC Transition Note (Signed)
Transition of Care [facility]) - CM/SW Discharge Note   Patient Details  Name: Bradley [name] MRN: [id number] Date of Birth: Oct 24, 1940  Transition of Care [facility]) CM/SW Contact:  Tiburcio Bash, LCSW Phone Number: 09/16/2022, 11:11 AM   Clinical Narrative:     Patient to discharge home today. Patient has been set up with [facility], per [name] with [facility] they have [place] authorization for Home Health at dc.   Patient will go home to nieces house at discharge [place], [place] [address]    No further dc needs identified at this time.   Final next level of care: Home w Home Health Services Barriers to Discharge: No Barriers Identified   Patient Goals and CMS Choice CMS Medicare.gov Compare Post Acute Care list provided to:: Patient Choice offered to / list presented to : Patient  Discharge Placement                         Discharge Plan and Services Additional resources added to the After Visit Summary for     Discharge Planning Services: CM Consult                      HH Arranged: PT, RN [facility] Agency: [place] ([place]) Date [place]: 09/16/22      Social Determinants of Health ([place]) Interventions SDOH Screenings   Food Insecurity: No Food Insecurity (08/23/2022)  Housing: Low Risk  (08/23/2022)  Transportation Needs: No Transportation Needs (08/23/2022)  Utilities: Not At Risk (08/23/2022)  Tobacco Use: Medium Risk (09/13/2022)     Readmission Risk Interventions     No data to display

## 2022-09-19 ENCOUNTER — Encounter: Payer: No Typology Code available for payment source | Admitting: Cardiology

## 2022-09-20 ENCOUNTER — Other Ambulatory Visit
Admission: RE | Admit: 2022-09-20 | Discharge: 2022-09-20 | Disposition: A | Discharge status: Home / Self Care | Payer: No Typology Code available for payment source | Source: Ambulatory Visit | Attending: Cardiology | Admitting: Cardiology

## 2022-09-20 ENCOUNTER — Ambulatory Visit: Payer: No Typology Code available for payment source | Attending: Cardiology | Admitting: Cardiology

## 2022-09-20 ENCOUNTER — Other Ambulatory Visit (HOSPITAL_COMMUNITY): Payer: Self-pay

## 2022-09-20 ENCOUNTER — Encounter: Payer: Self-pay | Admitting: Cardiology

## 2022-09-20 VITALS — BP 114/79 | HR 62 | Resp 16 | Wt 164.4 lb

## 2022-09-20 DIAGNOSIS — I130 Hypertensive heart and chronic kidney disease with heart failure and stage 1 through stage 4 chronic kidney disease, or unspecified chronic kidney disease: Secondary | ICD-10-CM | POA: Insufficient documentation

## 2022-09-20 DIAGNOSIS — I480 Paroxysmal atrial fibrillation: Secondary | ICD-10-CM

## 2022-09-20 DIAGNOSIS — R079 Chest pain, unspecified: Secondary | ICD-10-CM | POA: Diagnosis not present

## 2022-09-20 DIAGNOSIS — I739 Peripheral vascular disease, unspecified: Secondary | ICD-10-CM | POA: Diagnosis not present

## 2022-09-20 DIAGNOSIS — E785 Hyperlipidemia, unspecified: Secondary | ICD-10-CM | POA: Diagnosis not present

## 2022-09-20 DIAGNOSIS — Z7901 Long term (current) use of anticoagulants: Secondary | ICD-10-CM | POA: Diagnosis not present

## 2022-09-20 DIAGNOSIS — I5022 Chronic systolic (congestive) heart failure: Secondary | ICD-10-CM | POA: Insufficient documentation

## 2022-09-20 DIAGNOSIS — Z951 Presence of aortocoronary bypass graft: Secondary | ICD-10-CM | POA: Insufficient documentation

## 2022-09-20 DIAGNOSIS — R0602 Shortness of breath: Secondary | ICD-10-CM | POA: Diagnosis present

## 2022-09-20 DIAGNOSIS — Z7984 Long term (current) use of oral hypoglycemic drugs: Secondary | ICD-10-CM | POA: Insufficient documentation

## 2022-09-20 DIAGNOSIS — I252 Old myocardial infarction: Secondary | ICD-10-CM | POA: Insufficient documentation

## 2022-09-20 DIAGNOSIS — I25119 Atherosclerotic heart disease of native coronary artery with unspecified angina pectoris: Secondary | ICD-10-CM | POA: Diagnosis not present

## 2022-09-20 DIAGNOSIS — Z79899 Other long term (current) drug therapy: Secondary | ICD-10-CM | POA: Diagnosis not present

## 2022-09-20 DIAGNOSIS — E1122 Type 2 diabetes mellitus with diabetic chronic kidney disease: Secondary | ICD-10-CM | POA: Insufficient documentation

## 2022-09-20 DIAGNOSIS — I5032 Chronic diastolic (congestive) heart failure: Secondary | ICD-10-CM | POA: Diagnosis present

## 2022-09-20 DIAGNOSIS — I255 Ischemic cardiomyopathy: Secondary | ICD-10-CM | POA: Diagnosis not present

## 2022-09-20 DIAGNOSIS — N183 Chronic kidney disease, stage 3 unspecified: Secondary | ICD-10-CM | POA: Diagnosis not present

## 2022-09-20 LAB — COMPREHENSIVE METABOLIC PANEL
ALT: 65 U/L — ABNORMAL HIGH (ref 0–44)
AST: 64 U/L — ABNORMAL HIGH (ref 15–41)
Albumin: 4.1 g/dL (ref 3.5–5.0)
Alkaline Phosphatase: 140 U/L — ABNORMAL HIGH (ref 38–126)
Anion gap: 16 — ABNORMAL HIGH (ref 5–15)
BUN: 49 mg/dL — ABNORMAL HIGH (ref 8–23)
CO2: 26 mmol/L (ref 22–32)
Calcium: 9.5 mg/dL (ref 8.9–10.3)
Chloride: 98 mmol/L (ref 98–111)
Creatinine, Ser: 2.13 mg/dL — ABNORMAL HIGH (ref 0.61–1.24)
GFR, Estimated: 31 mL/min — ABNORMAL LOW (ref >60)
Glucose, Bld: 223 mg/dL — ABNORMAL HIGH (ref 70–99)
Potassium: 3.7 mmol/L (ref 3.5–5.1)
Sodium: 140 mmol/L (ref 135–145)
Total Bilirubin: 2 mg/dL — ABNORMAL HIGH (ref 0.3–1.2)
Total Protein: 7.1 g/dL (ref 6.5–8.1)

## 2022-09-20 LAB — TSH: TSH: 2.543 u[IU]/mL (ref 0.350–4.500)

## 2022-09-20 LAB — BRAIN NATRIURETIC PEPTIDE: B Natriuretic Peptide: >4500 pg/mL — ABNORMAL HIGH (ref 0.0–100.0)

## 2022-09-20 MED ORDER — EMPAGLIFLOZIN 25 MG PO TABS: ORAL_TABLET | ORAL | 2 refills | Status: DC

## 2022-09-20 MED ORDER — HYDRALAZINE HCL 10 MG PO TABS: 10.0000 mg | ORAL_TABLET | Freq: Three times a day (TID) | ORAL | 2 refills | Status: DC

## 2022-09-20 NOTE — Patient Instructions (Signed)
Medication Changes:  Increase Hydralazine to 10 mg Three times a day   Lab Work:  Labs done today, we will call you for abnormal results  Testing/Procedures:  none  Referrals:  You have been referred to [facility], they will call you for an appointment  Special Instructions // Education:  Do the following things EVERYDAY: Weigh yourself in the morning before breakfast. Write it down and keep it in a log. Take your medicines as prescribed Eat low salt foods--Limit salt (sodium) to 2000 mg per day.  Stay as active as you can everyday Limit all fluids for the day to less than 2 liters   Follow-Up in: 3-4 weeks    If you have any questions or concerns before your next appointment please send [place] a message through mychart or call our office at [phone] Monday-Friday 8 am-5 pm.   If you have an urgent need after hours on the weekend please call your Primary Cardiologist or the [facility] in [place] at [phone].

## 2022-09-22 NOTE — Progress Notes (Signed)
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 male with HFrEF, HTN, HLD, T2DM, CAD s/p CABG, PAD, paroxysmal atrial fibrillation (DCCV 11/15/21), and moderate AS.    NSTEMI in 11/23, admitted to [facility].  Found to have 99% stenosis at the anastomosis of SVG-distal RCA, unable to cross lesion for PCI.  LIMA-LAD and SVG-OM3 were patent.   He presented to [facility] on 08/22/22 with complaints of SOB and LE edema which had worsened over the past several weeks. In the ER he was found to have a BNP>4500, K 5.7, sCr of 2.84. He was diuresed with IV lasix with some improvement in renal function initially. On 12/30, he underwent debridement of a R foot wound with LE angiography done on 1/02 with stent placement to the right external iliac artery. On 1/8 due to worsening SOB, he was found to be positive for RSV. Through his hospitalization his renal function fluctuated with a sCr as high as 3.36 on 09/08/21. Echo this admission showed EF 35-40% with moderate asymmetric basal septal hypertrophy, moderately decreased RV systolic function, probably moderate low flow low gradient aortic stenosis.  He was diuresed and after a long admission was discharged home.   Patient is now at home with his wife.  He is mildly short of breath with dressing and bathing.  He has had rare mild chest pain with heavier exertion (2/10, central).  He walks with a walker around the house, does not get short of breath walking in the house.  No orthopnea/PND.  PT is coming to his house.  No leg/foot pain.  He needs vascular followup.   ECG (personally reviewed): NSR, right axis deviation, LVH, inferior Qs  Labs (1/24): K 4.9, creatinine 1.8  PMH: 1. CAD: s/p CABG.  - NSTEMI 11/23: LHC at [facility] showed patent LIMA-LAD and SVG-OM3 but 99% stenosis at the anastomosis of SVG-distal RCA, unable to cross lesion so not intervened upon.  2. Chronic systolic CHF: Ischemic cardiomyopathy.  - Echo (1/24): EF 35-40% with moderate  asymmetric basal septal hypertrophy, moderately decreased RV systolic function, probably moderate low flow low gradient aortic stenosis. - RHC (1/24): mean RA 5, PA 59/12 mean 32, mean PCWP 16, CI 2.43 (Fick), 1.57 (Thermo).  PVR 3.7 WU using Fick.  3. CKD stage 3: Baseline creatinine around 1.9.  4. HTN 5. Type 2 diabetes 6. Atrial fibrillation: Paroxysmal. DCCV to NSR in 3/23.  7. PAD: Stent to right external iliac artery in 1/24.   Social History   Socioeconomic History   Marital status: Widowed    Spouse name: Not on file   Number of children: Not on file   Years of education: Not on file   Highest education level: Not on file  Occupational History   Not on file  Tobacco Use   Smoking status: Former   Smokeless tobacco: Never  Vaping Use   Vaping Use: Never used  Substance and Sexual Activity   Alcohol use: Never   Drug use: Never   Sexual activity: Not on file  Other Topics Concern   Not on file  Social History Narrative   Not on file   Social Determinants of Health   Financial Resource Strain: Not on file  Food Insecurity: No Food Insecurity (08/23/2022)   Hunger Vital Sign    Worried About Running Out of Food in the Last Year: Never true    Ran Out of Food in the Last Year: Never true  Transportation Needs: No Transportation  Needs (08/23/2022)   PRAPARE - Hydrologist (Medical): No    Lack of Transportation (Non-Medical): No  Physical Activity: Not on file  Stress: Not on file  Social Connections: Not on file  Intimate Partner Violence: Not At Risk (08/23/2022)   Humiliation, Afraid, Rape, and Kick questionnaire    Fear of Current or Ex-Partner: No    Emotionally Abused: No    Physically Abused: No    Sexually Abused: No   Family History  Problem Relation Age of Onset   CAD Mother    Urinary tract infection Father    Hip fracture Father    Cancer Sister    Cancer Brother    ROS: All systems reviewed and negative except as  per HPI.   General: NAD Neck: No JVD, no thyromegaly or thyroid nodule.  Lungs: Clear to auscultation bilaterally with normal respiratory effort. CV: Nondisplaced PMI.  Heart regular S1/S2, no S3/S4, 2/6 early SEM with clear S2.  No peripheral edema.  No carotid bruit. Difficult to palpate pedal pulses.  Abdomen: Soft, nontender, no hepatosplenomegaly, no distention.  Skin: Intact without lesions or rashes.  Neurologic: Alert and oriented x 3.  Psych: Normal affect. Extremities: No clubbing or cyanosis.  HEENT: Normal.   Assessment/Plan: 1. Chronic systolic CHF: Ischemic cardiomyopathy.  Echo in 1/24 showed EF 35-40% with moderate asymmetric basal septal hypertrophy, moderately decreased RV systolic function, probably moderate low flow low gradient aortic stenosis.  He does not look volume overloaded on exam.  NYHA class 3 symptoms, suspect primarily due to deconditioning at this point.   - Continue Jardiance 10 mg daily. - Increase hydralazine to 10 mg tid and continue Imdur 30 daily.  May be able to transition this to ARNI/ARB if creatinine remains stable.  - Continue Toprol XL 50 mg daily.  - Continue spironolactone 25 mg daily.  - Looks euvolemic, continue torsemide 40 mg daily. BMET/BNP today.  2. Atrial fibrillation: Paroxysmal.  He is in NSR today.   - Continue amiodarone 100 mg daily.  Check LFTs and TSH, will need regular eye exam.  - Continue apixaban, lower dose given age > 80 and creatinine > 1.5.  3. CKD: Stage 3.  Recent hospitalization with AKI with creatinine > 3, most recent 1.9.  - BMET today.  - Sees a nephrologist at the [place].  4. Vascular: S/p right external iliac PCI 1/24.  - Refer for followup with vascular.  5. CAD: s/p CABG.  NSTEMI 11/23 with cath showing patent LIMA-LAD and SVG-OM3 but 99% stenosis at the anastomosis of SVG-distal RCA, unable to cross lesion so not intervened upon. He has mild angina periodically.  Continue medical management.  - Imdur + Toprol  XL for angina.  - ASA 81 with recent NSTEMI, can eventually stop given Eliquis use.  - Continue atorvastatin.   Followup in 3-4 wks with APP.   He is going to talk to the [place] about his followup appointments.  He is assigned to a [place] PCP in [facility] (???) which makes no sense.  It is a significant hardship for him to get there and he needs PCP and specialists in [place] close to home.   Loralie Champagne 09/22/2022

## 2022-09-23 ENCOUNTER — Telehealth (HOSPITAL_COMMUNITY): Payer: Self-pay

## 2022-09-23 ENCOUNTER — Other Ambulatory Visit (HOSPITAL_COMMUNITY): Payer: Self-pay

## 2022-09-23 DIAGNOSIS — I5032 Chronic diastolic (congestive) heart failure: Secondary | ICD-10-CM

## 2022-09-23 NOTE — Telephone Encounter (Signed)
Patient aware and agreeable. Labs ordered and scheduled. 

## 2022-09-24 ENCOUNTER — Telehealth: Payer: Self-pay

## 2022-09-24 NOTE — Telephone Encounter (Signed)
Erica with Home health called to report that for the past 2 days patients blood pressure has been declining since increasing the Hydralazine to 10mg  tid. Today(09/24/22) it was 86/42 yesterday(09/23/2022) it was 90/48. Please advise.  CB#[phone]

## 2022-09-24 NOTE — Telephone Encounter (Signed)
Can go back to 5 tid.

## 2022-09-25 ENCOUNTER — Encounter: Payer: No Typology Code available for payment source | Admitting: Family

## 2022-09-25 MED ORDER — HYDRALAZINE HCL 10 MG PO TABS: 5.0000 mg | ORAL_TABLET | Freq: Three times a day (TID) | ORAL | 1 refills | Status: DC

## 2022-09-25 NOTE — Telephone Encounter (Signed)
Danae Chen advised and verbalized understanding, med list updated to reflect changes.

## 2022-09-25 NOTE — Addendum Note (Signed)
Addended by: Shela Nevin R on: 1/63/8466 09:08 AM   Modules accepted: Orders

## 2022-10-01 ENCOUNTER — Other Ambulatory Visit (HOSPITAL_COMMUNITY): Payer: No Typology Code available for payment source

## 2022-10-01 ENCOUNTER — Telehealth (HOSPITAL_COMMUNITY): Payer: Self-pay

## 2022-10-01 NOTE — Telephone Encounter (Signed)
Danna Hefty from [facility] called and stated Bradley [name] died on 10/18/2022 and you were listed as the physician to sign the death certificate for him. She has you set up in the system to go in and sign. Case number is [id number].  Do you need anything further for this?

## 2022-10-14 ENCOUNTER — Encounter: Payer: No Typology Code available for payment source | Admitting: Family

## 2022-10-25 DEATH — deceased

## 2022-11-04 ENCOUNTER — Encounter (INDEPENDENT_AMBULATORY_CARE_PROVIDER_SITE_OTHER): Payer: Self-pay

## 2022-11-04 ENCOUNTER — Encounter (INDEPENDENT_AMBULATORY_CARE_PROVIDER_SITE_OTHER): Payer: Self-pay | Admitting: Vascular Surgery
